# Patient Record
Sex: Female | Born: 1950
Health system: Southern US, Community
[De-identification: ages and names within clinical notes are randomized; demographics above are authoritative.]

## PROBLEM LIST (undated history)

## (undated) DIAGNOSIS — F419 Anxiety disorder, unspecified: Secondary | ICD-10-CM

## (undated) DIAGNOSIS — I10 Essential (primary) hypertension: Secondary | ICD-10-CM

## (undated) DIAGNOSIS — J449 Chronic obstructive pulmonary disease, unspecified: Secondary | ICD-10-CM

## (undated) DIAGNOSIS — R059 Cough, unspecified: Secondary | ICD-10-CM

## (undated) DIAGNOSIS — R011 Cardiac murmur, unspecified: Secondary | ICD-10-CM

## (undated) DIAGNOSIS — I70223 Atherosclerosis of native arteries of extremities with rest pain, bilateral legs: Secondary | ICD-10-CM

## (undated) DIAGNOSIS — E785 Hyperlipidemia, unspecified: Secondary | ICD-10-CM

## (undated) DIAGNOSIS — Z87891 Personal history of nicotine dependence: Secondary | ICD-10-CM

## (undated) DIAGNOSIS — R42 Dizziness and giddiness: Secondary | ICD-10-CM

## (undated) DIAGNOSIS — Z9889 Other specified postprocedural states: Secondary | ICD-10-CM

## (undated) DIAGNOSIS — I4891 Unspecified atrial fibrillation: Secondary | ICD-10-CM

## (undated) DIAGNOSIS — K219 Gastro-esophageal reflux disease without esophagitis: Secondary | ICD-10-CM

## (undated) DIAGNOSIS — I712 Thoracic aortic aneurysm, without rupture, unspecified: Secondary | ICD-10-CM

## (undated) DIAGNOSIS — I441 Atrioventricular block, second degree: Secondary | ICD-10-CM

## (undated) DIAGNOSIS — F32A Depression, unspecified: Secondary | ICD-10-CM

## (undated) DIAGNOSIS — I350 Nonrheumatic aortic (valve) stenosis: Secondary | ICD-10-CM

## (undated) DIAGNOSIS — I6523 Occlusion and stenosis of bilateral carotid arteries: Secondary | ICD-10-CM

## (undated) DIAGNOSIS — I5032 Chronic diastolic (congestive) heart failure: Secondary | ICD-10-CM

## (undated) DIAGNOSIS — I714 Abdominal aortic aneurysm, without rupture, unspecified: Secondary | ICD-10-CM

## (undated) DIAGNOSIS — I739 Peripheral vascular disease, unspecified: Secondary | ICD-10-CM

## (undated) DIAGNOSIS — I471 Supraventricular tachycardia, unspecified: Secondary | ICD-10-CM

## (undated) DIAGNOSIS — R0600 Dyspnea, unspecified: Secondary | ICD-10-CM

## (undated) DIAGNOSIS — I2510 Atherosclerotic heart disease of native coronary artery without angina pectoris: Secondary | ICD-10-CM

## (undated) DIAGNOSIS — R05 Cough: Secondary | ICD-10-CM

## (undated) DIAGNOSIS — Z8679 Personal history of other diseases of the circulatory system: Secondary | ICD-10-CM

## (undated) DIAGNOSIS — K8020 Calculus of gallbladder without cholecystitis without obstruction: Secondary | ICD-10-CM

## (undated) DIAGNOSIS — I4819 Other persistent atrial fibrillation: Secondary | ICD-10-CM

## (undated) DIAGNOSIS — F329 Major depressive disorder, single episode, unspecified: Secondary | ICD-10-CM

## (undated) HISTORY — DX: Hyperlipidemia, unspecified: E78.5

## (undated) HISTORY — DX: Essential (primary) hypertension: I10

## (undated) HISTORY — PX: TUBAL LIGATION: SHX77

## (undated) HISTORY — DX: Personal history of nicotine dependence: Z87.891

## (undated) HISTORY — PX: DILATION AND CURETTAGE OF UTERUS: SHX78

---

## 1968-05-02 HISTORY — PX: BREAST BIOPSY: SHX20

## 1978-05-02 HISTORY — PX: ABDOMINAL HYSTERECTOMY: SHX81

## 2003-03-15 LAB — HM COLONOSCOPY: HM COLON: NORMAL

## 2014-03-14 ENCOUNTER — Ambulatory Visit (INDEPENDENT_AMBULATORY_CARE_PROVIDER_SITE_OTHER): Payer: Medicare Other | Admitting: Internal Medicine

## 2014-03-14 ENCOUNTER — Encounter (INDEPENDENT_AMBULATORY_CARE_PROVIDER_SITE_OTHER): Payer: Self-pay

## 2014-03-14 ENCOUNTER — Encounter: Payer: Self-pay | Admitting: *Deleted

## 2014-03-14 ENCOUNTER — Encounter: Payer: Self-pay | Admitting: Internal Medicine

## 2014-03-14 VITALS — BP 150/90 | HR 66 | Temp 98.3°F | Ht 66.0 in | Wt 148.0 lb

## 2014-03-14 DIAGNOSIS — R1314 Dysphagia, pharyngoesophageal phase: Secondary | ICD-10-CM | POA: Diagnosis not present

## 2014-03-14 DIAGNOSIS — R011 Cardiac murmur, unspecified: Secondary | ICD-10-CM

## 2014-03-14 DIAGNOSIS — R142 Eructation: Secondary | ICD-10-CM | POA: Diagnosis not present

## 2014-03-14 DIAGNOSIS — I10 Essential (primary) hypertension: Secondary | ICD-10-CM | POA: Diagnosis not present

## 2014-03-14 DIAGNOSIS — R109 Unspecified abdominal pain: Secondary | ICD-10-CM

## 2014-03-14 DIAGNOSIS — R030 Elevated blood-pressure reading, without diagnosis of hypertension: Secondary | ICD-10-CM

## 2014-03-14 DIAGNOSIS — R42 Dizziness and giddiness: Secondary | ICD-10-CM

## 2014-03-14 DIAGNOSIS — Z1211 Encounter for screening for malignant neoplasm of colon: Secondary | ICD-10-CM

## 2014-03-14 DIAGNOSIS — IMO0001 Reserved for inherently not codable concepts without codable children: Secondary | ICD-10-CM | POA: Insufficient documentation

## 2014-03-14 DIAGNOSIS — R1310 Dysphagia, unspecified: Secondary | ICD-10-CM | POA: Insufficient documentation

## 2014-03-14 DIAGNOSIS — F17200 Nicotine dependence, unspecified, uncomplicated: Secondary | ICD-10-CM | POA: Diagnosis not present

## 2014-03-14 LAB — POCT URINALYSIS DIPSTICK
Bilirubin, UA: NEGATIVE
Blood, UA: NEGATIVE
Glucose, UA: NEGATIVE
Ketones, UA: NEGATIVE
Leukocytes, UA: NEGATIVE
Nitrite, UA: NEGATIVE
PH UA: 7
PROTEIN UA: NEGATIVE
SPEC GRAV UA: 1.015
UROBILINOGEN UA: 1

## 2014-03-14 LAB — CBC WITH DIFFERENTIAL/PLATELET
BASOS ABS: 0.1 10*3/uL (ref 0.0–0.1)
BASOS PCT: 0.6 % (ref 0.0–3.0)
EOS ABS: 0.2 10*3/uL (ref 0.0–0.7)
Eosinophils Relative: 1.7 % (ref 0.0–5.0)
HCT: 47.2 % — ABNORMAL HIGH (ref 36.0–46.0)
Hemoglobin: 15.6 g/dL — ABNORMAL HIGH (ref 12.0–15.0)
Lymphocytes Relative: 32.8 % (ref 12.0–46.0)
Lymphs Abs: 3.3 10*3/uL (ref 0.7–4.0)
MCHC: 33.1 g/dL (ref 30.0–36.0)
MCV: 91.7 fl (ref 78.0–100.0)
Monocytes Absolute: 0.8 10*3/uL (ref 0.1–1.0)
Monocytes Relative: 7.9 % (ref 3.0–12.0)
NEUTROS PCT: 57 % (ref 43.0–77.0)
Neutro Abs: 5.7 10*3/uL (ref 1.4–7.7)
Platelets: 225 10*3/uL (ref 150.0–400.0)
RBC: 5.15 Mil/uL — ABNORMAL HIGH (ref 3.87–5.11)
RDW: 13.8 % (ref 11.5–15.5)
WBC: 10 10*3/uL (ref 4.0–10.5)

## 2014-03-14 LAB — LIPID PANEL
CHOL/HDL RATIO: 5
Cholesterol: 193 mg/dL (ref 0–200)
HDL: 37.5 mg/dL — AB (ref >39.00)
LDL CALC: 142 mg/dL — AB (ref 0–99)
NonHDL: 155.5
Triglycerides: 67 mg/dL (ref 0.0–149.0)
VLDL: 13.4 mg/dL (ref 0.0–40.0)

## 2014-03-14 LAB — COMPREHENSIVE METABOLIC PANEL
ALBUMIN: 3.4 g/dL — AB (ref 3.5–5.2)
ALK PHOS: 66 U/L (ref 39–117)
ALT: 17 U/L (ref 0–35)
AST: 18 U/L (ref 0–37)
BUN: 10 mg/dL (ref 6–23)
CO2: 28 mEq/L (ref 19–32)
Calcium: 9.1 mg/dL (ref 8.4–10.5)
Chloride: 105 mEq/L (ref 96–112)
Creatinine, Ser: 0.8 mg/dL (ref 0.4–1.2)
GFR: 79.76 mL/min (ref >60.00)
Glucose, Bld: 88 mg/dL (ref 70–99)
POTASSIUM: 4.8 meq/L (ref 3.5–5.1)
Sodium: 140 mEq/L (ref 135–145)
Total Bilirubin: 0.6 mg/dL (ref 0.2–1.2)
Total Protein: 6.4 g/dL (ref 6.0–8.3)

## 2014-03-14 LAB — VITAMIN D 25 HYDROXY (VIT D DEFICIENCY, FRACTURES): VITD: 19.92 ng/mL — ABNORMAL LOW (ref 30.00–100.00)

## 2014-03-14 LAB — TSH: TSH: 3.2 u[IU]/mL (ref 0.35–4.50)

## 2014-03-14 LAB — MICROALBUMIN / CREATININE URINE RATIO
Creatinine,U: 65.6 mg/dL
MICROALB UR: 0.1 mg/dL (ref 0.0–1.9)
Microalb Creat Ratio: 0.2 mg/g (ref 0.0–30.0)

## 2014-03-14 LAB — HEMOGLOBIN A1C: Hgb A1c MFr Bld: 5.7 % (ref 4.6–6.5)

## 2014-03-14 LAB — T4, FREE: Free T4: 0.79 ng/dL (ref 0.60–1.60)

## 2014-03-14 LAB — HM MAMMOGRAPHY

## 2014-03-14 MED ORDER — CENTRUM ADULTS PO TABS: 1.0000 | ORAL_TABLET | Freq: Every day | ORAL | Status: AC

## 2014-03-14 MED ORDER — ASPIRIN EC 81 MG PO TBEC: 81.0000 mg | DELAYED_RELEASE_TABLET | Freq: Every day | ORAL | Status: DC

## 2014-03-14 NOTE — Progress Notes (Signed)
Subjective:    Patient ID: Tracy Bell, female    DOB: December 17, 1950, 63 y.o.   MRN: [id number]  HPI 63YO female presents to establish care.  Concerned about spells. Feels like throat is closing in on her. Feels nauseous and lightheaded. Has belching during these episodes and passes gas. Feels better with belching. These have been ongoing for years. Was evaluated in the past by cardiology, but did not have stress test. Evaluation was normal. No heart racing or chest pain. These events occur sometimes every day, but sometimes less frequently.  Also having some pain in left flank since October. Intense at times. Comes and goes. Sometimes wakes from sleep. No urinary symptoms. No GI symptoms, except for occasional trouble swallowing.  Notes some stress within her marriage.    Review of Systems  Constitutional: Negative for fever, chills, appetite change, fatigue and unexpected weight change.  HENT: Positive for trouble swallowing. Negative for congestion, postnasal drip, sore throat and voice change.   Eyes: Negative for visual disturbance.  Respiratory: Negative for shortness of breath.   Cardiovascular: Negative for chest pain and leg swelling.  Gastrointestinal: Positive for nausea. Negative for vomiting, abdominal pain, diarrhea, constipation, abdominal distention and anal bleeding.  Genitourinary: Positive for flank pain (left). Negative for dysuria, urgency, frequency, hematuria, difficulty urinating and pelvic pain.  Musculoskeletal: Negative for arthralgias.  Skin: Negative for color change and rash.  Neurological: Positive for light-headedness. Negative for tremors, seizures, syncope, weakness and numbness.  Hematological: Negative for adenopathy. Does not bruise/bleed easily.  Psychiatric/Behavioral: Negative for sleep disturbance and dysphoric mood. The patient is nervous/anxious.        Objective:    BP 150/90 mmHg  Pulse 66  Temp(Src) 98.3 F (36.8 C) (Oral)  Ht 5'  6" (1.676 m)  Wt 148 lb (67.132 kg)  BMI 23.90 kg/m2  SpO2 98% Physical Exam  Constitutional: She is oriented to person, place, and time. She appears well-developed and well-nourished. No distress.  HENT:  Head: Normocephalic and atraumatic.  Right Ear: External ear normal.  Left Ear: External ear normal.  Nose: Nose normal.  Mouth/Throat: Oropharynx is clear and moist. No oropharyngeal exudate.  Eyes: Conjunctivae and EOM are normal. Pupils are equal, round, and reactive to light. Right eye exhibits no discharge.  Neck: Normal range of motion. Neck supple. No thyromegaly present.  Cardiovascular: Normal rate, regular rhythm and intact distal pulses.  Exam reveals no gallop and no friction rub.   Murmur (right chest wall) heard.  Systolic murmur is present  Pulmonary/Chest: Effort normal. No respiratory distress. She has no wheezes. She has no rales.  Abdominal: Soft. Bowel sounds are normal. She exhibits no distension and no mass. There is no tenderness. There is no rebound and no guarding.  Musculoskeletal: Normal range of motion. She exhibits no edema or tenderness.  Lymphadenopathy:    She has no cervical adenopathy.  Neurological: She is alert and oriented to person, place, and time. No cranial nerve deficit. Coordination normal.  Skin: Skin is warm and dry. No rash noted. She is not diaphoretic. No erythema. No pallor.  Psychiatric: She has a normal mood and affect. Her behavior is normal. Judgment and thought content normal.          Assessment & Plan:   Problem List Items Addressed This Visit      Unprioritized   Belching    Recurrent episodes of belching. Will set up GI evaluation for upper endoscopy.    Relevant Orders  Ambulatory referral to Gastroenterology   Dysphagia, pharyngoesophageal phase    Noted by pt with both liquids and solids. Will set up GI evaluation for possible endoscopy.    Elevated BP    Check renal function with labs today. Repeat BP  check in 4 weeks.    Heart murmur    Right sided systolic murmur concerning for aortic stenosis. Will set up ECHO.     Relevant Orders      2D Echocardiogram without contrast   Left flank pain    Intermittent left flank pain. Will set up CT abdomen without contrast to look for stone. UA today.    Relevant Orders      POCT Urinalysis Dipstick (Completed)      CT Abdomen Pelvis Wo Contrast   Lightheadedness - Primary    Recent episodes of lightheadness, belching. EKG today. Will check CBC, CMP, TSH with labs. Will set up GI evaluation for possible upper endoscopy given persistent belching and some occasional dysphagia. Follow up in 4 weeks.    Relevant Orders      T4, free      TSH      CBC with Differential      Comprehensive metabolic panel      Lipid panel      Hemoglobin A1c      Microalbumin / creatinine urine ratio      Vit D  25 hydroxy (rtn osteoporosis monitoring)      EKG 12-Lead (Completed)   Special screening for malignant neoplasms, colon   Relevant Orders      Ambulatory referral to Gastroenterology   Tobacco use disorder    Encouraged smoking cessation. Pt not interested in quitting. Will set up screening chest CT to evaluate for lung cancer.    Relevant Orders      CT CHEST LOW DOSE SCREENING W/O CM       Return in about 4 weeks (around 04/11/2014).

## 2014-03-14 NOTE — Assessment & Plan Note (Signed)
Recent episodes of lightheadness, belching. EKG today. Will check CBC, CMP, TSH with labs. Will set up GI evaluation for possible upper endoscopy given persistent belching and some occasional dysphagia. Follow up in 4 weeks.

## 2014-03-14 NOTE — Assessment & Plan Note (Signed)
Intermittent left flank pain. Will set up CT abdomen without contrast to look for stone. UA today.

## 2014-03-14 NOTE — Progress Notes (Signed)
Pre visit review using our clinic review tool, if applicable. No additional management support is needed unless otherwise documented below in the visit note. 

## 2014-03-14 NOTE — Assessment & Plan Note (Signed)
Right sided systolic murmur concerning for aortic stenosis. Will set up ECHO.

## 2014-03-14 NOTE — Assessment & Plan Note (Signed)
Check renal function with labs today. Repeat BP check in 4 weeks.

## 2014-03-14 NOTE — Patient Instructions (Addendum)
Labs today.  We will schedule a CT chest and CT abdomen.  Follow up in 4 weeks.

## 2014-03-14 NOTE — Assessment & Plan Note (Signed)
Noted by pt with both liquids and solids. Will set up GI evaluation for possible endoscopy.

## 2014-03-14 NOTE — Assessment & Plan Note (Signed)
Encouraged smoking cessation. Pt not interested in quitting. Will set up screening chest CT to evaluate for lung cancer.

## 2014-03-14 NOTE — Assessment & Plan Note (Signed)
Recurrent episodes of belching. Will set up GI evaluation for upper endoscopy.

## 2014-03-17 ENCOUNTER — Telehealth: Payer: Self-pay | Admitting: Internal Medicine

## 2014-03-17 NOTE — Telephone Encounter (Signed)
[facility] [place] CT scan called to state that pt called and was unsure about instructions for CT Scan. Colletta Maryland stated that CT chest low dose screening is done only on Wednesday 2p-3p. Colletta Maryland stated that the pt will call the office when she can/msn

## 2014-03-17 NOTE — Telephone Encounter (Signed)
Bonnita Nasuti,   I believe this is a message for you.

## 2014-03-21 ENCOUNTER — Ambulatory Visit: Payer: Self-pay | Admitting: Internal Medicine

## 2014-03-21 ENCOUNTER — Telehealth: Payer: Self-pay | Admitting: Internal Medicine

## 2014-03-21 DIAGNOSIS — N200 Calculus of kidney: Secondary | ICD-10-CM

## 2014-03-21 DIAGNOSIS — R109 Unspecified abdominal pain: Secondary | ICD-10-CM | POA: Diagnosis not present

## 2014-03-21 NOTE — Telephone Encounter (Signed)
CT abdomen showed small bilateral kidney stones that were not obstructing. I would recommend follow up with urology. Does pt have follow up with urology?

## 2014-03-24 NOTE — Telephone Encounter (Signed)
Pt does not have a urologist, Pt would like for [place] to set an apt with a urologist in [place].

## 2014-04-02 ENCOUNTER — Ambulatory Visit: Payer: Self-pay | Admitting: Hospice and Palliative Medicine

## 2014-04-02 DIAGNOSIS — Z122 Encounter for screening for malignant neoplasm of respiratory organs: Secondary | ICD-10-CM | POA: Diagnosis not present

## 2014-04-02 DIAGNOSIS — F17210 Nicotine dependence, cigarettes, uncomplicated: Secondary | ICD-10-CM | POA: Diagnosis not present

## 2014-04-02 DIAGNOSIS — I2510 Atherosclerotic heart disease of native coronary artery without angina pectoris: Secondary | ICD-10-CM | POA: Diagnosis not present

## 2014-04-02 DIAGNOSIS — R911 Solitary pulmonary nodule: Secondary | ICD-10-CM | POA: Diagnosis not present

## 2014-04-02 DIAGNOSIS — I7090 Unspecified atherosclerosis: Secondary | ICD-10-CM | POA: Diagnosis not present

## 2014-04-03 ENCOUNTER — Other Ambulatory Visit (INDEPENDENT_AMBULATORY_CARE_PROVIDER_SITE_OTHER): Payer: Medicare Other

## 2014-04-03 DIAGNOSIS — R011 Cardiac murmur, unspecified: Secondary | ICD-10-CM | POA: Diagnosis not present

## 2014-04-03 DIAGNOSIS — Z720 Tobacco use: Secondary | ICD-10-CM | POA: Diagnosis not present

## 2014-04-03 DIAGNOSIS — N8110 Cystocele, unspecified: Secondary | ICD-10-CM | POA: Diagnosis not present

## 2014-04-03 DIAGNOSIS — N200 Calculus of kidney: Secondary | ICD-10-CM | POA: Diagnosis not present

## 2014-04-03 DIAGNOSIS — I10 Essential (primary) hypertension: Secondary | ICD-10-CM | POA: Diagnosis not present

## 2014-04-03 DIAGNOSIS — N393 Stress incontinence (female) (male): Secondary | ICD-10-CM | POA: Diagnosis not present

## 2014-04-08 ENCOUNTER — Encounter: Payer: Self-pay | Admitting: Internal Medicine

## 2014-04-16 ENCOUNTER — Ambulatory Visit: Payer: Self-pay | Admitting: Internal Medicine

## 2014-04-17 ENCOUNTER — Encounter: Payer: Self-pay | Admitting: Internal Medicine

## 2014-04-17 ENCOUNTER — Ambulatory Visit (INDEPENDENT_AMBULATORY_CARE_PROVIDER_SITE_OTHER): Payer: Medicare Other | Admitting: Internal Medicine

## 2014-04-17 VITALS — BP 135/82 | HR 64 | Temp 97.8°F | Ht 66.0 in | Wt 145.8 lb

## 2014-04-17 DIAGNOSIS — R109 Unspecified abdominal pain: Secondary | ICD-10-CM | POA: Diagnosis not present

## 2014-04-17 DIAGNOSIS — I350 Nonrheumatic aortic (valve) stenosis: Secondary | ICD-10-CM

## 2014-04-17 DIAGNOSIS — I700 Atherosclerosis of aorta: Secondary | ICD-10-CM | POA: Diagnosis not present

## 2014-04-17 DIAGNOSIS — R42 Dizziness and giddiness: Secondary | ICD-10-CM | POA: Diagnosis not present

## 2014-04-17 DIAGNOSIS — R142 Eructation: Secondary | ICD-10-CM | POA: Diagnosis not present

## 2014-04-17 NOTE — Progress Notes (Signed)
Pre visit review using our clinic review tool, if applicable. No additional management support is needed unless otherwise documented below in the visit note. 

## 2014-04-17 NOTE — Assessment & Plan Note (Signed)
Noted on CT abdomen. Encouraged smoking cessation. Continue aspirin. Will consider adding statin at next visit.

## 2014-04-17 NOTE — Progress Notes (Addendum)
Subjective:    Patient ID: Tracy Bell, female    DOB: 15-May-1950, 63 y.o.   MRN: [id number]  HPI 64YO female presents for followup.  Left flank pain - only one episode of pain since last visit. Mild in nature. CT abdomen showed two 42mm kidney stone. Seen by urology. No interventions planned.CT also showed 2cm gallstone. She denies right sided abdominal pain, nausea.   Continues to have episodes of lightheadedness and belching.Notes "tightening in her neck" during these episodes. Sometimes lies down on the floor because of lightheadedness. No chest pain, dyspnea noted. ECHO showed mild aortic stenosis.  Has scheduled evaluation for colonoscopy next month.    Past medical, surgical, family and social history per today's encounter.  Review of Systems  Constitutional: Positive for fatigue. Negative for fever, chills, appetite change and unexpected weight change.  Eyes: Negative for visual disturbance.  Respiratory: Negative for shortness of breath.   Cardiovascular: Negative for chest pain and leg swelling.  Gastrointestinal: Positive for abdominal distention (with belching). Negative for nausea, vomiting, abdominal pain, diarrhea and constipation.  Skin: Negative for color change and rash.  Neurological: Positive for light-headedness.  Hematological: Negative for adenopathy. Does not bruise/bleed easily.  Psychiatric/Behavioral: Negative for dysphoric mood. The patient is not nervous/anxious.        Objective:    BP 135/82 mmHg  Pulse 64  Temp(Src) 97.8 F (36.6 C) (Oral)  Ht 5\' 6"  (1.676 m)  Wt 145 lb 12 oz (66.112 kg)  BMI 23.54 kg/m2  SpO2 95% Physical Exam  Constitutional: She is oriented to person, place, and time. She appears well-developed and well-nourished. No distress.  HENT:  Head: Normocephalic and atraumatic.  Right Ear: External ear normal.  Left Ear: External ear normal.  Nose: Nose normal.  Mouth/Throat: Oropharynx is clear and moist. No  oropharyngeal exudate.  Eyes: Conjunctivae are normal. Pupils are equal, round, and reactive to light. Right eye exhibits no discharge. Left eye exhibits no discharge. No scleral icterus.  Neck: Normal range of motion. Neck supple. No tracheal deviation present. No thyromegaly present.  Cardiovascular: Normal rate, regular rhythm and intact distal pulses.  Exam reveals no gallop and no friction rub.   Murmur heard.  Systolic murmur is present  Pulmonary/Chest: Effort normal and breath sounds normal. No accessory muscle usage. No tachypnea. No respiratory distress. She has no decreased breath sounds. She has no wheezes. She has no rhonchi. She has no rales. She exhibits no tenderness.  Abdominal: Soft. Bowel sounds are normal. She exhibits no distension and no mass. There is no tenderness. There is no rebound and no guarding.  Musculoskeletal: Normal range of motion. She exhibits no edema or tenderness.  Lymphadenopathy:    She has no cervical adenopathy.  Neurological: She is alert and oriented to person, place, and time. No cranial nerve deficit. She exhibits normal muscle tone. Coordination normal.  Skin: Skin is warm and dry. No rash noted. She is not diaphoretic. No erythema. No pallor.  Psychiatric: She has a normal mood and affect. Her behavior is normal. Judgment and thought content normal.          Assessment & Plan:  Over 7min of which >50% spent in face-to-face contact with patient discussing plan of care  Problem List Items Addressed This Visit      Unprioritized   Aortic stenosis, mild    Mild aortic stenosis noted on recent ECHO. Will monitor with repeat ECHO in 3-5 years. Monitor BP. Continue aspirin. Consider adding statin  medication next visit, as calculated 10 year risk of CAD based on current guidelines and recent cholesterol values 10.6%.    Atherosclerosis of aorta    Noted on CT abdomen. Encouraged smoking cessation. Continue aspirin. Will consider adding statin at  next visit.    Belching    Recent increased belching and abdominal distension. CT abdomen remarkable only for 2cm gallstone, but no inflammation seen. GI evaluation pending. Will check H. Pylori breath test with labs today.    Relevant Orders      H. pylori breath test   Left flank pain    Previous left flank pain. 76mm bilateral renal stones seen on CT. Not likely cause of pain. Pain has improved somewhat, so suspect musculoskeletal cause. Will continue to monitor.    Lightheadedness - Primary    Intermittent lightheadedness. Labs including CBC, CMP, thyroid function were normal. ECHO showed mild aortic stenosis. Will set up carotid dopplers for further evaluation.    Relevant Orders      US Carotid Duplex Bilateral       Return in about 4 weeks (around 05/15/2014) for Recheck.

## 2014-04-17 NOTE — Assessment & Plan Note (Signed)
Intermittent lightheadedness. Labs including CBC, CMP, thyroid function were normal. ECHO showed mild aortic stenosis. Will set up carotid dopplers for further evaluation.

## 2014-04-17 NOTE — Assessment & Plan Note (Signed)
Previous left flank pain. 20mm bilateral renal stones seen on CT. Not likely cause of pain. Pain has improved somewhat, so suspect musculoskeletal cause. Will continue to monitor.

## 2014-04-17 NOTE — Patient Instructions (Addendum)
We will set up evaluation with ultrasound of your carotid arteries.  Follow up in 4 weeks or sooner as needed.

## 2014-04-17 NOTE — Assessment & Plan Note (Addendum)
Mild aortic stenosis noted on recent ECHO. Will monitor with repeat ECHO in 3-5 years. Monitor BP. Continue aspirin. Consider adding statin medication next visit, as calculated 10 year risk of CAD based on current guidelines and recent cholesterol values 10.6%.

## 2014-04-17 NOTE — Assessment & Plan Note (Signed)
Recent increased belching and abdominal distension. CT abdomen remarkable only for 2cm gallstone, but no inflammation seen. GI evaluation pending. Will check H. Pylori breath test with labs today.

## 2014-04-18 ENCOUNTER — Telehealth: Payer: Self-pay | Admitting: Internal Medicine

## 2014-04-18 ENCOUNTER — Other Ambulatory Visit: Payer: Self-pay | Admitting: *Deleted

## 2014-04-18 LAB — H. PYLORI BREATH TEST: H. pylori Breath Test: DETECTED — AB

## 2014-04-18 MED ORDER — AMOXICILL-CLARITHRO-LANSOPRAZ PO MISC: Freq: Two times a day (BID) | ORAL | Status: DC

## 2014-04-18 NOTE — Telephone Encounter (Signed)
emmi mailed  °

## 2014-05-13 DIAGNOSIS — R1314 Dysphagia, pharyngoesophageal phase: Secondary | ICD-10-CM | POA: Diagnosis not present

## 2014-05-13 DIAGNOSIS — R0789 Other chest pain: Secondary | ICD-10-CM | POA: Diagnosis not present

## 2014-05-22 ENCOUNTER — Ambulatory Visit (INDEPENDENT_AMBULATORY_CARE_PROVIDER_SITE_OTHER): Payer: Medicare Other | Admitting: Internal Medicine

## 2014-05-22 ENCOUNTER — Encounter: Payer: Self-pay | Admitting: Internal Medicine

## 2014-05-22 VITALS — BP 149/89 | HR 60 | Temp 98.2°F | Ht 66.0 in | Wt 146.5 lb

## 2014-05-22 DIAGNOSIS — B9681 Helicobacter pylori [H. pylori] as the cause of diseases classified elsewhere: Secondary | ICD-10-CM | POA: Diagnosis not present

## 2014-05-22 DIAGNOSIS — I10 Essential (primary) hypertension: Secondary | ICD-10-CM | POA: Diagnosis not present

## 2014-05-22 DIAGNOSIS — A048 Other specified bacterial intestinal infections: Secondary | ICD-10-CM

## 2014-05-22 DIAGNOSIS — R1314 Dysphagia, pharyngoesophageal phase: Secondary | ICD-10-CM

## 2014-05-22 DIAGNOSIS — R42 Dizziness and giddiness: Secondary | ICD-10-CM | POA: Diagnosis not present

## 2014-05-22 NOTE — Progress Notes (Signed)
Subjective:    Patient ID: Tracy Bell, female    DOB: [birth year], 64 y.o.   MRN: [id number]  HPI 64YO female presents for follow up.  Last seen 12/17 with lightheadedness, belching. Referred to GI. Recommended EGD, she declined.  Continues to have "spells" of lightheadedness. Occur at random times throughout day. Brief, fleeting. Notes increased anxiety with her marriage stressors. Prefers not to take medication for this. Has talked to counselors in the past with no improvement.  Completed Prevpac for H Pylori. No current symptoms.  BP Readings from Last 3 Encounters:  05/22/14 149/89  04/17/14 135/82  03/14/14 150/90     Past medical, surgical, family and social history per today's encounter.  Review of Systems  Constitutional: Positive for fatigue. Negative for fever, chills, appetite change and unexpected weight change.  HENT: Negative for trouble swallowing.   Eyes: Negative for visual disturbance.  Respiratory: Negative for shortness of breath.   Cardiovascular: Negative for chest pain and leg swelling.  Gastrointestinal: Negative for nausea, vomiting, abdominal pain, diarrhea and constipation.  Skin: Negative for color change and rash.  Neurological: Positive for light-headedness.  Hematological: Negative for adenopathy. Does not bruise/bleed easily.  Psychiatric/Behavioral: Positive for dysphoric mood. Negative for sleep disturbance. The patient is nervous/anxious.        Objective:    BP 149/89 mmHg  Pulse 60  Temp(Src) 98.2 F (36.8 C) (Oral)  Ht 5\' 6"  (1.676 m)  Wt 146 lb 8 oz (66.452 kg)  BMI 23.66 kg/m2  SpO2 97% Physical Exam  Constitutional: She is oriented to person, place, and time. She appears well-developed and well-nourished. No distress.  HENT:  Head: Normocephalic and atraumatic.  Right Ear: External ear normal.  Left Ear: External ear normal.  Nose: Nose normal.  Mouth/Throat: Oropharynx is clear and moist. No oropharyngeal exudate.    Eyes: Conjunctivae are normal. Pupils are equal, round, and reactive to light. Right eye exhibits no discharge. Left eye exhibits no discharge. No scleral icterus.  Neck: Normal range of motion. Neck supple. No tracheal deviation present. No thyromegaly present.  Cardiovascular: Normal rate, regular rhythm, normal heart sounds and intact distal pulses.  Exam reveals no gallop and no friction rub.   No murmur heard. Pulmonary/Chest: Effort normal and breath sounds normal. No accessory muscle usage. No tachypnea. No respiratory distress. She has no decreased breath sounds. She has no wheezes. She has no rhonchi. She has no rales. She exhibits no tenderness.  Musculoskeletal: Normal range of motion. She exhibits no edema or tenderness.  Lymphadenopathy:    She has no cervical adenopathy.  Neurological: She is alert and oriented to person, place, and time. No cranial nerve deficit. She exhibits normal muscle tone. Coordination normal.  Skin: Skin is warm and dry. No rash noted. She is not diaphoretic. No erythema. No pallor.  Psychiatric: Her behavior is normal. Judgment and thought content normal. Her mood appears anxious.          Assessment & Plan:   Problem List Items Addressed This Visit      Unprioritized   Dysphagia, pharyngoesophageal phase - Primary    Reviewed notes from GI physician who recommended EGD. Pt declined. Currently no symptoms of dysphagia.      Essential hypertension    BP Readings from Last 3 Encounters:  05/22/14 149/89  04/17/14 135/82  03/14/14 150/90   BP slightly elevated today and has been on previous occasions. Pt reluctant to start new medications. Will continue to monitor for  now.      Lightheadedness    Intermittent lightheadedness. Workup including labs and cardiac ECHO normal. Carotid doppler ordered and pending. Suspect anxiety playing a role. Recommended treatment for anxiety including SSRI and possible short acting benzodiazepine for acute  episodes. Pt declines.      Positive H. pylori test    Recommended follow up breath test for H. Pylori. Pt declines for now. Will come in later this month for this test.      Relevant Orders   H. pylori breath test       Return in about 3 months (around 08/21/2014) for Recheck.

## 2014-05-22 NOTE — Assessment & Plan Note (Signed)
Intermittent lightheadedness. Workup including labs and cardiac ECHO normal. Carotid doppler ordered and pending. Suspect anxiety playing a role. Recommended treatment for anxiety including SSRI and possible short acting benzodiazepine for acute episodes. Pt declines.

## 2014-05-22 NOTE — Assessment & Plan Note (Signed)
Reviewed notes from GI physician who recommended EGD. Pt declined. Currently no symptoms of dysphagia.

## 2014-05-22 NOTE — Progress Notes (Signed)
Pre visit review using our clinic review tool, if applicable. No additional management support is needed unless otherwise documented below in the visit note. 

## 2014-05-22 NOTE — Patient Instructions (Signed)
Please return for a breath test for H. Pylori in the next few weeks.  Follow up in 3 months.

## 2014-05-22 NOTE — Assessment & Plan Note (Signed)
Recommended follow up breath test for H. Pylori. Pt declines for now. Will come in later this month for this test.

## 2014-05-22 NOTE — Assessment & Plan Note (Signed)
BP Readings from Last 3 Encounters:  05/22/14 149/89  04/17/14 135/82  03/14/14 150/90   BP slightly elevated today and has been on previous occasions. Pt reluctant to start new medications. Will continue to monitor for now.

## 2014-09-25 ENCOUNTER — Ambulatory Visit: Payer: Self-pay | Admitting: Internal Medicine

## 2014-09-30 ENCOUNTER — Ambulatory Visit (INDEPENDENT_AMBULATORY_CARE_PROVIDER_SITE_OTHER): Payer: Medicare Other | Admitting: Internal Medicine

## 2014-09-30 ENCOUNTER — Encounter: Payer: Self-pay | Admitting: Internal Medicine

## 2014-09-30 VITALS — BP 134/90 | HR 65 | Temp 98.5°F | Ht 66.0 in | Wt 138.0 lb

## 2014-09-30 DIAGNOSIS — F4323 Adjustment disorder with mixed anxiety and depressed mood: Secondary | ICD-10-CM | POA: Diagnosis not present

## 2014-09-30 DIAGNOSIS — R42 Dizziness and giddiness: Secondary | ICD-10-CM | POA: Diagnosis not present

## 2014-09-30 DIAGNOSIS — B9681 Helicobacter pylori [H. pylori] as the cause of diseases classified elsewhere: Secondary | ICD-10-CM

## 2014-09-30 DIAGNOSIS — R1314 Dysphagia, pharyngoesophageal phase: Secondary | ICD-10-CM

## 2014-09-30 DIAGNOSIS — A048 Other specified bacterial intestinal infections: Secondary | ICD-10-CM

## 2014-09-30 DIAGNOSIS — F17200 Nicotine dependence, unspecified, uncomplicated: Secondary | ICD-10-CM

## 2014-09-30 DIAGNOSIS — F17210 Nicotine dependence, cigarettes, uncomplicated: Secondary | ICD-10-CM

## 2014-09-30 DIAGNOSIS — I10 Essential (primary) hypertension: Secondary | ICD-10-CM | POA: Diagnosis not present

## 2014-09-30 NOTE — Patient Instructions (Addendum)
Try cutting back to one pack of cigarettes or less per day.  We will set up CT chest for evaluation, screening for lung cancer.  We will also set up carotid dopplers.

## 2014-09-30 NOTE — Progress Notes (Signed)
Subjective:    Patient ID: Tracy Bell, female    DOB: 1950-10-22, 64 y.o.   MRN: [id number]  HPI  64YO female presents for follow up.  Continues to have stressful situation at home caring for husband. However, sleeping well. Not interested in counseling or medication for this.  Continues to have lightheadedness on occasion. Improved with lying down. This has been ongoing for a few years without change. No chest pain, dyspnea. ECHO showed normal LV function. She had declined carotid dopplers in the past.  Sister was just diagnosed with lung cancer. She has tried to quit in the past. Currently smoking about 1-2 packs per day. Not interested in quitting.  She continues to have dysphagia with both solids and liquids. She has refused endoscopy. No NV.   Past medical, surgical, family and social history per today's encounter.  Review of Systems  Constitutional: Negative for fever, chills, appetite change, fatigue and unexpected weight change.  HENT: Positive for trouble swallowing.   Eyes: Negative for visual disturbance.  Respiratory: Negative for cough, chest tightness and shortness of breath.   Cardiovascular: Negative for chest pain and leg swelling.  Gastrointestinal: Negative for nausea, vomiting, abdominal pain, diarrhea and constipation.  Skin: Negative for color change and rash.  Neurological: Positive for dizziness and light-headedness. Negative for tremors, seizures, syncope, facial asymmetry, speech difficulty, weakness and headaches.  Hematological: Negative for adenopathy. Does not bruise/bleed easily.  Psychiatric/Behavioral: Negative for dysphoric mood. The patient is not nervous/anxious.        Objective:    BP 134/90 mmHg  Pulse 65  Temp(Src) 98.5 F (36.9 C) (Oral)  Ht 5\' 6"  (1.676 m)  Wt 138 lb (62.596 kg)  BMI 22.28 kg/m2  SpO2 96% Physical Exam  Constitutional: She is oriented to person, place, and time. She appears well-developed and  well-nourished. No distress.  HENT:  Head: Normocephalic and atraumatic.  Right Ear: External ear normal.  Left Ear: External ear normal.  Nose: Nose normal.  Mouth/Throat: Oropharynx is clear and moist. No oropharyngeal exudate.  Eyes: Conjunctivae are normal. Pupils are equal, round, and reactive to light. Right eye exhibits no discharge. Left eye exhibits no discharge. No scleral icterus.  Neck: Trachea normal and normal range of motion. Neck supple. Carotid bruit is not present. No tracheal deviation present. No thyromegaly present.  Cardiovascular: Normal rate, regular rhythm, normal heart sounds and intact distal pulses.  Exam reveals no gallop and no friction rub.   No murmur heard. Pulmonary/Chest: Effort normal and breath sounds normal. No respiratory distress. She has no wheezes. She has no rales. She exhibits no tenderness.  Musculoskeletal: Normal range of motion. She exhibits no edema or tenderness.  Lymphadenopathy:    She has no cervical adenopathy.  Neurological: She is alert and oriented to person, place, and time. No cranial nerve deficit. She exhibits normal muscle tone. Coordination normal.  Skin: Skin is warm and dry. No rash noted. She is not diaphoretic. No erythema. No pallor.  Psychiatric: She has a normal mood and affect. Her behavior is normal. Judgment and thought content normal.          Assessment & Plan:   Problem List Items Addressed This Visit      Unprioritized   Adjustment disorder with mixed anxiety and depressed mood - Primary    Encouraged her to consider counseling for herself regarding ongoing issues with her husband. She declines for now.      Dysphagia, pharyngoesophageal phase  Again, encouraged her to follow up with Dr. Rayann Heman for possible EGD. She will call to schedule.      Essential hypertension    BP Readings from Last 3 Encounters:  09/30/14 134/90  05/22/14 149/89  04/17/14 135/82   BP continues to be slightly elevated. She  has declined anti-hypertensive medication. Will continue to monitor.      Lightheadedness    Intermittent lightheadedness. Workup including labs and ECHO were normal. She had declined carotid doppler in the past, but is willing to schedule today. Will order.       Positive H. pylori test    Repeat testing for H. Pylori pending.      Tobacco use disorder    Strongly encouraged smoking cessation. She is not interested in quitting. Made plan to limit cigarettes to 1 PPD or less. Approximately 5 min spent encouraging smoking cessation and discussing plan.          Return in about 3 months (around 12/31/2014) for Recheck.

## 2014-09-30 NOTE — Progress Notes (Signed)
Pre visit review using our clinic review tool, if applicable. No additional management support is needed unless otherwise documented below in the visit note. 

## 2014-09-30 NOTE — Assessment & Plan Note (Addendum)
Strongly encouraged smoking cessation. She is not interested in quitting. Made plan to limit cigarettes to 1 PPD or less. Approximately 5 min spent encouraging smoking cessation and discussing plan.

## 2014-09-30 NOTE — Assessment & Plan Note (Signed)
Encouraged her to consider counseling for herself regarding ongoing issues with her husband. She declines for now.

## 2014-09-30 NOTE — Assessment & Plan Note (Signed)
Intermittent lightheadedness. Workup including labs and ECHO were normal. She had declined carotid doppler in the past, but is willing to schedule today. Will order.

## 2014-09-30 NOTE — Assessment & Plan Note (Signed)
BP Readings from Last 3 Encounters:  09/30/14 134/90  05/22/14 149/89  04/17/14 135/82   BP continues to be slightly elevated. She has declined anti-hypertensive medication. Will continue to monitor.

## 2014-09-30 NOTE — Assessment & Plan Note (Signed)
Again, encouraged her to follow up with Dr. Rayann Heman for possible EGD. She will call to schedule.

## 2014-09-30 NOTE — Assessment & Plan Note (Signed)
Repeat testing for H. Pylori pending.

## 2014-10-01 ENCOUNTER — Other Ambulatory Visit: Payer: Self-pay | Admitting: *Deleted

## 2014-10-01 LAB — H. PYLORI BREATH TEST: H. pylori Breath Test: DETECTED — AB

## 2014-10-01 MED ORDER — LEVOFLOXACIN 250 MG PO TABS: 250.0000 mg | ORAL_TABLET | Freq: Two times a day (BID) | ORAL | Status: DC

## 2014-10-01 MED ORDER — PANTOPRAZOLE SODIUM 40 MG PO TBEC: 40.0000 mg | DELAYED_RELEASE_TABLET | Freq: Two times a day (BID) | ORAL | Status: DC

## 2014-10-01 MED ORDER — AMOXICILLIN 500 MG PO TABS: 1000.0000 mg | ORAL_TABLET | Freq: Two times a day (BID) | ORAL | Status: DC

## 2014-11-24 ENCOUNTER — Telehealth: Payer: Self-pay | Admitting: Internal Medicine

## 2014-11-24 ENCOUNTER — Emergency Department
Admission: EM | Admit: 2014-11-24 | Discharge: 2014-11-24 | Disposition: A | Discharge status: Home / Self Care | Payer: Medicare Other | Attending: Emergency Medicine | Admitting: Emergency Medicine

## 2014-11-24 ENCOUNTER — Encounter: Payer: Self-pay | Admitting: Emergency Medicine

## 2014-11-24 DIAGNOSIS — T24132A Burn of first degree of left lower leg, initial encounter: Secondary | ICD-10-CM | POA: Diagnosis not present

## 2014-11-24 DIAGNOSIS — T23101A Burn of first degree of right hand, unspecified site, initial encounter: Secondary | ICD-10-CM | POA: Diagnosis not present

## 2014-11-24 DIAGNOSIS — Z720 Tobacco use: Secondary | ICD-10-CM | POA: Insufficient documentation

## 2014-11-24 DIAGNOSIS — T24101A Burn of first degree of unspecified site of right lower limb, except ankle and foot, initial encounter: Secondary | ICD-10-CM | POA: Diagnosis not present

## 2014-11-24 DIAGNOSIS — T23161A Burn of first degree of back of right hand, initial encounter: Secondary | ICD-10-CM | POA: Insufficient documentation

## 2014-11-24 DIAGNOSIS — T24102A Burn of first degree of unspecified site of left lower limb, except ankle and foot, initial encounter: Secondary | ICD-10-CM | POA: Diagnosis not present

## 2014-11-24 DIAGNOSIS — Y998 Other external cause status: Secondary | ICD-10-CM | POA: Diagnosis not present

## 2014-11-24 DIAGNOSIS — Z7982 Long term (current) use of aspirin: Secondary | ICD-10-CM | POA: Insufficient documentation

## 2014-11-24 DIAGNOSIS — Y9289 Other specified places as the place of occurrence of the external cause: Secondary | ICD-10-CM | POA: Diagnosis not present

## 2014-11-24 DIAGNOSIS — Y9389 Activity, other specified: Secondary | ICD-10-CM | POA: Diagnosis not present

## 2014-11-24 DIAGNOSIS — I10 Essential (primary) hypertension: Secondary | ICD-10-CM | POA: Diagnosis not present

## 2014-11-24 DIAGNOSIS — T2010XA Burn of first degree of head, face, and neck, unspecified site, initial encounter: Secondary | ICD-10-CM | POA: Diagnosis not present

## 2014-11-24 DIAGNOSIS — X04XXXA Exposure to ignition of highly flammable material, initial encounter: Secondary | ICD-10-CM | POA: Insufficient documentation

## 2014-11-24 DIAGNOSIS — T2000XA Burn of unspecified degree of head, face, and neck, unspecified site, initial encounter: Secondary | ICD-10-CM | POA: Diagnosis present

## 2014-11-24 DIAGNOSIS — T24131A Burn of first degree of right lower leg, initial encounter: Secondary | ICD-10-CM | POA: Diagnosis not present

## 2014-11-24 DIAGNOSIS — T23162A Burn of first degree of back of left hand, initial encounter: Secondary | ICD-10-CM | POA: Diagnosis not present

## 2014-11-24 DIAGNOSIS — Z79899 Other long term (current) drug therapy: Secondary | ICD-10-CM | POA: Insufficient documentation

## 2014-11-24 DIAGNOSIS — T300 Burn of unspecified body region, unspecified degree: Secondary | ICD-10-CM

## 2014-11-24 DIAGNOSIS — Z792 Long term (current) use of antibiotics: Secondary | ICD-10-CM | POA: Diagnosis not present

## 2014-11-24 DIAGNOSIS — T23102A Burn of first degree of left hand, unspecified site, initial encounter: Secondary | ICD-10-CM | POA: Diagnosis not present

## 2014-11-24 MED ORDER — TRAMADOL HCL 50 MG PO TABS: 50.0000 mg | ORAL_TABLET | Freq: Four times a day (QID) | ORAL | Status: DC | PRN

## 2014-11-24 MED ORDER — SULFAMETHOXAZOLE-TRIMETHOPRIM 800-160 MG PO TABS: 1.0000 | ORAL_TABLET | Freq: Two times a day (BID) | ORAL | Status: DC

## 2014-11-24 MED ORDER — SILVER SULFADIAZINE 1 % EX CREA
TOPICAL_CREAM | Freq: Once | CUTANEOUS | Status: AC
  Administered 2014-11-24: 14:00:00 via TOPICAL
  Filled 2014-11-24: qty 85

## 2014-11-24 MED ORDER — TRAMADOL HCL 50 MG PO TABS
50.0000 mg | ORAL_TABLET | Freq: Once | ORAL | Status: DC
  Filled 2014-11-24: qty 1

## 2014-11-24 MED ORDER — BACITRACIN-NEOMYCIN-POLYMYXIN 400-5-5000 EX OINT
TOPICAL_OINTMENT | Freq: Once | CUTANEOUS | Status: DC
  Filled 2014-11-24: qty 1

## 2014-11-24 MED ORDER — SILVER SULFADIAZINE 1 % EX CREA: TOPICAL_CREAM | CUTANEOUS | Status: DC
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
## 2014-11-24 NOTE — Discharge Instructions (Signed)
Burn Care °Burns hurt your skin. When your skin is hurt, it is easier to get an infection. Follow your doctor's directions to help prevent an infection. °HOME CARE °· Wash your hands well before you change your bandage. °· Change your bandage as often as told by your doctor. °¨ Remove the old bandage. If the bandage sticks, soak it off with cool, clean water. °¨ Gently clean the burn with mild soap and water. °¨ Pat the burn dry with a clean, dry cloth. °¨ Put a thin layer of medicated cream on the burn. °¨ Put a clean bandage on as told by your doctor. °¨ Keep the bandage clean and dry. °· Raise (elevate) the burn for the first 24 hours. After that, follow your doctor's directions. °· Only take medicine as told by your doctor. °GET HELP RIGHT AWAY IF:  °· You have too much pain. °· The skin near the burn is red, tender, puffy (swollen), or has red streaks. °· The burn area has yellowish white fluid (pus) or a bad smell coming from it. °· You have a fever. °MAKE SURE YOU:  °· Understand these instructions. °· Will watch your condition. °· Will get help right away if you are not doing well or get worse. °Document Released: 01/26/2008 Document Revised: 07/11/2011 Document Reviewed: 09/08/2010 °ExitCare® Patient Information ©2015 [employer]. This information is not intended to replace advice given to you by your health care provider. Make sure you discuss any questions you have with your health care provider. ° °

## 2014-11-24 NOTE — Telephone Encounter (Signed)
Advised pt, Pt states that she does not know where [facility] and does not want to go there.  Advised pt to go to [facility]

## 2014-11-24 NOTE — ED Notes (Signed)
Right arm cleansed with saline  ssd applied with non stick dressing
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
## 2014-11-24 NOTE — Telephone Encounter (Signed)
Please see below note
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
## 2014-12-02 DIAGNOSIS — Z23 Encounter for immunization: Secondary | ICD-10-CM | POA: Diagnosis not present

## 2014-12-02 DIAGNOSIS — T2220XA Burn of second degree of shoulder and upper limb, except wrist and hand, unspecified site, initial encounter: Secondary | ICD-10-CM | POA: Diagnosis not present

## 2014-12-02 DIAGNOSIS — T24102A Burn of first degree of unspecified site of left lower limb, except ankle and foot, initial encounter: Secondary | ICD-10-CM | POA: Diagnosis not present

## 2014-12-02 DIAGNOSIS — T2210XA Burn of first degree of shoulder and upper limb, except wrist and hand, unspecified site, initial encounter: Secondary | ICD-10-CM | POA: Diagnosis not present

## 2014-12-02 DIAGNOSIS — T24101A Burn of first degree of unspecified site of right lower limb, except ankle and foot, initial encounter: Secondary | ICD-10-CM | POA: Diagnosis not present

## 2014-12-26 DIAGNOSIS — Z1211 Encounter for screening for malignant neoplasm of colon: Secondary | ICD-10-CM | POA: Diagnosis not present

## 2014-12-26 DIAGNOSIS — R197 Diarrhea, unspecified: Secondary | ICD-10-CM | POA: Diagnosis not present

## 2014-12-26 DIAGNOSIS — K219 Gastro-esophageal reflux disease without esophagitis: Secondary | ICD-10-CM | POA: Diagnosis not present

## 2014-12-29 DIAGNOSIS — R197 Diarrhea, unspecified: Secondary | ICD-10-CM | POA: Diagnosis not present

## 2014-12-30 ENCOUNTER — Telehealth: Payer: Self-pay | Admitting: *Deleted

## 2014-12-30 ENCOUNTER — Ambulatory Visit (INDEPENDENT_AMBULATORY_CARE_PROVIDER_SITE_OTHER): Payer: Medicare Other | Admitting: Internal Medicine

## 2014-12-30 ENCOUNTER — Ambulatory Visit
Admission: RE | Admit: 2014-12-30 | Discharge: 2014-12-30 | Disposition: A | Discharge status: Home / Self Care | Payer: Medicare Other | Source: Ambulatory Visit | Attending: Internal Medicine | Admitting: Internal Medicine

## 2014-12-30 ENCOUNTER — Encounter: Payer: Self-pay | Admitting: Internal Medicine

## 2014-12-30 VITALS — BP 150/89 | HR 63 | Temp 98.5°F | Ht 66.0 in | Wt 137.1 lb

## 2014-12-30 DIAGNOSIS — I10 Essential (primary) hypertension: Secondary | ICD-10-CM | POA: Diagnosis not present

## 2014-12-30 DIAGNOSIS — K760 Fatty (change of) liver, not elsewhere classified: Secondary | ICD-10-CM | POA: Diagnosis not present

## 2014-12-30 DIAGNOSIS — R1011 Right upper quadrant pain: Secondary | ICD-10-CM | POA: Insufficient documentation

## 2014-12-30 DIAGNOSIS — Z720 Tobacco use: Secondary | ICD-10-CM | POA: Diagnosis not present

## 2014-12-30 DIAGNOSIS — R1031 Right lower quadrant pain: Secondary | ICD-10-CM

## 2014-12-30 DIAGNOSIS — Z139 Encounter for screening, unspecified: Secondary | ICD-10-CM

## 2014-12-30 DIAGNOSIS — K8020 Calculus of gallbladder without cholecystitis without obstruction: Secondary | ICD-10-CM | POA: Insufficient documentation

## 2014-12-30 DIAGNOSIS — F17200 Nicotine dependence, unspecified, uncomplicated: Secondary | ICD-10-CM

## 2014-12-30 LAB — POCT URINALYSIS DIPSTICK
BILIRUBIN UA: NEGATIVE
Blood, UA: NEGATIVE
Glucose, UA: NEGATIVE
KETONES UA: NEGATIVE
LEUKOCYTES UA: NEGATIVE
NITRITE UA: NEGATIVE
PH UA: 7
Protein, UA: NEGATIVE
Spec Grav, UA: 1.02
Urobilinogen, UA: 0.2

## 2014-12-30 MED ORDER — IOHEXOL 240 MG/ML SOLN: 50.0000 mL | Freq: Once | INTRAMUSCULAR | Status: DC | PRN

## 2014-12-30 MED ORDER — IOHEXOL 300 MG/ML  SOLN
100.0000 mL | Freq: Once | INTRAMUSCULAR | Status: AC | PRN
  Administered 2014-12-30: 100 mL via INTRAVENOUS

## 2014-12-30 NOTE — Assessment & Plan Note (Addendum)
Right lower abdominal pain. Tenderness on exam at McBurney's point. Will get STAT CT abdomen to evaluate for appendicitis. UA normal today. Follow up after CT complete. Note that renal function completed at [facility] 8/26 showed normal WBC and normal Cr of 0.8.

## 2014-12-30 NOTE — Progress Notes (Signed)
Pre visit review using our clinic review tool, if applicable. No additional management support is needed unless otherwise documented below in the visit note. 

## 2014-12-30 NOTE — Telephone Encounter (Signed)
Contacted patient to review results of CT screening scan done 04/02/14. Reviewed scan and answered questions to the satisfaction of patient. Also informed patient that I will be contacting her closer to December 2016 to schedule annual lung cancer screening scan. Patient verbalized understanding and thanked me for calling.

## 2014-12-30 NOTE — Progress Notes (Addendum)
Subjective:    Patient ID: Tracy Bell, female    DOB: March 28, 1951, 64 y.o.   MRN: [id number]  HPI  64YO female presents for acute visit.  Abdominal pain -  Right lower abdominal pain started last Friday. Constant, dull pain. Abdomen is tender to touch.  Also some lower back aching pain. Change in BMs with looser stools and narrower caliber stool. Few episodes of mucous diarrhea last week. No blood in stool or black stool. Mild nausea, no vomiting. Not taking anything for this.   No past medical history on file. Family History  Problem Relation Age of Onset  . Cancer Mother     Pancreatic Cancer   . Cancer Father     Stomach Cancer   . Cancer Sister   . Cancer Sister     ovarian   Past Surgical History  Procedure Laterality Date  . Abdominal hysterectomy  1980  . Breast biopsy      normal   Social History   Social History  . Marital Status: Married    Spouse Name: N/A  . Number of Children: N/A  . Years of Education: N/A   Social History Main Topics  . Smoking status: Current Some Day Smoker  . Smokeless tobacco: None  . Alcohol Use: No  . Drug Use: No  . Sexual Activity: Not Asked   Other Topics Concern  . None   Social History Narrative   Lives with husband. Has 11 pets in home. Has one son, lives locally.   Was born and raised in [place].   Husband is older than her and she provides care for him.      Work - retired. Invented water bottle for pets    Review of Systems  Constitutional: Negative for fever, chills, appetite change, fatigue and unexpected weight change.  Eyes: Negative for visual disturbance.  Respiratory: Negative for shortness of breath.   Cardiovascular: Negative for chest pain and leg swelling.  Gastrointestinal: Positive for nausea and diarrhea. Negative for vomiting, abdominal pain, constipation, blood in stool, abdominal distention, anal bleeding and rectal pain.  Genitourinary: Negative for dysuria, urgency, hematuria and  flank pain.  Musculoskeletal: Positive for myalgias, back pain and arthralgias.  Skin: Negative for color change and rash.  Hematological: Negative for adenopathy. Does not bruise/bleed easily.  Psychiatric/Behavioral: Negative for dysphoric mood. The patient is not nervous/anxious.        Objective:    BP 150/89 mmHg  Pulse 63  Temp(Src) 98.5 F (36.9 C) (Oral)  Ht 5\' 6"  (1.676 m)  Wt 137 lb 2 oz (62.199 kg)  BMI 22.14 kg/m2  SpO2 97% Physical Exam  Constitutional: She is oriented to person, place, and time. She appears well-developed and well-nourished. No distress.  HENT:  Head: Normocephalic and atraumatic.  Right Ear: External ear normal.  Left Ear: External ear normal.  Nose: Nose normal.  Mouth/Throat: Oropharynx is clear and moist.  Eyes: Conjunctivae and EOM are normal. Pupils are equal, round, and reactive to light. Right eye exhibits no discharge.  Neck: Normal range of motion. Neck supple. No thyromegaly present.  Cardiovascular: Normal rate, regular rhythm, normal heart sounds and intact distal pulses.  Exam reveals no gallop and no friction rub.   No murmur heard. Pulmonary/Chest: Effort normal. No respiratory distress. She has no wheezes. She has no rales.  Abdominal: Soft. Bowel sounds are normal. She exhibits no distension and no mass. There is tenderness in the right lower quadrant. There is tenderness  at McBurney's point. There is no rebound and no guarding.  Musculoskeletal: Normal range of motion. She exhibits no edema or tenderness.  Lymphadenopathy:    She has no cervical adenopathy.  Neurological: She is alert and oriented to person, place, and time. No cranial nerve deficit. Coordination normal.  Skin: Skin is warm and dry. No rash noted. She is not diaphoretic. No erythema. No pallor.  Psychiatric: She has a normal mood and affect. Her behavior is normal. Judgment and thought content normal.          Assessment & Plan:   Problem List Items  Addressed This Visit      Unprioritized   Right lower quadrant abdominal pain - Primary    Right lower abdominal pain. Tenderness on exam at McBurney's point. Will get STAT CT abdomen to evaluate for appendicitis. UA normal today. Follow up after CT complete. Note that renal function completed at [facility] 8/26 showed normal WBC and normal Cr of 0.8.      Relevant Orders   CT Abdomen Pelvis W Contrast   CBC w/Diff   Comprehensive metabolic panel   Tobacco use disorder    Screening CT chest has not been scheduled. Email sent to schedule exam.       Other Visit Diagnoses    Screening        Relevant Orders    POCT urinalysis dipstick (Completed)        Return in about 1 week (around 01/06/2015) for Recheck.

## 2014-12-30 NOTE — Assessment & Plan Note (Signed)
Screening CT chest has not been scheduled. Email sent to schedule exam.

## 2014-12-30 NOTE — Addendum Note (Signed)
Addended by: Ronette Deter A on: 12/30/2014 09:28 AM   Modules accepted: Orders

## 2014-12-30 NOTE — Patient Instructions (Signed)
CT abdomen and pelvis today to evaluate abdominal pain.

## 2014-12-31 DIAGNOSIS — K648 Other hemorrhoids: Secondary | ICD-10-CM | POA: Diagnosis not present

## 2014-12-31 DIAGNOSIS — R12 Heartburn: Secondary | ICD-10-CM | POA: Diagnosis not present

## 2014-12-31 DIAGNOSIS — K620 Anal polyp: Secondary | ICD-10-CM | POA: Diagnosis not present

## 2014-12-31 DIAGNOSIS — Z1211 Encounter for screening for malignant neoplasm of colon: Secondary | ICD-10-CM | POA: Diagnosis not present

## 2014-12-31 DIAGNOSIS — K640 First degree hemorrhoids: Secondary | ICD-10-CM | POA: Diagnosis not present

## 2014-12-31 DIAGNOSIS — K219 Gastro-esophageal reflux disease without esophagitis: Secondary | ICD-10-CM | POA: Diagnosis not present

## 2014-12-31 DIAGNOSIS — K621 Rectal polyp: Secondary | ICD-10-CM | POA: Diagnosis not present

## 2014-12-31 DIAGNOSIS — K210 Gastro-esophageal reflux disease with esophagitis: Secondary | ICD-10-CM | POA: Diagnosis not present

## 2014-12-31 LAB — HM COLONOSCOPY

## 2015-01-07 ENCOUNTER — Ambulatory Visit (INDEPENDENT_AMBULATORY_CARE_PROVIDER_SITE_OTHER): Payer: Medicare Other | Admitting: Internal Medicine

## 2015-01-07 ENCOUNTER — Encounter: Payer: Self-pay | Admitting: Internal Medicine

## 2015-01-07 VITALS — BP 118/78 | HR 71 | Temp 98.4°F | Resp 18 | Ht 66.0 in | Wt 137.1 lb

## 2015-01-07 DIAGNOSIS — R1011 Right upper quadrant pain: Secondary | ICD-10-CM | POA: Diagnosis not present

## 2015-01-07 NOTE — Progress Notes (Signed)
Pre visit review using our clinic review tool, if applicable. No additional management support is needed unless otherwise documented below in the visit note. 

## 2015-01-07 NOTE — Assessment & Plan Note (Signed)
Right upper abdominal pain. CT abdomen showed no acute process, however there is a persistent gallstone. Question if this may be causing pain. Will set up HIDA scan. Will also request report on recent EGD. Follow up 4 weeks and prn.

## 2015-01-07 NOTE — Progress Notes (Signed)
Subjective:    Patient ID: Tracy Bell, female    DOB: [birth year], 64 y.o.   MRN: [id number]  HPI  64YO female presents for follow up after recent visit for abdominal pain. CT abdomen showed no acute process.  Continues to have some abdominal pain, rated 1/2 out of 10. However, this weekend, pain was severe and "unbearable." Located more RUQ. No NVD. Having normal BM. Had upper endoscopy last week which was reportedly normal. No clear triggers for pain symptoms, however did have fried food this weekend.     No past medical history on file. Family History  Problem Relation Age of Onset  . Cancer Mother     Pancreatic Cancer   . Cancer Father     Stomach Cancer   . Cancer Sister   . Cancer Sister     ovarian   Past Surgical History  Procedure Laterality Date  . Abdominal hysterectomy  1980  . Breast biopsy      normal   Social History   Social History  . Marital Status: Married    Spouse Name: N/A  . Number of Children: N/A  . Years of Education: N/A   Social History Main Topics  . Smoking status: Current Some Day Smoker  . Smokeless tobacco: Not on file  . Alcohol Use: No  . Drug Use: No  . Sexual Activity: Not on file   Other Topics Concern  . Not on file   Social History Narrative   Lives with husband. Has 11 pets in home. Has one son, lives locally.   Was born and raised in [place].   Husband is older than her and she provides care for him.      Work - retired. Invented water bottle for pets    Review of Systems  Constitutional: Negative for fever, chills, appetite change, fatigue and unexpected weight change.  Eyes: Negative for visual disturbance.  Respiratory: Negative for shortness of breath.   Cardiovascular: Negative for chest pain and leg swelling.  Gastrointestinal: Positive for abdominal pain. Negative for nausea, vomiting, diarrhea, constipation, blood in stool and abdominal distention.  Skin: Negative for color change and rash.    Hematological: Negative for adenopathy. Does not bruise/bleed easily.  Psychiatric/Behavioral: Negative for dysphoric mood. The patient is not nervous/anxious.        Objective:    BP 118/78 mmHg  Pulse 71  Temp(Src) 98.4 F (36.9 C) (Oral)  Resp 18  Ht 5\' 6"  (1.676 m)  Wt 137 lb 2 oz (62.199 kg)  BMI 22.14 kg/m2  SpO2 95% Physical Exam  Constitutional: She is oriented to person, place, and time. She appears well-developed and well-nourished. No distress.  HENT:  Head: Normocephalic and atraumatic.  Right Ear: External ear normal.  Left Ear: External ear normal.  Nose: Nose normal.  Mouth/Throat: Oropharynx is clear and moist. No oropharyngeal exudate.  Eyes: Conjunctivae and EOM are normal. Pupils are equal, round, and reactive to light. Right eye exhibits no discharge.  Neck: Normal range of motion. Neck supple. No thyromegaly present.  Cardiovascular: Normal rate, regular rhythm, normal heart sounds and intact distal pulses.  Exam reveals no gallop and no friction rub.   No murmur heard. Pulmonary/Chest: Effort normal. No respiratory distress. She has no wheezes. She has no rales.  Abdominal: Soft. Bowel sounds are normal. She exhibits no distension and no mass. There is tenderness (right upper quadrant). There is no rebound and no guarding.  Musculoskeletal: Normal range of motion.  She exhibits no edema or tenderness.  Lymphadenopathy:    She has no cervical adenopathy.  Neurological: She is alert and oriented to person, place, and time. No cranial nerve deficit. Coordination normal.  Skin: Skin is warm and dry. No rash noted. She is not diaphoretic. No erythema. No pallor.  Psychiatric: She has a normal mood and affect. Her behavior is normal. Judgment and thought content normal.          Assessment & Plan:   Problem List Items Addressed This Visit      Unprioritized   Abdominal pain, right upper quadrant - Primary    Right upper abdominal pain. CT abdomen  showed no acute process, however there is a persistent gallstone. Question if this may be causing pain. Will set up HIDA scan. Will also request report on recent EGD. Follow up 4 weeks and prn.          Return in about 4 weeks (around 02/04/2015) for Recheck.

## 2015-01-07 NOTE — Patient Instructions (Signed)
Please keep a log of your symptoms over the next few weeks.  We will schedule a HIDA scan to look at gallbladder function.  Follow up in 4 weeks or sooner as needed.

## 2015-01-14 ENCOUNTER — Encounter
Admission: RE | Admit: 2015-01-14 | Discharge: 2015-01-14 | Disposition: A | Discharge status: Home / Self Care | Payer: Medicare Other | Source: Ambulatory Visit | Attending: Internal Medicine | Admitting: Internal Medicine

## 2015-01-14 DIAGNOSIS — R1011 Right upper quadrant pain: Secondary | ICD-10-CM | POA: Insufficient documentation

## 2015-01-14 MED ORDER — SINCALIDE 5 MCG IJ SOLR
0.0200 ug/kg | Freq: Once | INTRAMUSCULAR | Status: AC
  Administered 2015-01-14: 1.2 ug via INTRAVENOUS

## 2015-01-14 MED ORDER — TECHNETIUM TC 99M MEBROFENIN IV KIT
5.0000 | PACK | Freq: Once | INTRAVENOUS | Status: DC | PRN
  Administered 2015-01-14: 5.76 via INTRAVENOUS
  Filled 2015-01-14: qty 6

## 2015-01-20 DIAGNOSIS — L720 Epidermal cyst: Secondary | ICD-10-CM | POA: Diagnosis not present

## 2015-01-20 DIAGNOSIS — L821 Other seborrheic keratosis: Secondary | ICD-10-CM | POA: Diagnosis not present

## 2015-01-20 DIAGNOSIS — L814 Other melanin hyperpigmentation: Secondary | ICD-10-CM | POA: Diagnosis not present

## 2015-01-20 DIAGNOSIS — C4441 Basal cell carcinoma of skin of scalp and neck: Secondary | ICD-10-CM | POA: Diagnosis not present

## 2015-01-20 DIAGNOSIS — D1800 Hemangioma unspecified site: Secondary | ICD-10-CM | POA: Diagnosis not present

## 2015-01-20 DIAGNOSIS — D485 Neoplasm of uncertain behavior of skin: Secondary | ICD-10-CM | POA: Diagnosis not present

## 2015-01-28 ENCOUNTER — Ambulatory Visit: Payer: Self-pay | Admitting: Internal Medicine

## 2015-02-09 ENCOUNTER — Encounter: Payer: Self-pay | Admitting: Internal Medicine

## 2015-02-13 ENCOUNTER — Encounter: Payer: Self-pay | Admitting: Internal Medicine

## 2015-02-17 DIAGNOSIS — M65312 Trigger thumb, left thumb: Secondary | ICD-10-CM | POA: Diagnosis not present

## 2015-02-19 DIAGNOSIS — L814 Other melanin hyperpigmentation: Secondary | ICD-10-CM | POA: Diagnosis not present

## 2015-02-19 DIAGNOSIS — C4441 Basal cell carcinoma of skin of scalp and neck: Secondary | ICD-10-CM | POA: Diagnosis not present

## 2015-02-19 DIAGNOSIS — L578 Other skin changes due to chronic exposure to nonionizing radiation: Secondary | ICD-10-CM | POA: Diagnosis not present

## 2015-04-07 ENCOUNTER — Ambulatory Visit: Payer: Self-pay | Admitting: Urology

## 2015-08-24 ENCOUNTER — Telehealth: Payer: Self-pay | Admitting: *Deleted

## 2015-08-24 NOTE — Telephone Encounter (Signed)
Notified patient that her 1 year follow up lung cancer screening low dose CT scan is due. Confirmed that patient is within age range of 55-77, asymptomatic of lung cancer, and no other serious disease processes that would make treatment of lung cancer not possible. The patient is a  current smoker  with a 14 pack/ year history. The shared decision making visit was completed 04/02/14. The patient is agreeable for CT scan to be scheduled. She states she has new insurance ; [employer] # [id number], Plan N. To verify coverage and benefits call [phone]. Patient prefers to have scan on Tues or Thurs mornings.

## 2015-08-28 ENCOUNTER — Other Ambulatory Visit: Payer: Self-pay | Admitting: Family Medicine

## 2015-08-28 ENCOUNTER — Encounter: Payer: Self-pay | Admitting: Family Medicine

## 2015-08-28 DIAGNOSIS — Z87891 Personal history of nicotine dependence: Secondary | ICD-10-CM

## 2015-08-28 HISTORY — DX: Personal history of nicotine dependence: Z87.891

## 2015-09-07 ENCOUNTER — Ambulatory Visit
Admission: RE | Admit: 2015-09-07 | Discharge: 2015-09-07 | Disposition: A | Discharge status: Home / Self Care | Payer: Medicare Other | Source: Ambulatory Visit | Attending: Family Medicine | Admitting: Family Medicine

## 2015-09-09 ENCOUNTER — Telehealth: Payer: Self-pay | Admitting: Internal Medicine

## 2015-09-09 NOTE — Telephone Encounter (Signed)
Left msg to call office to schedule AWV with Denisa/msn °

## 2015-09-11 ENCOUNTER — Ambulatory Visit
Admission: RE | Admit: 2015-09-11 | Discharge: 2015-09-11 | Disposition: A | Discharge status: Home / Self Care | Payer: Medicare Other | Source: Ambulatory Visit | Attending: Family Medicine | Admitting: Family Medicine

## 2015-09-11 DIAGNOSIS — I700 Atherosclerosis of aorta: Secondary | ICD-10-CM | POA: Insufficient documentation

## 2015-09-11 DIAGNOSIS — Z87891 Personal history of nicotine dependence: Secondary | ICD-10-CM | POA: Insufficient documentation

## 2015-09-11 DIAGNOSIS — I2510 Atherosclerotic heart disease of native coronary artery without angina pectoris: Secondary | ICD-10-CM | POA: Insufficient documentation

## 2015-11-05 ENCOUNTER — Ambulatory Visit (INDEPENDENT_AMBULATORY_CARE_PROVIDER_SITE_OTHER): Payer: Medicare Other | Admitting: Internal Medicine

## 2015-11-05 ENCOUNTER — Encounter: Payer: Self-pay | Admitting: Internal Medicine

## 2015-11-05 VITALS — BP 186/112 | HR 85 | Ht 66.0 in | Wt 143.6 lb

## 2015-11-05 DIAGNOSIS — I350 Nonrheumatic aortic (valve) stenosis: Secondary | ICD-10-CM | POA: Diagnosis not present

## 2015-11-05 DIAGNOSIS — I2510 Atherosclerotic heart disease of native coronary artery without angina pectoris: Secondary | ICD-10-CM | POA: Insufficient documentation

## 2015-11-05 DIAGNOSIS — I10 Essential (primary) hypertension: Secondary | ICD-10-CM

## 2015-11-05 LAB — LIPID PANEL
Cholesterol: 197 mg/dL (ref 0–200)
HDL: 54.5 mg/dL (ref >39.00)
LDL CALC: 127 mg/dL — AB (ref 0–99)
NonHDL: 142.86
Total CHOL/HDL Ratio: 4
Triglycerides: 81 mg/dL (ref 0.0–149.0)
VLDL: 16.2 mg/dL (ref 0.0–40.0)

## 2015-11-05 LAB — COMPREHENSIVE METABOLIC PANEL
ALT: 18 U/L (ref 0–35)
AST: 19 U/L (ref 0–37)
Albumin: 4.4 g/dL (ref 3.5–5.2)
Alkaline Phosphatase: 72 U/L (ref 39–117)
BUN: 15 mg/dL (ref 6–23)
CHLORIDE: 106 meq/L (ref 96–112)
CO2: 31 mEq/L (ref 19–32)
Calcium: 9.4 mg/dL (ref 8.4–10.5)
Creatinine, Ser: 0.81 mg/dL (ref 0.40–1.20)
GFR: 74.86 mL/min (ref >60.00)
Glucose, Bld: 78 mg/dL (ref 70–99)
POTASSIUM: 4.3 meq/L (ref 3.5–5.1)
Sodium: 142 mEq/L (ref 135–145)
Total Bilirubin: 1 mg/dL (ref 0.2–1.2)
Total Protein: 7 g/dL (ref 6.0–8.3)

## 2015-11-05 LAB — MICROALBUMIN / CREATININE URINE RATIO
Creatinine,U: 103.3 mg/dL
MICROALB UR: 0.7 mg/dL (ref 0.0–1.9)
Microalb Creat Ratio: 0.7 mg/g (ref 0.0–30.0)

## 2015-11-05 LAB — TSH: TSH: 3.83 u[IU]/mL (ref 0.35–4.50)

## 2015-11-05 MED ORDER — AMLODIPINE BESYLATE 2.5 MG PO TABS: 2.5000 mg | ORAL_TABLET | Freq: Every day | ORAL | Status: DC

## 2015-11-05 NOTE — Progress Notes (Signed)
Pre visit review using our clinic review tool, if applicable. No additional management support is needed unless otherwise documented below in the visit note. 

## 2015-11-05 NOTE — Assessment & Plan Note (Addendum)
BP Readings from Last 3 Encounters:  11/05/15 186/112  01/07/15 118/78  12/30/14 150/89   BP extremely high. She has declined BP medications in the past, but willing today to at least temporarily start Amlodipine 2.5mg  daily. Recheck BP in 1 week. Renal function with labs. EKG today shows non-specific t-wave changes. Will also set up evaluation with cardiology.

## 2015-11-05 NOTE — Patient Instructions (Signed)
Labs today.  Start Amlodipine 2.5mg  daily to help control blood pressure.  Follow up in 1 week.

## 2015-11-05 NOTE — Assessment & Plan Note (Signed)
Reviewed results from CT chest which showed calcifications in the coronary arteries. She declines statin medications. Will continue Aspirin. Discussed importance of BP control, smoking cessation.

## 2015-11-05 NOTE — Assessment & Plan Note (Signed)
Reviewed last ECHO from 04/2015 which showed mild aortic stenosis. Symptomatically stable. Discussed importance of BP control.

## 2015-11-05 NOTE — Progress Notes (Signed)
Subjective:    Patient ID: Tracy Bell, female    DOB: 29-Jan-1951, 65 y.o.   MRN: [id number]  HPI  65YO female presents for follow up.  Recently underwent CT chest to screen for lung cancer. Lung findings were stable, however CT showed calcification of coronary arteries.  HTN - Does not check BP. No CP. Occasional mild headache, resolves with Advil.  Reports increased stress recently, caring for husband and animals.   Wt Readings from Last 3 Encounters:  11/05/15 143 lb 9.6 oz (65.137 kg)  09/11/15 138 lb (62.596 kg)  01/07/15 137 lb 2 oz (62.199 kg)   BP Readings from Last 3 Encounters:  11/05/15 186/112  01/07/15 118/78  12/30/14 150/89    Past Medical History  Diagnosis Date  . Personal history of tobacco use, presenting hazards to health 08/28/2015   Family History  Problem Relation Age of Onset  . Cancer Mother     Pancreatic Cancer   . Cancer Father     Stomach Cancer   . Cancer Sister   . Cancer Sister     ovarian   Past Surgical History  Procedure Laterality Date  . Abdominal hysterectomy  1980  . Breast biopsy      normal   Social History   Social History  . Marital Status: Married    Spouse Name: N/A  . Number of Children: N/A  . Years of Education: N/A   Social History Main Topics  . Smoking status: Current Some Day Smoker  . Smokeless tobacco: None  . Alcohol Use: No  . Drug Use: No  . Sexual Activity: Not Asked   Other Topics Concern  . None   Social History Narrative   Lives with husband. Has 11 pets in home. Has one son, lives locally.   Was born and raised in [place].   Husband is older than her and she provides care for him.      Work - retired. Invented water bottle for pets    Review of Systems  Constitutional: Negative for fever, chills, appetite change, fatigue and unexpected weight change.  Eyes: Negative for visual disturbance.  Respiratory: Negative for shortness of breath.   Cardiovascular: Negative for  chest pain, palpitations and leg swelling.  Gastrointestinal: Negative for abdominal pain, diarrhea and constipation.  Skin: Negative for color change and rash.  Neurological: Positive for headaches.  Hematological: Negative for adenopathy. Does not bruise/bleed easily.  Psychiatric/Behavioral: Negative for dysphoric mood. The patient is not nervous/anxious.        Objective:    BP 186/112 mmHg  Pulse 85  Ht 5\' 6"  (1.676 m)  Wt 143 lb 9.6 oz (65.137 kg)  BMI 23.19 kg/m2  SpO2 95% Physical Exam  Constitutional: She is oriented to person, place, and time. She appears well-developed and well-nourished. No distress.  HENT:  Head: Normocephalic and atraumatic.  Right Ear: External ear normal.  Left Ear: External ear normal.  Nose: Nose normal.  Mouth/Throat: Oropharynx is clear and moist. No oropharyngeal exudate.  Eyes: Conjunctivae are normal. Pupils are equal, round, and reactive to light. Right eye exhibits no discharge. Left eye exhibits no discharge. No scleral icterus.  Neck: Normal range of motion. Neck supple. No tracheal deviation present. No thyromegaly present.  Cardiovascular: Normal rate, regular rhythm and intact distal pulses.  Exam reveals no gallop and no friction rub.   Murmur (right upper chest, systolic) heard. Pulmonary/Chest: Effort normal and breath sounds normal. No respiratory distress. She has  no wheezes. She has no rales. She exhibits no tenderness.  Musculoskeletal: Normal range of motion. She exhibits no edema or tenderness.  Lymphadenopathy:    She has no cervical adenopathy.  Neurological: She is alert and oriented to person, place, and time. No cranial nerve deficit. She exhibits normal muscle tone. Coordination normal.  Skin: Skin is warm and dry. No rash noted. She is not diaphoretic. No erythema. No pallor.  Psychiatric: She has a normal mood and affect. Her behavior is normal. Judgment and thought content normal.          Assessment & Plan:    Problem List Items Addressed This Visit      Unprioritized   Aortic stenosis, mild    Reviewed last ECHO from 04/2015 which showed mild aortic stenosis. Symptomatically stable. Discussed importance of BP control.      Relevant Medications   amLODipine (NORVASC) 2.5 MG tablet   Coronary atherosclerosis    Reviewed results from CT chest which showed calcifications in the coronary arteries. She declines statin medications. Will continue Aspirin. Discussed importance of BP control, smoking cessation.      Relevant Medications   amLODipine (NORVASC) 2.5 MG tablet   Other Relevant Orders   Ambulatory referral to Cardiology   Essential hypertension - Primary    BP Readings from Last 3 Encounters:  11/05/15 186/112  01/07/15 118/78  12/30/14 150/89   BP extremely high. She has declined BP medications in the past, but willing today to at least temporarily start Amlodipine 2.5mg  daily. Recheck BP in 1 week. Renal function with labs. EKG today shows non-specific t-wave changes.       Relevant Medications   amLODipine (NORVASC) 2.5 MG tablet   Other Relevant Orders   TSH   Comprehensive metabolic panel   Lipid panel   Microalbumin / creatinine urine ratio   EKG 12-Lead (Completed)   Ambulatory referral to Cardiology       Return in about 1 week (around 11/12/2015) for Recheck of Blood Pressure.  Ronette Deter, MD Internal Medicine [facility]

## 2015-11-17 ENCOUNTER — Encounter: Payer: Self-pay | Admitting: Family

## 2015-11-17 ENCOUNTER — Ambulatory Visit (INDEPENDENT_AMBULATORY_CARE_PROVIDER_SITE_OTHER): Payer: Medicare Other | Admitting: Family

## 2015-11-17 VITALS — BP 158/90 | HR 64 | Wt 141.8 lb

## 2015-11-17 DIAGNOSIS — R911 Solitary pulmonary nodule: Secondary | ICD-10-CM | POA: Diagnosis not present

## 2015-11-17 DIAGNOSIS — F17200 Nicotine dependence, unspecified, uncomplicated: Secondary | ICD-10-CM | POA: Diagnosis not present

## 2015-11-17 DIAGNOSIS — I700 Atherosclerosis of aorta: Secondary | ICD-10-CM

## 2015-11-17 DIAGNOSIS — I2510 Atherosclerotic heart disease of native coronary artery without angina pectoris: Secondary | ICD-10-CM | POA: Diagnosis not present

## 2015-11-17 DIAGNOSIS — E7800 Pure hypercholesterolemia, unspecified: Secondary | ICD-10-CM | POA: Insufficient documentation

## 2015-11-17 DIAGNOSIS — I10 Essential (primary) hypertension: Secondary | ICD-10-CM | POA: Diagnosis not present

## 2015-11-17 DIAGNOSIS — I350 Nonrheumatic aortic (valve) stenosis: Secondary | ICD-10-CM

## 2015-11-17 MED ORDER — ATORVASTATIN CALCIUM 10 MG PO TABS: 10.0000 mg | ORAL_TABLET | Freq: Every day | ORAL | Status: DC

## 2015-11-17 MED ORDER — AMLODIPINE BESYLATE 5 MG PO TABS: 5.0000 mg | ORAL_TABLET | Freq: Every day | ORAL | Status: DC

## 2015-11-17 NOTE — Assessment & Plan Note (Signed)
New left lung nodule. Likely benign. Recommended 6 month screening which I ordered today.

## 2015-11-17 NOTE — Assessment & Plan Note (Addendum)
LDL 126. Framingham risk 12%. LDL Goal < 55.

## 2015-11-17 NOTE — Assessment & Plan Note (Signed)
Uncontrolled. Increased amlodipine. If not controlled at follow up, will add ACE-I.

## 2015-11-17 NOTE — Progress Notes (Addendum)
Subjective:    Patient ID: Tracy Bell, female    DOB: 16-Jan-1951, 65 y.o.   MRN: [id number]   Tracy Bell is a 65 y.o. female who presents today for an acute visit.    HPI Comments: Patient here for follow-up blood pressure after starting amlodipine 2.5 mg couple weeks ago. Tolerating medication well. She is also here for follow-up after findings of CT chest.    Per chart review, CT chest showed calcification of aorta, few small solid and part solid nodules are Identified, and new solid subpleural nodule identified within the posterior left lower lobe. At her last visit, per chart review, patient politely declined starting statin therapy.  Past Medical History  Diagnosis Date  . Personal history of tobacco use, presenting hazards to health 08/28/2015   Allergies: Review of patient's allergies indicates no known allergies. Current Outpatient Prescriptions on File Prior to Visit  Medication Sig Dispense Refill  . aspirin EC 81 MG tablet Take 1 tablet (81 mg total) by mouth daily. 30 tablet 6  . Multiple Vitamins-Minerals (CENTRUM ADULTS) TABS Take 1 tablet by mouth daily. 30 tablet 6   No current facility-administered medications on file prior to visit.    Social History  Substance Use Topics  . Smoking status: Current Some Day Smoker  . Smokeless tobacco: None  . Alcohol Use: No    Review of Systems  Constitutional: Negative for fever and chills.  Respiratory: Negative for cough and shortness of breath.   Cardiovascular: Negative for chest pain and palpitations.  Gastrointestinal: Negative for nausea and vomiting.  Neurological: Negative for headaches.      Objective:    BP 158/90 mmHg  Pulse 64  Wt 141 lb 12.8 oz (64.32 kg)  SpO2 100%   Physical Exam  Constitutional: She appears well-developed and well-nourished.  Eyes: Conjunctivae are normal.  Cardiovascular: Normal rate, regular rhythm, normal heart sounds and normal pulses.   Pulmonary/Chest:  Effort normal and breath sounds normal. She has no wheezes. She has no rhonchi. She has no rales.  Neurological: She is alert.  Skin: Skin is warm and dry.  Psychiatric: She has a normal mood and affect. Her speech is normal and behavior is normal. Thought content normal.  Vitals reviewed.      Assessment & Plan:   Problem List Items Addressed This Visit      Cardiovascular and Mediastinum   Atherosclerosis of aorta (HCC)    Added statin today. Advised smoking cessation.       Relevant Medications   amLODipine (NORVASC) 5 MG tablet   atorvastatin (LIPITOR) 10 MG tablet   Aortic stenosis, mild    Symptomatically stable. On ASA.       Relevant Medications   amLODipine (NORVASC) 5 MG tablet   atorvastatin (LIPITOR) 10 MG tablet   Essential hypertension - Primary    Uncontrolled. Increased amlodipine. If not controlled at follow up, will add ACE-I.       Relevant Medications   amLODipine (NORVASC) 5 MG tablet   atorvastatin (LIPITOR) 10 MG tablet   Coronary atherosclerosis    Started statin today.      Relevant Medications   amLODipine (NORVASC) 5 MG tablet   atorvastatin (LIPITOR) 10 MG tablet     Other   Tobacco use disorder    New left lung nodule. Likely benign. Recommended 6 month screening which I ordered today.       Relevant Orders   CT CHEST LUNG CANCER SCREENING LOW  DOSE WO CONTRAST   Nodule of left lung   Hypercholesteremia    LDL 126. Framingham risk 12%. LDL Goal < 55.       Relevant Medications   amLODipine (NORVASC) 5 MG tablet   atorvastatin (LIPITOR) 10 MG tablet        I have changed Tracy Bell amLODipine. I am also having her start on atorvastatin. Additionally, I am having her maintain her aspirin EC and CENTRUM ADULTS.   Meds ordered this encounter  Medications  . amLODipine (NORVASC) 5 MG tablet    Sig: Take 1 tablet (5 mg total) by mouth daily.    Dispense:  90 tablet    Refill:  3    Order Specific Question:  Supervising  Provider    Answer:  Cassandria Anger [[id number]]  . atorvastatin (LIPITOR) 10 MG tablet    Sig: Take 1 tablet (10 mg total) by mouth daily.    Dispense:  90 tablet    Refill:  3    Order Specific Question:  Supervising Provider    Answer:  Cassandria Anger [[id number]]    Return precautions given.   Risks, benefits, and alternatives of the medications and treatment plan prescribed today were discussed, and patient expressed understanding.   Education regarding symptom management and diagnosis given to patient on AVS.  Continue to follow with [name], FNP for routine health maintenance.   Tracy Bell and I agreed with plan.   [name], FNP

## 2015-11-17 NOTE — Assessment & Plan Note (Signed)
Added statin today. Advised smoking cessation.

## 2015-11-17 NOTE — Assessment & Plan Note (Signed)
Started statin today.

## 2015-11-17 NOTE — Assessment & Plan Note (Signed)
Symptomatically stable. On ASA.

## 2015-11-17 NOTE — Patient Instructions (Addendum)
If there is no improvement in your symptoms, or if there is any worsening of symptoms, or if you have any additional concerns, please return for re-evaluation; or, if we are closed, consider going to the Emergency Room for evaluation if symptoms urgent.  High Cholesterol High cholesterol refers to having a high level of cholesterol in your blood. Cholesterol is a white, waxy, fat-like protein that your body needs in small amounts. Your liver makes all the cholesterol you need. Excess cholesterol comes from the food you eat. Cholesterol travels in your bloodstream through your blood vessels. If you have high cholesterol, deposits (plaque) may build up on the walls of your blood vessels. This makes the arteries narrower and stiffer. Plaque increases your risk of heart attack and stroke. Work with your health care provider to keep your cholesterol levels in a healthy range. RISK FACTORS Several things can make you more likely to have high cholesterol. These include:   Eating foods high in animal fat (saturated fat) or cholesterol.  Being overweight.  Not getting enough exercise.  Having a family history of high cholesterol. SIGNS AND SYMPTOMS High cholesterol does not cause symptoms. DIAGNOSIS  Your health care provider can do a blood test to check whether you have high cholesterol. If you are older than 20, your health care provider may check your cholesterol every 4-6 years. You may be checked more often if you already have high cholesterol or other risk factors for heart disease. The blood test for cholesterol measures the following:  Bad cholesterol (LDL cholesterol). This is the type of cholesterol that causes heart disease. This number should be less than 100.  Good cholesterol (HDL cholesterol). This type helps protect against heart disease. A healthy level of HDL cholesterol is 60 or higher.  Total cholesterol. This is the combined number of LDL cholesterol and HDL cholesterol. A healthy  number is less than 200. TREATMENT  High cholesterol can be treated with diet changes, lifestyle changes, and medicine.   Diet changes may include eating more whole grains, fruits, vegetables, nuts, and fish. You may also have to cut back on red meat and foods with a lot of added sugar.  Lifestyle changes may include getting at least 40 minutes of aerobic exercise three times a week. Aerobic exercises include walking, biking, and swimming. Aerobic exercise along with a healthy diet can help you maintain a healthy weight. Lifestyle changes may also include quitting smoking.  If diet and lifestyle changes are not enough to lower your cholesterol, your health care provider may prescribe a statin medicine. This medicine has been shown to lower cholesterol and also lower the risk of heart disease. HOME CARE INSTRUCTIONS  Only take over-the-counter or prescription medicines as directed by your health care provider.   Follow a healthy diet as directed by your health care provider. For instance:   Eat chicken (without skin), fish, veal, shellfish, ground [place] breast, and round or loin cuts of red meat.  Do not eat fried foods and fatty meats, such as hot dogs and salami.   Eat plenty of fruits, such as apples.   Eat plenty of vegetables, such as broccoli, potatoes, and carrots.   Eat beans, peas, and lentils.   Eat grains, such as barley, rice, couscous, and bulgur wheat.   Eat pasta without cream sauces.   Use skim or nonfat milk and low-fat or nonfat yogurt and cheeses. Do not eat or drink whole milk, cream, ice cream, egg yolks, and hard cheeses.  Do not eat stick margarine or tub margarines that contain trans fats (also called partially hydrogenated oils).   Do not eat cakes, cookies, crackers, or other baked goods that contain trans fats.   Do not eat saturated tropical oils, such as coconut and palm oil.   Exercise as directed by your health care provider. Increase  your activity level with activities such as gardening or walking.   Keep all follow-up appointments.  SEEK MEDICAL CARE IF:  You are struggling to maintain a healthy diet or weight.  You need help starting an exercise program.  You need help to stop smoking. SEEK IMMEDIATE MEDICAL CARE IF:  You have chest pain.  You have trouble breathing.   This information is not intended to replace advice given to you by your health care provider. Make sure you discuss any questions you have with your health care provider.   Document Released: 04/18/2005 Document Revised: 05/09/2014 Document Reviewed: 02/08/2013 Elsevier Interactive Patient Education 2016 [employer].  Hypertension Hypertension, commonly called high blood pressure, is when the force of blood pumping through your arteries is too strong. Your arteries are the blood vessels that carry blood from your heart throughout your body. A blood pressure reading consists of a higher number over a lower number, such as 110/72. The higher number (systolic) is the pressure inside your arteries when your heart pumps. The lower number (diastolic) is the pressure inside your arteries when your heart relaxes. Ideally you want your blood pressure below 120/80. Hypertension forces your heart to work harder to pump blood. Your arteries may become narrow or stiff. Having untreated or uncontrolled hypertension can cause heart attack, stroke, kidney disease, and other problems. RISK FACTORS Some risk factors for high blood pressure are controllable. Others are not.  Risk factors you cannot control include:   Race. You may be at higher risk if you are African American.  Age. Risk increases with age.  Gender. Men are at higher risk than women before age 40 years. After age 26, women are at higher risk than men. Risk factors you can control include:  Not getting enough exercise or physical activity.  Being overweight.  Getting too much fat, sugar,  calories, or salt in your diet.  Drinking too much alcohol. SIGNS AND SYMPTOMS Hypertension does not usually cause signs or symptoms. Extremely high blood pressure (hypertensive crisis) may cause headache, anxiety, shortness of breath, and nosebleed. DIAGNOSIS To check if you have hypertension, your health care provider will measure your blood pressure while you are seated, with your arm held at the level of your heart. It should be measured at least twice using the same arm. Certain conditions can cause a difference in blood pressure between your right and left arms. A blood pressure reading that is higher than normal on one occasion does not mean that you need treatment. If it is not clear whether you have high blood pressure, you may be asked to return on a different day to have your blood pressure checked again. Or, you may be asked to monitor your blood pressure at home for 1 or more weeks. TREATMENT Treating high blood pressure includes making lifestyle changes and possibly taking medicine. Living a healthy lifestyle can help lower high blood pressure. You may need to change some of your habits. Lifestyle changes may include:  Following the DASH diet. This diet is high in fruits, vegetables, and whole grains. It is low in salt, red meat, and added sugars.  Keep your sodium  intake below 2,300 mg per day.  Getting at least 30-45 minutes of aerobic exercise at least 4 times per week.  Losing weight if necessary.  Not smoking.  Limiting alcoholic beverages.  Learning ways to reduce stress. Your health care provider may prescribe medicine if lifestyle changes are not enough to get your blood pressure under control, and if one of the following is true:  You are 49-74 years of age and your systolic blood pressure is above 140.  You are 33 years of age or older, and your systolic blood pressure is above 150.  Your diastolic blood pressure is above 90.  You have diabetes, and your  systolic blood pressure is over [id number] or your diastolic blood pressure is over 90.  You have kidney disease and your blood pressure is above 140/90.  You have heart disease and your blood pressure is above 140/90. Your personal target blood pressure may vary depending on your medical conditions, your age, and other factors. HOME CARE INSTRUCTIONS  Have your blood pressure rechecked as directed by your health care provider.   Take medicines only as directed by your health care provider. Follow the directions carefully. Blood pressure medicines must be taken as prescribed. The medicine does not work as well when you skip doses. Skipping doses also puts you at risk for problems.  Do not smoke.   Monitor your blood pressure at home as directed by your health care provider. SEEK MEDICAL CARE IF:   You think you are having a reaction to medicines taken.  You have recurrent headaches or feel dizzy.  You have swelling in your ankles.  You have trouble with your vision. SEEK IMMEDIATE MEDICAL CARE IF:  You develop a severe headache or confusion.  You have unusual weakness, numbness, or feel faint.  You have severe chest or abdominal pain.  You vomit repeatedly.  You have trouble breathing. MAKE SURE YOU:   Understand these instructions.  Will watch your condition.  Will get help right away if you are not doing well or get worse.   This information is not intended to replace advice given to you by your health care provider. Make sure you discuss any questions you have with your health care provider.   Document Released: 04/18/2005 Document Revised: 09/02/2014 Document Reviewed: 02/08/2013 Elsevier Interactive Patient Education [employer].

## 2015-11-19 DIAGNOSIS — R0602 Shortness of breath: Secondary | ICD-10-CM | POA: Diagnosis not present

## 2015-11-19 DIAGNOSIS — I10 Essential (primary) hypertension: Secondary | ICD-10-CM | POA: Diagnosis not present

## 2015-11-19 DIAGNOSIS — I2510 Atherosclerotic heart disease of native coronary artery without angina pectoris: Secondary | ICD-10-CM | POA: Diagnosis not present

## 2015-12-08 DIAGNOSIS — I2510 Atherosclerotic heart disease of native coronary artery without angina pectoris: Secondary | ICD-10-CM | POA: Diagnosis not present

## 2015-12-08 DIAGNOSIS — R0602 Shortness of breath: Secondary | ICD-10-CM | POA: Diagnosis not present

## 2015-12-15 DIAGNOSIS — J410 Simple chronic bronchitis: Secondary | ICD-10-CM | POA: Diagnosis not present

## 2015-12-15 DIAGNOSIS — I2510 Atherosclerotic heart disease of native coronary artery without angina pectoris: Secondary | ICD-10-CM | POA: Diagnosis not present

## 2015-12-15 DIAGNOSIS — J449 Chronic obstructive pulmonary disease, unspecified: Secondary | ICD-10-CM | POA: Insufficient documentation

## 2015-12-15 DIAGNOSIS — R079 Chest pain, unspecified: Secondary | ICD-10-CM | POA: Insufficient documentation

## 2015-12-15 DIAGNOSIS — E7800 Pure hypercholesterolemia, unspecified: Secondary | ICD-10-CM | POA: Diagnosis not present

## 2015-12-15 DIAGNOSIS — I10 Essential (primary) hypertension: Secondary | ICD-10-CM | POA: Diagnosis not present

## 2015-12-22 ENCOUNTER — Ambulatory Visit (INDEPENDENT_AMBULATORY_CARE_PROVIDER_SITE_OTHER): Payer: Medicare Other | Admitting: Family

## 2015-12-22 ENCOUNTER — Encounter: Payer: Self-pay | Admitting: Family

## 2015-12-22 DIAGNOSIS — I10 Essential (primary) hypertension: Secondary | ICD-10-CM | POA: Diagnosis not present

## 2015-12-22 DIAGNOSIS — I2510 Atherosclerotic heart disease of native coronary artery without angina pectoris: Secondary | ICD-10-CM | POA: Diagnosis not present

## 2015-12-22 DIAGNOSIS — F17200 Nicotine dependence, unspecified, uncomplicated: Secondary | ICD-10-CM | POA: Diagnosis not present

## 2015-12-22 NOTE — Assessment & Plan Note (Signed)
I offered to write a prescription for Chantix today however patient politely declined. She stated she would let me know when she was ready and would give me a call.

## 2015-12-22 NOTE — Assessment & Plan Note (Signed)
Pressure at goal. Continue current regimen.

## 2015-12-22 NOTE — Progress Notes (Signed)
Subjective:    Patient ID: Tracy Bell, female    DOB: 30-Mar-1951, 65 y.o.   MRN: [id number]  CC: Tracy Bell is a 65 y.o. female who presents today for follow up.   HPI: Patient here for follow-up on blood pressure. At last visit, we added amlodipine. Denies exertional chest pain or pressure, numbness or tingling radiating to left arm or jaw, palpitations, dizziness, frequent headaches, changes in vision, or shortness of breath.     Has heard to great things about Chantix and is considering stopping smoking.   Follows with cardiologist and was recently told she had COPD.        HISTORY:  Past Medical History:  Diagnosis Date  . Personal history of tobacco use, presenting hazards to health 08/28/2015   Past Surgical History:  Procedure Laterality Date  . ABDOMINAL HYSTERECTOMY  1980  . BREAST BIOPSY     normal   Family History  Problem Relation Age of Onset  . Cancer Mother     Pancreatic Cancer   . Cancer Father     Stomach Cancer   . Cancer Sister   . Cancer Sister     ovarian    Allergies: Review of patient's allergies indicates no known allergies. Current Outpatient Prescriptions on File Prior to Visit  Medication Sig Dispense Refill  . amLODipine (NORVASC) 5 MG tablet Take 1 tablet (5 mg total) by mouth daily. 90 tablet 3  . aspirin EC 81 MG tablet Take 1 tablet (81 mg total) by mouth daily. 30 tablet 6  . atorvastatin (LIPITOR) 10 MG tablet Take 1 tablet (10 mg total) by mouth daily. 90 tablet 3  . Multiple Vitamins-Minerals (CENTRUM ADULTS) TABS Take 1 tablet by mouth daily. 30 tablet 6   No current facility-administered medications on file prior to visit.     Social History  Substance Use Topics  . Smoking status: Current Some Day Smoker  . Smokeless tobacco: Never Used  . Alcohol use No    Review of Systems  Constitutional: Negative for chills and fever.  Respiratory: Negative for cough.   Cardiovascular: Negative for chest pain and  palpitations.  Gastrointestinal: Negative for nausea and vomiting.      Objective:    BP 122/82 (BP Location: Right Arm, Patient Position: Sitting, Cuff Size: Large)   Pulse 94   Temp 98.2 F (36.8 C) (Oral)   Resp 16   Ht 5\' 6"  (1.676 m)   Wt 143 lb (64.9 kg)   SpO2 95%   BMI 23.08 kg/m  BP Readings from Last 3 Encounters:  12/22/15 122/82  11/17/15 (!) 158/90  11/05/15 (!) 186/112   Wt Readings from Last 3 Encounters:  12/22/15 143 lb (64.9 kg)  11/17/15 141 lb 12.8 oz (64.3 kg)  11/05/15 143 lb 9.6 oz (65.1 kg)    Physical Exam  Constitutional: She appears well-developed and well-nourished.  Eyes: Conjunctivae are normal.  Cardiovascular: Normal rate, regular rhythm, normal heart sounds and normal pulses.   Pulmonary/Chest: Effort normal and breath sounds normal. She has no wheezes. She has no rhonchi. She has no rales.  Neurological: She is alert.  Skin: Skin is warm and dry.  Psychiatric: She has a normal mood and affect. Her speech is normal and behavior is normal. Thought content normal.  Vitals reviewed.      Assessment & Plan:   Problem List Items Addressed This Visit      Cardiovascular and Mediastinum   Essential hypertension  Pressure at goal. Continue current regimen.        Other   Tobacco use disorder    I offered to write a prescription for Chantix today however patient politely declined. She stated she would let me know when she was ready and would give me a call.       Other Visit Diagnoses   None.      I am having Ms. Canto maintain her aspirin EC, CENTRUM ADULTS, amLODipine, and atorvastatin.   No orders of the defined types were placed in this encounter.   Return precautions given.   Risks, benefits, and alternatives of the medications and treatment plan prescribed today were discussed, and patient expressed understanding.   Education regarding symptom management and diagnosis given to patient on AVS.  Continue to  follow with [name], FNP for routine health maintenance.   Takeia M Patry and I agreed with plan.   [name], FNP

## 2015-12-22 NOTE — Patient Instructions (Signed)
So happy your blood pressure looks so great.  Let me know anytime about Chantix.

## 2016-06-29 ENCOUNTER — Telehealth: Payer: Self-pay | Admitting: Family

## 2016-06-29 NOTE — Telephone Encounter (Signed)
I called and left a vm to call the office to sch AWV. Thank you!

## 2016-08-03 DIAGNOSIS — M65332 Trigger finger, left middle finger: Secondary | ICD-10-CM | POA: Diagnosis not present

## 2016-09-06 ENCOUNTER — Telehealth: Payer: Self-pay | Admitting: Family

## 2016-09-06 NOTE — Telephone Encounter (Signed)
Pt declined to schedule AWV °

## 2016-10-03 ENCOUNTER — Ambulatory Visit: Payer: Self-pay | Admitting: Family

## 2017-03-16 ENCOUNTER — Telehealth: Payer: Self-pay | Admitting: *Deleted

## 2017-03-16 DIAGNOSIS — Z87891 Personal history of nicotine dependence: Secondary | ICD-10-CM

## 2017-03-16 DIAGNOSIS — Z122 Encounter for screening for malignant neoplasm of respiratory organs: Secondary | ICD-10-CM

## 2017-03-16 NOTE — Telephone Encounter (Signed)
Notified patient that annual lung cancer screening low dose CT scan is due currently or will be in near future. Confirmed that patient is within the age range of 55-77, and asymptomatic, (no signs or symptoms of lung cancer). Patient denies illness that would prevent curative treatment for lung cancer if found. Verified smoking history, (current 73 pack year). The shared decision making visit was done 04/02/14. Patient is agreeable for CT scan being scheduled.

## 2017-03-21 ENCOUNTER — Ambulatory Visit
Admission: RE | Admit: 2017-03-21 | Discharge: 2017-03-21 | Disposition: A | Discharge status: Home / Self Care | Payer: Medicare Other | Source: Ambulatory Visit | Attending: Nurse Practitioner | Admitting: Nurse Practitioner

## 2017-03-21 DIAGNOSIS — I700 Atherosclerosis of aorta: Secondary | ICD-10-CM | POA: Diagnosis not present

## 2017-03-21 DIAGNOSIS — Z122 Encounter for screening for malignant neoplasm of respiratory organs: Secondary | ICD-10-CM | POA: Diagnosis not present

## 2017-03-21 DIAGNOSIS — Z87891 Personal history of nicotine dependence: Secondary | ICD-10-CM | POA: Diagnosis not present

## 2017-03-21 DIAGNOSIS — J439 Emphysema, unspecified: Secondary | ICD-10-CM | POA: Diagnosis not present

## 2017-03-21 DIAGNOSIS — F17210 Nicotine dependence, cigarettes, uncomplicated: Secondary | ICD-10-CM | POA: Diagnosis not present

## 2017-03-28 ENCOUNTER — Encounter: Payer: Self-pay | Admitting: *Deleted

## 2017-06-15 ENCOUNTER — Telehealth: Payer: Self-pay | Admitting: Family

## 2017-06-15 NOTE — Telephone Encounter (Signed)
Please mail letter to patient:   Ms Tracy, Bell you are doing well.   I received results from your annual CT lung scan from [facility].  It was noted on the exam was that you have coronary artery atherosclerosis, often referred to as hardening of the arteries.   This can put you at risk for stroke, heart attack.   Please make a follow up appointment with me so we can discuss lifestyle changes and ensure no further testing or medication management ( such as statin therapy) is needed.   I would also like to ensure you have had a recent lipid panel ( fasting blood work) to ensure your cholesterol is at goal.   Look forward to seeing you!   Best,   [name], NP

## 2017-06-16 NOTE — Telephone Encounter (Signed)
Letter printed and mailed.  

## 2017-07-04 ENCOUNTER — Telehealth: Payer: Self-pay | Admitting: Family

## 2017-07-05 NOTE — Telephone Encounter (Signed)
close

## 2018-02-07 DIAGNOSIS — M67411 Ganglion, right shoulder: Secondary | ICD-10-CM | POA: Diagnosis not present

## 2018-02-07 DIAGNOSIS — M7541 Impingement syndrome of right shoulder: Secondary | ICD-10-CM | POA: Diagnosis not present

## 2018-03-10 ENCOUNTER — Telehealth: Payer: Self-pay

## 2018-03-10 NOTE — Telephone Encounter (Signed)
Call pt regarding lung screening . PT is current smoker. Pt smokes about 2 packs per day. She would like a morning appt around 10. Pt want to quite smoking but is afraid that she may fail at it!!

## 2018-03-13 ENCOUNTER — Telehealth: Payer: Self-pay | Admitting: *Deleted

## 2018-03-13 DIAGNOSIS — Z122 Encounter for screening for malignant neoplasm of respiratory organs: Secondary | ICD-10-CM

## 2018-03-13 DIAGNOSIS — Z87891 Personal history of nicotine dependence: Secondary | ICD-10-CM

## 2018-03-13 NOTE — Telephone Encounter (Signed)
Patient has been notified that annual lung cancer screening low dose CT scan is due currently or will be in near future. Confirmed that patient is within the age range of 55-77, and asymptomatic, (no signs or symptoms of lung cancer). Patient denies illness that would prevent curative treatment for lung cancer if found. Verified smoking history, (current, 55 pack year). The shared decision making visit was done 04/02/14. Patient is agreeable for CT scan being scheduled.

## 2018-03-20 ENCOUNTER — Ambulatory Visit: Payer: Self-pay

## 2018-03-21 ENCOUNTER — Ambulatory Visit
Admission: RE | Admit: 2018-03-21 | Discharge: 2018-03-21 | Disposition: A | Discharge status: Home / Self Care | Payer: Medicare Other | Source: Ambulatory Visit | Attending: Oncology | Admitting: Oncology

## 2018-03-21 DIAGNOSIS — J432 Centrilobular emphysema: Secondary | ICD-10-CM | POA: Diagnosis not present

## 2018-03-21 DIAGNOSIS — I700 Atherosclerosis of aorta: Secondary | ICD-10-CM | POA: Insufficient documentation

## 2018-03-21 DIAGNOSIS — Z87891 Personal history of nicotine dependence: Secondary | ICD-10-CM | POA: Insufficient documentation

## 2018-03-21 DIAGNOSIS — Z122 Encounter for screening for malignant neoplasm of respiratory organs: Secondary | ICD-10-CM | POA: Diagnosis not present

## 2018-03-21 DIAGNOSIS — F17210 Nicotine dependence, cigarettes, uncomplicated: Secondary | ICD-10-CM | POA: Diagnosis not present

## 2018-03-21 DIAGNOSIS — I2510 Atherosclerotic heart disease of native coronary artery without angina pectoris: Secondary | ICD-10-CM | POA: Insufficient documentation

## 2018-03-21 DIAGNOSIS — I77810 Thoracic aortic ectasia: Secondary | ICD-10-CM | POA: Diagnosis not present

## 2018-03-21 DIAGNOSIS — Z136 Encounter for screening for cardiovascular disorders: Secondary | ICD-10-CM | POA: Diagnosis not present

## 2018-03-23 ENCOUNTER — Ambulatory Visit (INDEPENDENT_AMBULATORY_CARE_PROVIDER_SITE_OTHER): Payer: Medicare Other | Admitting: Family

## 2018-03-23 ENCOUNTER — Telehealth: Payer: Self-pay | Admitting: Family

## 2018-03-23 ENCOUNTER — Encounter: Payer: Self-pay | Admitting: Family

## 2018-03-23 VITALS — BP 160/100 | HR 66 | Temp 98.5°F | Resp 15 | Ht 66.0 in | Wt 148.5 lb

## 2018-03-23 DIAGNOSIS — Z1239 Encounter for other screening for malignant neoplasm of breast: Secondary | ICD-10-CM

## 2018-03-23 DIAGNOSIS — I714 Abdominal aortic aneurysm, without rupture, unspecified: Secondary | ICD-10-CM

## 2018-03-23 DIAGNOSIS — I2510 Atherosclerotic heart disease of native coronary artery without angina pectoris: Secondary | ICD-10-CM

## 2018-03-23 DIAGNOSIS — F17200 Nicotine dependence, unspecified, uncomplicated: Secondary | ICD-10-CM | POA: Diagnosis not present

## 2018-03-23 DIAGNOSIS — I700 Atherosclerosis of aorta: Secondary | ICD-10-CM

## 2018-03-23 DIAGNOSIS — F339 Major depressive disorder, recurrent, unspecified: Secondary | ICD-10-CM

## 2018-03-23 DIAGNOSIS — R42 Dizziness and giddiness: Secondary | ICD-10-CM | POA: Diagnosis not present

## 2018-03-23 DIAGNOSIS — Z13820 Encounter for screening for osteoporosis: Secondary | ICD-10-CM

## 2018-03-23 DIAGNOSIS — I10 Essential (primary) hypertension: Secondary | ICD-10-CM | POA: Diagnosis not present

## 2018-03-23 LAB — CBC WITH DIFFERENTIAL/PLATELET
BASOS PCT: 0.8 % (ref 0.0–3.0)
Basophils Absolute: 0.1 10*3/uL (ref 0.0–0.1)
Eosinophils Absolute: 0.2 10*3/uL (ref 0.0–0.7)
Eosinophils Relative: 2.2 % (ref 0.0–5.0)
HEMATOCRIT: 48.4 % — AB (ref 36.0–46.0)
HEMOGLOBIN: 16.4 g/dL — AB (ref 12.0–15.0)
LYMPHS PCT: 33.5 % (ref 12.0–46.0)
Lymphs Abs: 2.7 10*3/uL (ref 0.7–4.0)
MCHC: 33.9 g/dL (ref 30.0–36.0)
MCV: 90.7 fl (ref 78.0–100.0)
MONOS PCT: 7.5 % (ref 3.0–12.0)
Monocytes Absolute: 0.6 10*3/uL (ref 0.1–1.0)
Neutro Abs: 4.6 10*3/uL (ref 1.4–7.7)
Neutrophils Relative %: 56 % (ref 43.0–77.0)
Platelets: 225 10*3/uL (ref 150.0–400.0)
RBC: 5.33 Mil/uL — AB (ref 3.87–5.11)
RDW: 14 % (ref 11.5–15.5)
WBC: 8.2 10*3/uL (ref 4.0–10.5)

## 2018-03-23 LAB — VITAMIN D 25 HYDROXY (VIT D DEFICIENCY, FRACTURES): VITD: 16.32 ng/mL — AB (ref 30.00–100.00)

## 2018-03-23 LAB — LIPID PANEL
Cholesterol: 200 mg/dL (ref 0–200)
HDL: 56.6 mg/dL (ref >39.00)
LDL Cholesterol: 127 mg/dL — ABNORMAL HIGH (ref 0–99)
NONHDL: 143.55
Total CHOL/HDL Ratio: 4
Triglycerides: 84 mg/dL (ref 0.0–149.0)
VLDL: 16.8 mg/dL (ref 0.0–40.0)

## 2018-03-23 LAB — COMPREHENSIVE METABOLIC PANEL
ALBUMIN: 4.4 g/dL (ref 3.5–5.2)
ALK PHOS: 69 U/L (ref 39–117)
ALT: 16 U/L (ref 0–35)
AST: 18 U/L (ref 0–37)
BUN: 17 mg/dL (ref 6–23)
CO2: 31 mEq/L (ref 19–32)
CREATININE: 0.91 mg/dL (ref 0.40–1.20)
Calcium: 9.7 mg/dL (ref 8.4–10.5)
Chloride: 104 mEq/L (ref 96–112)
GFR: 64.99 mL/min (ref >60.00)
Glucose, Bld: 91 mg/dL (ref 70–99)
Potassium: 5.3 mEq/L — ABNORMAL HIGH (ref 3.5–5.1)
Sodium: 140 mEq/L (ref 135–145)
TOTAL PROTEIN: 7.1 g/dL (ref 6.0–8.3)
Total Bilirubin: 0.5 mg/dL (ref 0.2–1.2)

## 2018-03-23 LAB — HEMOGLOBIN A1C: HEMOGLOBIN A1C: 5.8 % (ref 4.6–6.5)

## 2018-03-23 LAB — TSH: TSH: 3.05 u[IU]/mL (ref 0.35–4.50)

## 2018-03-23 MED ORDER — ATORVASTATIN CALCIUM 10 MG PO TABS: 10.0000 mg | ORAL_TABLET | Freq: Every day | ORAL | 3 refills | Status: DC

## 2018-03-23 MED ORDER — AMLODIPINE BESYLATE 5 MG PO TABS: 5.0000 mg | ORAL_TABLET | Freq: Every day | ORAL | 3 refills | Status: DC

## 2018-03-23 MED ORDER — BUPROPION HCL ER (SR) 150 MG PO TB12: ORAL_TABLET | ORAL | 2 refills | Status: DC

## 2018-03-23 NOTE — Patient Instructions (Addendum)
Please let me know about ANY episodes of dizziness. I am concerned.   Start your amlodipine again today. Monitor blood pressure,  Goal is less than 120/80, based on newest guidelines; if persistently higher, please make sooner follow up appointment so we can recheck you blood pressure and manage medications  Start lipitor.   Please return for complete physical exam in one month.   We placed a referral for mammogram this year. I asked that you call one the below locations and schedule this when it is convenient for you.   As discussed, I would like you to ask for 3D mammogram over the traditional 2D mammogram as new evidence suggest 3D is superior.   Please note that NOT all insurance companies cover 3D and you may have to pay a higher copay. You may call your insurance company to further clarify your benefits.   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  Please call the office in the next week if you have not heard from me regarding his response.  I want to ensure that we manage this appropriately.  Today we discussed referrals, orders. Cardiology- [facility]   I have placed these orders in the system for you.  Please be sure to give [place] a call if you have not heard from our office regarding this. We should hear from [place] within ONE week with information regarding your appointment. If not, please let me know immediately.    The below is how you take Bupropion hydrochloride, brand name Zyban ( same medication as Wellbutrin)  for smoking cessation:    First 3 days, take 150mg  tablet once  per day ( always take prior to 6pm, usually recommend just taking in the morning). After that, take one tablet twice per day. Goal is stop smoking 5-7 days after starting treatment so ensure you have picked a quit date. I recommend treating for at least 12 weeks.  You may stay on the 150 mg/day (rather than 300 mg/day) if you do not tolerate the full dose due to side effects. Also note that longer-duration therapy may prevent relapse in successful quitters so let me know if you would like longer therapy.    Most of all, GOOD LUCK! Dizziness Dizziness is a common problem. It is a feeling of unsteadiness or light-headedness. You may feel like you are about to faint. Dizziness can lead to injury if you stumble or fall. Anyone can become dizzy, but dizziness is more common in older adults. This condition can be caused by a number of things, including medicines, dehydration, or illness. Follow these instructions at home: Eating and drinking  Drink enough fluid to keep your urine clear or pale yellow. This helps to keep you from becoming dehydrated. Try to drink more clear fluids, such as water.  Do not drink alcohol.  Limit your caffeine intake if told to do so by your health care provider. Check ingredients and nutrition facts to see if a food or beverage contains caffeine.  Limit your salt (sodium) intake if told to do so by your  health care provider. Check ingredients and nutrition facts to see if a food or beverage contains sodium. Activity  Avoid making quick movements. ? Rise slowly from chairs and steady yourself until you feel okay. ? In the morning, first sit up on the side of the bed. When you feel okay, stand slowly while you hold onto something until you know that your balance is fine.  If you need to stand in one place for a long time, move your legs often. Tighten and relax the muscles in your legs while you are standing.  Do not drive or use heavy machinery if you feel  dizzy.  Avoid bending down if you feel dizzy. Place items in your home so that they are easy for you to reach without leaning over. Lifestyle  Do not use any products that contain nicotine or tobacco, such as cigarettes and e-cigarettes. If you need help quitting, ask your health care provider.  Try to reduce your stress level by using methods such as yoga or meditation. Talk with your health care provider if you need help to manage your stress. General instructions  Watch your dizziness for any changes.  Take over-the-counter and prescription medicines only as told by your health care provider. Talk with your health care provider if you think that your dizziness is caused by a medicine that you are taking.  Tell a friend or a family member that you are feeling dizzy. If he or she notices any changes in your behavior, have this person call your health care provider.  Keep all follow-up visits as told by your health care provider. This is important. Contact a health care provider if:  Your dizziness does not go away.  Your dizziness or light-headedness gets worse.  You feel nauseous.  You have reduced hearing.  You have new symptoms.  You are unsteady on your feet or you feel like the room is spinning. Get help right away if:  You vomit or have diarrhea and are unable to eat or drink anything.  You have problems talking, walking, swallowing, or using your arms, hands, or legs.  You feel generally weak.  You are not thinking clearly or you have trouble forming sentences. It may take a friend or family member to notice this.  You have chest pain, abdominal pain, shortness of breath, or sweating.  Your vision changes.  You have any bleeding.  You have a severe headache.  You have neck pain or a stiff neck.  You have a fever. These symptoms may represent a serious problem that is an emergency. Do not wait to see if the symptoms will go away. Get medical help right away.  Call your local emergency services (911 in the [place] Do not drive yourself to the hospital. Summary  Dizziness is a feeling of unsteadiness or light-headedness. This condition can be caused by a number of things, including medicines, dehydration, or illness.  Anyone can become dizzy, but dizziness is more common in older adults.  Drink enough fluid to keep your urine clear or pale yellow. Do not drink alcohol.  Avoid making quick movements if you feel dizzy. Monitor your dizziness for any changes. This information is not intended to replace advice given to you by your health care provider. Make sure you discuss any questions you have with your health care provider. Document Released: 10/12/2000 Document Revised: 05/21/2016 Document Reviewed: 05/21/2016 Elsevier Interactive Patient Education  [employer].

## 2018-03-23 NOTE — Assessment & Plan Note (Signed)
Elevated.  No evidence of hypertensive urgency or emergency at this time.  Reassuring neurologic exam.  Refilled amlodipine.  Patient will monitor blood pressure at home.

## 2018-03-23 NOTE — Assessment & Plan Note (Signed)
Discussed atherosclerosis seen on CT chest.  reviewed the importance of her staying on the Lipitor.  Pending lipid panel.

## 2018-03-23 NOTE — Telephone Encounter (Signed)
FYI-Pt will call and make appt due to not having her calendar.

## 2018-03-23 NOTE — Telephone Encounter (Signed)
noted 

## 2018-03-23 NOTE — Assessment & Plan Note (Signed)
Messaged with Dr Lucky Cowboy. Pending vascular consult for surveillance. Will follow

## 2018-03-23 NOTE — Telephone Encounter (Signed)
Call pt  I heard from vascular, Dr dew, regarding aneursym seen on CT chest.   Here is what he said:  We are happy to see her. That is a small aneurysm at this point and annual CT follow up would be planned. BP control and tobacco cessation would be her best bet to limit growth.    I have placed referral. Advise her to call us if she hasnt heard with an appt in a week

## 2018-03-23 NOTE — Assessment & Plan Note (Addendum)
Reassured by normal neurologic exam.  Frequency of the episodes has improved with deep breathing.  EKG today shows no acute ischemia.  Does show mild sinus bradycardia.  No acute changes from prior EKG done 2017.  Patient does not appear to be orthostatic.  Etiology of dizziness is nonspecific at this time however I am considering structural heart disease, COPD.   pending echocardiogram.  Patient has not seen cardiologist in 2 years, we made appointment for her to be seen December 18 with Dr. Saralyn Pilar. One month f/u. Will consider pulmonology consult at follow up after ECHO, and seeing [facility].

## 2018-03-23 NOTE — Assessment & Plan Note (Signed)
Trial of wellbutrin. She will let me know how she is doing

## 2018-03-23 NOTE — Telephone Encounter (Signed)
Patient advised of below and verbalized understanding. She is aware that she will be contacted in regards to appointment with vascular . If she doesn't hear back in regards to appointment she will call our office back.

## 2018-03-23 NOTE — Progress Notes (Signed)
Subjective:    Patient ID: Tracy Bell, female    DOB: 12/21/1950, 67 y.o.   MRN: [id number]  CC: Tracy Bell is a 67 y.o. female who presents today for follow up.   HPI: Hasnt focused on her health for the past couple of years. 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  Tearful. Lives alone. Has kittens, dogs which she enjoys.  Feeling overwhelmed, and once house is more organized, feels like she will feel better. Sleeping well.  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 No si/hi. No h/o suicide attempts.   Complains of SOB, dizziness episodes for past several months. Occur every couple of months, not increasing. Episodes had been more frequent in the past however 'now that im concentrating on my breathing, it has improved. ' No wheezing. Has dry cough in the morning. Describes episodes where standing such as preparing a meal and feels lightheaded. Not lightheaded upon standing. Endorses palpitations.  Will lay down and take deep breaths, and symptoms resolve. No syncope.   Denies exertional chest pain or pressure, numbness or tingling radiating to left arm or jaw, frequent headaches, changes in vision, vertigo, hearing changes, tinnitus.    HTN- not taking amlodipine. Not checking blood pressure.  No cp, HA, vision changes.   HLD- not taking lipitor.   Would like to quit smoking . Willing to try a medication. Rare alcohol use.   Mammogram overdue; cannot open mammogram from 2015. Patent reports done in [place].   Declines flu and pneumonia vaccines.  CT chest- heart size normal. Aortic atherosclerosis. Small pulmonary nodules. Diffuse bronchial thickening, emphysema.Ectasia of ascending thoracic aorta.  LLUNG RADS 2S.   Last saw [place] 12/2015; to follow-up in 3 months  HISTORY:  Past Medical History:  Diagnosis Date  . Personal history of tobacco use, presenting hazards to health 08/28/2015   Past Surgical History:  Procedure Laterality Date    . ABDOMINAL HYSTERECTOMY  1980  . BREAST BIOPSY     normal   Family History  Problem Relation Age of Onset  . Cancer Mother        Pancreatic Cancer   . Cancer Father        Stomach Cancer   . Cancer Sister   . Cancer Sister        ovarian    Allergies: Other Current Outpatient Medications on File Prior to Visit  Medication Sig Dispense Refill  . aspirin EC 81 MG tablet Take 1 tablet (81 mg total) by mouth daily. 30 tablet 6  . Multiple Vitamins-Minerals (CENTRUM ADULTS) TABS Take 1 tablet by mouth daily. 30 tablet 6   No current facility-administered medications on file prior to visit.     Social History   Tobacco Use  . Smoking status: Current Some Day Smoker  . Smokeless tobacco: Never Used  Substance Use Topics  . Alcohol use: No    Alcohol/week: 0.0 standard drinks  . Drug use: No    Review of Systems  Constitutional: Negative for chills and fever.  HENT: Negative for congestion.   Respiratory: Positive for cough and shortness of breath. Negative for wheezing.   Cardiovascular: Positive for leg swelling. Negative for chest pain and palpitations.  Gastrointestinal: Negative for nausea and vomiting.  Neurological: Positive for dizziness. Negative for seizures, syncope and headaches.      Objective:    BP (!) 160/100 (BP Location: Left Arm, Cuff Size: Normal)   Pulse 66  Temp 98.5 F (36.9 C) (Oral)   Resp 15   Ht 5\' 6"  (1.676 m)   Wt 148 lb 8 oz (67.4 kg)   SpO2 94%   BMI 23.97 kg/m  BP Readings from Last 3 Encounters:  03/23/18 (!) 160/100  12/22/15 122/82  11/17/15 (!) 158/90   Wt Readings from Last 3 Encounters:  03/23/18 148 lb 8 oz (67.4 kg)  03/21/18 140 lb (63.5 kg)  03/21/17 145 lb (65.8 kg)    Physical Exam  Constitutional: She appears well-developed and well-nourished.  HENT:  Mouth/Throat: Uvula is midline, oropharynx is clear and moist and mucous membranes are normal.  Eyes: Pupils are equal, round, and reactive to light.  Conjunctivae and EOM are normal.  Fundus normal bilaterally.   Cardiovascular: Normal rate, regular rhythm, normal heart sounds and normal pulses.  Pulmonary/Chest: Effort normal and breath sounds normal. She has no wheezes. She has no rhonchi. She has no rales.  Neurological: She is alert. She has normal strength. No cranial nerve deficit or sensory deficit. She displays a negative Romberg sign.  Reflex Scores:      Bicep reflexes are 2+ on the right side and 2+ on the left side.      Patellar reflexes are 2+ on the right side and 2+ on the left side. Grip equal and strong bilateral upper extremities. Gait strong and steady. Able to perform rapid alternating movement without difficulty.   Skin: Skin is warm and dry.  Psychiatric: She has a normal mood and affect. Her speech is normal and behavior is normal. Thought content normal.  Vitals reviewed.      Assessment & Plan:   Problem List Items Addressed This Visit      Cardiovascular and Mediastinum   Atherosclerosis of aorta (HCC)   Relevant Medications   amLODipine (NORVASC) 5 MG tablet   atorvastatin (LIPITOR) 10 MG tablet   Essential hypertension - Primary    Elevated.  No evidence of hypertensive urgency or emergency at this time.  Reassuring neurologic exam.  Refilled amlodipine.  Patient will monitor blood pressure at home.      Relevant Medications   amLODipine (NORVASC) 5 MG tablet   atorvastatin (LIPITOR) 10 MG tablet   Other Relevant Orders   Ambulatory referral to Cardiology   EKG 12-Lead (Completed)   ECHOCARDIOGRAM COMPLETE   TSH   CBC with Differential/Platelet   Comprehensive metabolic panel   Hemoglobin A1c   Lipid panel   VITAMIN D 25 Hydroxy (Vit-D Deficiency, Fractures)   Coronary atherosclerosis    Discussed atherosclerosis seen on CT chest.  reviewed the importance of her staying on the Lipitor.  Pending lipid panel.      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 Pending vascular consult for surveillance. Will follow      Relevant Medications   amLODipine (NORVASC) 5 MG tablet   atorvastatin (LIPITOR) 10 MG tablet     Other   Tobacco use disorder    Trial of wellbutrin. She will let me know how she is doing      Relevant Medications   buPROPion (WELLBUTRIN SR) 150 MG 12 hr tablet   Depression, recurrent (HCC)    Suspect Grief reaction.  Declines counseling, medication today. Close follow up.       Relevant Medications   buPROPion (WELLBUTRIN SR) 150 MG 12 hr tablet  Dizziness    Reassured by normal neurologic exam.  Frequency of the episodes has improved with deep breathing.  EKG today shows no acute ischemia.  Does show mild sinus bradycardia.  No acute changes from prior EKG done 2017.  Patient does not appear to be orthostatic.  Etiology of dizziness is nonspecific at this time however I am considering structural heart disease, COPD.   pending echocardiogram.  Patient has not seen cardiologist in 2 years, we made appointment for her to be seen December 18 with Dr. Saralyn Pilar. One month f/u. Will consider pulmonology consult at follow up after ECHO, and seeing [facility].        Other Visit Diagnoses    Screening for breast cancer       Relevant Orders   MM 3D SCREEN BREAST BILATERAL   Screening for osteoporosis       Relevant Orders   DG Bone Density       I am having Milta M. Brotherton start on buPROPion. I am also having her maintain her aspirin EC, CENTRUM ADULTS, amLODipine, and atorvastatin.   Meds ordered this encounter  Medications  . amLODipine (NORVASC) 5 MG tablet    Sig: Take 1 tablet (5 mg total) by mouth daily.    Dispense:  90 tablet    Refill:  3    Order Specific Question:   Supervising Provider    Answer:   Deborra Medina L [[id number]]  . atorvastatin (LIPITOR) 10 MG tablet    Sig: Take 1 tablet (10 mg total) by mouth daily.    Dispense:   90 tablet    Refill:  3    Order Specific Question:   Supervising Provider    Answer:   Deborra Medina L [[id number]]  . buPROPion (WELLBUTRIN SR) 150 MG 12 hr tablet    Sig: Take one tablet by mouth daily for 3 days, and then increase to one tablet by mouth twice daily.    Dispense:  60 tablet    Refill:  2    Order Specific Question:   Supervising Provider    Answer:   Crecencio Mc [[id number]]    Return precautions given.   Risks, benefits, and alternatives of the medications and treatment plan prescribed today were discussed, and patient expressed understanding.   Education regarding symptom management and diagnosis given to patient on AVS.  Continue to follow with Burnard Hawthorne, FNP for routine health maintenance.   Leliana M Barsch and I agreed with plan.   [name], FNP

## 2018-03-23 NOTE — Assessment & Plan Note (Addendum)
Suspect Grief reaction.  Declines counseling, medication today. Close follow up.

## 2018-03-27 ENCOUNTER — Encounter: Payer: Self-pay | Admitting: *Deleted

## 2018-03-27 NOTE — Progress Notes (Signed)
Spoke with patient, she states she hasn't experienced any dizzy episodes, since office visit.   She is going to call today to set up mammogram.

## 2018-03-28 ENCOUNTER — Other Ambulatory Visit: Payer: Self-pay | Admitting: Family

## 2018-03-28 ENCOUNTER — Encounter: Payer: Self-pay | Admitting: Family

## 2018-03-28 DIAGNOSIS — E875 Hyperkalemia: Secondary | ICD-10-CM

## 2018-04-03 ENCOUNTER — Other Ambulatory Visit: Payer: Self-pay | Admitting: Family

## 2018-04-03 ENCOUNTER — Other Ambulatory Visit (INDEPENDENT_AMBULATORY_CARE_PROVIDER_SITE_OTHER): Payer: Medicare Other

## 2018-04-03 DIAGNOSIS — E875 Hyperkalemia: Secondary | ICD-10-CM | POA: Diagnosis not present

## 2018-04-03 DIAGNOSIS — R718 Other abnormality of red blood cells: Secondary | ICD-10-CM

## 2018-04-03 LAB — BASIC METABOLIC PANEL
BUN: 14 mg/dL (ref 6–23)
CHLORIDE: 102 meq/L (ref 96–112)
CO2: 30 meq/L (ref 19–32)
Calcium: 9.5 mg/dL (ref 8.4–10.5)
Creatinine, Ser: 0.87 mg/dL (ref 0.40–1.20)
GFR: 68.45 mL/min (ref >60.00)
GLUCOSE: 93 mg/dL (ref 70–99)
POTASSIUM: 4.8 meq/L (ref 3.5–5.1)
Sodium: 138 mEq/L (ref 135–145)

## 2018-04-04 ENCOUNTER — Telehealth: Payer: Self-pay | Admitting: Family

## 2018-04-04 NOTE — Telephone Encounter (Signed)
Copied from [place] [phone]. Topic: Quick Communication - See Telephone Encounter >> Apr 04, 2018 11:06 AM Rutherford Nail, NT wrote: CRM for notification. See Telephone encounter for: 04/04/18. Patient calling and states that she was speaking with Lydia Guiles about an echocardiogram and had to check on some things and was going to give her a call back. Patient would like a call back from [place]. Please advise.  CB#: [phone]

## 2018-04-04 NOTE — Telephone Encounter (Signed)
Thank you! Spoke with pt.  °

## 2018-04-05 DIAGNOSIS — R42 Dizziness and giddiness: Secondary | ICD-10-CM | POA: Diagnosis not present

## 2018-04-05 DIAGNOSIS — I350 Nonrheumatic aortic (valve) stenosis: Secondary | ICD-10-CM | POA: Diagnosis not present

## 2018-04-05 DIAGNOSIS — R0602 Shortness of breath: Secondary | ICD-10-CM | POA: Insufficient documentation

## 2018-04-05 DIAGNOSIS — E7800 Pure hypercholesterolemia, unspecified: Secondary | ICD-10-CM | POA: Diagnosis not present

## 2018-04-05 DIAGNOSIS — R002 Palpitations: Secondary | ICD-10-CM | POA: Diagnosis not present

## 2018-04-05 DIAGNOSIS — I10 Essential (primary) hypertension: Secondary | ICD-10-CM | POA: Diagnosis not present

## 2018-04-05 DIAGNOSIS — J410 Simple chronic bronchitis: Secondary | ICD-10-CM | POA: Diagnosis not present

## 2018-04-05 DIAGNOSIS — I2510 Atherosclerotic heart disease of native coronary artery without angina pectoris: Secondary | ICD-10-CM | POA: Diagnosis not present

## 2018-04-05 DIAGNOSIS — R079 Chest pain, unspecified: Secondary | ICD-10-CM | POA: Diagnosis not present

## 2018-04-10 ENCOUNTER — Other Ambulatory Visit: Payer: Self-pay

## 2018-04-10 ENCOUNTER — Ambulatory Visit (INDEPENDENT_AMBULATORY_CARE_PROVIDER_SITE_OTHER): Payer: Medicare Other | Admitting: Vascular Surgery

## 2018-04-10 ENCOUNTER — Encounter (INDEPENDENT_AMBULATORY_CARE_PROVIDER_SITE_OTHER): Payer: Self-pay | Admitting: Vascular Surgery

## 2018-04-10 VITALS — BP 122/85 | HR 85 | Resp 16 | Ht 66.0 in | Wt 147.2 lb

## 2018-04-10 DIAGNOSIS — F17200 Nicotine dependence, unspecified, uncomplicated: Secondary | ICD-10-CM

## 2018-04-10 DIAGNOSIS — F17210 Nicotine dependence, cigarettes, uncomplicated: Secondary | ICD-10-CM | POA: Diagnosis not present

## 2018-04-10 DIAGNOSIS — R0989 Other specified symptoms and signs involving the circulatory and respiratory systems: Secondary | ICD-10-CM | POA: Diagnosis not present

## 2018-04-10 DIAGNOSIS — E7800 Pure hypercholesterolemia, unspecified: Secondary | ICD-10-CM

## 2018-04-10 DIAGNOSIS — I10 Essential (primary) hypertension: Secondary | ICD-10-CM | POA: Diagnosis not present

## 2018-04-10 DIAGNOSIS — I712 Thoracic aortic aneurysm, without rupture, unspecified: Secondary | ICD-10-CM | POA: Insufficient documentation

## 2018-04-10 DIAGNOSIS — I2510 Atherosclerotic heart disease of native coronary artery without angina pectoris: Secondary | ICD-10-CM

## 2018-04-10 NOTE — Progress Notes (Signed)
Patient ID: Tracy Bell, female   DOB: 04-Mar-1951, 67 y.o.   MRN: [id number]  Chief Complaint  Patient presents with  . New Patient (Initial Visit)    Abdominal Aneursym    HPI Tracy Bell is a 67 y.o. female.  I am asked to see the patient by Dr. Vidal Schwalbe for evaluation of thoracic aortic aneurysms seen on a recent CT of the chest.  The patient reports that she has been having CTs basically every year.  She has a long history of tobacco use and continues to smoke.  She had no previous knowledge or history of aneurysm.  I have independently reviewed her CT scan and she does have an approximately 4.3 cm ascending thoracic aortic aneurysm.  The descending thoracic aorta and the peri-visceral aorta appear to be somewhat ectatic although not frankly aneurysmal.  This is the lowest the scan goes so further down in the abdominal aorta is not well seen. The patient is also concerned by a palpable area in her left neck which has a pulse.  Her husband who has now passed away had carotid disease so she is concerned about this.  No focal neurologic symptoms. Specifically, the patient denies amaurosis fugax, speech or swallowing difficulties, or arm or leg weakness or numbness    Past Medical History:  Diagnosis Date  . Personal history of tobacco use, presenting hazards to health 08/28/2015    Past Surgical History:  Procedure Laterality Date  . ABDOMINAL HYSTERECTOMY  1980  . BREAST BIOPSY     normal    Family History  Problem Relation Age of Onset  . Cancer Mother        Pancreatic Cancer   . Cancer Father        Stomach Cancer   . Cancer Sister   . Cancer Sister        ovarian    Social History Social History   Tobacco Use  . Smoking status: Current Some Day Smoker  . Smokeless tobacco: Never Used  Substance Use Topics  . Alcohol use: No    Alcohol/week: 0.0 standard drinks  . Drug use: No     No Known Allergies  Current Outpatient Medications  Medication Sig  Dispense Refill  . amLODipine (NORVASC) 5 MG tablet Take 1 tablet (5 mg total) by mouth daily. 90 tablet 3  . aspirin EC 81 MG tablet Take 1 tablet (81 mg total) by mouth daily. 30 tablet 6  . atorvastatin (LIPITOR) 10 MG tablet Take 1 tablet (10 mg total) by mouth daily. 90 tablet 3  . buPROPion (WELLBUTRIN SR) 150 MG 12 hr tablet Take one tablet by mouth daily for 3 days, and then increase to one tablet by mouth twice daily. 60 tablet 2  . Multiple Vitamins-Minerals (CENTRUM ADULTS) TABS Take 1 tablet by mouth daily. 30 tablet 6   No current facility-administered medications for this visit.       REVIEW OF SYSTEMS (Negative unless checked)  Constitutional: [] Weight loss  [] Fever  [] Chills Cardiac: [] Chest pain   [] Chest pressure   [] Palpitations   [] Shortness of breath when laying flat   [] Shortness of breath at rest   [x] Shortness of breath with exertion. Vascular:  [] Pain in legs with walking   [] Pain in legs at rest   [] Pain in legs when laying flat   [] Claudication   [] Pain in feet when walking  [] Pain in feet at rest  [] Pain in feet when laying flat   []   History of DVT   [] Phlebitis   [] Swelling in legs   [] Varicose veins   [] Non-healing ulcers Pulmonary:   [] Uses home oxygen   [] Productive cough   [] Hemoptysis   [] Wheeze  [] COPD   [] Asthma Neurologic:  [] Dizziness  [] Blackouts   [] Seizures   [] History of stroke   [] History of TIA  [] Aphasia   [] Temporary blindness   [] Dysphagia   [] Weakness or numbness in arms   [] Weakness or numbness in legs Musculoskeletal:  [x] Arthritis   [] Joint swelling   [] Joint pain   [] Low back pain Hematologic:  [] Easy bruising  [] Easy bleeding   [] Hypercoagulable state   [] Anemic  [] Hepatitis Gastrointestinal:  [] Blood in stool   [] Vomiting blood  [] Gastroesophageal reflux/heartburn   [] Abdominal pain Genitourinary:  [] Chronic kidney disease   [] Difficult urination  [] Frequent urination  [] Burning with urination   [] Hematuria Skin:  [] Rashes   [] Ulcers    [] Wounds Psychological:  [] History of anxiety   []  History of major depression.    Physical Exam BP 122/85 (BP Location: Right Arm, Patient Position: Sitting)   Pulse 85   Resp 16   Ht 5\' 6"  (1.676 m)   Wt 147 lb 3.2 oz (66.8 kg)   BMI 23.76 kg/m  Gen:  WD/WN, NAD Head: [place]/AT, No temporalis wasting. Ear/Nose/Throat: Hearing grossly intact, nares w/o erythema or drainage, oropharynx w/o Erythema/Exudate Eyes: Conjunctiva clear, sclera non-icteric  Neck: trachea midline.  Left carotid bruit is present Pulmonary:  Good air movement, clear to auscultation bilaterally.  Cardiac: RRR, normal S1, S2, no Murmurs, rubs or gallops. Vascular:  Vessel Right Left  Radial Palpable Palpable                                   Gastrointestinal: soft, non-tender/non-distended. No increased aortic impulse Musculoskeletal: M/S 5/5 throughout.  Extremities without ischemic changes.  No deformity or atrophy. No edema. Neurologic: Sensation grossly intact in extremities.  Symmetrical.  Speech is fluent. Motor exam as listed above. Psychiatric: Judgment intact, Mood & affect appropriate for pt's clinical situation. Dermatologic: No rashes or ulcers noted.  No cellulitis or open wounds.   Data Reviewed CT of chest as above  Radiology Ct Chest Lung Cancer Screening Low Dose Wo Contrast  Result Date: 03/21/2018 CLINICAL DATA:  67 year old female current smoker with 55 pack-year history of smoking. Lung cancer screening examination. EXAM: CT CHEST WITHOUT CONTRAST LOW-DOSE FOR LUNG CANCER SCREENING TECHNIQUE: Multidetector CT imaging of the chest was performed following the standard protocol without IV contrast. COMPARISON:  Low-dose lung cancer screening chest CT 03/21/2018. FINDINGS: Cardiovascular: Heart size is normal. There is no significant pericardial fluid, thickening or pericardial calcification. There is aortic atherosclerosis, as well as atherosclerosis of the great vessels of the  mediastinum and the coronary arteries, including calcified atherosclerotic plaque in the left main, left anterior descending, left circumflex and right coronary arteries. Calcifications of the aortic valve and mitral annulus. Ectasia of ascending thoracic aorta (4.3 cm in diameter). Mediastinum/Nodes: No pathologically enlarged mediastinal or hilar lymph nodes. Please note that accurate exclusion of hilar adenopathy is limited on noncontrast CT scans. Esophagus is unremarkable in appearance. No axillary lymphadenopathy. Lungs/Pleura: A small pulmonary nodules again noted, largest of which is a mixed solid and sub solid lesion in the anterior aspect of the left upper lobe with a volume derived mean diameter of 8.2 and 3.4 mm for the sub solid and solid components respectively. No  larger more suspicious appearing pulmonary nodules or masses are noted. No acute consolidative airspace disease. No pleural effusions. Diffuse bronchial wall thickening with mild centrilobular and paraseptal emphysema. Upper Abdomen: Aortic atherosclerosis. Musculoskeletal: There are no aggressive appearing lytic or blastic lesions noted in the visualized portions of the skeleton. IMPRESSION: 1. Lung-RADS 2S, benign appearance or behavior. Continue annual screening with low-dose chest CT without contrast in 12 months. 2. The "S" modifier above refers to potentially clinically significant non lung cancer related findings. Specifically, there is aortic atherosclerosis, in addition to left main and 3 vessel coronary artery disease. Please note that although the presence of coronary artery calcium documents the presence of coronary artery disease, the severity of this disease and any potential stenosis cannot be assessed on this non-gated CT examination. Assessment for potential risk factor modification, dietary therapy or pharmacologic therapy may be warranted, if clinically indicated. 3. Mild diffuse bronchial wall thickening with mild  centrilobular and paraseptal emphysema; imaging findings suggestive of underlying COPD. 4. There are calcifications of the aortic valve and mitral annulus. Echocardiographic correlation for evaluation of potential valvular dysfunction may be warranted if clinically indicated. 5. Ectasia of ascending thoracic aorta (4.3 cm in diameter). Attention at time of repeat annual lung cancer screening examination is recommended to ensure continued stability of this finding. Aortic Atherosclerosis (ICD10-I70.0) and Emphysema (ICD10-J43.9). Electronically Signed   By: Vinnie Langton M.D.   On: 03/21/2018 15:16    Labs Recent Results (from the past 2160 hour(s))  TSH     Status: None   Collection Time: 03/23/18 10:10 AM  Result Value Ref Range   TSH 3.05 0.35 - 4.50 uIU/mL  CBC with Differential/Platelet     Status: Abnormal   Collection Time: 03/23/18 10:10 AM  Result Value Ref Range   WBC 8.2 4.0 - 10.5 K/uL   RBC 5.33 (H) 3.87 - 5.11 Mil/uL   Hemoglobin 16.4 (H) 12.0 - 15.0 g/dL   HCT 48.4 (H) 36.0 - 46.0 %   MCV 90.7 78.0 - 100.0 fl   MCHC 33.9 30.0 - 36.0 g/dL   RDW 14.0 11.5 - 15.5 %   Platelets 225.0 150.0 - 400.0 K/uL   Neutrophils Relative % 56.0 43.0 - 77.0 %   Lymphocytes Relative 33.5 12.0 - 46.0 %   Monocytes Relative 7.5 3.0 - 12.0 %   Eosinophils Relative 2.2 0.0 - 5.0 %   Basophils Relative 0.8 0.0 - 3.0 %   Neutro Abs 4.6 1.4 - 7.7 K/uL   Lymphs Abs 2.7 0.7 - 4.0 K/uL   Monocytes Absolute 0.6 0.1 - 1.0 K/uL   Eosinophils Absolute 0.2 0.0 - 0.7 K/uL   Basophils Absolute 0.1 0.0 - 0.1 K/uL  Comprehensive metabolic panel     Status: Abnormal   Collection Time: 03/23/18 10:10 AM  Result Value Ref Range   Sodium 140 135 - 145 mEq/L   Potassium 5.3 (H) 3.5 - 5.1 mEq/L   Chloride 104 96 - 112 mEq/L   CO2 31 19 - 32 mEq/L   Glucose, Bld 91 70 - 99 mg/dL   BUN 17 6 - 23 mg/dL   Creatinine, Ser 0.91 0.40 - 1.20 mg/dL   Total Bilirubin 0.5 0.2 - 1.2 mg/dL   Alkaline Phosphatase 69  39 - 117 U/L   AST 18 0 - 37 U/L   ALT 16 0 - 35 U/L   Total Protein 7.1 6.0 - 8.3 g/dL   Albumin 4.4 3.5 - 5.2 g/dL   Calcium  9.7 8.4 - 10.5 mg/dL   GFR 64.99 >60.00 mL/min  Hemoglobin A1c     Status: None   Collection Time: 03/23/18 10:10 AM  Result Value Ref Range   Hgb A1c MFr Bld 5.8 4.6 - 6.5 %    Comment: Glycemic Control Guidelines for People with Diabetes:Non Diabetic:  <6%Goal of Therapy: <7%Additional Action Suggested:  >8%   Lipid panel     Status: Abnormal   Collection Time: 03/23/18 10:10 AM  Result Value Ref Range   Cholesterol 200 0 - 200 mg/dL    Comment: ATP III Classification       Desirable:  < 200 mg/dL               Borderline High:  200 - 239 mg/dL          High:  > = 240 mg/dL   Triglycerides 84.0 0.0 - 149.0 mg/dL    Comment: Normal:  <150 mg/dLBorderline High:  150 - 199 mg/dL   HDL 56.60 >39.00 mg/dL   VLDL 16.8 0.0 - 40.0 mg/dL   LDL Cholesterol 127 (H) 0 - 99 mg/dL   Total CHOL/HDL Ratio 4     Comment:                Men          Women1/2 Average Risk     3.4          3.3Average Risk          5.0          4.42X Average Risk          9.6          7.13X Average Risk          15.0          11.0                       NonHDL 143.55     Comment: NOTE:  Non-HDL goal should be 30 mg/dL higher than patient's LDL goal (i.e. LDL goal of < 70 mg/dL, would have non-HDL goal of < 100 mg/dL)  VITAMIN D 25 Hydroxy (Vit-D Deficiency, Fractures)     Status: Abnormal   Collection Time: 03/23/18 10:10 AM  Result Value Ref Range   VITD 16.32 (L) 30.00 - 100.00 ng/mL  Basic metabolic panel     Status: None   Collection Time: 04/03/18 10:38 AM  Result Value Ref Range   Sodium 138 135 - 145 mEq/L   Potassium 4.8 3.5 - 5.1 mEq/L   Chloride 102 96 - 112 mEq/L   CO2 30 19 - 32 mEq/L   Glucose, Bld 93 70 - 99 mg/dL   BUN 14 6 - 23 mg/dL   Creatinine, Ser 0.87 0.40 - 1.20 mg/dL   Calcium 9.5 8.4 - 10.5 mg/dL   GFR 68.45 >60.00 mL/min    Assessment/Plan:  Essential  hypertension blood pressure control important in reducing the progression of atherosclerotic disease. On appropriate oral medications.   Tobacco use disorder We had a discussion for approximately 3 minutes regarding the absolute need for smoking cessation due to the deleterious nature of tobacco on the vascular system. We discussed the tobacco use would diminish patency of any intervention, and likely significantly worsen progressio of disease. We discussed multiple agents for quitting including replacement therapy or medications to reduce cravings such as Chantix. The patient voices their understanding of the importance of smoking cessation.  Hypercholesteremia lipid control important in reducing the progression of atherosclerotic disease. Continue statin therapy   Left carotid bruit In a patient with multiple atherosclerotic risk factors and continued tobacco use with a carotid bruit, I think carotid duplex would be prudent for further evaluation.  This can be done at her convenience.  Thoracic aortic aneurysm without rupture ([place]) I have independently reviewed her CT scan and she does have an approximately 4.3 cm ascending thoracic aortic aneurysm.  The descending thoracic aorta and the peri-visceral aorta appear to be somewhat ectatic although not frankly aneurysmal.  This is the lowest the scan goes so further down in the abdominal aorta is not well seen. This is a low risk situation and the aneurysm poses her no immediate risk.  This should be checked in 1 year with a CT scan.  We will add an abdomen and pelvis to the CT scan as there was some ectasia of the aorta more distally in the abdominal aorta and iliac arteries are not evaluated on this study.  We discussed smoking cessation is of paramount importance to avoid growth as his blood pressure control.      Leotis Pain 04/10/2018, 12:34 PM   This note was created with Dragon medical transcription system.  Any errors from dictation  are unintentional.

## 2018-04-10 NOTE — Assessment & Plan Note (Signed)
lipid control important in reducing the progression of atherosclerotic disease. Continue statin therapy  

## 2018-04-10 NOTE — Assessment & Plan Note (Signed)
I have independently reviewed her CT scan and she does have an approximately 4.3 cm ascending thoracic aortic aneurysm.  The descending thoracic aorta and the peri-visceral aorta appear to be somewhat ectatic although not frankly aneurysmal.  This is the lowest the scan goes so further down in the abdominal aorta is not well seen. This is a low risk situation and the aneurysm poses her no immediate risk.  This should be checked in 1 year with a CT scan.  We will add an abdomen and pelvis to the CT scan as there was some ectasia of the aorta more distally in the abdominal aorta and iliac arteries are not evaluated on this study.  We discussed smoking cessation is of paramount importance to avoid growth as his blood pressure control.

## 2018-04-10 NOTE — Assessment & Plan Note (Signed)
In a patient with multiple atherosclerotic risk factors and continued tobacco use with a carotid bruit, I think carotid duplex would be prudent for further evaluation.  This can be done at her convenience.

## 2018-04-10 NOTE — Assessment & Plan Note (Signed)
blood pressure control important in reducing the progression of atherosclerotic disease. On appropriate oral medications.  

## 2018-04-10 NOTE — Patient Instructions (Signed)
Thoracic Aortic Aneurysm An aneurysm is a bulge in an artery. It happens when blood pushes up against a weakened or damaged artery wall. A thoracic aortic aneurysm is an aneurysm that occurs in the first part of the aorta, between the heart and the diaphragm. The aorta is the main artery of the body. It supplies blood from the heart to the rest of the body. Some aneurysms may not cause symptoms or problems. However, the major concern with a thoracic aortic aneurysm is that it can enlarge and burst (rupture), or blood can flow between the layers of the wall of the aorta through a tear (aorticdissection). Both of these conditions can cause bleeding inside the body and can be life-threatening if they are not diagnosed and treated right away. What are the causes? The exact cause of this condition is not known. What increases the risk? The following factors may make you more likely to develop this condition:  Being age 65 or older.  Having a hardening of the arteries caused by the buildup of fat and other substances in the lining of a blood vessel (arteriosclerosis).  Having inflammation of the walls of an artery (arteritis).  Having a genetic disease that weakens the body's connective tissue, such as Marfan syndrome.  Having an injury or trauma to the aorta.  Having an infection that is caused by bacteria, such as syphilis or staphylococcus, in the wall of the aorta (infectious aortitis).  Having high blood pressure (hypertension).  Being female.  Being white (Caucasian).  Having high cholesterol.  Having a family history of aneurysms.  Using tobacco.  Having chronic obstructive pulmonary disease (COPD).  What are the signs or symptoms? Symptoms of this condition vary depending on the size and rate of growth of the aneurysm. Most grow slowly and do not cause any symptoms. When symptoms do occur, they may include:  Pain in the chest, back, sides, or abdomen. The pain may vary in  intensity. A sudden onset of severe pain may indicate that the aneurysm has ruptured.  Hoarseness.  Cough.  Shortness of breath.  Swallowing problems.  Swelling in the face, arms, or legs.  Fever.  Unexplained weight loss.  How is this diagnosed? This condition may be diagnosed with:  An ultrasound.  X-rays.  A CT scan.  An MRI.  Tests to check the arteries for damage or blockages (angiogram).  Most unruptured thoracic aortic aneurysms cause no symptoms, so they are often found during exams for other conditions. How is this treated? Treatment for this condition depends on:  The size of the aneurysm.  How fast the aneurysm is growing.  Your age.  Risk factors for rupture.  Aneurysms that are smaller than 2.2 inches (5.5 cm) may be managed by using medicines to control blood pressure, manage pain, or fight infection. You may need regular monitoring to see if the aneurysm is getting bigger. Your health care provider may recommend that you have an ultrasound every year or every 6 months. How often you need to have an ultrasound depends on the size of the aneurysm, how fast it is growing, and whether you have a family history of aneurysms. Surgical repair may be needed if your aneurysm is larger than 2.2 inches or if it is growing quickly. Follow these instructions at home: Eating and drinking  Eat a healthy diet. Your health care provider may recommend that you: ? Lower your salt (sodium) intake. In some people, too much salt can raise blood pressure and increase   the risk of thoracic aortic aneurysm. ? Avoid foods that are high in saturated fat and cholesterol, such as red meat and dairy. ? Eat a diet that is low in sugar. ? Increase your fiber intake by including whole grains, vegetables, and fruits in your diet. Eating these foods may help to lower blood pressure.  Limit or avoid alcohol as recommended by your health care provider. Lifestyle  Follow instructions  from your health care provider about healthy lifestyle habits. Your health care provider may recommend that you: ? Do not use any products that contain nicotine or tobacco, such as cigarettes and e-cigarettes. If you need help quitting, ask your health care provider. ? Keep your blood pressure within normal limits. The target limit for most people is below 120/80. Check your blood pressure regularly. If it is high, ask your health care provider about ways that you can control it. ? Keep your blood sugar (glucose) level and cholesterol levels within normal limits. Target limits for most people are:  Blood glucose level: Less than 100 mg/dL.  Total cholesterol level: Less than 200 mg/dL. ? Maintain a healthy weight. Activity  Stay physically active and exercise regularly. Talk with your health care provider about how often you should exercise and ask which types of exercise are safe for you.  Avoid heavy lifting and activities that take a lot of effort (are strenuous). Ask your health care provider what activities are safe for you. General instructions  Keep all follow-up visits as told by your health care provider. This is important. ? Talk with your health care provider about regular screenings to see if the aneurysm is getting bigger.  Take over-the-counter and prescription medicines only as told by your health care provider. Contact a health care provider if:  You have discomfort in your upper back, neck, or abdomen.  You have trouble swallowing.  You have a cough or hoarseness.  You have a family history of aneurysms.  You have unexplained weight loss. Get help right away if:  You have sudden, severe pain in your upper back and abdomen. This pain may move into your chest and arms.  You have shortness of breath.  You have a fever. This information is not intended to replace advice given to you by your health care provider. Make sure you discuss any questions you have with your  health care provider. Document Released: 04/18/2005 Document Revised: 01/29/2016 Document Reviewed: 01/29/2016 Elsevier Interactive Patient Education  2018 [employer]  

## 2018-04-10 NOTE — Assessment & Plan Note (Signed)

## 2018-04-13 ENCOUNTER — Other Ambulatory Visit: Payer: Self-pay

## 2018-04-13 ENCOUNTER — Encounter: Payer: Self-pay | Admitting: Oncology

## 2018-04-13 ENCOUNTER — Inpatient Hospital Stay: Payer: Medicare Other

## 2018-04-13 ENCOUNTER — Inpatient Hospital Stay: Payer: Medicare Other | Attending: Oncology | Admitting: Oncology

## 2018-04-13 VITALS — BP 155/91 | HR 72 | Temp 96.9°F | Resp 18 | Ht 66.0 in | Wt 150.0 lb

## 2018-04-13 DIAGNOSIS — D751 Secondary polycythemia: Secondary | ICD-10-CM | POA: Diagnosis not present

## 2018-04-13 DIAGNOSIS — F17210 Nicotine dependence, cigarettes, uncomplicated: Secondary | ICD-10-CM | POA: Insufficient documentation

## 2018-04-13 DIAGNOSIS — R011 Cardiac murmur, unspecified: Secondary | ICD-10-CM | POA: Diagnosis not present

## 2018-04-13 DIAGNOSIS — Z87891 Personal history of nicotine dependence: Secondary | ICD-10-CM

## 2018-04-13 LAB — CBC WITH DIFFERENTIAL/PLATELET
HCT: 49.8 % — ABNORMAL HIGH (ref 36.0–46.0)
Hemoglobin: 16.3 g/dL — ABNORMAL HIGH (ref 12.0–15.0)
MCH: 29.5 pg (ref 26.0–34.0)
MCHC: 32.7 g/dL (ref 30.0–36.0)
MCV: 90.1 fL (ref 80.0–100.0)
Platelets: 217 10*3/uL (ref 150–400)
RBC: 5.53 MIL/uL — ABNORMAL HIGH (ref 3.87–5.11)
RDW: 13.4 % (ref 11.5–15.5)
WBC: 8.8 10*3/uL (ref 4.0–10.5)
nRBC: 0 % (ref 0.0–0.2)

## 2018-04-13 LAB — DIFFERENTIAL
Abs Immature Granulocytes: 0.02 10*3/uL (ref 0.00–0.07)
Basophils Absolute: 0.1 10*3/uL (ref 0.0–0.1)
Basophils Relative: 1 %
Eosinophils Absolute: 0.2 10*3/uL (ref 0.0–0.5)
Eosinophils Relative: 3 %
Immature Granulocytes: 0 %
Lymphocytes Relative: 26 %
Lymphs Abs: 2.3 10*3/uL (ref 0.7–4.0)
MONOS PCT: 7 %
Monocytes Absolute: 0.6 10*3/uL (ref 0.1–1.0)
Neutro Abs: 5.6 10*3/uL (ref 1.7–7.7)
Neutrophils Relative %: 63 %

## 2018-04-13 LAB — TECHNOLOGIST SMEAR REVIEW: Plt Morphology: NORMAL

## 2018-04-13 NOTE — Progress Notes (Signed)
Patient here for initial visit. States she smokes 1-1.5 packs a day and has been smoking since she was 8. Mom, dad and 3 sisters and had cancer and are deceased.

## 2018-04-13 NOTE — Progress Notes (Addendum)
Hematology/Oncology Consult note [facility] Telephone:([phone] [phone]   Patient Care Team: Burnard Hawthorne, FNP as PCP - General (Family Medicine)  REFERRING PROVIDER: Burnard Hawthorne, FNP  CHIEF COMPLAINTS/REASON FOR VISIT:  Evaluation of polycytosis  HISTORY OF PRESENTING ILLNESS:  Tracy Bell is a  67 y.o.  female with PMH listed below who was referred to me for evaluation of polycytosis/erythrocytosis Reviewed patient's recent lab work which was obtain by PCP.  Labs 03/23/2018 showed elevated hemoglobin at 16.4,  total white count 8.2, platelet counts 225.  Normal differential. Chronic Onset, duration since at least 2015.  In 2015, her hemoglobin was 15.6. No aggravating or alleviating factors.   Associated signs or symptoms: Denies weight loss, fever, chills, fatigue, night sweats.   Context:  Smoking history: Current some day smoker, history of 90-pack-year history.  History of blood clots: Denies Daytime somnolence: Denies Family history of polycythemia: Denies Patient tells me that her husband passed away 4 months ago.  She lives by herself.   Review of Systems  Constitutional: Negative for appetite change, chills, fatigue and fever.  HENT:   Negative for hearing loss and voice change.   Eyes: Negative for eye problems.  Respiratory: Negative for chest tightness and cough.   Cardiovascular: Negative for chest pain.  Gastrointestinal: Negative for abdominal distention, abdominal pain and blood in stool.  Endocrine: Negative for hot flashes.  Genitourinary: Negative for difficulty urinating and frequency.   Musculoskeletal: Negative for arthralgias.  Skin: Negative for itching and rash.  Neurological: Negative for extremity weakness.  Hematological: Negative for adenopathy.  Psychiatric/Behavioral: Negative for confusion.    MEDICAL HISTORY:  Past Medical History:  Diagnosis Date  . Personal history of tobacco  use, presenting hazards to health 08/28/2015    SURGICAL HISTORY: Past Surgical History:  Procedure Laterality Date  . ABDOMINAL HYSTERECTOMY  1980  . BREAST BIOPSY     normal    SOCIAL HISTORY: Social History   Socioeconomic History  . Marital status: Widowed    Spouse name: Not on file  . Number of children: Not on file  . Years of education: Not on file  . Highest education level: Not on file  Occupational History  . Not on file  Social Needs  . Financial resource strain: Not on file  . Food insecurity:    Worry: Not on file    Inability: Not on file  . Transportation needs:    Medical: Not on file    Non-medical: Not on file  Tobacco Use  . Smoking status: Current Some Day Smoker  . Smokeless tobacco: Never Used  Substance and Sexual Activity  . Alcohol use: No    Alcohol/week: 0.0 standard drinks  . Drug use: No  . Sexual activity: Not on file  Lifestyle  . Physical activity:    Days per week: Not on file    Minutes per session: Not on file  . Stress: Not on file  Relationships  . Social connections:    Talks on phone: Not on file    Gets together: Not on file    Attends religious service: Not on file    Active member of club or organization: Not on file    Attends meetings of clubs or organizations: Not on file    Relationship status: Not on file  . Intimate partner violence:    Fear of current or ex partner: Not on file    Emotionally abused: Not on file  Physically abused: Not on file    Forced sexual activity: Not on file  Other Topics Concern  . Not on file  Social History Narrative   Lives with husband. Has 11 pets in home. Has one son, lives locally.   Was born and raised in [place].   Husband is older than her and she provides care for him.      Work - retired. Invented water bottle for pets    FAMILY HISTORY: Family History  Problem Relation Age of Onset  . Cancer Mother        Pancreatic Cancer   . Cancer Father        Stomach  Cancer   . Cancer Sister   . Cancer Sister        ovarian    ALLERGIES:  has No Known Allergies.  MEDICATIONS:  Current Outpatient Medications  Medication Sig Dispense Refill  . amLODipine (NORVASC) 5 MG tablet Take 1 tablet (5 mg total) by mouth daily. 90 tablet 3  . aspirin EC 81 MG tablet Take 1 tablet (81 mg total) by mouth daily. 30 tablet 6  . atorvastatin (LIPITOR) 10 MG tablet Take 1 tablet (10 mg total) by mouth daily. 90 tablet 3  . buPROPion (WELLBUTRIN SR) 150 MG 12 hr tablet Take one tablet by mouth daily for 3 days, and then increase to one tablet by mouth twice daily. 60 tablet 2  . Multiple Vitamins-Minerals (CENTRUM ADULTS) TABS Take 1 tablet by mouth daily. 30 tablet 6   No current facility-administered medications for this visit.      PHYSICAL EXAMINATION: ECOG PERFORMANCE STATUS: 0 - Asymptomatic Vitals:   04/13/18 0947  BP: (!) 155/91  Pulse: 72  Resp: 18  Temp: (!) 96.9 F (36.1 C)   Filed Weights   04/13/18 0947  Weight: 150 lb (68 kg)    Physical Exam Constitutional:      General: She is not in acute distress. HENT:     Head: Normocephalic and atraumatic.  Eyes:     General: No scleral icterus.    Pupils: Pupils are equal, round, and reactive to light.  Neck:     Musculoskeletal: Normal range of motion and neck supple.  Cardiovascular:     Rate and Rhythm: Normal rate and regular rhythm.     Heart sounds: Murmur present.  Pulmonary:     Effort: Pulmonary effort is normal. No respiratory distress.     Breath sounds: No wheezing.  Abdominal:     General: Bowel sounds are normal. There is no distension.     Palpations: Abdomen is soft. There is no mass.     Tenderness: There is no abdominal tenderness.  Musculoskeletal: Normal range of motion.        General: No deformity.  Skin:    General: Skin is warm and dry.     Findings: No erythema or rash.  Neurological:     Mental Status: She is alert and oriented to person, place, and time.       Cranial Nerves: No cranial nerve deficit.     Coordination: Coordination normal.  Psychiatric:        Behavior: Behavior normal.        Thought Content: Thought content normal.      LABORATORY DATA:  I have reviewed the data as listed Lab Results  Component Value Date   WBC 8.2 03/23/2018   HGB 16.4 (H) 03/23/2018   HCT 48.4 (H) 03/23/2018   MCV  90.7 03/23/2018   PLT 225.0 03/23/2018   Recent Labs    03/23/18 1010 04/03/18 1038  NA 140 138  K 5.3* 4.8  CL 104 102  CO2 31 30  GLUCOSE 91 93  BUN 17 14  CREATININE 0.91 0.87  CALCIUM 9.7 9.5  PROT 7.1  --   ALBUMIN 4.4  --   AST 18  --   ALT 16  --   ALKPHOS 69  --   BILITOT 0.5  --    Iron/TIBC/Ferritin/ %Sat No results found for: IRON, TIBC, FERRITIN, IRONPCTSAT      ASSESSMENT & PLAN:  1. Erythrocytosis   2. Personal history of tobacco use, presenting hazards to health   3. Murmur     Labs are reviewed and discussed with patient. Polycythemia (erythrocytosis) is an abnormal elevation of hemoglobin (Hgb) and/or hematocrit (Hct) in peripheral blood, and this can be caused by primary etiology, ie bone marrow mutation, or secondary etiology, ie hypoxia, smoking, androgen supplements, etc.  I will obtain erythropoietin, carbo monoxide level, rule out primary etiology, JAK2 with reflex to other mutations   # Smoking cessation was discussed with patient and cessation program information was given to patient.  # Heart murmur, advise patient to discuss with pcp for further evaluation.   Orders Placed This Encounter  Procedures  . CBC with Differential/Platelet    Standing Status:   Future    Number of Occurrences:   1    Standing Expiration Date:   04/14/2019  . Technologist smear review    Standing Status:   Future    Number of Occurrences:   1    Standing Expiration Date:   04/14/2019  . Erythropoietin    Standing Status:   Future    Number of Occurrences:   1    Standing Expiration Date:   04/14/2019   . Carbon monoxide, blood (performed at ref lab)    Standing Status:   Future    Number of Occurrences:   1    Standing Expiration Date:   04/13/2019  . JAK2 V617F, w Reflex to CALR/E12/MPL    Standing Status:   Future    Number of Occurrences:   1    Standing Expiration Date:   04/14/2019    We spent sufficient time to discuss many aspect of care, questions were answered to patient's satisfaction. Offered patient to come back in 2-3 weeks to discuss about lab results. Patient prefers results to be called to her and has no additional follow up. She is overwhelmed with her husband's medical bills already.   Return of visit: no follow up will be scheduled per patient's request.  Thank you for this kind referral and the opportunity to participate in the care of this patient. A copy of today's note is routed to referring provider  Total face to face encounter time for this patient visit was 45 min. >50% of the time was  spent in counseling and coordination of care.   Cc PCP  Earlie Server, MD, PhD Hematology Oncology [facility] Pager- [phone] 04/13/2018

## 2018-04-14 DIAGNOSIS — D751 Secondary polycythemia: Secondary | ICD-10-CM | POA: Insufficient documentation

## 2018-04-14 LAB — ERYTHROPOIETIN: ERYTHROPOIETIN: 11.4 m[IU]/mL (ref 2.6–18.5)

## 2018-04-16 LAB — CARBON MONOXIDE, BLOOD (PERFORMED AT REF LAB): Carbon Monoxide, Blood: 12.4 % — ABNORMAL HIGH (ref 0.0–3.6)

## 2018-04-18 ENCOUNTER — Other Ambulatory Visit: Payer: Self-pay | Admitting: Family

## 2018-04-18 DIAGNOSIS — F17200 Nicotine dependence, unspecified, uncomplicated: Secondary | ICD-10-CM

## 2018-04-19 DIAGNOSIS — I2510 Atherosclerotic heart disease of native coronary artery without angina pectoris: Secondary | ICD-10-CM | POA: Diagnosis not present

## 2018-04-19 DIAGNOSIS — R0602 Shortness of breath: Secondary | ICD-10-CM | POA: Diagnosis not present

## 2018-04-19 DIAGNOSIS — R079 Chest pain, unspecified: Secondary | ICD-10-CM | POA: Diagnosis not present

## 2018-04-20 LAB — CALR + JAK2 E12-15 + MPL (REFLEXED)

## 2018-04-20 LAB — JAK2 V617F, W REFLEX TO CALR/E12/MPL

## 2018-04-27 ENCOUNTER — Ambulatory Visit (INDEPENDENT_AMBULATORY_CARE_PROVIDER_SITE_OTHER): Payer: Medicare Other | Admitting: Family

## 2018-04-27 ENCOUNTER — Encounter: Payer: Self-pay | Admitting: Family

## 2018-04-27 VITALS — BP 142/98 | HR 69 | Temp 98.0°F | Ht 66.0 in | Wt 149.8 lb

## 2018-04-27 DIAGNOSIS — I2510 Atherosclerotic heart disease of native coronary artery without angina pectoris: Secondary | ICD-10-CM

## 2018-04-27 DIAGNOSIS — D751 Secondary polycythemia: Secondary | ICD-10-CM

## 2018-04-27 DIAGNOSIS — F17200 Nicotine dependence, unspecified, uncomplicated: Secondary | ICD-10-CM | POA: Diagnosis not present

## 2018-04-27 DIAGNOSIS — I10 Essential (primary) hypertension: Secondary | ICD-10-CM

## 2018-04-27 DIAGNOSIS — I350 Nonrheumatic aortic (valve) stenosis: Secondary | ICD-10-CM

## 2018-04-27 MED ORDER — LISINOPRIL 5 MG PO TABS: 5.0000 mg | ORAL_TABLET | Freq: Every day | ORAL | 3 refills | Status: DC

## 2018-04-27 NOTE — Patient Instructions (Addendum)
Follow up in 3 months to follow hemoglobin. Please call Dr Bunnie Domino office in regards to CT abdomen and pelvis. His office will need to schedule this.   I would advise you take wellbutrin 150mg  twice per day and continue to smoke less.   Start lisinopril in addition to amlodipine. Monitor blood pressure,  Goal is less than 120/80, based on newest guidelines; if persistently higher, please make sooner follow up appointment so we can recheck you blood pressure and manage medications  Please make non fasting lab appt for one week.

## 2018-04-27 NOTE — Assessment & Plan Note (Addendum)
Patient is evaluated by oncology.  Appears benign erythrocytosis.  JAK appears negative.  Messaged Dr. Tasia Catchings, will await her final review, interpretation.  Discussed with patient that we need to monitor hemoglobin every 3 months.  Advised to continue goal towards smoking cessation.

## 2018-04-27 NOTE — Assessment & Plan Note (Signed)
Audible murmur.  Recent echo.  She is currently following with Dr. Saralyn Pilar and has follow-up scheduled with him next week.  Will follow

## 2018-04-27 NOTE — Progress Notes (Signed)
Subjective:    Patient ID: Tracy Bell, female    DOB: [birth year], 67 y.o.   MRN: [id number]  CC: Tracy Bell is a 67 y.o. female who presents today for follow up.   HPI: Feels well today. Disappointed that she started smoking again   Started wellbutrin 150mg  QD  for smoking cessation one month ago. Felt less desire to smoke and stopped for a few days. Not taking the second dose of wellbutrin. Sleeping well. No depression.  No si/hi.   SOB is the same, unchanged. No CP. 'I have never had chest pain'   Mammogram, carotid US is scheduled.   Erythrocytosis-  continues to smoke.   HTN- compliant with medication. Doesn't check blood pressure at home. No cp.   No depression.    Seen Dr. Saralyn Pilar December 5.CAD, SOB,HTN   Is to return to clinic after Holter monitor, 2D echocardiogram. Echo EF > 55%. Mild AS, AR  Has seen Dr. dew, vascular in regards to thoracic aortic aneurysms.  Discussed carotid ultrasound.  Advised CT abdomen pelvis in 1 year to follow aneurysm. Has seen oncology, Dr. Tasia Catchings.  Negative for Jak 2 mutation.  suspects smoking has caused elevated hemoglobin.   HISTORY:  Past Medical History:  Diagnosis Date  . Hyperlipidemia   . Hypertension   . Personal history of tobacco use, presenting hazards to health 08/28/2015   Past Surgical History:  Procedure Laterality Date  . ABDOMINAL HYSTERECTOMY  1980   complete  . BREAST BIOPSY  1970   normal   Family History  Problem Relation Age of Onset  . Cancer Mother        Pancreatic Cancer   . Cancer Father        Stomach Cancer   . Lung cancer Sister   . Cancer Sister        ovarian  . Lung cancer Sister     Allergies: Patient has no known allergies. Current Outpatient Medications on File Prior to Visit  Medication Sig Dispense Refill  . amLODipine (NORVASC) 5 MG tablet Take 1 tablet (5 mg total) by mouth daily. 90 tablet 3  . aspirin EC 81 MG tablet Take 1 tablet (81 mg total) by mouth daily. 30  tablet 6  . atorvastatin (LIPITOR) 10 MG tablet Take 1 tablet (10 mg total) by mouth daily. 90 tablet 3  . buPROPion (WELLBUTRIN SR) 150 MG 12 hr tablet TAKE ONE TABLET BY MOUTH DAILY FOR 3 DAYS, AND THEN INCREASE TO ONE TABLET BY MOUTH TWICE DAILY. 180 tablet 1  . cholecalciferol (VITAMIN D3) 25 MCG (1000 UT) tablet Take 1,000 Units by mouth daily.    . Multiple Vitamins-Minerals (CENTRUM ADULTS) TABS Take 1 tablet by mouth daily. 30 tablet 6   No current facility-administered medications on file prior to visit.     Social History   Tobacco Use  . Smoking status: Current Some Day Smoker    Packs/day: 1.50    Years: 60.00    Pack years: 90.00  . Smokeless tobacco: Never Used  Substance Use Topics  . Alcohol use: Yes    Alcohol/week: 0.0 standard drinks    Comment: occasionally  . Drug use: No    Review of Systems  Constitutional: Negative for chills and fever.  Respiratory: Negative for cough.   Cardiovascular: Negative for chest pain and palpitations.  Gastrointestinal: Negative for nausea and vomiting.      Objective:    BP (!) 142/98   Pulse 69  Temp 98 F (36.7 C) (Oral)   Ht 5\' 6"  (1.676 m)   Wt 149 lb 12.8 oz (67.9 kg)   SpO2 93%   BMI 24.18 kg/m  BP Readings from Last 3 Encounters:  04/27/18 (!) 142/98  04/13/18 (!) 155/91  04/10/18 122/85   Wt Readings from Last 3 Encounters:  04/27/18 149 lb 12.8 oz (67.9 kg)  04/13/18 150 lb (68 kg)  04/10/18 147 lb 3.2 oz (66.8 kg)    Physical Exam Vitals signs reviewed.  Constitutional:      Appearance: She is well-developed.  Eyes:     Conjunctiva/sclera: Conjunctivae normal.  Cardiovascular:     Rate and Rhythm: Normal rate and regular rhythm.     Pulses: Normal pulses.     Heart sounds: Murmur present. Systolic murmur present with a grade of 2/6.     Comments: SEM II/VI, Loudest LSB, non radiating, no thrill  Pulmonary:     Effort: Pulmonary effort is normal.     Breath sounds: Normal breath sounds.  No wheezing, rhonchi or rales.  Skin:    General: Skin is warm and dry.  Neurological:     Mental Status: She is alert.  Psychiatric:        Speech: Speech normal.        Behavior: Behavior normal.        Thought Content: Thought content normal.        Assessment & Plan:   Problem List Items Addressed This Visit      Cardiovascular and Mediastinum   Aortic stenosis, mild    Audible murmur.  Recent echo.  She is currently following with Dr. Saralyn Pilar and has follow-up scheduled with him next week.  Will follow      Relevant Medications   lisinopril (PRINIVIL,ZESTRIL) 5 MG tablet   Essential hypertension - Primary    Elevated.  Added lisinopril to her regimen.  Repeat BMP in 1 week.  Close follow-up      Relevant Medications   lisinopril (PRINIVIL,ZESTRIL) 5 MG tablet   Other Relevant Orders   Basic metabolic panel     Other   Tobacco use disorder    Some reduction in smoking. Advised to continue to take wellbutrin BID. Close follow up.       Erythrocytosis    Patient is evaluated by oncology.  Appears benign erythrocytosis.  JAK appears negative.  Messaged Dr. Tasia Catchings, will await her final review, interpretation.  Discussed with patient that we need to monitor hemoglobin every 3 months.  Advised to continue goal towards smoking cessation.          I am having Tracy Bell start on lisinopril. I am also having her maintain her aspirin EC, CENTRUM ADULTS, amLODipine, atorvastatin, cholecalciferol, and buPROPion.   Meds ordered this encounter  Medications  . lisinopril (PRINIVIL,ZESTRIL) 5 MG tablet    Sig: Take 1 tablet (5 mg total) by mouth daily.    Dispense:  90 tablet    Refill:  3    Order Specific Question:   Supervising Provider    Answer:   Crecencio Mc [[id number]]    Return precautions given.   Risks, benefits, and alternatives of the medications and treatment plan prescribed today were discussed, and patient expressed understanding.   Education  regarding symptom management and diagnosis given to patient on AVS.  Continue to follow with Burnard Hawthorne, FNP for routine health maintenance.   Jakyla M Eveleth and I agreed with plan.  [name], FNP

## 2018-04-27 NOTE — Assessment & Plan Note (Signed)
Some reduction in smoking. Advised to continue to take wellbutrin BID. Close follow up.

## 2018-04-27 NOTE — Assessment & Plan Note (Addendum)
Elevated.  Added lisinopril to her regimen.  Repeat BMP in 1 week.  Close follow-up

## 2018-05-01 DIAGNOSIS — J410 Simple chronic bronchitis: Secondary | ICD-10-CM | POA: Diagnosis not present

## 2018-05-01 DIAGNOSIS — R002 Palpitations: Secondary | ICD-10-CM | POA: Diagnosis not present

## 2018-05-01 DIAGNOSIS — F17200 Nicotine dependence, unspecified, uncomplicated: Secondary | ICD-10-CM | POA: Diagnosis not present

## 2018-05-01 DIAGNOSIS — R079 Chest pain, unspecified: Secondary | ICD-10-CM | POA: Diagnosis not present

## 2018-05-01 DIAGNOSIS — I2510 Atherosclerotic heart disease of native coronary artery without angina pectoris: Secondary | ICD-10-CM | POA: Diagnosis not present

## 2018-05-01 DIAGNOSIS — I712 Thoracic aortic aneurysm, without rupture: Secondary | ICD-10-CM | POA: Diagnosis not present

## 2018-05-01 DIAGNOSIS — R0602 Shortness of breath: Secondary | ICD-10-CM | POA: Diagnosis not present

## 2018-05-01 DIAGNOSIS — I491 Atrial premature depolarization: Secondary | ICD-10-CM | POA: Diagnosis not present

## 2018-05-01 DIAGNOSIS — I10 Essential (primary) hypertension: Secondary | ICD-10-CM | POA: Diagnosis not present

## 2018-05-01 DIAGNOSIS — E7800 Pure hypercholesterolemia, unspecified: Secondary | ICD-10-CM | POA: Diagnosis not present

## 2018-05-01 DIAGNOSIS — I350 Nonrheumatic aortic (valve) stenosis: Secondary | ICD-10-CM | POA: Diagnosis not present

## 2018-05-04 ENCOUNTER — Other Ambulatory Visit (INDEPENDENT_AMBULATORY_CARE_PROVIDER_SITE_OTHER): Payer: Medicare Other

## 2018-05-04 DIAGNOSIS — I10 Essential (primary) hypertension: Secondary | ICD-10-CM

## 2018-05-04 LAB — BASIC METABOLIC PANEL
BUN: 20 mg/dL (ref 6–23)
CALCIUM: 9.2 mg/dL (ref 8.4–10.5)
CO2: 29 mEq/L (ref 19–32)
Chloride: 102 mEq/L (ref 96–112)
Creatinine, Ser: 0.87 mg/dL (ref 0.40–1.20)
GFR: 68.43 mL/min (ref >60.00)
Glucose, Bld: 84 mg/dL (ref 70–99)
Potassium: 4.3 mEq/L (ref 3.5–5.1)
Sodium: 138 mEq/L (ref 135–145)

## 2018-05-07 ENCOUNTER — Encounter: Payer: Self-pay | Admitting: Family

## 2018-05-07 ENCOUNTER — Ambulatory Visit
Admission: RE | Admit: 2018-05-07 | Discharge: 2018-05-07 | Disposition: A | Discharge status: Home / Self Care | Payer: Medicare Other | Source: Ambulatory Visit | Attending: Family | Admitting: Family

## 2018-05-07 DIAGNOSIS — Z1231 Encounter for screening mammogram for malignant neoplasm of breast: Secondary | ICD-10-CM | POA: Diagnosis not present

## 2018-05-07 DIAGNOSIS — Z1239 Encounter for other screening for malignant neoplasm of breast: Secondary | ICD-10-CM

## 2018-05-10 ENCOUNTER — Ambulatory Visit (INDEPENDENT_AMBULATORY_CARE_PROVIDER_SITE_OTHER): Payer: Medicare Other

## 2018-05-10 ENCOUNTER — Ambulatory Visit (INDEPENDENT_AMBULATORY_CARE_PROVIDER_SITE_OTHER): Payer: Medicare Other | Admitting: Nurse Practitioner

## 2018-05-10 ENCOUNTER — Telehealth (INDEPENDENT_AMBULATORY_CARE_PROVIDER_SITE_OTHER): Payer: Self-pay | Admitting: Nurse Practitioner

## 2018-05-10 ENCOUNTER — Encounter (INDEPENDENT_AMBULATORY_CARE_PROVIDER_SITE_OTHER): Payer: Self-pay | Admitting: Nurse Practitioner

## 2018-05-10 VITALS — BP 122/85 | HR 65 | Resp 12 | Ht 66.0 in | Wt 147.4 lb

## 2018-05-10 DIAGNOSIS — E7800 Pure hypercholesterolemia, unspecified: Secondary | ICD-10-CM

## 2018-05-10 DIAGNOSIS — F17210 Nicotine dependence, cigarettes, uncomplicated: Secondary | ICD-10-CM | POA: Diagnosis not present

## 2018-05-10 DIAGNOSIS — R0989 Other specified symptoms and signs involving the circulatory and respiratory systems: Secondary | ICD-10-CM | POA: Diagnosis not present

## 2018-05-10 DIAGNOSIS — I10 Essential (primary) hypertension: Secondary | ICD-10-CM

## 2018-05-10 DIAGNOSIS — I6523 Occlusion and stenosis of bilateral carotid arteries: Secondary | ICD-10-CM | POA: Insufficient documentation

## 2018-05-10 NOTE — Progress Notes (Signed)
Subjective:    Patient ID: Tracy Bell, female    DOB: 1951/02/24, 68 y.o.   MRN: [id number] Chief Complaint  Patient presents with  . Follow-up    HPI  Tracy Bell is a 68 y.o. female that is following up today with concerns for a pulsating area that she notices whenever she happens to get "spells".  She states that she has a hard time describing what exactly happens but she states that sometimes she gets nauseated, sometimes she feels faint/dizzy, and she feels her pulse get very rapid.  She states that this happens when she begins to get stressed and she is able to feel when these episodes come on and usually trying to relax and deep breathing will help them.  She denies any fever, chills, nausea, vomiting or diarrhea.  She denies any chest pain or shortness of breath.  She denies any TIA-like symptoms or amaurosis fugax.  She denies any actual syncopal episodes.  The patient underwent a bilateral carotid ultrasound today which revealed on her right carotid with 80 to 99% stenosis in the proximal internal carotid artery.  The left carotid has a 40 to 59% stenosis in the proximal internal carotid artery.  Past Medical History:  Diagnosis Date  . Hyperlipidemia   . Hypertension   . Personal history of tobacco use, presenting hazards to health 08/28/2015    Past Surgical History:  Procedure Laterality Date  . ABDOMINAL HYSTERECTOMY  1980   complete  . BREAST BIOPSY  1970   normal    Social History   Socioeconomic History  . Marital status: Widowed    Spouse name: Not on file  . Number of children: Not on file  . Years of education: Not on file  . Highest education level: Not on file  Occupational History  . Occupation: retired  Scientific laboratory technician  . Financial resource strain: Not on file  . Food insecurity:    Worry: Not on file    Inability: Not on file  . Transportation needs:    Medical: Not on file    Non-medical: Not on file  Tobacco Use  . Smoking status:  Current Some Day Smoker    Packs/day: 1.50    Years: 60.00    Pack years: 90.00  . Smokeless tobacco: Never Used  Substance and Sexual Activity  . Alcohol use: Yes    Alcohol/week: 0.0 standard drinks    Comment: occasionally  . Drug use: No  . Sexual activity: Not on file  Lifestyle  . Physical activity:    Days per week: Not on file    Minutes per session: Not on file  . Stress: Not on file  Relationships  . Social connections:    Talks on phone: Not on file    Gets together: Not on file    Attends religious service: Not on file    Active member of club or organization: Not on file    Attends meetings of clubs or organizations: Not on file    Relationship status: Not on file  . Intimate partner violence:    Fear of current or ex partner: Not on file    Emotionally abused: Not on file    Physically abused: Not on file    Forced sexual activity: Not on file  Other Topics Concern  . Not on file  Social History Narrative   Lives with husband. Has 11 pets in home. Has one son, lives locally.   Was born  and raised in [place].   Husband is older than her and she provides care for him.      Work - retired. Invented water bottle for pets    Family History  Problem Relation Age of Onset  . Cancer Mother        Pancreatic Cancer   . Cancer Father        Stomach Cancer   . Lung cancer Sister   . Cancer Sister        ovarian  . Lung cancer Sister     No Known Allergies   Review of Systems   Review of Systems: Negative Unless Checked Constitutional: [] Weight loss  [] Fever  [] Chills Cardiac: [] Chest pain   []  Atrial Fibrillation  [] Palpitations   [] Shortness of breath when laying flat   [] Shortness of breath with exertion. [] Shortness of breath at rest Vascular:  [] Pain in legs with walking   [] Pain in legs with standing [] Pain in legs when laying flat   [] Claudication    [] Pain in feet when laying flat    [] History of DVT   [] Phlebitis   [] Swelling in legs    [] Varicose veins   [] Non-healing ulcers Pulmonary:   [] Uses home oxygen   [] Productive cough   [] Hemoptysis   [] Wheeze  [x] COPD   [] Asthma Neurologic:  [] Dizziness   [] Seizures  [] Blackouts [] History of stroke   [] History of TIA  [] Aphasia   [] Temporary Blindness   [] Weakness or numbness in arm   [] Weakness or numbness in leg Musculoskeletal:   [] Joint swelling   [] Joint pain   [] Low back pain  []  History of Knee Replacement [] Arthritis [] back Surgeries  []  Spinal Stenosis    Hematologic:  [] Easy bruising  [] Easy bleeding   [] Hypercoagulable state   [] Anemic Gastrointestinal:  [] Diarrhea   [] Vomiting  [] Gastroesophageal reflux/heartburn   [] Difficulty swallowing. [] Abdominal pain Genitourinary:  [] Chronic kidney disease   [] Difficult urination  [] Anuric   [] Blood in urine [] Frequent urination  [] Burning with urination   [] Hematuria Skin:  [] Rashes   [] Ulcers [] Wounds Psychological:  [x] History of anxiety   [x]  History of major depression  []  Memory Difficulties     Objective:   Physical Exam  BP 122/85 (BP Location: Right Arm, Patient Position: Sitting)   Pulse 65   Resp 12   Ht 5\' 6"  (1.676 m)   Wt 147 lb 6.4 oz (66.9 kg)   BMI 23.79 kg/m   Gen: WD/WN, NAD Head: [place]/AT, No temporalis wasting.  Ear/Nose/Throat: Hearing grossly intact, nares w/o erythema or drainage Eyes: PER, EOMI, sclera nonicteric.  Neck: Supple, no masses.  No JVD.  Pulmonary:  Good air movement, no use of accessory muscles.  Cardiac: RRR Vascular: left carotid bruit Vessel Right Left  Radial Palpable Palpable   Gastrointestinal: soft, non-distended. No guarding/no peritoneal signs.  Musculoskeletal: M/S 5/5 throughout.  No deformity or atrophy.  Neurologic: Pain and light touch intact in extremities.  Symmetrical.  Speech is fluent. Motor exam as listed above. Psychiatric: Judgment intact, Mood & affect appropriate for pt's clinical situation. Dermatologic: No Venous rashes. No Ulcers Noted.  No changes  consistent with cellulitis. Lymph : No Cervical lymphadenopathy, no lichenification or skin changes of chronic lymphedema.      Assessment & Plan:   1. Carotid stenosis, bilateral The patient underwent a bilateral carotid ultrasound today which revealed on her right carotid with 80 to 99% stenosis in the proximal internal carotid artery.  The left carotid has a 40 to 59% stenosis  in the proximal internal carotid artery.  The patient remains asymptomatic with respect to the carotid stenosis.  However, the patient has now progressed and has a lesion the is >70%.  Patient should undergo CT angiography of the carotid arteries to define the degree of stenosis of the internal carotid arteries bilaterally and the anatomic suitability for surgery vs. intervention.  If the patient does indeed need surgery cardiac clearance will be required, once cleared the patient will be scheduled for surgery.  The risks, benefits and alternative therapies were reviewed in detail with the patient.  All questions were answered.  The patient agrees to proceed with imaging.  Continue antiplatelet therapy as prescribed. Continue management of CAD, HTN and Hyperlipidemia. Healthy heart diet, encouraged exercise at least 4 times per week.   - CT ANGIO NECK W OR WO CONTRAST; Future  2. Essential hypertension Continue antihypertensive medications as already ordered, these medications have been reviewed and there are no changes at this time.   3. Hypercholesteremia Continue statin as ordered and reviewed, no changes at this time    Current Outpatient Medications on File Prior to Visit  Medication Sig Dispense Refill  . amLODipine (NORVASC) 5 MG tablet Take 1 tablet (5 mg total) by mouth daily. 90 tablet 3  . aspirin EC 81 MG tablet Take 1 tablet (81 mg total) by mouth daily. 30 tablet 6  . atorvastatin (LIPITOR) 10 MG tablet Take 1 tablet (10 mg total) by mouth daily. 90 tablet 3  . buPROPion (WELLBUTRIN SR)  150 MG 12 hr tablet TAKE ONE TABLET BY MOUTH DAILY FOR 3 DAYS, AND THEN INCREASE TO ONE TABLET BY MOUTH TWICE DAILY. 180 tablet 1  . cholecalciferol (VITAMIN D3) 25 MCG (1000 UT) tablet Take 1,000 Units by mouth daily.    Marland Kitchen lisinopril (PRINIVIL,ZESTRIL) 5 MG tablet Take 1 tablet (5 mg total) by mouth daily. 90 tablet 3  . Multiple Vitamins-Minerals (CENTRUM ADULTS) TABS Take 1 tablet by mouth daily. 30 tablet 6   No current facility-administered medications on file prior to visit.     There are no Patient Instructions on file for this visit. No follow-ups on file.   Kris Hartmann, NP  This note was completed with Sales executive.  Any errors are purely unintentional.

## 2018-05-10 NOTE — Telephone Encounter (Signed)
Patient stated that she was called earlier today and has her appointment made for 05/17/2018.

## 2018-05-17 ENCOUNTER — Ambulatory Visit
Admission: RE | Admit: 2018-05-17 | Discharge: 2018-05-17 | Disposition: A | Discharge status: Home / Self Care | Payer: Medicare Other | Source: Ambulatory Visit | Attending: Nurse Practitioner | Admitting: Nurse Practitioner

## 2018-05-17 DIAGNOSIS — I6523 Occlusion and stenosis of bilateral carotid arteries: Secondary | ICD-10-CM | POA: Diagnosis not present

## 2018-05-17 MED ORDER — IOPAMIDOL (ISOVUE-370) INJECTION 76%
75.0000 mL | Freq: Once | INTRAVENOUS | Status: AC | PRN
  Administered 2018-05-17: 75 mL via INTRAVENOUS

## 2018-05-17 NOTE — Telephone Encounter (Signed)
LEFT A MESSAGE FOR PATIENT TO CALL TO SCHEDULE A APPOINTMENT

## 2018-06-12 ENCOUNTER — Encounter (INDEPENDENT_AMBULATORY_CARE_PROVIDER_SITE_OTHER): Payer: Self-pay | Admitting: Vascular Surgery

## 2018-06-12 ENCOUNTER — Ambulatory Visit (INDEPENDENT_AMBULATORY_CARE_PROVIDER_SITE_OTHER): Payer: Medicare Other | Admitting: Vascular Surgery

## 2018-06-12 VITALS — BP 112/76 | HR 69 | Resp 14 | Ht 66.0 in | Wt 149.0 lb

## 2018-06-12 DIAGNOSIS — F17210 Nicotine dependence, cigarettes, uncomplicated: Secondary | ICD-10-CM

## 2018-06-12 DIAGNOSIS — I712 Thoracic aortic aneurysm, without rupture, unspecified: Secondary | ICD-10-CM

## 2018-06-12 DIAGNOSIS — I6523 Occlusion and stenosis of bilateral carotid arteries: Secondary | ICD-10-CM | POA: Diagnosis not present

## 2018-06-12 DIAGNOSIS — E7800 Pure hypercholesterolemia, unspecified: Secondary | ICD-10-CM

## 2018-06-12 DIAGNOSIS — F17200 Nicotine dependence, unspecified, uncomplicated: Secondary | ICD-10-CM | POA: Diagnosis not present

## 2018-06-12 DIAGNOSIS — I10 Essential (primary) hypertension: Secondary | ICD-10-CM | POA: Diagnosis not present

## 2018-06-12 DIAGNOSIS — Z87891 Personal history of nicotine dependence: Secondary | ICD-10-CM | POA: Diagnosis not present

## 2018-06-12 NOTE — Assessment & Plan Note (Signed)
blood pressure control important in reducing the progression of atherosclerotic disease. On appropriate oral medications.  

## 2018-06-12 NOTE — Assessment & Plan Note (Signed)
4.3 cm.  Following annually.

## 2018-06-12 NOTE — Assessment & Plan Note (Signed)
We had a discussion for approximately 3 minutes regarding the absolute need for smoking cessation due to the deleterious nature of tobacco on the vascular system. We discussed the tobacco use would diminish patency of any intervention, and likely significantly worsen progressio of disease. We discussed multiple agents for quitting including replacement therapy or medications to reduce cravings such as Chantix. The patient voices their understanding of the importance of smoking cessation.  We discussed specifically in the perioperative period the increased risk of pneumonias and respiratory complications.  We also discussed the risk of recurrence being higher with tobacco use.

## 2018-06-12 NOTE — Assessment & Plan Note (Signed)
lipid control important in reducing the progression of atherosclerotic disease. Continue statin therapy  

## 2018-06-12 NOTE — Progress Notes (Signed)
MRN : [id number]  Tracy Bell is a 68 y.o. (1951-04-02) female who presents with chief complaint of  Chief Complaint  Patient presents with  . Follow-up  .  History of Present Illness: Patient returns today in follow up of her carotid disease.  She has undergone a CT angiogram which I have independently reviewed.  The official report is of a 75% right ICA stenosis and a 50% left ICA stenosis.  As is usual, that is a gross underestimate on the right which is clearly greater than 75%.  The left side appears to be 50% or less to me.  She is doing well today without any focal neurologic symptoms.  She has had several presyncopal episodes.  She does continue to smoke and asks what risk this poses her going forward.  Current Outpatient Medications  Medication Sig Dispense Refill  . amLODipine (NORVASC) 5 MG tablet Take 1 tablet (5 mg total) by mouth daily. 90 tablet 3  . aspirin EC 81 MG tablet Take 1 tablet (81 mg total) by mouth daily. 30 tablet 6  . atorvastatin (LIPITOR) 10 MG tablet Take 1 tablet (10 mg total) by mouth daily. 90 tablet 3  . cholecalciferol (VITAMIN D3) 25 MCG (1000 UT) tablet Take 1,000 Units by mouth daily.    Marland Kitchen lisinopril (PRINIVIL,ZESTRIL) 5 MG tablet Take 1 tablet (5 mg total) by mouth daily. 90 tablet 3  . Multiple Vitamins-Minerals (CENTRUM ADULTS) TABS Take 1 tablet by mouth daily. 30 tablet 6   No current facility-administered medications for this visit.     Past Medical History:  Diagnosis Date  . Hyperlipidemia   . Hypertension   . Personal history of tobacco use, presenting hazards to health 08/28/2015    Past Surgical History:  Procedure Laterality Date  . ABDOMINAL HYSTERECTOMY  1980   complete  . BREAST BIOPSY  1970   normal    Social History Social History   Tobacco Use  . Smoking status: Current Some Day Smoker    Packs/day: 1.50    Years: 60.00    Pack years: 90.00  . Smokeless tobacco: Never Used  Substance Use Topics  . Alcohol  use: Yes    Alcohol/week: 0.0 standard drinks    Comment: occasionally  . Drug use: No     Family History Family History  Problem Relation Age of Onset  . Cancer Mother        Pancreatic Cancer   . Cancer Father        Stomach Cancer   . Lung cancer Sister   . Cancer Sister        ovarian  . Lung cancer Sister      No Known Allergies  REVIEW OF SYSTEMS (Negative unless checked)  Constitutional: [] ?Weight loss  [] ?Fever  [] ?Chills Cardiac: [] ?Chest pain   [] ?Chest pressure   [] ?Palpitations   [] ?Shortness of breath when laying flat   [] ?Shortness of breath at rest   [x] ?Shortness of breath with exertion. Vascular:  [] ?Pain in legs with walking   [] ?Pain in legs at rest   [] ?Pain in legs when laying flat   [] ?Claudication   [] ?Pain in feet when walking  [] ?Pain in feet at rest  [] ?Pain in feet when laying flat   [] ?History of DVT   [] ?Phlebitis   [] ?Swelling in legs   [] ?Varicose veins   [] ?Non-healing ulcers Pulmonary:   [] ?Uses home oxygen   [] ?Productive cough   [] ?Hemoptysis   [] ?Wheeze  [] ?COPD   [] ?  Asthma Neurologic:  [] ?Dizziness  [] ?Blackouts   [] ?Seizures   [] ?History of stroke   [] ?History of TIA  [] ?Aphasia   [] ?Temporary blindness   [] ?Dysphagia   [] ?Weakness or numbness in arms   [] ?Weakness or numbness in legs Musculoskeletal:  [x] ?Arthritis   [] ?Joint swelling   [] ?Joint pain   [] ?Low back pain Hematologic:  [] ?Easy bruising  [] ?Easy bleeding   [] ?Hypercoagulable state   [] ?Anemic  [] ?Hepatitis Gastrointestinal:  [] ?Blood in stool   [] ?Vomiting blood  [] ?Gastroesophageal reflux/heartburn   [] ?Abdominal pain Genitourinary:  [] ?Chronic kidney disease   [] ?Difficult urination  [] ?Frequent urination  [] ?Burning with urination   [] ?Hematuria Skin:  [] ?Rashes   [] ?Ulcers   [] ?Wounds Psychological:  [] ?History of anxiety   [] ? History of major depression.   Physical Examination  BP 112/76 (BP Location: Right Arm, Patient Position: Sitting)   Pulse 69   Resp 14   Ht  5\' 6"  (1.676 m)   Wt 149 lb (67.6 kg)   BMI 24.05 kg/m  Gen:  WD/WN, NAD Head: [place]/AT, No temporalis wasting. Ear/Nose/Throat: Hearing grossly intact, nares w/o erythema or drainage Eyes: Conjunctiva clear. Sclera non-icteric Neck: Supple.  Trachea midline.  Right carotid bruit Pulmonary:  Good air movement, no use of accessory muscles.  Cardiac: RRR, no JVD Vascular:  Vessel Right Left  Radial Palpable Palpable                          PT Palpable Palpable  DP Palpable Palpable   Gastrointestinal: soft, non-tender/non-distended.  Musculoskeletal: M/S 5/5 throughout.  No deformity or atrophy. No edema. Neurologic: Sensation grossly intact in extremities.  Symmetrical.  Speech is fluent.  Psychiatric: Judgment intact, Mood & affect appropriate for pt's clinical situation. Dermatologic: No rashes or ulcers noted.  No cellulitis or open wounds.       Labs Recent Results (from the past 2160 hour(s))  TSH     Status: None   Collection Time: 03/23/18 10:10 AM  Result Value Ref Range   TSH 3.05 0.35 - 4.50 uIU/mL  CBC with Differential/Platelet     Status: Abnormal   Collection Time: 03/23/18 10:10 AM  Result Value Ref Range   WBC 8.2 4.0 - 10.5 K/uL   RBC 5.33 (H) 3.87 - 5.11 Mil/uL   Hemoglobin 16.4 (H) 12.0 - 15.0 g/dL   HCT 48.4 (H) 36.0 - 46.0 %   MCV 90.7 78.0 - 100.0 fl   MCHC 33.9 30.0 - 36.0 g/dL   RDW 14.0 11.5 - 15.5 %   Platelets 225.0 150.0 - 400.0 K/uL   Neutrophils Relative % 56.0 43.0 - 77.0 %   Lymphocytes Relative 33.5 12.0 - 46.0 %   Monocytes Relative 7.5 3.0 - 12.0 %   Eosinophils Relative 2.2 0.0 - 5.0 %   Basophils Relative 0.8 0.0 - 3.0 %   Neutro Abs 4.6 1.4 - 7.7 K/uL   Lymphs Abs 2.7 0.7 - 4.0 K/uL   Monocytes Absolute 0.6 0.1 - 1.0 K/uL   Eosinophils Absolute 0.2 0.0 - 0.7 K/uL   Basophils Absolute 0.1 0.0 - 0.1 K/uL  Comprehensive metabolic panel     Status: Abnormal   Collection Time: 03/23/18 10:10 AM  Result Value Ref Range    Sodium 140 135 - 145 mEq/L   Potassium 5.3 (H) 3.5 - 5.1 mEq/L   Chloride 104 96 - 112 mEq/L   CO2 31 19 - 32 mEq/L   Glucose, Bld 91 70 - 99  mg/dL   BUN 17 6 - 23 mg/dL   Creatinine, Ser 0.91 0.40 - 1.20 mg/dL   Total Bilirubin 0.5 0.2 - 1.2 mg/dL   Alkaline Phosphatase 69 39 - 117 U/L   AST 18 0 - 37 U/L   ALT 16 0 - 35 U/L   Total Protein 7.1 6.0 - 8.3 g/dL   Albumin 4.4 3.5 - 5.2 g/dL   Calcium 9.7 8.4 - 10.5 mg/dL   GFR 64.99 >60.00 mL/min  Hemoglobin A1c     Status: None   Collection Time: 03/23/18 10:10 AM  Result Value Ref Range   Hgb A1c MFr Bld 5.8 4.6 - 6.5 %    Comment: Glycemic Control Guidelines for People with Diabetes:Non Diabetic:  <6%Goal of Therapy: <7%Additional Action Suggested:  >8%   Lipid panel     Status: Abnormal   Collection Time: 03/23/18 10:10 AM  Result Value Ref Range   Cholesterol 200 0 - 200 mg/dL    Comment: ATP III Classification       Desirable:  < 200 mg/dL               Borderline High:  200 - 239 mg/dL          High:  > = 240 mg/dL   Triglycerides 84.0 0.0 - 149.0 mg/dL    Comment: Normal:  <150 mg/dLBorderline High:  150 - 199 mg/dL   HDL 56.60 >39.00 mg/dL   VLDL 16.8 0.0 - 40.0 mg/dL   LDL Cholesterol 127 (H) 0 - 99 mg/dL   Total CHOL/HDL Ratio 4     Comment:                Men          Women1/2 Average Risk     3.4          3.3Average Risk          5.0          4.42X Average Risk          9.6          7.13X Average Risk          15.0          11.0                       NonHDL 143.55     Comment: NOTE:  Non-HDL goal should be 30 mg/dL higher than patient's LDL goal (i.e. LDL goal of < 70 mg/dL, would have non-HDL goal of < 100 mg/dL)  VITAMIN D 25 Hydroxy (Vit-D Deficiency, Fractures)     Status: Abnormal   Collection Time: 03/23/18 10:10 AM  Result Value Ref Range   VITD 16.32 (L) 30.00 - 100.00 ng/mL  Basic metabolic panel     Status: None   Collection Time: 04/03/18 10:38 AM  Result Value Ref Range   Sodium 138 135 - 145 mEq/L    Potassium 4.8 3.5 - 5.1 mEq/L   Chloride 102 96 - 112 mEq/L   CO2 30 19 - 32 mEq/L   Glucose, Bld 93 70 - 99 mg/dL   BUN 14 6 - 23 mg/dL   Creatinine, Ser 0.87 0.40 - 1.20 mg/dL   Calcium 9.5 8.4 - 10.5 mg/dL   GFR 68.45 >60.00 mL/min  CBC with Differential/Platelet     Status: Abnormal   Collection Time: 04/13/18 10:17 AM  Result Value Ref Range   WBC 8.8 4.0 -  10.5 K/uL   RBC 5.53 (H) 3.87 - 5.11 MIL/uL   Hemoglobin 16.3 (H) 12.0 - 15.0 g/dL   HCT 49.8 (H) 36.0 - 46.0 %   MCV 90.1 80.0 - 100.0 fL   MCH 29.5 26.0 - 34.0 pg   MCHC 32.7 30.0 - 36.0 g/dL   RDW 13.4 11.5 - 15.5 %   Platelets 217 150 - 400 K/uL   nRBC 0.0 0.0 - 0.2 %    Comment: Performed at [facility], [place], [place] [address]   Neutrophils Relative % ORDER MODIFIED OR RESCHEDULED %   Neutro Abs ORDER MODIFIED OR RESCHEDULED 1.7 - 7.7 K/uL   Band Neutrophils ORDER MODIFIED OR RESCHEDULED %   Lymphocytes Relative ORDER MODIFIED OR RESCHEDULED %   Lymphs Abs ORDER MODIFIED OR RESCHEDULED 0.7 - 4.0 K/uL   Monocytes Relative ORDER MODIFIED OR RESCHEDULED %   Monocytes Absolute ORDER MODIFIED OR RESCHEDULED 0.1 - 1.0 K/uL   Eosinophils Relative ORDER MODIFIED OR RESCHEDULED %   Eosinophils Absolute ORDER MODIFIED OR RESCHEDULED 0.0 - 0.5 K/uL   Basophils Relative ORDER MODIFIED OR RESCHEDULED %   Basophils Absolute ORDER MODIFIED OR RESCHEDULED 0.0 - 0.1 K/uL   WBC Morphology ORDER MODIFIED OR RESCHEDULED    RBC Morphology ORDER MODIFIED OR RESCHEDULED    Smear Review ORDER MODIFIED OR RESCHEDULED    Other ORDER MODIFIED OR RESCHEDULED %   nRBC ORDER MODIFIED OR RESCHEDULED 0 /100 WBC   Metamyelocytes Relative ORDER MODIFIED OR RESCHEDULED %   Myelocytes ORDER MODIFIED OR RESCHEDULED %   Promyelocytes Relative ORDER MODIFIED OR RESCHEDULED %   Blasts ORDER MODIFIED OR RESCHEDULED %  Technologist smear review     Status: None   Collection Time: 04/13/18 10:17 AM  Result Value Ref Range    Tech Review Normal platelet morphology     Comment: PLATELETS APPEAR ADEQUATE RBC MORPHOLOGY NORMAL WBC MORPHOLOGY UNREMARKABLE MORPHOLOGY UNREMARKABLE Performed at [facility], [address] [place], [place] [address]   Erythropoietin     Status: None   Collection Time: 04/13/18 10:17 AM  Result Value Ref Range   Erythropoietin 11.4 2.6 - 18.5 mIU/mL    Comment: (NOTE) Beckman Coulter UniCel DxI 800 Immunoassay System Values obtained with different assay methods or kits cannot be used interchangeably. Results cannot be interpreted as absolute evidence of the presence or absence of malignant disease. Performed At: [facility] [place], [place] [id number] Rush Farmer MD [id number]   Carbon monoxide, blood (performed at ref lab)     Status: Abnormal   Collection Time: 04/13/18 10:17 AM  Result Value Ref Range   Carbon Monoxide, Blood 12.4 (H) 0.0 - 3.6 %    Comment: (NOTE)                            Environmental Exposure:                             Nonsmokers           <3.7                             Smokers              <9.9  Occupational Exposure:                             BEI                   3.5                                Detection Limit =  0.2 **Verified by repeat analysis** Performed At: [facility] [place], [place] [id number] Rush Farmer MD [id number]   JAK2 V617F, w Reflex to CALR/E12/MPL     Status: None   Collection Time: 04/13/18 10:23 AM  Result Value Ref Range   JAK2 GenotypR Comment     Comment: (NOTE) Result: NEGATIVE for the JAK2 V617F mutation. Interpretation:  The G to T nucleotide change encoding the V617F mutation was not detected.  This result does not rule out the presence of the JAK2 mutation at a level below the sensitivity of detection of this assay, or the presence of other mutations within JAK2 not detected by this assay.  This result does not  rule out a diagnosis of polycythemia vera, essential thrombocythemia or idiopathic myelofibrosis as the V617F mutation is not detected in all patients with these disorders.    BACKGROUND: Comment     Comment: (NOTE) JAK2 is a cytoplasmic tyrosine kinase with a key role in signal transduction from multiple hematopoietic growth factor receptors. A point mutation within exon 14 of the JAK2 gene ([id number]) encoding a valine to phenylalanine substitution at position 617 of the JAK2 protein (V617F) has been identified in most patients with polycythemia vera, and in about half of those with either essential thrombocythemia or idiopathic myelofibrosis. The V617F has also been detected, although infrequently, in other myeloid disorders such as chronic myelomonocytic leukemia and chronic neutrophilic luekemia. V617F is an acquired mutation that alters a highly conserved valine present in the negative regulatory JH2 domain of the JAK2 protein and is predicted to dysregulate kinase activity. Methodology: Total genomic DNA was extracted and subjected to TaqMan real-time PCR amplification/detection. Two amplification products per sample were monitored by real-time PCR using primers/probes s pecific to JAK2 wild type (WT) and JAK2 mutant V617F. The ABI7900 Absolute Quantitation software will compare the patient specimen valuse to the standard curves and generate percent values for wild type and mutant type. In vitro studies have indicated that this assay has an analytical sensitivity of 1%. References: Baxter EJ, Scott Phineas Real, et al. Acquired mutation of the tyrosine kinase JAK2 in human myeloproliferative disorders. Lancet. 2005 Mar 19-25; 365(9464):1054-1061. Alfonso Ramus Couedic JP. A unique clonal JAK2 mutation leading to constitutive signaling causes polycythaemia vera. Nature. 2005 Apr 28; 434(7037):1144-1148. Kralovics R, Passamonti F, Buser AS, et al. A  gain-of-function mutation of JAK2 in myeloproliferative disorders. N Engl J Med. 2005 Apr 28; 352(17):1779-1790.    Director Review, JAK2 Comment     Comment: (NOTE) Constance Goltz, PhD, [facility]               Director, [place] for [place], [place]  [phone] This test was developed and its performance characteristics determined by [facility]. It has not been cleared or approved by the Food and Drug Administration.    REFLEX: Comment     Comment: (NOTE) Reflex to CALR Mutation Analysis, JAK2 Exon 12-15 Mutation Analysis, and MPL Mutation Analysis is indicated.    Extraction Completed     Comment: (NOTE) Performed At: [facility] [address] [place], [place] [id number] Nechama Guard MD [id number]:[id number] Performed At: [facility] [address] [place], [place] [id number] Nechama Guard MD [id number]   CALR + JAK2 E12-15 + MPL (reflexed)     Status: None   Collection Time: 04/13/18 10:23 AM  Result Value Ref Range   CALR Mutation Detection Result Comment     Comment: (NOTE) NEGATIVE No insertions or deletions were detected within the analyzed region of the calreticulin (CALR) gene. A negative result does not entirely exclude the possibility of a clonal population carrying CALR gene mutations that are not covered by this assay. Results should be interpreted in conjunction with clinical and laboratory findings for the most accurate interpretation.    Background: Comment     Comment: (NOTE) The calcium-binding endoplasmic reticulin chaperone protein, calreticulin (CALR), is somatically mutated in approximately 70% of patients with JAK2-negative essential thrombocythemia (ET) and 60- 88% of patients with JAK2-negative primary myelofibrosis(PMF). Only a minority of patients (approximately 8%) with myelodysplasia have mutations in  CALR  gene. CALR mutations are rarely detected in patients with de novo acute myeloid leukemia, chronic myelogenous leukemia, lymphoid leukemia, or solid tumors. CALR mutations are not detected in polycythemia and generally appear to be mutually exclusive with JAK2 mutations and MPL mutations. The majority of mutational changes involve a variety of insertion or deletion mutations in exon 9 of the calreticulin gene: approximately 53% of all CALR mutations are a 52 bp deletion (type-1) while the second most prevalent mutation (approximately 32%) contains a 5 bp insertion (type-2). Other mutations (non-type 1 or type 2) are seen  in a small minority of cases. CALR mutations in PMF tend to be associated with a favorable prognosis compared to JAK2 V617F mutations, whereas primary myelofibrosis negative for CALR, JAK2 V617F and MPL mutations (so-called triple negative) is associated with a poor prognosis and shorter survival. The detection of a CALR gene mutation aids in the specific diagnosis of a myeloproliferative neoplasm, and help distinguish this clonal disease from a benign reactive process.    Methodology: Comment     Comment: (NOTE) Genomic DNA was isolated from the provided specimen. Polymerase chain reaction (PCR) of exon 9 of the CALR gene was performed with specific fluorescent-labeled primers, and the PCR product was analyzed by capillary gel electrophoresis to determine the size of the PCR products. This PCR assay is capable of detecting a mutant cell population with a sensitivity of 5 mutant cells per 100 normal cells. A negative result does not exclude the presence of a myeloproliferative disorder or other neoplastic process. This test was developed and its performance characteristics determined by [facility]. It has not been cleared or approved by the Food and Drug Administration. The FDA has determined that such clearance or approval is not necessary.    References: Comment      Comment: (NOTE) 1. Klampfel, T. et al. (2013) Somatic mutations of calreticulin in   myeloproliferative neoplasms. New Engl. J. Med. 229:7989-2119. 2. Haynes Kerns et al. (2013) Somatic CALR mutations in   myeloproliferative neoplasms with nonmutated JAK2. New Engl. J.  Med. [phone].    Director Review Comment     Comment: (NOTE) Katina Degree, MD, PhD Director, [place] for [place], [place] [address] [phone]    JAK2 Exons 12-15 Mut Det PCR: Comment     Comment: (NOTE) NEGATIVE JAK2 mutations were not detected in exons 12, 13, 14 and 15. This result does not rule out the presence of JAK2 mutation at a level below the detection sensitivity of this assay, the presence of other mutations outside the analyzed region of the JAK2 gene, or the presence of a myeloproliferative or other neoplasm. Result must be correlated with other clinical data for the most accurate diagnosis.    BACKGROUND: Comment     Comment: (NOTE) JAK2 V617F mutation is detected in patients with polycythemia vera (95%), essential thrombocythemia (50%) and primary myelofibrosis (50%). A small percentage of JAK2 mutation positive patients (3.3%) contain other non-V617F mutations within exons 12 to 15. The detection of a JAK2 gene mutation aids in the specific diagnosis of a myeloproliferative neoplasm, and help distinguish this clonal disease from a benign reactive process.    Method Comment     Comment: (NOTE) Total RNA was purified from the provided specimen. The JAK2 gene region covering exons 12 to 15 was subjected to reverse- transcription coupled PCR amplification, and bi-directional sequencing to identify sequence variations. This assay has a sensitivity to detect approximately 15% population of cells containing the JAK2 mutations in a background of non-mutant cells. This test was developed and its performance  characteristics determined by [facility]. It has not been cleared or approved by the Food and Drug Administration.    References Comment     Comment: (NOTE) Algasham, N. et al. Detection of mutations in JAK2 exons 12-15 by Sanger sequencing. Int J Lab Hemato. 2015, 38:34-41. Joelene Millin al. Mutation profile of JAK2 transcripts in patients with chronic myeloproliferative neoplasias. J Mol Diagn. 2009, 11:49-53.    DIRECTOR REVIEW: Comment     Comment: (NOTE) Loni Muse, PhD, [facility]    Director, [place] for [place], [place] [address]    [phone]    MPL MUTATION ANALYSIS RESULT: Comment     Comment: (NOTE) No MPL mutation was identified in the provided specimen of this individual. Results should be interpreted in conjunction with clinical and other laboratory findings for the most accurate interpretation.    BACKGROUND: Comment     Comment: (NOTE) MPL (myeloproliferative leukemia virus oncogene homology) belongs to the hematopoietin superfamily and enables its ligand thrombopoietin to facilitate both global hematopoiesis and megakaryocyte growth and differentiation. MPL W515 mutations are present in patients with primary myelofibrosis (PMF) and essential thrombocythemia (ET) at a frequency of approximately 5% and 1% respectively. The S505 mutation is detected in patients with hereditary thrombocythemia.    METHODOLOGY: Comment     Comment: (NOTE) Genomic DNA was purified from the provided specimen. MPL gene region covering the S505N and W515L/K mutations were subjected to PCR amplification and bi-directional sequencing in duplicate to identify sequence variations. This assay has a sensitivity to detect approximately 20-25% population of cells containing the MPL mutations in a background of non-mutant cells. This assay will not detect the mutation below the sensitivity of this assay. Molecular- based testing  is highly accurate, but as in any laboratory test, rare diagnostic errors may occur.    REFERENCES: Comment     Comment: (NOTE) 1. Pardanani  AD, et al. (2006). MPL515 mutations in   myeloproliferative and other myeloid disorders: a study   of 1182 patients. Blood [id number]. 2. Andre Lefort and Levine RL. (2008). JAK2 and MPL   mutations in myeloproliferative neoplasms: discovery and   science. Leukemia 22:1813-1817. 3. Juline Patch, et al. (2009). Evidence for a founder effect   of the MPL-S505N mutation in eight [place] pedigrees with   hereditary thrombocythemia. Haematologica 94(10):1368-   3267.    DIRECTOR REVIEW: Comment     Comment: (NOTE) Loni Muse, PhD, [facility]    Director, [place] for [place], [place] [address]    [phone] This test was developed and its performance characteristics determined by [facility]. It has not been cleared or approved by the Food and Drug Administration.    Extraction Comment     Comment: (NOTE) This sample has been received and DNA extraction has been performed. Performed At: [facility] [address] [place], [place] [id number] Nechama Guard MD [id number] Performed At: [place], [place] [id number] Nechama Guard MD [id number]   Differential     Status: None   Collection Time: 04/13/18 10:40 AM  Result Value Ref Range   Neutrophils Relative % 63 %   Neutro Abs 5.6 1.7 - 7.7 K/uL   Lymphocytes Relative 26 %   Lymphs Abs 2.3 0.7 - 4.0 K/uL   Monocytes Relative 7 %   Monocytes Absolute 0.6 0.1 - 1.0 K/uL   Eosinophils Relative 3 %   Eosinophils Absolute 0.2 0.0 - 0.5 K/uL   Basophils Relative 1 %   Basophils Absolute 0.1 0.0 - 0.1 K/uL   Immature Granulocytes 0 %   Abs Immature Granulocytes 0.02 0.00 - 0.07 K/uL    Comment: Performed at [facility], [address] [place], [place] [address]   Basic metabolic panel     Status: None   Collection Time: 05/04/18  9:53 AM  Result Value Ref Range   Sodium 138 135 - 145 mEq/L   Potassium 4.3 3.5 - 5.1 mEq/L   Chloride 102 96 - 112 mEq/L   CO2 29 19 - 32 mEq/L   Glucose, Bld 84 70 - 99 mg/dL   BUN 20 6 - 23 mg/dL   Creatinine, Ser 0.87 0.40 - 1.20 mg/dL   Calcium 9.2 8.4 - 10.5 mg/dL   GFR 68.43 >60.00 mL/min    Radiology Ct Angio Neck W Or Wo Contrast  Result Date: 05/17/2018 CLINICAL DATA:  Right greater than left carotid artery stenosis on ultrasound. Intermittent near syncopal episodes. Smoker. EXAM: CT ANGIOGRAPHY NECK TECHNIQUE: Multidetector CT imaging of the neck was performed using the standard protocol during bolus administration of intravenous contrast. Multiplanar CT image reconstructions and MIPs were obtained to evaluate the vascular anatomy. Carotid stenosis measurements (when applicable) are obtained utilizing NASCET criteria, using the distal internal carotid diameter as the denominator. CONTRAST:  11mL ISOVUE-370 IOPAMIDOL (ISOVUE-370) INJECTION 76% COMPARISON:  Chest CT 03/21/2018 FINDINGS: Aortic arch: Standard 3 vessel aortic arch with moderate atherosclerotic plaque. 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 Nonstenotic plaque in the brachiocephalic and subclavian arteries. Right carotid system: Patent with extensive, predominantly calcified plaque at the carotid bifurcation resulting in 75% proximal ICA stenosis. Left carotid system: Patent with less and 50% stenosis of the common carotid artery origin. Moderate  volume, predominantly calcified plaque in the carotid bulb. A thin web like filling defect in the ICA 1.5 cm distal to its origin results in 50% stenosis. Vertebral arteries: Patent with the left being mildly dominant. Mild left V1 segment atherosclerosis without stenosis. Skeleton: No acute osseous abnormality or suspicious osseous  lesion. Other neck: No evidence of acute abnormality or mass. Upper chest: Mild centrilobular emphysema. IMPRESSION: 1. Extensive proximal right ICA atherosclerotic plaque resulting in 75% stenosis. 2. 50% proximal left ICA stenosis due to a small web. 3. Widely patent vertebral arteries. 4. Previously reported 4.3 cm ascending aortic aneurysm. Aortic Atherosclerosis (ICD10-I70.0). Emphysema (ICD10-J43.9). Aortic aneurysm NOS (ICD10-I71.9). Electronically Signed   By: Logan Bores M.D.   On: 05/17/2018 11:03    Assessment/Plan  Essential hypertension blood pressure control important in reducing the progression of atherosclerotic disease. On appropriate oral medications.   Thoracic aortic aneurysm without rupture (HCC) 4.3 cm.  Following annually.  Hypercholesteremia lipid control important in reducing the progression of atherosclerotic disease. Continue statin therapy   Tobacco use disorder We had a discussion for approximately 3 minutes regarding the absolute need for smoking cessation due to the deleterious nature of tobacco on the vascular system. We discussed the tobacco use would diminish patency of any intervention, and likely significantly worsen progressio of disease. We discussed multiple agents for quitting including replacement therapy or medications to reduce cravings such as Chantix. The patient voices their understanding of the importance of smoking cessation.  We discussed specifically in the perioperative period the increased risk of pneumonias and respiratory complications.  We also discussed the risk of recurrence being higher with tobacco use.   Carotid stenosis, bilateral She has undergone a CT angiogram which I have independently reviewed.  The official report is of a 75% right ICA stenosis and a 50% left ICA stenosis.  As is usual, that is a gross underestimate on the right which is clearly greater than 75%.  The left side appears to be 50% or less to  me.  Recommend:  The patient remains asymptomatic with respect to the carotid stenosis.  However, the patient has now progressed and has a lesion the is >75%.  Patient's CT angiography of the carotid arteries confirms >75% right ICA stenosis.  The anatomical considerations support surgery over stenting.  This was discussed in detail with the patient.  The patient does indeed need surgery, therefore, cardiac clearance will be arranged. Once cleared the patient will be scheduled for surgery.  The risks, benefits and alternative therapies were reviewed in detail with the patient.  All questions were answered.  The patient agrees to proceed with surgery of the right carotid artery.  Continue antiplatelet therapy as prescribed. Continue management of CAD, HTN and Hyperlipidemia. Healthy heart diet, encouraged exercise at least 4 times per week.      Leotis Pain, MD  06/12/2018 10:17 AM    This note was created with Dragon medical transcription system.  Any errors from dictation are purely unintentional

## 2018-06-12 NOTE — Assessment & Plan Note (Signed)
She has undergone a CT angiogram which I have independently reviewed.  The official report is of a 75% right ICA stenosis and a 50% left ICA stenosis.  As is usual, that is a gross underestimate on the right which is clearly greater than 75%.  The left side appears to be 50% or less to me.  Recommend:  The patient remains asymptomatic with respect to the carotid stenosis.  However, the patient has now progressed and has a lesion the is >75%.  Patient's CT angiography of the carotid arteries confirms >75% right ICA stenosis.  The anatomical considerations support surgery over stenting.  This was discussed in detail with the patient.  The patient does indeed need surgery, therefore, cardiac clearance will be arranged. Once cleared the patient will be scheduled for surgery.  The risks, benefits and alternative therapies were reviewed in detail with the patient.  All questions were answered.  The patient agrees to proceed with surgery of the right carotid artery.  Continue antiplatelet therapy as prescribed. Continue management of CAD, HTN and Hyperlipidemia. Healthy heart diet, encouraged exercise at least 4 times per week.

## 2018-06-12 NOTE — Patient Instructions (Signed)
Carotid Endarterectomy  A carotid endarterectomy is a surgery to remove a blockage in the carotid arteries. The carotid arteries are the large blood vessels on both sides of the neck that supply blood to the brain. Carotid artery disease, also called carotid artery stenosis, is the narrowing or blockage of one or both carotid arteries. Carotid artery disease is usually caused by atherosclerosis, which is a buildup of fat and plaques in the arteries. Some buildup of plaques normally occurs with aging. The plaques may partially or totally block blood flow or cause a clot to form in the carotid arteries. This may cause a stroke. Tell a health care provider about:  Any allergies you have.  All medicines you are taking, including vitamins, herbs, eye drops, creams, and over-the-counter medicines.  Any problems you or family members have had with anesthetic medicines.  Any blood disorders you have.  Any surgeries you have had.  Any medical conditions you have, including diabetes, kidney problems, and infections.  Whether you are pregnant or may be pregnant. What are the risks? Generally, this is a safe procedure. However, problems may occur, including:  Infection.  Bleeding.  Blood clots.  Allergic reactions to medicines.  Damage to nerves near the carotid arteries. This can cause a hoarse voice or weakness of muscles your face.  Stroke.  Seizures.  Heart attack (myocardial infarction).  Narrowing of the opened blood vessel (re-stenosis) . This may require another surgery. What happens before the procedure? Medicines  Ask your health care provider about: ? Changing or stopping your regular medicines. This is especially important if you are taking diabetes medicines or blood thinners. ? Taking medicines such as aspirin and ibuprofen. These medicines can thin your blood. Do not take these medicines before your procedure if your health care provider instructs you not to.  You may  be given antibiotic medicine to help prevent infection. Staying hydrated Follow instructions from your health care provider about hydration, which may include:  Up to 2 hours before the procedure - you may continue to drink clear liquids, such as water, clear fruit juice, black coffee, and plain tea. Eating and drinking restrictions Follow instructions from your health care provider about eating and drinking, which may include:  8 hours before the procedure - stop eating heavy meals or foods such as meat, fried foods, or fatty foods.  6 hours before the procedure - stop eating light meals or foods, such as toast or cereal.  6 hours before the procedure - stop drinking milk or drinks that contain milk.  2 hours before the procedure - stop drinking clear liquids. General instructions  Do not use any products that contain nicotine or tobacco, such as cigarettes and e-cigarettes. These can delay healing after surgery. If you need help quitting, ask your health care provider.  You may need to have blood tests, a test to check heart rhythm (electrocardiogram), or a test to check blood flow (angiogram).  Plan to have someone take you home from the hospital or clinic.  Ask your health care provider how your surgical site will be marked or identified.  You may be asked to shower with a germ-killing soap. What happens during the procedure?  To lower your risk of infection: ? Your health care team will wash or sanitize their hands. ? Your skin will be washed with soap. ? Hair may be removed from the surgical area.  An IV tube will be inserted into one of your veins.  You will be   given one or more of the following: ? A medicine to help you relax (sedative). ? A medicine to make you fall asleep (general anesthetic).  The surgeon will make a small incision in your neck to expose the carotid artery.  A tube may be inserted into the carotid artery above and below the blockage. This tube will  temporarily allow blood to flow around the blockage during the surgery.  An incision will be made in the carotid artery at the location of the blockage, then the blockage will be removed. In some cases, a section of the carotid artery may be removed and a graft patch may be used to repair the artery.  The carotid artery will be closed with stitches (sutures).  If a tube was inserted into the artery to allow blood flow around the blockage during surgery, the tube will be removed. With the tube removed, blood flow to the brain will be restored through the carotid artery.  The incision in the neck will be closed with sutures.  A bandage (dressing) will be placed over your incision. The procedure may vary among health care providers and hospitals. What happens after the procedure?  Your blood pressure, heart rate, breathing rate, and blood oxygen level will be monitored until the medicines you were given have worn off.  You may have some pain or an ache in your neck for a couple weeks. This is normal.  Do not drive for 24 hours if you were given a sedative. Summary  A carotid endarterectomy is a surgery to remove a blockage in the carotid arteries.  The carotid arteries are the large blood vessels on both sides of the neck that supply blood to the brain.  Before the procedure, ask your health care provider about changing or stopping your regular medicines.  Follow instructions from your health care provider about eating and drinking before the procedure.  After the procedure, do not drive for 24 hours if you were given a sedative. This information is not intended to replace advice given to you by your health care provider. Make sure you discuss any questions you have with your health care provider. Document Released: 12/19/2012 Document Revised: 03/07/2016 Document Reviewed: 03/07/2016 Elsevier Interactive Patient Education  2019 [employer]  

## 2018-06-20 ENCOUNTER — Telehealth (INDEPENDENT_AMBULATORY_CARE_PROVIDER_SITE_OTHER): Payer: Self-pay | Admitting: Vascular Surgery

## 2018-06-27 ENCOUNTER — Encounter (INDEPENDENT_AMBULATORY_CARE_PROVIDER_SITE_OTHER): Payer: Self-pay

## 2018-06-27 ENCOUNTER — Telehealth (INDEPENDENT_AMBULATORY_CARE_PROVIDER_SITE_OTHER): Payer: Self-pay

## 2018-06-27 ENCOUNTER — Other Ambulatory Visit (INDEPENDENT_AMBULATORY_CARE_PROVIDER_SITE_OTHER): Payer: Self-pay | Admitting: Nurse Practitioner

## 2018-06-27 NOTE — Telephone Encounter (Signed)
Spoke with the patient and gave her instructions regarding her surgery with Dr. Lucky Cowboy on 07/12/2018. Patient's pre-op is scheduled for 07/03/2018 with an arrival time of 9:45 am. This information will be mailed out to the patient.

## 2018-07-03 ENCOUNTER — Other Ambulatory Visit: Payer: Self-pay

## 2018-07-03 ENCOUNTER — Ambulatory Visit (INDEPENDENT_AMBULATORY_CARE_PROVIDER_SITE_OTHER): Payer: Medicare Other | Admitting: Vascular Surgery

## 2018-07-03 ENCOUNTER — Encounter
Admission: RE | Admit: 2018-07-03 | Discharge: 2018-07-03 | Disposition: A | Discharge status: Home / Self Care | Payer: Medicare Other | Source: Ambulatory Visit | Attending: Vascular Surgery | Admitting: Vascular Surgery

## 2018-07-03 ENCOUNTER — Encounter (INDEPENDENT_AMBULATORY_CARE_PROVIDER_SITE_OTHER): Payer: Self-pay | Admitting: Vascular Surgery

## 2018-07-03 VITALS — BP 139/89 | HR 72 | Resp 16 | Wt 149.0 lb

## 2018-07-03 DIAGNOSIS — I712 Thoracic aortic aneurysm, without rupture, unspecified: Secondary | ICD-10-CM

## 2018-07-03 DIAGNOSIS — I6523 Occlusion and stenosis of bilateral carotid arteries: Secondary | ICD-10-CM | POA: Diagnosis not present

## 2018-07-03 DIAGNOSIS — E7800 Pure hypercholesterolemia, unspecified: Secondary | ICD-10-CM

## 2018-07-03 DIAGNOSIS — I6529 Occlusion and stenosis of unspecified carotid artery: Secondary | ICD-10-CM | POA: Insufficient documentation

## 2018-07-03 DIAGNOSIS — Z7902 Long term (current) use of antithrombotics/antiplatelets: Secondary | ICD-10-CM

## 2018-07-03 DIAGNOSIS — Z79899 Other long term (current) drug therapy: Secondary | ICD-10-CM | POA: Diagnosis not present

## 2018-07-03 DIAGNOSIS — F17210 Nicotine dependence, cigarettes, uncomplicated: Secondary | ICD-10-CM

## 2018-07-03 DIAGNOSIS — I10 Essential (primary) hypertension: Secondary | ICD-10-CM | POA: Diagnosis not present

## 2018-07-03 DIAGNOSIS — Z01818 Encounter for other preprocedural examination: Secondary | ICD-10-CM | POA: Insufficient documentation

## 2018-07-03 HISTORY — DX: Chronic obstructive pulmonary disease, unspecified: J44.9

## 2018-07-03 HISTORY — DX: Dizziness and giddiness: R42

## 2018-07-03 HISTORY — DX: Depression, unspecified: F32.A

## 2018-07-03 HISTORY — DX: Dyspnea, unspecified: R06.00

## 2018-07-03 HISTORY — DX: Cough, unspecified: R05.9

## 2018-07-03 HISTORY — DX: Cough: R05

## 2018-07-03 HISTORY — DX: Anxiety disorder, unspecified: F41.9

## 2018-07-03 HISTORY — DX: Cardiac murmur, unspecified: R01.1

## 2018-07-03 HISTORY — DX: Major depressive disorder, single episode, unspecified: F32.9

## 2018-07-03 LAB — CBC WITH DIFFERENTIAL/PLATELET
Abs Immature Granulocytes: 0.02 10*3/uL (ref 0.00–0.07)
Basophils Absolute: 0.1 10*3/uL (ref 0.0–0.1)
Basophils Relative: 1 %
Eosinophils Absolute: 0.2 10*3/uL (ref 0.0–0.5)
Eosinophils Relative: 2 %
HCT: 47.1 % — ABNORMAL HIGH (ref 36.0–46.0)
HEMOGLOBIN: 15.6 g/dL — AB (ref 12.0–15.0)
Immature Granulocytes: 0 %
LYMPHS PCT: 31 %
Lymphs Abs: 2.8 10*3/uL (ref 0.7–4.0)
MCH: 30.2 pg (ref 26.0–34.0)
MCHC: 33.1 g/dL (ref 30.0–36.0)
MCV: 91.3 fL (ref 80.0–100.0)
Monocytes Absolute: 0.6 10*3/uL (ref 0.1–1.0)
Monocytes Relative: 7 %
Neutro Abs: 5.2 10*3/uL (ref 1.7–7.7)
Neutrophils Relative %: 59 %
Platelets: 222 10*3/uL (ref 150–400)
RBC: 5.16 MIL/uL — AB (ref 3.87–5.11)
RDW: 13.2 % (ref 11.5–15.5)
WBC: 8.8 10*3/uL (ref 4.0–10.5)
nRBC: 0 % (ref 0.0–0.2)

## 2018-07-03 LAB — BASIC METABOLIC PANEL
Anion gap: 6 (ref 5–15)
BUN: 18 mg/dL (ref 8–23)
CHLORIDE: 105 mmol/L (ref 98–111)
CO2: 28 mmol/L (ref 22–32)
Calcium: 9 mg/dL (ref 8.9–10.3)
Creatinine, Ser: 0.76 mg/dL (ref 0.44–1.00)
GFR calc Af Amer: >60 mL/min (ref >60)
GFR calc non Af Amer: >60 mL/min (ref >60)
Glucose, Bld: 98 mg/dL (ref 70–99)
Potassium: 4.4 mmol/L (ref 3.5–5.1)
Sodium: 139 mmol/L (ref 135–145)

## 2018-07-03 LAB — TYPE AND SCREEN
ABO/RH(D): A POS
Antibody Screen: NEGATIVE

## 2018-07-03 LAB — PROTIME-INR
INR: 1 (ref 0.8–1.2)
PROTHROMBIN TIME: 13.2 s (ref 11.4–15.2)

## 2018-07-03 LAB — APTT: aPTT: 31 seconds (ref 24–36)

## 2018-07-03 NOTE — Patient Instructions (Signed)
Your procedure is scheduled on: 07/12/2018 Thurs Report to Same Day Surgery 2nd floor medical mall [facility] Entrance-take elevator on left to 2nd floor.  Check in with surgery information desk.) To find out your arrival time please call [phone] between 1PM - 3PM on 07/11/2018 Wed  Remember: Instructions that are not followed completely may result in serious medical risk, up to and including death, or upon the discretion of your surgeon and anesthesiologist your surgery may need to be rescheduled.    _x___ 1. Do not eat food after midnight the night before your procedure. You may drink clear liquids up to 2 hours before you are scheduled to arrive at the hospital for your procedure.  Do not drink clear liquids within 2 hours of your scheduled arrival to the hospital.  Clear liquids include  --Water or Apple juice without pulp  --Clear carbohydrate beverage such as ClearFast or Gatorade  --Black Coffee or Clear Tea (No milk, no creamers, do not add anything to                  the coffee or Tea Type 1 and type 2 diabetics should only drink water.   ____Ensure clear carbohydrate drink on the way to the hospital for bariatric patients  ____Ensure clear carbohydrate drink 3 hours before surgery for Dr Dwyane Luo patients if physician instructed.   No gum chewing or hard candies.     __x__ 2. No Alcohol for 24 hours before or after surgery.   __x__3. No Smoking or e-cigarettes for 24 prior to surgery.  Do not use any chewable tobacco products for at least 6 hour prior to surgery   ____  4. Bring all medications with you on the day of surgery if instructed.    __x__ 5. Notify your doctor if there is any change in your medical condition     (cold, fever, infections).    x___6. On the morning of surgery brush your teeth with toothpaste and water.  You may rinse your mouth with mouth wash if you wish.  Do not swallow any toothpaste or mouthwash.   Do not wear jewelry, make-up,  hairpins, clips or nail polish.  Do not wear lotions, powders, or perfumes. You may wear deodorant.  Do not shave 48 hours prior to surgery. Men may shave face and neck.  Do not bring valuables to the hospital.    [facility] is not responsible for any belongings or valuables.               Contacts, dentures or bridgework may not be worn into surgery.  Leave your suitcase in the car. After surgery it may be brought to your room.  For patients admitted to the hospital, discharge time is determined by your                       treatment team.  _  Patients discharged the day of surgery will not be allowed to drive home.  You will need someone to drive you home and stay with you the night of your procedure.    Please read over the following fact sheets that you were given:   [facility] Preparing for Surgery and or MRSA Information   _x___ Take anti-hypertensive listed below, cardiac, seizure, asthma,     anti-reflux and psychiatric medicines. These include:  1. amLODipine (NORVASC) 5 MG tablet  2.atorvastatin (LIPITOR) 10 MG tablet  3.  4.  5.  6.  ____Fleets enema or Magnesium Citrate as directed.   _x___ Use CHG Soap or sage wipes as directed on instruction sheet   ____ Use inhalers on the day of surgery and bring to hospital day of surgery  ____ Stop Metformin and Janumet 2 days prior to surgery.    ____ Take 1/2 of usual insulin dose the night before surgery and none on the morning     surgery.   _x___ Follow recommendations from Cardiologist, Pulmonologist or PCP regarding          stopping Aspirin, Coumadin, Plavix ,Eliquis, Effient, or Pradaxa, and Pletal.  X____Stop Anti-inflammatories such as Advil, Aleve, Ibuprofen, Motrin, Naproxen, Naprosyn, Goodies powders or aspirin products. OK to take Tylenol and                          Celebrex.   _x___ Stop supplements until after surgery.  But may continue Vitamin D, Vitamin B,       and multivitamin.   ____ Bring C-Pap to  the hospital.

## 2018-07-03 NOTE — Progress Notes (Signed)
MRN : [id number]  Tracy Bell is a 68 y.o. (1951-03-16) female who presents with chief complaint of  Chief Complaint  Patient presents with  . Follow-up    discuss surgery  .  History of Present Illness: Patient returns today in follow up to discuss her upcoming CEA.  She is very anxious and apprehensive about the surgery.  She has a significant list of questions that we went over today.  She has had no new focal neurologic symptoms.  She has high-grade right carotid artery stenosis and mild left carotid artery stenosis by her CT scan about a month ago.  She is on the schedule for right carotid endarterectomy next week.  She is trying to quit smoking but has yet been unable to.  Current Outpatient Medications  Medication Sig Dispense Refill  . amLODipine (NORVASC) 5 MG tablet Take 1 tablet (5 mg total) by mouth daily. 90 tablet 3  . aspirin EC 81 MG tablet Take 1 tablet (81 mg total) by mouth daily. 30 tablet 6  . atorvastatin (LIPITOR) 10 MG tablet Take 1 tablet (10 mg total) by mouth daily. 90 tablet 3  . cholecalciferol (VITAMIN D3) 25 MCG (1000 UT) tablet Take 1,000 Units by mouth daily.    Marland Kitchen lisinopril (PRINIVIL,ZESTRIL) 5 MG tablet Take 1 tablet (5 mg total) by mouth daily. 90 tablet 3  . Multiple Vitamins-Minerals (CENTRUM ADULTS) TABS Take 1 tablet by mouth daily. 30 tablet 6   No current facility-administered medications for this visit.         Past Medical History:  Diagnosis Date  . Hyperlipidemia   . Hypertension   . Personal history of tobacco use, presenting hazards to health 08/28/2015         Past Surgical History:  Procedure Laterality Date  . ABDOMINAL HYSTERECTOMY  1980   complete  . BREAST BIOPSY  1970   normal    Social History Social History        Tobacco Use  . Smoking status: Current Some Day Smoker    Packs/day: 1.50    Years: 60.00    Pack years: 90.00  . Smokeless tobacco: Never Used  Substance Use Topics  .  Alcohol use: Yes    Alcohol/week: 0.0 standard drinks    Comment: occasionally  . Drug use: No     Family History      Family History  Problem Relation Age of Onset  . Cancer Mother        Pancreatic Cancer   . Cancer Father        Stomach Cancer   . Lung cancer Sister   . Cancer Sister        ovarian  . Lung cancer Sister      No Known Allergies  REVIEW OF SYSTEMS(Negative unless checked)  Constitutional: [] ??Weight loss[] ??Fever[] ??Chills Cardiac:[] ??Chest pain[] ??Chest pressure[] ??Palpitations [] ??Shortness of breath when laying flat [] ??Shortness of breath at rest [x] ??Shortness of breath with exertion. Vascular: [] ??Pain in legs with walking[] ??Pain in legsat rest[] ??Pain in legs when laying flat [] ??Claudication [] ??Pain in feet when walking [] ??Pain in feet at rest [] ??Pain in feet when laying flat [] ??History of DVT [] ??Phlebitis [] ??Swelling in legs [] ??Varicose veins [] ??Non-healing ulcers Pulmonary: [] ??Uses home oxygen [] ??Productive cough[] ??Hemoptysis [] ??Wheeze [] ??COPD [] ??Asthma Neurologic: [] ??Dizziness [] ??Blackouts [] ??Seizures [] ??History of stroke [] ??History of TIA[] ??Aphasia [] ??Temporary blindness[] ??Dysphagia [] ??Weaknessor numbness in arms [] ??Weakness or numbnessin legs Musculoskeletal: [x] ??Arthritis [] ??Joint swelling [] ??Joint pain [] ??Low back pain Hematologic:[] ??Easy bruising[] ??Easy bleeding [] ??Hypercoagulable state [] ??Anemic [] ??Hepatitis Gastrointestinal:[] ??Blood in stool[] ??Vomiting blood[] ??Gastroesophageal reflux/heartburn[] ??Abdominal  pain Genitourinary: [] ??Chronic kidney disease [] ??Difficulturination [] ??Frequenturination [] ??Burning with urination[] ??Hematuria Skin: [] ??Rashes [] ??Ulcers [] ??Wounds Psychological: [x] ??History of anxiety[] ??History of major depression.    Physical  Examination  BP 139/89 (BP Location: Right Arm)   Pulse 72   Resp 16   Wt 149 lb (67.6 kg)   BMI 24.05 kg/m  Gen:  WD/WN, NAD Head: [place]/AT, No temporalis wasting. Ear/Nose/Throat: Hearing grossly intact, nares w/o erythema or drainage Eyes: Conjunctiva clear. Sclera non-icteric Neck: Supple.  Trachea midline Pulmonary:  Good air movement, no use of accessory muscles.  Cardiac: RRR, no JVD Vascular:  Vessel Right Left  Radial Palpable Palpable                           Musculoskeletal: M/S 5/5 throughout.  No deformity or atrophy.  No edema. Neurologic: Sensation grossly intact in extremities.  Symmetrical.  Speech is fluent.  Psychiatric: Judgment intact, a little anxious today Dermatologic: No rashes or ulcers noted.  No cellulitis or open wounds.       Labs Recent Results (from the past 2160 hour(s))  CBC with Differential/Platelet     Status: Abnormal   Collection Time: 04/13/18 10:17 AM  Result Value Ref Range   WBC 8.8 4.0 - 10.5 K/uL   RBC 5.53 (H) 3.87 - 5.11 MIL/uL   Hemoglobin 16.3 (H) 12.0 - 15.0 g/dL   HCT 49.8 (H) 36.0 - 46.0 %   MCV 90.1 80.0 - 100.0 fL   MCH 29.5 26.0 - 34.0 pg   MCHC 32.7 30.0 - 36.0 g/dL   RDW 13.4 11.5 - 15.5 %   Platelets 217 150 - 400 K/uL   nRBC 0.0 0.0 - 0.2 %    Comment: Performed at [facility], [place], [place] [address]   Neutrophils Relative % ORDER MODIFIED OR RESCHEDULED %   Neutro Abs ORDER MODIFIED OR RESCHEDULED 1.7 - 7.7 K/uL   Band Neutrophils ORDER MODIFIED OR RESCHEDULED %   Lymphocytes Relative ORDER MODIFIED OR RESCHEDULED %   Lymphs Abs ORDER MODIFIED OR RESCHEDULED 0.7 - 4.0 K/uL   Monocytes Relative ORDER MODIFIED OR RESCHEDULED %   Monocytes Absolute ORDER MODIFIED OR RESCHEDULED 0.1 - 1.0 K/uL   Eosinophils Relative ORDER MODIFIED OR RESCHEDULED %   Eosinophils Absolute ORDER MODIFIED OR RESCHEDULED 0.0 - 0.5 K/uL   Basophils Relative ORDER MODIFIED OR RESCHEDULED %    Basophils Absolute ORDER MODIFIED OR RESCHEDULED 0.0 - 0.1 K/uL   WBC Morphology ORDER MODIFIED OR RESCHEDULED    RBC Morphology ORDER MODIFIED OR RESCHEDULED    Smear Review ORDER MODIFIED OR RESCHEDULED    Other ORDER MODIFIED OR RESCHEDULED %   nRBC ORDER MODIFIED OR RESCHEDULED 0 /100 WBC   Metamyelocytes Relative ORDER MODIFIED OR RESCHEDULED %   Myelocytes ORDER MODIFIED OR RESCHEDULED %   Promyelocytes Relative ORDER MODIFIED OR RESCHEDULED %   Blasts ORDER MODIFIED OR RESCHEDULED %  Technologist smear review     Status: None   Collection Time: 04/13/18 10:17 AM  Result Value Ref Range   Tech Review Normal platelet morphology     Comment: PLATELETS APPEAR ADEQUATE RBC MORPHOLOGY NORMAL WBC MORPHOLOGY UNREMARKABLE MORPHOLOGY UNREMARKABLE Performed at [facility], [address] [place], [place] [address]   Erythropoietin     Status: None   Collection Time: 04/13/18 10:17 AM  Result Value Ref Range   Erythropoietin 11.4 2.6 - 18.5 mIU/mL    Comment: (NOTE) Beckman Coulter  UniCel DxI 800 Immunoassay System Values obtained with different assay methods or kits cannot be used interchangeably. Results cannot be interpreted as absolute evidence of the presence or absence of malignant disease. Performed At: [facility] [address] [place], [place] [id number] Rush Farmer MD [id number]   Carbon monoxide, blood (performed at ref lab)     Status: Abnormal   Collection Time: 04/13/18 10:17 AM  Result Value Ref Range   Carbon Monoxide, Blood 12.4 (H) 0.0 - 3.6 %    Comment: (NOTE)                            Environmental Exposure:                             Nonsmokers           <3.7                             Smokers              <9.9                            Occupational Exposure:                             BEI                   3.5                                Detection Limit =  0.2 **Verified by repeat analysis** Performed At: [facility] [place], [place] [id number] Rush Farmer MD [id number]   JAK2 V617F, w Reflex to CALR/E12/MPL     Status: None   Collection Time: 04/13/18 10:23 AM  Result Value Ref Range   JAK2 GenotypR Comment     Comment: (NOTE) Result: NEGATIVE for the JAK2 V617F mutation. Interpretation:  The G to T nucleotide change encoding the V617F mutation was not detected.  This result does not rule out the presence of the JAK2 mutation at a level below the sensitivity of detection of this assay, or the presence of other mutations within JAK2 not detected by this assay.  This result does not rule out a diagnosis of polycythemia vera, essential thrombocythemia or idiopathic myelofibrosis as the V617F mutation is not detected in all patients with these disorders.    BACKGROUND: Comment     Comment: (NOTE) JAK2 is a cytoplasmic tyrosine kinase with a key role in signal transduction from multiple hematopoietic growth factor receptors. A point mutation within exon 14 of the JAK2 gene ([id number]) encoding a valine to phenylalanine substitution at position 617 of the JAK2 protein (V617F) has been identified in most patients with polycythemia vera, and in about half of those with either essential thrombocythemia or idiopathic myelofibrosis. The V617F has also been detected, although infrequently, in other myeloid disorders such as chronic myelomonocytic leukemia and chronic neutrophilic luekemia. V617F is an acquired mutation that alters a highly conserved valine present in the negative regulatory JH2 domain of the JAK2 protein and is predicted to dysregulate kinase activity. Methodology: Total genomic DNA was extracted and subjected to TaqMan real-time PCR  amplification/detection. Two amplification products per sample were monitored by real-time PCR using primers/probes s pecific to JAK2 wild type (WT) and JAK2 mutant V617F. The ABI7900 Absolute Quantitation software will compare  the patient specimen valuse to the standard curves and generate percent values for wild type and mutant type. In vitro studies have indicated that this assay has an analytical sensitivity of 1%. References: Baxter EJ, Scott Phineas Real, et al. Acquired mutation of the tyrosine kinase JAK2 in human myeloproliferative disorders. Lancet. 2005 Mar 19-25; 365(9464):1054-1061. Alfonso Ramus Couedic JP. A unique clonal JAK2 mutation leading to constitutive signaling causes polycythaemia vera. Nature. 2005 Apr 28; 434(7037):1144-1148. Kralovics R, Passamonti F, Buser AS, et al. A gain-of-function mutation of JAK2 in myeloproliferative disorders. N Engl J Med. 2005 Apr 28; 352(17):1779-1790.    Director Review, JAK2 Comment     Comment: (NOTE) Constance Goltz, PhD, [facility]               Director, [place] for [place] and [place], [place]               [phone] This test was developed and its performance characteristics determined by [facility]. It has not been cleared or approved by the Food and Drug Administration.    REFLEX: Comment     Comment: (NOTE) Reflex to CALR Mutation Analysis, JAK2 Exon 12-15 Mutation Analysis, and MPL Mutation Analysis is indicated.    Extraction Completed     Comment: (NOTE) Performed At: [facility] [address] [place], [place] [id number] Nechama Guard MD [id number] Performed At: [facility] [address] [place], [place] [id number] Nechama Guard MD [id number]   CALR + JAK2 E12-15 + MPL (reflexed)     Status: None   Collection Time: 04/13/18 10:23 AM  Result Value Ref Range   CALR Mutation Detection Result Comment     Comment: (NOTE) NEGATIVE No insertions or deletions were detected within the analyzed region of the calreticulin (CALR) gene. A negative result does not entirely exclude the possibility of a clonal  population carrying CALR gene mutations that are not covered by this assay. Results should be interpreted in conjunction with clinical and laboratory findings for the most accurate interpretation.    Background: Comment     Comment: (NOTE) The calcium-binding endoplasmic reticulin chaperone protein, calreticulin (CALR), is somatically mutated in approximately 70% of patients with JAK2-negative essential thrombocythemia (ET) and 60- 88% of patients with JAK2-negative primary myelofibrosis(PMF). Only a minority of patients (approximately 8%) with myelodysplasia have mutations in  CALR gene. CALR mutations are rarely detected in patients with de novo acute myeloid leukemia, chronic myelogenous leukemia, lymphoid leukemia, or solid tumors. CALR mutations are not detected in polycythemia and generally appear to be mutually exclusive with JAK2 mutations and MPL mutations. The majority of mutational changes involve a variety of insertion or deletion mutations in exon 9 of the calreticulin gene: approximately 53% of all CALR mutations are a 52 bp deletion (type-1) while the second most prevalent mutation (approximately 32%) contains a 5 bp insertion (type-2). Other mutations (non-type 1 or type 2) are seen  in a small minority of cases. CALR mutations in PMF tend to be associated with  a favorable prognosis compared to JAK2 V617F mutations, whereas primary myelofibrosis negative for CALR, JAK2 V617F and MPL mutations (so-called triple negative) is associated with a poor prognosis and shorter survival. The detection of a CALR gene mutation aids in the specific diagnosis of a myeloproliferative neoplasm, and help distinguish this clonal disease from a benign reactive process.    Methodology: Comment     Comment: (NOTE) Genomic DNA was isolated from the provided specimen. Polymerase chain reaction (PCR) of exon 9 of the CALR gene was performed with specific fluorescent-labeled primers, and  the PCR product was analyzed by capillary gel electrophoresis to determine the size of the PCR products. This PCR assay is capable of detecting a mutant cell population with a sensitivity of 5 mutant cells per 100 normal cells. A negative result does not exclude the presence of a myeloproliferative disorder or other neoplastic process. This test was developed and its performance characteristics determined by [facility]. It has not been cleared or approved by the Food and Drug Administration. The FDA has determined that such clearance or approval is not necessary.    References: Comment     Comment: (NOTE) 1. Klampfel, T. et al. (2013) Somatic mutations of calreticulin in   myeloproliferative neoplasms. New Engl. J. Med. 161:0960-4540. 2. Haynes Kerns et al. (2013) Somatic CALR mutations in   myeloproliferative neoplasms with nonmutated JAK2. New Engl. J.   Med. [phone].    Director Review Comment     Comment: (NOTE) Katina Degree, MD, PhD Director, [place] for [place], [place] [address] [phone]    JAK2 Exons 12-15 Mut Det PCR: Comment     Comment: (NOTE) NEGATIVE JAK2 mutations were not detected in exons 12, 13, 14 and 15. This result does not rule out the presence of JAK2 mutation at a level below the detection sensitivity of this assay, the presence of other mutations outside the analyzed region of the JAK2 gene, or the presence of a myeloproliferative or other neoplasm. Result must be correlated with other clinical data for the most accurate diagnosis.    BACKGROUND: Comment     Comment: (NOTE) JAK2 V617F mutation is detected in patients with polycythemia vera (95%), essential thrombocythemia (50%) and primary myelofibrosis (50%). A small percentage of JAK2 mutation positive patients (3.3%) contain other non-V617F mutations within exons 12 to 15. The detection of a JAK2 gene mutation aids in the  specific diagnosis of a myeloproliferative neoplasm, and help distinguish this clonal disease from a benign reactive process.    Method Comment     Comment: (NOTE) Total RNA was purified from the provided specimen. The JAK2 gene region covering exons 12 to 15 was subjected to reverse- transcription coupled PCR amplification, and bi-directional sequencing to identify sequence variations. This assay has a sensitivity to detect approximately 15% population of cells containing the JAK2 mutations in a background of non-mutant cells. This test was developed and its performance characteristics determined by [facility]. It has not been cleared or approved by the Food and Drug Administration.    References Comment     Comment: (NOTE) Algasham, N. et al. Detection of mutations in JAK2 exons 12-15 by Sanger sequencing. Int J Lab Hemato. 2015, 38:34-41. Joelene Millin al. Mutation profile of JAK2 transcripts in patients with chronic myeloproliferative neoplasias. J Mol Diagn. 2009, 11:49-53.    DIRECTOR REVIEW: Comment     Comment: (NOTE) Loni Muse, PhD, [facility]    Director, Molecular Oncology    [facility]  [facility] for [place], [place] [address]    [phone]    MPL MUTATION ANALYSIS RESULT: Comment     Comment: (NOTE) No MPL mutation was identified in the provided specimen of this individual. Results should be interpreted in conjunction with clinical and other laboratory findings for the most accurate interpretation.    BACKGROUND: Comment     Comment: (NOTE) MPL (myeloproliferative leukemia virus oncogene homology) belongs to the hematopoietin superfamily and enables its ligand thrombopoietin to facilitate both global hematopoiesis and megakaryocyte growth and differentiation. MPL W515 mutations are present in patients with primary myelofibrosis (PMF) and essential thrombocythemia (ET) at a frequency of approximately 5% and 1% respectively. The  S505 mutation is detected in patients with hereditary thrombocythemia.    METHODOLOGY: Comment     Comment: (NOTE) Genomic DNA was purified from the provided specimen. MPL gene region covering the S505N and W515L/K mutations were subjected to PCR amplification and bi-directional sequencing in duplicate to identify sequence variations. This assay has a sensitivity to detect approximately 20-25% population of cells containing the MPL mutations in a background of non-mutant cells. This assay will not detect the mutation below the sensitivity of this assay. Molecular- based testing is highly accurate, but as in any laboratory test, rare diagnostic errors may occur.    REFERENCES: Comment     Comment: (NOTE) 1. Pardanani AD, et al. (2006). MPL515 mutations in   myeloproliferative and other myeloid disorders: a study   of 1182 patients. Blood 950:9326-7124. 2. Andre Lefort and Levine RL. (2008). JAK2 and MPL   mutations in myeloproliferative neoplasms: discovery and   science. Leukemia 22:1813-1817. 3. Juline Patch, et al. (2009). Evidence for a founder effect   of the MPL-S505N mutation in eight [place] pedigrees with   hereditary thrombocythemia. Haematologica 94(10):1368-   5809.    DIRECTOR REVIEW: Comment     Comment: (NOTE) Loni Muse, PhD, [facility]    Director, [place] for [place] and [place], [place] [address]    [phone] This test was developed and its performance characteristics determined by [facility]. It has not been cleared or approved by the Food and Drug Administration.    Extraction Comment     Comment: (NOTE) This sample has been received and DNA extraction has been performed. Performed At: [facility] [address] [place], [place] [id number] Nechama Guard MD [id number] Performed At: [place], [place] [id number] Nechama Guard MD [id number]    Differential     Status: None   Collection Time: 04/13/18 10:40 AM  Result Value Ref Range   Neutrophils Relative % 63 %   Neutro Abs 5.6 1.7 - 7.7 K/uL   Lymphocytes Relative 26 %   Lymphs Abs 2.3 0.7 - 4.0 K/uL   Monocytes Relative 7 %   Monocytes Absolute 0.6 0.1 - 1.0 K/uL   Eosinophils Relative 3 %   Eosinophils Absolute 0.2 0.0 - 0.5 K/uL   Basophils Relative 1 %   Basophils Absolute 0.1 0.0 - 0.1 K/uL   Immature Granulocytes 0 %   Abs Immature Granulocytes 0.02 0.00 - 0.07 K/uL    Comment: Performed at [facility], [address], [place],  [address]  Basic metabolic panel     Status: None   Collection Time: 05/04/18  9:53 AM  Result Value Ref Range   Sodium 138 135 -  145 mEq/L   Potassium 4.3 3.5 - 5.1 mEq/L   Chloride 102 96 - 112 mEq/L   CO2 29 19 - 32 mEq/L   Glucose, Bld 84 70 - 99 mg/dL   BUN 20 6 - 23 mg/dL   Creatinine, Ser 0.87 0.40 - 1.20 mg/dL   Calcium 9.2 8.4 - 10.5 mg/dL   GFR 68.43 >60.00 mL/min    Radiology No results found.  Assessment/Plan Essential hypertension blood pressure control important in reducing the progression of atherosclerotic disease. On appropriate oral medications.   Thoracic aortic aneurysm without rupture (HCC) 4.3 cm.  Following annually.  Hypercholesteremia lipid control important in reducing the progression of atherosclerotic disease. Continue statin therapy  Carotid stenosis, bilateral She has undergone a CT angiogram which I have independently reviewed.  The official report is of a 75% right ICA stenosis and a 50% left ICA stenosis.  As is usual, that is a gross underestimate on the right which is clearly greater than 75%.  The left side appears to be 50% or less to me.  Recommend:  The patient remains asymptomatic with respect to the carotid stenosis.  However, the patient has now progressed and has a lesion the is >75%.  Patient's CT angiography of the carotid arteries confirms >75% right ICA  stenosis.  The anatomical considerations support surgery over stenting.  This was discussed in detail with the patient.  The patient does indeed need surgery, therefore, cardiac clearance will be arranged. Once cleared the patient will be scheduled for surgery.  The risks, benefits and alternative therapies were reviewed in detail with the patient.  All questions were answered.  The patient agrees to proceed with surgery of the right carotid artery.  We took a significant amount of time today making sure we answered all of her questions and she seemed pleased with our visit today.  Continue antiplatelet therapy as prescribed. Continue management of CAD, HTN and Hyperlipidemia. Healthy heart diet, encouraged exercise at least 4 times per week.    Leotis Pain, MD  07/03/2018 9:13 AM    This note was created with Dragon medical transcription system.  Any errors from dictation are purely unintentional

## 2018-07-11 MED ORDER — CEFAZOLIN SODIUM-DEXTROSE 2-4 GM/100ML-% IV SOLN: 2.0000 g | INTRAVENOUS | Status: DC

## 2018-07-12 ENCOUNTER — Inpatient Hospital Stay: Payer: Medicare Other | Admitting: Anesthesiology

## 2018-07-12 ENCOUNTER — Other Ambulatory Visit: Payer: Self-pay

## 2018-07-12 ENCOUNTER — Encounter
Admission: RE | Disposition: A | Discharge status: Home / Self Care | Payer: Self-pay | Source: Home / Self Care | Attending: Vascular Surgery

## 2018-07-12 ENCOUNTER — Inpatient Hospital Stay
Admission: RE | Admit: 2018-07-12 | Discharge: 2018-07-13 | DRG: 027 | Disposition: A | Discharge status: Home / Self Care | Payer: Medicare Other | Attending: Vascular Surgery | Admitting: Vascular Surgery

## 2018-07-12 DIAGNOSIS — I2510 Atherosclerotic heart disease of native coronary artery without angina pectoris: Secondary | ICD-10-CM | POA: Diagnosis present

## 2018-07-12 DIAGNOSIS — Z79899 Other long term (current) drug therapy: Secondary | ICD-10-CM

## 2018-07-12 DIAGNOSIS — I712 Thoracic aortic aneurysm, without rupture: Secondary | ICD-10-CM | POA: Diagnosis present

## 2018-07-12 DIAGNOSIS — I6523 Occlusion and stenosis of bilateral carotid arteries: Principal | ICD-10-CM | POA: Diagnosis present

## 2018-07-12 DIAGNOSIS — F17210 Nicotine dependence, cigarettes, uncomplicated: Secondary | ICD-10-CM | POA: Diagnosis present

## 2018-07-12 DIAGNOSIS — E7800 Pure hypercholesterolemia, unspecified: Secondary | ICD-10-CM | POA: Diagnosis present

## 2018-07-12 DIAGNOSIS — Z23 Encounter for immunization: Secondary | ICD-10-CM | POA: Diagnosis not present

## 2018-07-12 DIAGNOSIS — J449 Chronic obstructive pulmonary disease, unspecified: Secondary | ICD-10-CM | POA: Diagnosis present

## 2018-07-12 DIAGNOSIS — E785 Hyperlipidemia, unspecified: Secondary | ICD-10-CM | POA: Diagnosis present

## 2018-07-12 DIAGNOSIS — I6521 Occlusion and stenosis of right carotid artery: Secondary | ICD-10-CM | POA: Diagnosis not present

## 2018-07-12 DIAGNOSIS — Z7982 Long term (current) use of aspirin: Secondary | ICD-10-CM

## 2018-07-12 DIAGNOSIS — I10 Essential (primary) hypertension: Secondary | ICD-10-CM | POA: Diagnosis present

## 2018-07-12 DIAGNOSIS — I739 Peripheral vascular disease, unspecified: Secondary | ICD-10-CM | POA: Diagnosis not present

## 2018-07-12 HISTORY — PX: ENDARTERECTOMY: SHX5162

## 2018-07-12 LAB — MRSA PCR SCREENING: MRSA by PCR: NEGATIVE

## 2018-07-12 LAB — ABO/RH: ABO/RH(D): A POS

## 2018-07-12 LAB — GLUCOSE, CAPILLARY: Glucose-Capillary: 99 mg/dL (ref 70–99)

## 2018-07-12 SURGERY — ENDARTERECTOMY, CAROTID
Anesthesia: General | Laterality: Right

## 2018-07-12 MED ORDER — DOCUSATE SODIUM 100 MG PO CAPS
100.0000 mg | ORAL_CAPSULE | Freq: Every day | ORAL | Status: DC
  Administered 2018-07-13: 100 mg via ORAL
  Filled 2018-07-12: qty 1

## 2018-07-12 MED ORDER — ASPIRIN EC 81 MG PO TBEC
81.0000 mg | DELAYED_RELEASE_TABLET | Freq: Every day | ORAL | Status: DC
  Administered 2018-07-13: 81 mg via ORAL
  Filled 2018-07-12: qty 1

## 2018-07-12 MED ORDER — OXYCODONE HCL 5 MG PO TABS: 5.0000 mg | ORAL_TABLET | Freq: Once | ORAL | Status: DC | PRN

## 2018-07-12 MED ORDER — OXYCODONE-ACETAMINOPHEN 5-325 MG PO TABS: 1.0000 | ORAL_TABLET | ORAL | Status: DC | PRN

## 2018-07-12 MED ORDER — FAMOTIDINE 20 MG PO TABS: 20.0000 mg | ORAL_TABLET | Freq: Once | ORAL | Status: DC

## 2018-07-12 MED ORDER — FAMOTIDINE 20 MG PO TABS
ORAL_TABLET | ORAL | Status: AC
  Filled 2018-07-12: qty 1

## 2018-07-12 MED ORDER — LIDOCAINE HCL 1 % IJ SOLN
INTRAMUSCULAR | Status: DC | PRN
  Administered 2018-07-12: 10 mL

## 2018-07-12 MED ORDER — SODIUM CHLORIDE 0.9 % IV SOLN
INTRAVENOUS | Status: DC
  Administered 2018-07-12: 13:00:00 via INTRAVENOUS

## 2018-07-12 MED ORDER — ACETAMINOPHEN 325 MG RE SUPP
325.0000 mg | RECTAL | Status: DC | PRN
  Filled 2018-07-12: qty 2

## 2018-07-12 MED ORDER — POTASSIUM CHLORIDE CRYS ER 20 MEQ PO TBCR: 20.0000 meq | EXTENDED_RELEASE_TABLET | Freq: Every day | ORAL | Status: DC | PRN

## 2018-07-12 MED ORDER — SODIUM CHLORIDE 0.9 % IV SOLN: 500.0000 mL | Freq: Once | INTRAVENOUS | Status: DC | PRN

## 2018-07-12 MED ORDER — MIDAZOLAM HCL 2 MG/2ML IJ SOLN
INTRAMUSCULAR | Status: DC | PRN
  Administered 2018-07-12: 1 mg via INTRAVENOUS

## 2018-07-12 MED ORDER — LISINOPRIL 5 MG PO TABS: 5.0000 mg | ORAL_TABLET | Freq: Every day | ORAL | Status: DC

## 2018-07-12 MED ORDER — GUAIFENESIN-DM 100-10 MG/5ML PO SYRP: 15.0000 mL | ORAL_SOLUTION | ORAL | Status: DC | PRN

## 2018-07-12 MED ORDER — HEPARIN SODIUM (PORCINE) 1000 UNIT/ML IJ SOLN
INTRAMUSCULAR | Status: DC | PRN
  Administered 2018-07-12: 500 [IU] via INTRAVENOUS

## 2018-07-12 MED ORDER — GLYCOPYRROLATE 0.2 MG/ML IJ SOLN
INTRAMUSCULAR | Status: DC | PRN
  Administered 2018-07-12 (×2): 0.2 mg via INTRAVENOUS

## 2018-07-12 MED ORDER — ROCURONIUM BROMIDE 100 MG/10ML IV SOLN
INTRAVENOUS | Status: DC | PRN
  Administered 2018-07-12: 50 mg via INTRAVENOUS

## 2018-07-12 MED ORDER — PROPOFOL 10 MG/ML IV BOLUS
INTRAVENOUS | Status: DC | PRN
  Administered 2018-07-12: 120 mg via INTRAVENOUS

## 2018-07-12 MED ORDER — LIDOCAINE HCL (PF) 1 % IJ SOLN
INTRAMUSCULAR | Status: AC
  Filled 2018-07-12: qty 30

## 2018-07-12 MED ORDER — CEFAZOLIN SODIUM-DEXTROSE 2-4 GM/100ML-% IV SOLN
INTRAVENOUS | Status: AC
  Filled 2018-07-12: qty 100

## 2018-07-12 MED ORDER — SODIUM CHLORIDE 0.9 % IV SOLN
INTRAVENOUS | Status: DC | PRN
  Administered 2018-07-12: 25 ug/min via INTRAVENOUS

## 2018-07-12 MED ORDER — FENTANYL CITRATE (PF) 100 MCG/2ML IJ SOLN
INTRAMUSCULAR | Status: DC | PRN
  Administered 2018-07-12 (×2): 50 ug via INTRAVENOUS

## 2018-07-12 MED ORDER — PHENYLEPHRINE HCL 10 MG/ML IJ SOLN
INTRAMUSCULAR | Status: DC | PRN
  Administered 2018-07-12: 50 ug via INTRAVENOUS
  Administered 2018-07-12 (×3): 100 ug via INTRAVENOUS
  Administered 2018-07-12: 50 ug via INTRAVENOUS
  Administered 2018-07-12: 100 ug via INTRAVENOUS

## 2018-07-12 MED ORDER — ONDANSETRON HCL 4 MG/2ML IJ SOLN
INTRAMUSCULAR | Status: AC
  Filled 2018-07-12: qty 2

## 2018-07-12 MED ORDER — ESMOLOL HCL-SODIUM CHLORIDE 2000 MG/100ML IV SOLN
INTRAVENOUS | Status: AC
  Filled 2018-07-12: qty 100

## 2018-07-12 MED ORDER — NITROGLYCERIN IN D5W 200-5 MCG/ML-% IV SOLN
INTRAVENOUS | Status: AC
  Filled 2018-07-12: qty 250

## 2018-07-12 MED ORDER — LACTATED RINGERS IV SOLN: INTRAVENOUS | Status: DC

## 2018-07-12 MED ORDER — PROMETHAZINE HCL 25 MG/ML IJ SOLN: 6.2500 mg | INTRAMUSCULAR | Status: DC | PRN

## 2018-07-12 MED ORDER — HEPARIN SODIUM (PORCINE) 10000 UNIT/ML IJ SOLN
INTRAMUSCULAR | Status: AC
  Filled 2018-07-12: qty 1

## 2018-07-12 MED ORDER — FENTANYL CITRATE (PF) 100 MCG/2ML IJ SOLN
INTRAMUSCULAR | Status: AC
  Filled 2018-07-12: qty 2

## 2018-07-12 MED ORDER — PHENOL 1.4 % MT LIQD
1.0000 | OROMUCOSAL | Status: DC | PRN
  Filled 2018-07-12: qty 177

## 2018-07-12 MED ORDER — NITROGLYCERIN 0.2 MG/ML ON CALL CATH LAB
INTRAVENOUS | Status: DC | PRN
  Administered 2018-07-12: 40 ug via INTRAVENOUS

## 2018-07-12 MED ORDER — METOPROLOL TARTRATE 5 MG/5ML IV SOLN: 2.0000 mg | INTRAVENOUS | Status: DC | PRN

## 2018-07-12 MED ORDER — EVICEL 2 ML EX KIT
PACK | CUTANEOUS | Status: DC | PRN
  Administered 2018-07-12: 2 mL

## 2018-07-12 MED ORDER — SUGAMMADEX SODIUM 200 MG/2ML IV SOLN
INTRAVENOUS | Status: DC | PRN
  Administered 2018-07-12: 200 mg via INTRAVENOUS

## 2018-07-12 MED ORDER — DEXAMETHASONE SODIUM PHOSPHATE 10 MG/ML IJ SOLN
INTRAMUSCULAR | Status: DC | PRN
  Administered 2018-07-12: 10 mg via INTRAVENOUS

## 2018-07-12 MED ORDER — VITAMIN D 25 MCG (1000 UNIT) PO TABS
1000.0000 [IU] | ORAL_TABLET | Freq: Every day | ORAL | Status: DC
  Administered 2018-07-13: 1000 [IU] via ORAL
  Filled 2018-07-12: qty 1

## 2018-07-12 MED ORDER — CEFAZOLIN SODIUM-DEXTROSE 2-4 GM/100ML-% IV SOLN
2.0000 g | Freq: Three times a day (TID) | INTRAVENOUS | Status: AC
  Administered 2018-07-12 (×2): 2 g via INTRAVENOUS
  Filled 2018-07-12 (×2): qty 100

## 2018-07-12 MED ORDER — ADULT MULTIVITAMIN W/MINERALS CH
1.0000 | ORAL_TABLET | Freq: Every day | ORAL | Status: DC
  Administered 2018-07-13: 1 via ORAL
  Filled 2018-07-12: qty 1

## 2018-07-12 MED ORDER — LABETALOL HCL 5 MG/ML IV SOLN: 10.0000 mg | INTRAVENOUS | Status: DC | PRN

## 2018-07-12 MED ORDER — FAMOTIDINE IN NACL 20-0.9 MG/50ML-% IV SOLN
20.0000 mg | Freq: Two times a day (BID) | INTRAVENOUS | Status: DC
  Administered 2018-07-12 (×2): 20 mg via INTRAVENOUS
  Filled 2018-07-12 (×2): qty 50

## 2018-07-12 MED ORDER — OXYCODONE HCL 5 MG/5ML PO SOLN: 5.0000 mg | Freq: Once | ORAL | Status: DC | PRN

## 2018-07-12 MED ORDER — ACETAMINOPHEN 325 MG PO TABS: 325.0000 mg | ORAL_TABLET | ORAL | Status: DC | PRN

## 2018-07-12 MED ORDER — EVICEL 2 ML EX KIT
PACK | CUTANEOUS | Status: AC
  Filled 2018-07-12: qty 1

## 2018-07-12 MED ORDER — LIDOCAINE HCL (CARDIAC) PF 100 MG/5ML IV SOSY
PREFILLED_SYRINGE | INTRAVENOUS | Status: DC | PRN
  Administered 2018-07-12: 100 mg via INTRAVENOUS

## 2018-07-12 MED ORDER — ONDANSETRON HCL 4 MG/2ML IJ SOLN
INTRAMUSCULAR | Status: DC | PRN
  Administered 2018-07-12: 4 mg via INTRAVENOUS

## 2018-07-12 MED ORDER — MEPERIDINE HCL 50 MG/ML IJ SOLN: 6.2500 mg | INTRAMUSCULAR | Status: DC | PRN

## 2018-07-12 MED ORDER — PROPOFOL 10 MG/ML IV BOLUS
INTRAVENOUS | Status: AC
  Filled 2018-07-12: qty 20

## 2018-07-12 MED ORDER — MORPHINE SULFATE (PF) 4 MG/ML IV SOLN: 2.0000 mg | INTRAVENOUS | Status: DC | PRN

## 2018-07-12 MED ORDER — ALUM & MAG HYDROXIDE-SIMETH 200-200-20 MG/5ML PO SUSP: 15.0000 mL | ORAL | Status: DC | PRN

## 2018-07-12 MED ORDER — NITROGLYCERIN IN D5W 200-5 MCG/ML-% IV SOLN: 5.0000 ug/min | INTRAVENOUS | Status: DC

## 2018-07-12 MED ORDER — LACTATED RINGERS IV SOLN
INTRAVENOUS | Status: DC | PRN
  Administered 2018-07-12: 10:00:00 via INTRAVENOUS

## 2018-07-12 MED ORDER — CLOPIDOGREL BISULFATE 75 MG PO TABS
75.0000 mg | ORAL_TABLET | Freq: Every day | ORAL | Status: DC
  Administered 2018-07-13: 75 mg via ORAL
  Filled 2018-07-12: qty 1

## 2018-07-12 MED ORDER — ESMOLOL HCL-SODIUM CHLORIDE 2000 MG/100ML IV SOLN
25.0000 ug/kg/min | INTRAVENOUS | Status: DC
  Administered 2018-07-12: 25 ug/kg/min via INTRAVENOUS
  Filled 2018-07-12: qty 100

## 2018-07-12 MED ORDER — ONDANSETRON HCL 4 MG/2ML IJ SOLN: 4.0000 mg | Freq: Four times a day (QID) | INTRAMUSCULAR | Status: DC | PRN

## 2018-07-12 MED ORDER — ATORVASTATIN CALCIUM 10 MG PO TABS
10.0000 mg | ORAL_TABLET | Freq: Every day | ORAL | Status: DC
  Administered 2018-07-13: 10 mg via ORAL
  Filled 2018-07-12: qty 1

## 2018-07-12 MED ORDER — MIDAZOLAM HCL 2 MG/2ML IJ SOLN
INTRAMUSCULAR | Status: AC
  Filled 2018-07-12: qty 2

## 2018-07-12 MED ORDER — MAGNESIUM SULFATE 2 GM/50ML IV SOLN: 2.0000 g | Freq: Every day | INTRAVENOUS | Status: DC | PRN

## 2018-07-12 MED ORDER — PNEUMOCOCCAL VAC POLYVALENT 25 MCG/0.5ML IJ INJ
0.5000 mL | INJECTION | INTRAMUSCULAR | Status: AC
  Administered 2018-07-13: 0.5 mL via INTRAMUSCULAR
  Filled 2018-07-12: qty 0.5

## 2018-07-12 MED ORDER — HYDRALAZINE HCL 20 MG/ML IJ SOLN: 5.0000 mg | INTRAMUSCULAR | Status: DC | PRN

## 2018-07-12 MED ORDER — AMLODIPINE BESYLATE 5 MG PO TABS: 5.0000 mg | ORAL_TABLET | Freq: Every day | ORAL | Status: DC

## 2018-07-12 MED ORDER — FENTANYL CITRATE (PF) 100 MCG/2ML IJ SOLN: 25.0000 ug | INTRAMUSCULAR | Status: DC | PRN

## 2018-07-12 SURGICAL SUPPLY — 62 items
BAG DECANTER FOR FLEXI CONT (MISCELLANEOUS) ×3 IMPLANT
BLADE SURG 15 STRL LF DISP TIS (BLADE) ×1 IMPLANT
BLADE SURG 15 STRL SS (BLADE) ×2
BLADE SURG SZ11 CARB STEEL (BLADE) ×3 IMPLANT
BOOT SUTURE AID YELLOW STND (SUTURE) ×3 IMPLANT
BRUSH SCRUB EZ  4% CHG (MISCELLANEOUS) ×2
BRUSH SCRUB EZ 4% CHG (MISCELLANEOUS) ×1 IMPLANT
CANISTER SUCT 1200ML W/VALVE (MISCELLANEOUS) ×3 IMPLANT
COVER WAND RF STERILE (DRAPES) ×3 IMPLANT
DERMABOND ADVANCED (GAUZE/BANDAGES/DRESSINGS) ×2
DERMABOND ADVANCED .7 DNX12 (GAUZE/BANDAGES/DRESSINGS) ×1 IMPLANT
DRAPE INCISE IOBAN 66X45 STRL (DRAPES) ×3 IMPLANT
DRAPE LAPAROTOMY 77X122 PED (DRAPES) ×3 IMPLANT
DRAPE SHEET LG 3/4 BI-LAMINATE (DRAPES) ×3 IMPLANT
DRSG TEGADERM 4X4.75 (GAUZE/BANDAGES/DRESSINGS) IMPLANT
DRSG TELFA 3X8 NADH (GAUZE/BANDAGES/DRESSINGS) IMPLANT
DURAPREP 26ML APPLICATOR (WOUND CARE) ×3 IMPLANT
ELECT CAUTERY BLADE 6.4 (BLADE) ×3 IMPLANT
ELECT REM PT RETURN 9FT ADLT (ELECTROSURGICAL) ×3
ELECTRODE REM PT RTRN 9FT ADLT (ELECTROSURGICAL) ×1 IMPLANT
GLOVE BIO SURGEON STRL SZ7 (GLOVE) ×9 IMPLANT
GLOVE INDICATOR 7.5 STRL GRN (GLOVE) ×3 IMPLANT
GOWN STRL REUS W/ TWL LRG LVL3 (GOWN DISPOSABLE) ×2 IMPLANT
GOWN STRL REUS W/ TWL XL LVL3 (GOWN DISPOSABLE) ×2 IMPLANT
GOWN STRL REUS W/TWL LRG LVL3 (GOWN DISPOSABLE) ×4
GOWN STRL REUS W/TWL XL LVL3 (GOWN DISPOSABLE) ×4
HEMOSTAT SURGICEL 2X3 (HEMOSTASIS) ×3 IMPLANT
IV NS 250ML (IV SOLUTION) ×2
IV NS 250ML BAXH (IV SOLUTION) ×1 IMPLANT
KIT TURNOVER KIT A (KITS) ×3 IMPLANT
LABEL OR SOLS (LABEL) ×3 IMPLANT
LOOP RED MAXI  1X406MM (MISCELLANEOUS) ×4
LOOP VESSEL MAXI 1X406 RED (MISCELLANEOUS) ×2 IMPLANT
LOOP VESSEL MINI 0.8X406 BLUE (MISCELLANEOUS) ×1 IMPLANT
LOOPS BLUE MINI 0.8X406MM (MISCELLANEOUS) ×2
NEEDLE FILTER BLUNT 18X 1/2SAF (NEEDLE) ×2
NEEDLE FILTER BLUNT 18X1 1/2 (NEEDLE) ×1 IMPLANT
NEEDLE HYPO 25X1 1.5 SAFETY (NEEDLE) ×3 IMPLANT
NS IRRIG 1000ML POUR BTL (IV SOLUTION) ×3 IMPLANT
PACK BASIN MAJOR ARMC (MISCELLANEOUS) ×3 IMPLANT
PATCH CAROTID ECM VASC 1X10 (Prosthesis & Implant Heart) ×3 IMPLANT
PENCIL ELECTRO HAND CTR (MISCELLANEOUS) IMPLANT
SHUNT W TPORT 9FR PRUITT F3 (SHUNT) ×3 IMPLANT
SUT MNCRL 4-0 (SUTURE) ×2
SUT MNCRL 4-0 27XMFL (SUTURE) ×1
SUT PROLENE 6 0 BV (SUTURE) ×12 IMPLANT
SUT PROLENE 7 0 BV 1 (SUTURE) ×6 IMPLANT
SUT SILK 2 0 (SUTURE) ×2
SUT SILK 2-0 18XBRD TIE 12 (SUTURE) ×1 IMPLANT
SUT SILK 3 0 (SUTURE) ×2
SUT SILK 3-0 18XBRD TIE 12 (SUTURE) ×1 IMPLANT
SUT SILK 4 0 (SUTURE) ×2
SUT SILK 4-0 18XBRD TIE 12 (SUTURE) ×1 IMPLANT
SUT VIC AB 3-0 SH 27 (SUTURE) ×4
SUT VIC AB 3-0 SH 27X BRD (SUTURE) ×2 IMPLANT
SUTURE MNCRL 4-0 27XMF (SUTURE) ×1 IMPLANT
SYR 10ML LL (SYRINGE) ×6 IMPLANT
SYR 20CC LL (SYRINGE) ×3 IMPLANT
TOWEL OR 17X26 4PK STRL BLUE (TOWEL DISPOSABLE) ×3 IMPLANT
TRAY FOLEY MTR SLVR 16FR STAT (SET/KITS/TRAYS/PACK) ×3 IMPLANT
TUBING CONNECTING 10 (TUBING) IMPLANT
TUBING CONNECTING 10' (TUBING)

## 2018-07-12 NOTE — Op Note (Signed)
[facility]   OPERATIVE NOTE  PROCEDURE:   1.  Right carotid endarterectomy with CorMatrix arterial patch reconstruction  PRE-OPERATIVE DIAGNOSIS: 1.  >75% right carotid stenosis   POST-OPERATIVE DIAGNOSIS: same as above   SURGEON: Leotis Pain, MD  ASSISTANT(S): Hezzie Bump, PA-C  ANESTHESIA: general  ESTIMATED BLOOD LOSS: 25 cc  FINDING(S): 1.  Right carotid plaque.  SPECIMEN(S):  Carotid plaque (sent to Pathology)  INDICATIONS:   Tracy Bell is a 68 y.o. female who presents with right carotid stenosis of >75%.  I discussed with the patient the risks, benefits, and alternatives to carotid endarterectomy.  I discussed the differences between carotid stenting and carotid endarterectomy. I discussed the procedural details of carotid endarterectomy with the patient.  The patient is aware that the risks of carotid endarterectomy include but are not limited to: bleeding, infection, stroke, myocardial infarction, death, cranial nerve injuries both temporary and permanent, neck hematoma, possible airway compromise, labile blood pressure post-operatively, cerebral hyperperfusion syndrome, and possible need for additional interventions in the future. The patient is aware of the risks and agrees to proceed forward with the procedure. An assistant was present during the procedure to help facilitate the exposure and expedite the procedure.  DESCRIPTION: After full informed written consent was obtained from the patient, the patient was brought back to the operating room and placed supine upon the operating table.  Prior to induction, the patient received IV antibiotics.  After obtaining adequate anesthesia, the patient was placed into a modified beach chair position with a shoulder roll in place and the patient's neck slightly hyperextended and rotated away from the surgical site.  The patient was prepped in the standard fashion for a carotid endarterectomy. The assistant  provided retraction and mobilization to help facilitate exposure and expedite the procedure throughout the entire procedure.  This included following suture, using retractors, and optimizing lighting.  I made an incision anterior to the sternocleidomastoid muscle and dissected down through the subcutaneous tissue.  The platysmas was opened with electrocautery.  Then I dissected down to the internal jugular vein.  There was not a dominant facial vein, but rather several smaller veins crossing over the carotid from the jugular vein.  These were sequentially ligated and divided between silk ties.  This was dissected posteriorly until I obtained visualization of the common carotid artery.  This was dissected out and then a vessel loop was placed around the common carotid artery.  I then dissected in a periadventitial fashion along the common carotid artery up to the bifurcation.  I then identified the external carotid artery and the superior thyroid artery.  I placed a vessel loop around the superior thyroid artery, and I also dissected out the external carotid artery and placed a vessel loop around it. In the process of this dissection, the hypoglossal nerve was identified and protected from harm.  I then dissected out the internal carotid artery until I identified an area in the internal carotid artery clearly above the stenosis.  I dissected slightly distal to this area, and placed a vessel loop around the artery.  At this point, we gave the patient 5000 units of intravenous heparin.  After this was allowed to circulate for several minutes, I pulled up control on the vessel loops to clamp the internal carotid artery, external carotid artery, superior thyroid artery, and then the common carotid artery.  I then made an arteriotomy in the common carotid artery with a 11 blade, and extended the arteriotomy  with a Potts scissor down into the common carotid artery, then I carried the arteriotomy through the bifurcation  into the internal carotid artery until I reached an area that was not diseased.  At this point, I took the Pruitt-[place] shunt that previously been prepared and I inserted it into the internal carotid artery first, and then into the common carotid artery taking care to flush and de-air prior to release of control. At this point, I started the endarterectomy in the common carotid artery with a Penfield elevator and carried this dissection down into the common carotid artery circumferentially.  Then I transected the plaque at a segment where it was adherent and transected the plaque with Potts scissors.  I then carried this dissection up into the external carotid artery.  The plaque was extracted by unclamping the external carotid artery and performing an eversion endarterectomy.  The dissection was then carried into the internal carotid artery where a nice feathered end point was created with gentle traction.  I passed the plaque off the field as a specimen. At this point I removed all loose flecks and remaining disease possible.  At this point, I was satisfied that the minimal remaining disease was densely adherent to the wall and wall integrity was intact. The distal endpoint was tacked down with two 7-0 Prolene sutures.  I then fashioned a CorMatrix arterial patch for the artery and sewed it in place with two running stitch of 6-0 Prolene.  I started at the distal endpoint and ran one half the length of the arteriotomy.  I then cut and beveled the patch to an appropriate length to match the arteriotomy.  I started the second 6-0 Prolene at the proximal end point.  The medial suture line was completed and the lateral suture line was run approximately one quarter the length of the arteriotomy.  Prior to completing this patch angioplasty, I removed the shunt first from the internal carotid artery, from which there was excellent backbleeding, and clamped it.  Then I removed the shunt from the common carotid artery,  from which there was excellent antegrade bleeding, and then clamped it.  At this point, I allowed the external carotid artery to backbleed, which was excellent.  Then I instilled heparinized saline in this patched artery and then completed the patch angioplasty in the usual fashion.  First, I released the clamp on the external carotid artery, then I released it on the common carotid artery.  After waiting a few seconds, I then released it on the internal carotid artery. Several minutes of pressure were held and 6-0 Prolene patch sutures were used as need for hemostasis.  At this point, I placed Surgicel and Evicel topical hemostatic agents.  There was no more active bleeding in the surgical site.  The sternocleidomastoid space was closed with three interrupted 3-0 Vicryl sutures. I then reapproximated the platysma muscle with a running stitch of 3-0 Vicryl.  The skin was then closed with a running subcuticular 4-0 Monocryl.  The skin was then cleaned, dried and Dermabond was used to reinforce the skin closure.  The patient awakened and was taken to the recovery room in stable condition, following commands and moving all four extremities without any apparent deficits.    COMPLICATIONS: none  CONDITION: stable  Leotis Pain  07/12/2018, 11:37 AM    This note was created with Dragon Medical transcription system. Any errors in dictation are purely unintentional.

## 2018-07-12 NOTE — H&P (Signed)
[facility] History & Physical Update  The patient was interviewed and re-examined.  The patient's previous History and Physical has been reviewed and is unchanged.  There is no change in the plan of care. We plan to proceed with the scheduled procedure.  Leotis Pain, MD  07/12/2018, 11:43 AM

## 2018-07-12 NOTE — Anesthesia Post-op Follow-up Note (Signed)
Anesthesia QCDR form completed.        

## 2018-07-12 NOTE — Anesthesia Procedure Notes (Signed)
Arterial Line Insertion Start/End3/04/2019 9:56 AM, 07/12/2018 10:01 AM Performed by: Emmie Niemann, MD, Justus Memory, CRNA, CRNA  Patient location: OR. Preanesthetic checklist: patient identified, IV checked, site marked, risks and benefits discussed, surgical consent, monitors and equipment checked, pre-op evaluation, timeout performed and anesthesia consent Lidocaine 1% used for infiltration Left, radial was placed Catheter size: 20 Fr Hand hygiene performed , maximum sterile barriers used  and Seldinger technique used Allen's test indicative of satisfactory collateral circulation Attempts: 1 Procedure performed without using ultrasound guided technique. Following insertion, dressing applied. Post procedure assessment: normal and unchanged  Patient tolerated the procedure well with no immediate complications.

## 2018-07-12 NOTE — Anesthesia Procedure Notes (Signed)
Procedure Name: Intubation Date/Time: 07/12/2018 9:59 AM Performed by: Emmie Niemann, MD Pre-anesthesia Checklist: Patient identified, Patient being monitored, Timeout performed, Emergency Drugs available and Suction available Patient Re-evaluated:Patient Re-evaluated prior to induction Oxygen Delivery Method: Circle system utilized Preoxygenation: Pre-oxygenation with 100% oxygen Induction Type: IV induction Ventilation: Mask ventilation without difficulty Laryngoscope Size: Mac and 3 Grade View: Grade II Tube type: Oral Tube size: 7.0 mm Number of attempts: 1 Airway Equipment and Method: Stylet Placement Confirmation: ETT inserted through vocal cords under direct vision,  positive ETCO2 and breath sounds checked- equal and bilateral Secured at: 23 cm Tube secured with: Tape Dental Injury: Teeth and Oropharynx as per pre-operative assessment

## 2018-07-12 NOTE — Anesthesia Postprocedure Evaluation (Signed)
Anesthesia Post Note  Patient: Tracy Bell  Procedure(s) Performed: ENDARTERECTOMY CAROTID (Right )  Patient location during evaluation: PACU Anesthesia Type: General Level of consciousness: awake and alert and oriented Pain management: pain level controlled Vital Signs Assessment: post-procedure vital signs reviewed and stable Respiratory status: spontaneous breathing, nonlabored ventilation and respiratory function stable Cardiovascular status: blood pressure returned to baseline and stable Postop Assessment: no signs of nausea or vomiting Anesthetic complications: no     Last Vitals:  Vitals:   07/12/18 1332 07/12/18 1345  BP:  114/72  Pulse: (!) 58 61  Resp: 12 16  Temp: 36.6 C   SpO2: 94% 95%    Last Pain:  Vitals:   07/12/18 1332  TempSrc: Oral                 Cassi Jenne

## 2018-07-12 NOTE — Anesthesia Preprocedure Evaluation (Signed)
Anesthesia Evaluation  Patient identified by MRN, date of birth, ID band Patient awake    Reviewed: Allergy & Precautions, NPO status , Patient's Chart, lab work & pertinent test results  History of Anesthesia Complications Negative for: history of anesthetic complications  Airway Mallampati: II  TM Distance: >3 FB Neck ROM: Full    Dental no notable dental hx.    Pulmonary neg sleep apnea, COPD, Current Smoker,    breath sounds clear to auscultation- rhonchi (-) wheezing      Cardiovascular hypertension, Pt. on medications + Peripheral Vascular Disease  (-) CAD, (-) Past MI, (-) Cardiac Stents and (-) CABG  Rhythm:Regular Rate:Normal - Systolic murmurs and - Diastolic murmurs Echo 16/10/96: NORMAL LEFT VENTRICULAR SYSTOLIC FUNCTION  WITH MODERATE LVH NORMAL RIGHT VENTRICULAR SYSTOLIC FUNCTION MILD VALVULAR REGURGITATION  MILD VALVULAR STENOSIS  AVA(VTI)=1.16cm^2 MILD AR, MR, TR, PR MILD AS EF >55%   Neuro/Psych neg Seizures PSYCHIATRIC DISORDERS Anxiety Depression negative neurological ROS     GI/Hepatic negative GI ROS, Neg liver ROS,   Endo/Other  negative endocrine ROSneg diabetes  Renal/GU negative Renal ROS     Musculoskeletal negative musculoskeletal ROS (+)   Abdominal (+) - obese,   Peds  Hematology negative hematology ROS (+)   Anesthesia Other Findings Past Medical History: No date: Anxiety No date: COPD (chronic obstructive pulmonary disease) (HCC) No date: Cough No date: Depression No date: Dizziness No date: Dyspnea No date: Heart murmur No date: Hyperlipidemia No date: Hypertension No date: Lightheadedness 08/28/2015: Personal history of tobacco use, presenting hazards to  health   Reproductive/Obstetrics                             Anesthesia Physical Anesthesia Plan  ASA: III  Anesthesia Plan: General   Post-op Pain Management:    Induction:  Intravenous  PONV Risk Score and Plan: 1 and Ondansetron and Midazolam  Airway Management Planned: Oral ETT  Additional Equipment: Arterial line  Intra-op Plan:   Post-operative Plan: Extubation in OR  Informed Consent: I have reviewed the patients History and Physical, chart, labs and discussed the procedure including the risks, benefits and alternatives for the proposed anesthesia with the patient or authorized representative who has indicated his/her understanding and acceptance.     Dental advisory given  Plan Discussed with: CRNA and Anesthesiologist  Anesthesia Plan Comments:         Anesthesia Quick Evaluation

## 2018-07-12 NOTE — Transfer of Care (Signed)
Immediate Anesthesia Transfer of Care Note  Patient: Tracy Bell  Procedure(s) Performed: ENDARTERECTOMY CAROTID (Right )  Patient Location: PACU  Anesthesia Type:General  Level of Consciousness: sedated  Airway & Oxygen Therapy: Patient Spontanous Breathing and Patient connected to face mask oxygen  Post-op Assessment: Report given to RN and Post -op Vital signs reviewed and stable  Post vital signs: Reviewed and stable  Last Vitals:  Vitals Value Taken Time  BP    Temp    Pulse 86 07/12/2018 12:03 PM  Resp 19 07/12/2018 12:03 PM  SpO2 100 % 07/12/2018 12:03 PM  Vitals shown include unvalidated device data.  Last Pain: There were no vitals filed for this visit.       Complications: none

## 2018-07-12 NOTE — Plan of Care (Signed)

## 2018-07-13 DIAGNOSIS — I6521 Occlusion and stenosis of right carotid artery: Secondary | ICD-10-CM

## 2018-07-13 DIAGNOSIS — Z9889 Other specified postprocedural states: Secondary | ICD-10-CM

## 2018-07-13 LAB — BASIC METABOLIC PANEL
Anion gap: 8 (ref 5–15)
BUN: 12 mg/dL (ref 8–23)
CHLORIDE: 106 mmol/L (ref 98–111)
CO2: 24 mmol/L (ref 22–32)
CREATININE: 0.68 mg/dL (ref 0.44–1.00)
Calcium: 8.5 mg/dL — ABNORMAL LOW (ref 8.9–10.3)
GFR calc Af Amer: >60 mL/min (ref >60)
GFR calc non Af Amer: >60 mL/min (ref >60)
Glucose, Bld: 123 mg/dL — ABNORMAL HIGH (ref 70–99)
Potassium: 4.6 mmol/L (ref 3.5–5.1)
SODIUM: 138 mmol/L (ref 135–145)

## 2018-07-13 LAB — CBC
HCT: 40.4 % (ref 36.0–46.0)
Hemoglobin: 13.2 g/dL (ref 12.0–15.0)
MCH: 30.7 pg (ref 26.0–34.0)
MCHC: 32.7 g/dL (ref 30.0–36.0)
MCV: 94 fL (ref 80.0–100.0)
Platelets: 181 10*3/uL (ref 150–400)
RBC: 4.3 MIL/uL (ref 3.87–5.11)
RDW: 13.2 % (ref 11.5–15.5)
WBC: 14.3 10*3/uL — ABNORMAL HIGH (ref 4.0–10.5)
nRBC: 0 % (ref 0.0–0.2)

## 2018-07-13 LAB — SURGICAL PATHOLOGY

## 2018-07-13 MED ORDER — CLOPIDOGREL BISULFATE 75 MG PO TABS: 75.0000 mg | ORAL_TABLET | Freq: Every day | ORAL | 3 refills | Status: DC

## 2018-07-13 MED ORDER — FAMOTIDINE 20 MG PO TABS
20.0000 mg | ORAL_TABLET | Freq: Two times a day (BID) | ORAL | Status: DC
  Administered 2018-07-13: 20 mg via ORAL
  Filled 2018-07-13: qty 1

## 2018-07-13 NOTE — Discharge Summary (Signed)
[facility]    Discharge Summary  Patient ID:  Tracy Bell MRN: [id number] DOB/AGE: 70-25-1952 70 y.o.  Admit date: 07/12/2018 Discharge date: 07/13/2018 Date of Surgery: 07/12/2018 Surgeon: Surgeon(s): Lucky Cowboy Erskine Squibb, MD  Admission Diagnosis: CAROTID ARTERY STENOSIS  Discharge Diagnoses:  CAROTID ARTERY STENOSIS  Secondary Diagnoses: Past Medical History:  Diagnosis Date  . Anxiety   . COPD (chronic obstructive pulmonary disease) ([place])   . Cough   . Depression   . Dizziness   . Dyspnea   . Heart murmur   . Hyperlipidemia   . Hypertension   . Lightheadedness   . Personal history of tobacco use, presenting hazards to health 08/28/2015   Procedure(s): ENDARTERECTOMY CAROTID  Discharged Condition: good  HPI:  Tracy Bell is a 68 y.o. female who presents with right carotid stenosis of >75%. On July 12, 2018, the patient underwent: 1.  Right carotid endarterectomy with CorMatrix arterial patch reconstruction The patient tolerated the procedure well and was transferred to the recovery room then the ICU for observation overnight without issue.  The patient's night of surgery was unremarkable.  On postop day #1/day of discharge, the patient was tolerating regular diet, her pain was controlled with the use of p.o. pain medication, she was urinating independently and ambulating without issue.  The patient's physical exam was unremarkable.  Her vital signs were stable.  Hospital Course:  Tracy Bell is a 68 y.o. female is S/P Right  Procedure(s): ENDARTERECTOMY CAROTID  Extubated: POD # 0  Physical exam:  Alert and oriented x3, no acute distress Face: Symmetrical, tongue midline Neck: Right carotid endarterectomy incision clean dry and intact.  No swelling.  Minimal ecchymosis noted.  Trachea midline. CV: Regular rate rhythm Pulmonary: Good auscultation bilaterally Abdomen: Soft, nontender, distended, positive bowel  sounds Extremities: Distally to the toes, nontender, no edema Neuro: Upper/lower extremities 5 out of 5, motor/sensory intact, no deficits noted  Post-op wounds clean, dry, intact or healing well  Pt. Ambulating, voiding and taking PO diet without difficulty.  Pt pain controlled with PO pain meds.  Labs as below  Complications: None  Consults: None  Significant Diagnostic Studies: CBC Lab Results  Component Value Date   WBC 14.3 (H) 07/13/2018   HGB 13.2 07/13/2018   HCT 40.4 07/13/2018   MCV 94.0 07/13/2018   PLT 181 07/13/2018   BMET    Component Value Date/Time   NA 138 07/13/2018 0425   K 4.6 07/13/2018 0425   CL 106 07/13/2018 0425   CO2 24 07/13/2018 0425   GLUCOSE 123 (H) 07/13/2018 0425   BUN 12 07/13/2018 0425   CREATININE 0.68 07/13/2018 0425   CALCIUM 8.5 (L) 07/13/2018 0425   GFRNONAA >60 07/13/2018 0425   GFRAA >60 07/13/2018 0425   COAG Lab Results  Component Value Date   INR 1.0 07/03/2018   Disposition:  Discharge to :Home  Allergies as of 07/13/2018   No Known Allergies     Medication List    TAKE these medications   amLODipine 5 MG tablet Commonly known as:  NORVASC Take 1 tablet (5 mg total) by mouth daily.   aspirin EC 81 MG tablet Take 1 tablet (81 mg total) by mouth daily.   atorvastatin 10 MG tablet Commonly known as:  LIPITOR Take 1 tablet (10 mg total) by mouth daily.   Centrum Adults Tabs Take 1 tablet by mouth daily.   cholecalciferol 25 MCG (1000 UT) tablet Commonly known as:  VITAMIN D3 Take 1,000 Units by mouth daily.   clopidogrel 75 MG tablet Commonly known as:  PLAVIX Take 1 tablet (75 mg total) by mouth daily with breakfast. Start taking on:  July 14, 2018   lisinopril 5 MG tablet Commonly known as:  PRINIVIL,ZESTRIL Take 1 tablet (5 mg total) by mouth daily.      Verbal and written Discharge instructions given to the patient. Wound care per Discharge AVS Follow-up Information    Dew, Erskine Squibb, MD  Follow up in 1 week(s).   Specialties:  Vascular Surgery, Radiology, Interventional Cardiology Why:  Can see midlevel. First post-op incision check. No studies.  Contact information: [place] [address] [phone]          Signed: Sela Hua, PA-C  07/13/2018, 12:43 PM

## 2018-07-13 NOTE — Progress Notes (Signed)
PHARMACIST - PHYSICIAN COMMUNICATION  CONCERNING: IV to Oral Route Change Policy  RECOMMENDATION: This patient is receiving famotidine by the intravenous route.  Based on criteria approved by the Pharmacy and Therapeutics Committee, the intravenous medication(s) is/are being converted to the equivalent oral dose form(s).   DESCRIPTION: These criteria include:  The patient is eating (either orally or via tube) and/or has been taking other orally administered medications for a least 24 hours  The patient has no evidence of active gastrointestinal bleeding or impaired GI absorption (gastrectomy, short bowel, patient on TNA or NPO).  If you have questions about this conversion, please contact the Pharmacy Department  []   [phone] )  [facility] [x]   [phone] )  [facility] []   [phone] )  [facility] []   [phone] )  [facility] []   [phone] )  [place], [facility] 07/13/2018 9:46 AM

## 2018-07-13 NOTE — Plan of Care (Signed)

## 2018-07-13 NOTE — Progress Notes (Signed)
Patient is AA+Ox4. Saline locked and tolerating PO intake well. Foley and Art line removed at 0430 this AM. Waiting for Surgery team to round. Potential discharge to home today. No longer requires ICU level-of-care.

## 2018-07-13 NOTE — Progress Notes (Signed)
Discharge teaching completed with the patient and her son. Patient is given all of the D/C paperwork, including her new prescription. RN reviews the entire D/C summary with the patient and all questions are answered. The patient leaves the unit in good general physical condition with a patient transporter rolling her out in a wheelchair.

## 2018-07-13 NOTE — Discharge Instructions (Signed)
You may shower as of Saturday. Keep incision clean and dry.  No driving until follow up appointment.

## 2018-07-16 ENCOUNTER — Telehealth (INDEPENDENT_AMBULATORY_CARE_PROVIDER_SITE_OTHER): Payer: Self-pay

## 2018-07-16 NOTE — Telephone Encounter (Signed)
Patient had right CEA on the 07/12/18 and called asking how much advil she can take for pain.I spoke with Fallon(N.P) and she advise for the patient to take 400mg  advil and alternate with Tylenol 1000 but the patient stated she did not have any Tylenol so Arna Medici advise for the patient to take 400mg  of Advil every 6hrs. I informed the patient to call the office if advil does not help with the pain.

## 2018-07-24 ENCOUNTER — Ambulatory Visit (INDEPENDENT_AMBULATORY_CARE_PROVIDER_SITE_OTHER): Payer: Medicare Other | Admitting: Vascular Surgery

## 2018-07-24 ENCOUNTER — Telehealth (INDEPENDENT_AMBULATORY_CARE_PROVIDER_SITE_OTHER): Payer: Self-pay | Admitting: Vascular Surgery

## 2018-07-24 DIAGNOSIS — I6523 Occlusion and stenosis of bilateral carotid arteries: Secondary | ICD-10-CM

## 2018-07-24 NOTE — Telephone Encounter (Signed)
Patient having some palpitations intermittently.  Otherwise having typical numbness in the neck after surgery and doing well.  She is going to call her cardiologist office to discuss this and is scheduled to see her PCP later this week.  She has no restrictions from our standpoint at this point.  RTC 2-3 months with duplex.

## 2018-07-27 ENCOUNTER — Other Ambulatory Visit: Payer: Self-pay

## 2018-07-27 ENCOUNTER — Ambulatory Visit (INDEPENDENT_AMBULATORY_CARE_PROVIDER_SITE_OTHER): Payer: Medicare Other | Admitting: Family

## 2018-07-27 ENCOUNTER — Encounter: Payer: Self-pay | Admitting: Family

## 2018-07-27 VITALS — BP 122/80 | HR 83 | Temp 98.4°F | Wt 148.6 lb

## 2018-07-27 DIAGNOSIS — R011 Cardiac murmur, unspecified: Secondary | ICD-10-CM | POA: Diagnosis not present

## 2018-07-27 DIAGNOSIS — D751 Secondary polycythemia: Secondary | ICD-10-CM | POA: Diagnosis not present

## 2018-07-27 DIAGNOSIS — R002 Palpitations: Secondary | ICD-10-CM | POA: Diagnosis not present

## 2018-07-27 DIAGNOSIS — I6523 Occlusion and stenosis of bilateral carotid arteries: Secondary | ICD-10-CM | POA: Diagnosis not present

## 2018-07-27 DIAGNOSIS — F17200 Nicotine dependence, unspecified, uncomplicated: Secondary | ICD-10-CM | POA: Diagnosis not present

## 2018-07-27 DIAGNOSIS — I10 Essential (primary) hypertension: Secondary | ICD-10-CM

## 2018-07-27 LAB — CBC WITH DIFFERENTIAL/PLATELET
Basophils Absolute: 0.1 10*3/uL (ref 0.0–0.1)
Basophils Relative: 0.7 % (ref 0.0–3.0)
Eosinophils Absolute: 0.3 10*3/uL (ref 0.0–0.7)
Eosinophils Relative: 2.3 % (ref 0.0–5.0)
HCT: 44.6 % (ref 36.0–46.0)
Hemoglobin: 15.2 g/dL — ABNORMAL HIGH (ref 12.0–15.0)
Lymphocytes Relative: 25.2 % (ref 12.0–46.0)
Lymphs Abs: 2.9 10*3/uL (ref 0.7–4.0)
MCHC: 34.1 g/dL (ref 30.0–36.0)
MCV: 92.1 fl (ref 78.0–100.0)
MONO ABS: 0.9 10*3/uL (ref 0.1–1.0)
Monocytes Relative: 7.8 % (ref 3.0–12.0)
Neutro Abs: 7.3 10*3/uL (ref 1.4–7.7)
Neutrophils Relative %: 64 % (ref 43.0–77.0)
Platelets: 279 10*3/uL (ref 150.0–400.0)
RBC: 4.84 Mil/uL (ref 3.87–5.11)
RDW: 13.3 % (ref 11.5–15.5)
WBC: 11.3 10*3/uL — ABNORMAL HIGH (ref 4.0–10.5)

## 2018-07-27 NOTE — Assessment & Plan Note (Signed)
Advised again that she discusses murmur, surveillance thereof she sees cardiologist at follow-up.  She was understanding of this.

## 2018-07-27 NOTE — Assessment & Plan Note (Signed)
Last hemoglobin 13. Pending repeat CBC today due to WBCs.

## 2018-07-27 NOTE — Assessment & Plan Note (Signed)
Discussed chantix v wellbutrin. Patient is undecided and will let me know what she decides to pursue. Information given for both. Will follow

## 2018-07-27 NOTE — Progress Notes (Signed)
Subjective:    Tracy Bell ID: Tracy Bell, female    DOB: May 31, 1950, 68 y.o.   MRN: [id number]  CC: Tracy Bell is a 68 y.o. female who presents today for follow up.   HPI: Smoking - interested in chantix. Has wellbutrin at home and may try that again.  Smokes pack and half cigarettes per day. First cigarette is 15 minutes after getting up in the morning. No h/o SI.   HTN- on lisinopril. Doing well on regimen.  No CP, sob, dizziness, syncope.   Murmur- hasnt discussed with cardiology  Has had a couple of episodes of 'beating' feeling over right side of neck and 'throat closing'. This has not changed, increased since right enterectomy. Episodes have been occurring this way for past 2-3 years, unchanged. Episodes last from couple of minutes to half hour.  Can occur at any time of day, no pattern.  Doesn't feel particularly anxious. Drinking 2 cups coffee per day.   Follows with [facility]. Appears had 72 holter in December; unable to see results.   Elevated RBCs- chronic.  Seen by Josefa Half 04/2018. Deferred beta blockers.  Following with Dr Lucky Cowboy , carotid stenosis, s/p endarectomy on 07/12/18.  HISTORY:  Past Medical History:  Diagnosis Date  . Anxiety   . COPD (chronic obstructive pulmonary disease) ([place])   . Cough   . Depression   . Dizziness   . Dyspnea   . Heart murmur   . Hyperlipidemia   . Hypertension   . Lightheadedness   . Personal history of tobacco use, presenting hazards to health 08/28/2015   Past Surgical History:  Procedure Laterality Date  . ABDOMINAL HYSTERECTOMY  1980   complete  . BREAST BIOPSY  1970   normal  . DILATION AND CURETTAGE OF UTERUS    . ENDARTERECTOMY Right 07/12/2018   Procedure: ENDARTERECTOMY CAROTID;  Surgeon: Algernon Huxley, MD;  Location: [facility] ORS;  Service: Vascular;  Laterality: Right;  . TUBAL LIGATION     Family History  Problem Relation Age of Onset  . Cancer Mother        Pancreatic Cancer   . Cancer Father        Stomach  Cancer   . Lung cancer Sister   . Cancer Sister        ovarian  . Lung cancer Sister     Allergies: Tracy Bell has no known allergies. Current Outpatient Medications on File Prior to Visit  Medication Sig Dispense Refill  . amLODipine (NORVASC) 5 MG tablet Take 1 tablet (5 mg total) by mouth daily. 90 tablet 3  . aspirin EC 81 MG tablet Take 1 tablet (81 mg total) by mouth daily. 30 tablet 6  . atorvastatin (LIPITOR) 10 MG tablet Take 1 tablet (10 mg total) by mouth daily. 90 tablet 3  . cholecalciferol (VITAMIN D3) 25 MCG (1000 UT) tablet Take 1,000 Units by mouth daily.    . clopidogrel (PLAVIX) 75 MG tablet Take 1 tablet (75 mg total) by mouth daily with breakfast. 90 tablet 3  . lisinopril (PRINIVIL,ZESTRIL) 5 MG tablet Take 1 tablet (5 mg total) by mouth daily. 90 tablet 3  . Multiple Vitamins-Minerals (CENTRUM ADULTS) TABS Take 1 tablet by mouth daily. 30 tablet 6   No current facility-administered medications on file prior to visit.     Social History   Tobacco Use  . Smoking status: Current Some Day Smoker    Packs/day: 2.00    Years: 60.00    Pack  years: 120.00  . Smokeless tobacco: Never Used  Substance Use Topics  . Alcohol use: Yes    Alcohol/week: 0.0 standard drinks    Comment: occasionally  . Drug use: No    Review of Systems  Constitutional: Negative for chills and fever.  Respiratory: Negative for cough.   Cardiovascular: Positive for palpitations. Negative for chest pain and leg swelling.  Gastrointestinal: Negative for nausea and vomiting.  Neurological: Negative for syncope and weakness.      Objective:    BP 122/80   Pulse 83   Temp 98.4 F (36.9 C)   Wt 148 lb 9.6 oz (67.4 kg)   SpO2 95%   BMI 23.98 kg/m  BP Readings from Last 3 Encounters:  07/27/18 122/80  07/13/18 131/85  07/03/18 (!) 145/85   Wt Readings from Last 3 Encounters:  07/27/18 148 lb 9.6 oz (67.4 kg)  07/12/18 149 lb 0.5 oz (67.6 kg)  07/03/18 149 lb 6.4 oz (67.8 kg)     Physical Exam Vitals signs reviewed.  Constitutional:      Appearance: She is well-developed.  Eyes:     Conjunctiva/sclera: Conjunctivae normal.  Cardiovascular:     Rate and Rhythm: Normal rate and regular rhythm.     Pulses: Normal pulses.     Heart sounds: Murmur present. Systolic murmur present with a grade of 2/6.     Comments: SEM II/VI, Loudest LSB, non radiating, no thrill  Pulmonary:     Effort: Pulmonary effort is normal.     Breath sounds: Normal breath sounds. No wheezing, rhonchi or rales.  Skin:    General: Skin is warm and dry.  Neurological:     Mental Status: She is alert.  Psychiatric:        Speech: Speech normal.        Behavior: Behavior normal.        Thought Content: Thought content normal.        Assessment & Plan:   Problem List Items Addressed This Visit      Cardiovascular and Mediastinum   Essential hypertension    At goal, continue current regimen        Other   Tobacco use disorder - Primary    Discussed chantix v wellbutrin. Tracy Bell is undecided and will let me know what she decides to pursue. Information given for both. Will follow      Murmur    Advised again that she discusses murmur, surveillance thereof she sees cardiologist at follow-up.  She was understanding of this.      Erythrocytosis    Last hemoglobin 13. Pending repeat CBC today due to WBCs.       Relevant Orders   CBC with Differential/Platelet   Palpitations    Chronic, unchanged. Declines EKG in office today. No episode today. Self limiting and no apparent trigger. Unable to see Holter monitor with cardiology office however it appears it may have been done in December.  I have contacted Dr Josefa Half via Epic and also my nurse will call their office to inquire about Tracy Bell having a longer Holter monitor.  Tracy Bell is aware of this; she will also call Dr. Anitra Lauth office to schedule her follow-up as she is due.          I am having Tracy Bell maintain  her aspirin EC, Centrum Adults, amLODipine, atorvastatin, cholecalciferol, lisinopril, and clopidogrel.   No orders of the defined types were placed in this encounter.   Return precautions given.  Risks, benefits, and alternatives of the medications and treatment plan prescribed today were discussed, and Tracy Bell expressed understanding.   Education regarding symptom management and diagnosis given to Tracy Bell on AVS.  Continue to follow with Burnard Hawthorne, FNP for routine health maintenance.   Belladonna M Whittington and I agreed with plan.   [name], FNP

## 2018-07-27 NOTE — Assessment & Plan Note (Signed)
At goal, continue current regimen 

## 2018-07-27 NOTE — Assessment & Plan Note (Signed)
Chronic, unchanged. Declines EKG in office today. No episode today. Self limiting and no apparent trigger. Unable to see Holter monitor with cardiology office however it appears it may have been done in December.  I have contacted Dr Josefa Half via Epic and also my nurse will call their office to inquire about patient having a longer Holter monitor.  Patient is aware of this; she will also call Dr. Anitra Lauth office to schedule her follow-up as she is due.

## 2018-07-27 NOTE — Progress Notes (Signed)
I spoke with Lattie Haw from Dr. Suzan Garibaldi office & asked her to sent a message back to his nurse. I asked that we just make dr. Josefa Half aware of note sent by [name].

## 2018-07-27 NOTE — Patient Instructions (Addendum)
Make a follow up with Dr Josefa Half ( cardiology) ; you are due.   Please refrain from coffee and all caffeine. Caffeine and nicotine can be stimulating.   We will call you also in regards to Holter Monitor.   Let me know how you are doing and whether you start wellbutrin or would like a prescription for chantix.   The below is how you take Chantix.  Please tell family , close that you are starting Chantix so they can not only support you however also make sure you do not show any unsual thoughts, behavior- this is rare however I want you to be vigilant.    Make a plan to slowly decrease smoking as you are on the chantix until your quit date. ( see below for detailed instructions).    Be mindful of  common side effects- mainly GI upset, so please TAKE with FOOD. You may also have strange dreams, however this too is less common.   Select a quit date within 7 days of starting Chantix. Goal is you should stop smoking within 8 to 35 days of starting chantix  Initial: Days 1 to 3: 0.5 mg by mouth once daily Days 4 to 7: 0.5 mg by mouth twice daily   Maintenance (? Day 8): 1 mg by mouth twice daily for 11 weeks. We may consider a temporary or permanent dose reduction if usual dose of 1 mg twice per day is not tolerated.   Slow decrease of smoking:   If you are not able or willing to quit abruptly, begin treatment with vareniciline ( Chantix) and reduce smoking by 50% from baseline within the first 4 weeks, by an additional 50% in the next 4 weeks, and continue reducing with the goal of complete abstinence by 12 weeks. If successfully quits smoking at the end of the 12 weeks, may continue for another 12 weeks to help maintain success. If you are motivated to quit and do not succeed in stopping smoking during prior therapy, or relapse after treatment, I would encourage you to make another attempt with varenicline ( Chantix) once factors contributing to the failed attempt have been identified and  addressed.  The below is how you take Bupropion hydrochloride, brand name Zyban ( same medication as Wellbutrin)  for smoking cessation:    First 3 days, take 150mg  tablet once per day ( always take prior to 6pm, usually recommend just taking in the morning). After that, take one tablet twice per day. Goal is stop smoking 5-7 days after starting treatment so ensure you have picked a quit date. I recommend treating for at least 12 weeks.  You may stay on the 150 mg/day (rather than 300 mg/day) if you do not tolerate the full dose due to side effects. Also note that longer-duration therapy may prevent relapse in successful quitters so let me know if you would like longer therapy.    Most of all, GOOD LUCK!

## 2018-08-01 ENCOUNTER — Other Ambulatory Visit: Payer: Self-pay | Admitting: Family

## 2018-08-01 DIAGNOSIS — R899 Unspecified abnormal finding in specimens from other organs, systems and tissues: Secondary | ICD-10-CM

## 2018-09-28 ENCOUNTER — Other Ambulatory Visit: Payer: Self-pay

## 2018-10-31 ENCOUNTER — Ambulatory Visit: Payer: PRIVATE HEALTH INSURANCE | Admitting: Family

## 2018-11-30 ENCOUNTER — Other Ambulatory Visit: Payer: Self-pay

## 2019-03-20 ENCOUNTER — Telehealth: Payer: Self-pay | Admitting: *Deleted

## 2019-03-20 DIAGNOSIS — Z87891 Personal history of nicotine dependence: Secondary | ICD-10-CM

## 2019-03-20 DIAGNOSIS — Z122 Encounter for screening for malignant neoplasm of respiratory organs: Secondary | ICD-10-CM

## 2019-03-20 NOTE — Telephone Encounter (Signed)
Tracy Bell has been notified that lung cancer screening CT scan is due currently or will be in near future. Confirmed that patient is within the appropriate age range, and that she currently has no signs of lung cancer. Patient denies any illness that would prevent curative treatment for lung cancer if found. Verified smoking history. She is a current smoker and mentioned that she smokes a pack and a half of cigarettes per day. Patient is agreeable for CT scan being scheduled. She prefers for her scan to be scheduled between Monday-Thursday in the morning around 10am if possible. I informed her that she will be called within the next couple of days to get her scheduled for a CT scan.

## 2019-03-22 NOTE — Addendum Note (Signed)
Addended by: Lieutenant Diego on: 03/22/2019 09:47 AM   Modules accepted: Orders

## 2019-03-22 NOTE — Telephone Encounter (Signed)
Smoking history current, 56.5 pack year

## 2019-04-02 ENCOUNTER — Other Ambulatory Visit: Payer: Self-pay

## 2019-04-02 ENCOUNTER — Ambulatory Visit
Admission: RE | Admit: 2019-04-02 | Discharge: 2019-04-02 | Disposition: A | Discharge status: Home / Self Care | Payer: Medicare Other | Source: Ambulatory Visit | Attending: Nurse Practitioner | Admitting: Nurse Practitioner

## 2019-04-02 DIAGNOSIS — Z87891 Personal history of nicotine dependence: Secondary | ICD-10-CM | POA: Diagnosis not present

## 2019-04-02 DIAGNOSIS — F17210 Nicotine dependence, cigarettes, uncomplicated: Secondary | ICD-10-CM | POA: Diagnosis not present

## 2019-04-02 DIAGNOSIS — Z122 Encounter for screening for malignant neoplasm of respiratory organs: Secondary | ICD-10-CM | POA: Insufficient documentation

## 2019-04-03 ENCOUNTER — Telehealth: Payer: Self-pay | Admitting: Internal Medicine

## 2019-04-03 NOTE — Progress Notes (Signed)
Reviewed see note in chart  [place]

## 2019-04-03 NOTE — Telephone Encounter (Signed)
I spoke with patient & reviewed the following. She wanted  Me to make her a f/u in the new year. I have done so with Joycelyn Schmid. Patient complained of some swallowing issues she wanted to discuss. I asked that if she needed to be seen sooner to please call back. Pt verbalized understanding.

## 2019-04-03 NOTE — Telephone Encounter (Signed)
IMPRESSION: 1. Lung-RADS 2, benign appearance or behavior. Continue annual screening with low-dose chest CT without contrast in 12 months. 2. Aortic atherosclerosis (ICD10-I70.0), coronary artery atherosclerosis and emphysema (ICD10-J43.9). 3. Left nephrolithiasis. 4. Esophageal air fluid level suggests dysmotility or gastroesophageal reflux  CT chest  1. Plaque build up in aorta and heart  2. Copd rec smoking cessation repeat CT chest in 1 year  3. Left kidney stones  4. GERD/reflex    F/u with PCP  Schedule appt to discuss

## 2019-04-04 ENCOUNTER — Encounter: Payer: Self-pay | Admitting: *Deleted

## 2019-04-23 ENCOUNTER — Other Ambulatory Visit: Payer: Self-pay | Admitting: Family

## 2019-04-23 DIAGNOSIS — I700 Atherosclerosis of aorta: Secondary | ICD-10-CM

## 2019-04-23 DIAGNOSIS — I10 Essential (primary) hypertension: Secondary | ICD-10-CM

## 2019-05-13 ENCOUNTER — Ambulatory Visit (INDEPENDENT_AMBULATORY_CARE_PROVIDER_SITE_OTHER): Payer: Medicare Other | Admitting: Family

## 2019-05-13 ENCOUNTER — Encounter: Payer: Self-pay | Admitting: Family

## 2019-05-13 VITALS — Ht 66.0 in | Wt 148.0 lb

## 2019-05-13 DIAGNOSIS — I10 Essential (primary) hypertension: Secondary | ICD-10-CM | POA: Diagnosis not present

## 2019-05-13 DIAGNOSIS — R1310 Dysphagia, unspecified: Secondary | ICD-10-CM | POA: Diagnosis not present

## 2019-05-13 DIAGNOSIS — R002 Palpitations: Secondary | ICD-10-CM | POA: Diagnosis not present

## 2019-05-13 DIAGNOSIS — I700 Atherosclerosis of aorta: Secondary | ICD-10-CM

## 2019-05-13 NOTE — Assessment & Plan Note (Signed)
Known from prior CT. Pending labs. Continue lipitor.

## 2019-05-13 NOTE — Assessment & Plan Note (Addendum)
Etiology uncertain at this time.  Certainly in setting of smoking, prompt evaluation is important.  Advised ENT consult. She declines. Will discuss at follow up.  NOTE: dysmotility also noted on CT Chest, will urge again patient to consider ENT consult again at follow.

## 2019-05-13 NOTE — Assessment & Plan Note (Addendum)
BP Readings from Last 3 Encounters:  07/27/18 122/80  07/13/18 131/85  07/03/18 (!) 145/85   Asked patient to monitor blood pressure and send readings from home.

## 2019-05-13 NOTE — Progress Notes (Addendum)
Verbal consent for services obtained from patient prior to services given to TELEPHONE visit:   Location of call:  provider at work patient at home  Names of all persons present for services: [name], NP Chief complaint:   Multiple complaints.   Describes feeling pulse on right side of neck and there have been 'spells' that may occur at random. States there is 'no pain.'  May last seconds to minutes. Last one over a week ago, had an episode which was 'brief.'  At times, associated with 'sweating' around brow, dizziness. Notices more often with standing, activity.  Feels better when lays prone position. No syncope, CP, sob, n, v, left arm pain, HA, vision changes.  Doesn't think anxiety playing role. Thinks may be from smoking.   04/2018 Dr Josefa Half. Holter showed NSR, short atrial runs.  Echo showwed LVEF 55%.   Has been on 'going for years. ' Drinks 2.5 cups of coffee/day.Drink 2-3 cups tea. Drinking well. Urine is pale.   Ct Chest 04/2019 discussed with patient.  Benign appearance. Punctate left renal calculus, esophagitis.   Patient has no flank pain, N, V.   Compliant with lipitor.   HTN- compliant with medication. Doesn't check at home.   CT angio- right ICA 75% stenosis, 50% proximal left ICA.  Carotid stenosis- Dr Lucky Cowboy; Right endartectomy 07/2018. Since then nerve pain from surgery.   Also complains of trouble swallowing, choking even since endartectomy last year. Trouble with salvia. No epigastric burning, regurgitation.   History, background, results pertinent:    A/P/next steps: Problem List Items Addressed This Visit      Cardiovascular and Mediastinum   Atherosclerosis of aorta ([employer])    Known from prior CT. Pending labs. Continue lipitor.       Relevant Orders   Lipid panel-future   Essential hypertension - Primary    BP Readings from Last 3 Encounters:  07/27/18 122/80  07/13/18 131/85  07/03/18 (!) 145/85   Asked patient to monitor blood pressure  and send readings from home.       Relevant Orders   Ambulatory referral to Cardiology   Lipid panel-future   Comprehensive metabolic panel-FUTURE     Digestive   Dysphagia    Etiology uncertain at this time.  Certainly in setting of smoking, prompt evaluation is important.  Advised ENT consult. She declines. Will discuss at follow up.  NOTE: dysmotility also noted on CT Chest, will urge again patient to consider ENT consult again at follow.         Other   Palpitations    Chronic, unchanged. Continues to have 'spell' with palpable pulse certainly is hard to assess over phone call.  She was adamant that she has not had any chest pain and this symptom is not painful.  The symptom that she has noticed for the past few years and is not changed. I shared my concern with her that this be cardiac in nature. ? Runs of SVT.   Reviewed notes from Dr Josefa Half , appeared normal holter monitor study in 2019. She would like to see someone else besides Dr Josefa Half.  Declines f/u with vascular as she does not want to return to vascular. Advised and she declines ER visit for acute evaluation. She was agreeable to urgent referral to cardiology, which I have placed urgent. Will follow.       Relevant Orders   Ambulatory referral to Cardiology       I spent 25 min  discussing plan of  care over the phone.

## 2019-05-13 NOTE — Assessment & Plan Note (Addendum)
Chronic, unchanged. Continues to have 'spell' with palpable pulse certainly is hard to assess over phone call.  She was adamant that she has not had any chest pain and this symptom is not painful.  The symptom that she has noticed for the past few years and is not changed. I shared my concern with her that this be cardiac in nature. ? Runs of SVT.   Reviewed notes from Dr Josefa Half , appeared normal holter monitor study in 2019. She would like to see someone else besides Dr Josefa Half.  Declines f/u with vascular as she does not want to return to vascular. Advised and she declines ER visit for acute evaluation. She was agreeable to urgent referral to cardiology, which I have placed urgent. Will follow.

## 2019-05-14 ENCOUNTER — Other Ambulatory Visit: Payer: Self-pay

## 2019-05-15 ENCOUNTER — Other Ambulatory Visit (INDEPENDENT_AMBULATORY_CARE_PROVIDER_SITE_OTHER): Payer: Medicare Other

## 2019-05-15 DIAGNOSIS — I10 Essential (primary) hypertension: Secondary | ICD-10-CM

## 2019-05-15 DIAGNOSIS — I700 Atherosclerosis of aorta: Secondary | ICD-10-CM | POA: Diagnosis not present

## 2019-05-15 LAB — COMPREHENSIVE METABOLIC PANEL
ALT: 19 U/L (ref 0–35)
AST: 21 U/L (ref 0–37)
Albumin: 4.5 g/dL (ref 3.5–5.2)
Alkaline Phosphatase: 74 U/L (ref 39–117)
BUN: 15 mg/dL (ref 6–23)
CO2: 30 mEq/L (ref 19–32)
Calcium: 9.6 mg/dL (ref 8.4–10.5)
Chloride: 103 mEq/L (ref 96–112)
Creatinine, Ser: 0.89 mg/dL (ref 0.40–1.20)
GFR: 62.53 mL/min (ref >60.00)
Glucose, Bld: 101 mg/dL — ABNORMAL HIGH (ref 70–99)
Potassium: 4.4 mEq/L (ref 3.5–5.1)
Sodium: 140 mEq/L (ref 135–145)
Total Bilirubin: 0.8 mg/dL (ref 0.2–1.2)
Total Protein: 7.2 g/dL (ref 6.0–8.3)

## 2019-05-15 LAB — LIPID PANEL
Cholesterol: 167 mg/dL (ref 0–200)
HDL: 58.1 mg/dL (ref >39.00)
LDL Cholesterol: 90 mg/dL (ref 0–99)
NonHDL: 108.49
Total CHOL/HDL Ratio: 3
Triglycerides: 93 mg/dL (ref 0.0–149.0)
VLDL: 18.6 mg/dL (ref 0.0–40.0)

## 2019-05-17 ENCOUNTER — Ambulatory Visit (INDEPENDENT_AMBULATORY_CARE_PROVIDER_SITE_OTHER): Payer: Medicare Other | Admitting: Cardiovascular Disease

## 2019-05-17 ENCOUNTER — Encounter: Payer: Self-pay | Admitting: Cardiovascular Disease

## 2019-05-17 ENCOUNTER — Other Ambulatory Visit: Payer: Self-pay

## 2019-05-17 ENCOUNTER — Ambulatory Visit (INDEPENDENT_AMBULATORY_CARE_PROVIDER_SITE_OTHER): Payer: Medicare Other

## 2019-05-17 VITALS — BP 130/80 | HR 74 | Ht 66.0 in | Wt 155.0 lb

## 2019-05-17 DIAGNOSIS — R002 Palpitations: Secondary | ICD-10-CM | POA: Diagnosis not present

## 2019-05-17 DIAGNOSIS — I350 Nonrheumatic aortic (valve) stenosis: Secondary | ICD-10-CM

## 2019-05-17 DIAGNOSIS — E785 Hyperlipidemia, unspecified: Secondary | ICD-10-CM

## 2019-05-17 DIAGNOSIS — I10 Essential (primary) hypertension: Secondary | ICD-10-CM | POA: Diagnosis not present

## 2019-05-17 DIAGNOSIS — I2510 Atherosclerotic heart disease of native coronary artery without angina pectoris: Secondary | ICD-10-CM

## 2019-05-17 DIAGNOSIS — Z720 Tobacco use: Secondary | ICD-10-CM

## 2019-05-17 NOTE — Progress Notes (Signed)
Cardiology Office Note   Date:  05/17/2019   ID:  Tracy, Bell 1951-02-20, MRN [id number]  PCP:  Burnard Hawthorne, FNP  Cardiologist:   Kathlyn Sacramento, MD   Chief Complaint  Patient presents with  . New Patient (Initial Visit)    HTN/Paplitations; Meds verbally reviewed with patient.      History of Present Illness: Tracy Bell is a 69 y.o. female who was referred by [name] for evaluation of palpitations and tachycardia. She has chronic medical conditions that include COPD, hyperlipidemia, aortic stenosis, essential hypertension, carotid disease status post right carotid endarterectomy in March 2020 and tobacco use. She was seen in the past by Dr. Saralyn Pilar.  Previous CT scan of the lungs did show evidence of coronary calcifications.  She had a stress echocardiogram in August 2017.  She exercised for 4 minutes and 36 seconds without chest pain or EKG changes.  Echocardiogram showed normal LV systolic function with mild aortic stenosis and no evidence of ischemia with stress.  She had palpitations in 2019.  A 72-hour monitor was done and showed sinus rhythm with frequent PACs and occasional brief atrial runs longest lasted 7 beats. Most recent echocardiogram in December 2019 showed normal LV systolic function, mild to moderate aortic stenosis with mean gradient of 13 mmHg and valve area of 1.16.  She reports intermittent episodes of palpitations and tachycardia.  These episodes can happen every 2 months or can happen twice in 1 week.  Typically, she feels sudden onset of fast heartbeats with pulsation in the right side of the neck.  This is usually associated with mild dizziness and sweating but she never had syncope during these episodes.  She feels more short of breath.  She denies any chest pain.  She continues to smoke between 1 to 2 packs/day.  Past Medical History:  Diagnosis Date  . Anxiety   . COPD (chronic obstructive pulmonary disease) ([place])   .  Cough   . Depression   . Dizziness   . Dyspnea   . Heart murmur   . Hyperlipidemia   . Hypertension   . Lightheadedness   . Personal history of tobacco use, presenting hazards to health 08/28/2015    Past Surgical History:  Procedure Laterality Date  . ABDOMINAL HYSTERECTOMY  1980   complete  . BREAST BIOPSY  1970   normal  . DILATION AND CURETTAGE OF UTERUS    . ENDARTERECTOMY Right 07/12/2018   Procedure: ENDARTERECTOMY CAROTID;  Surgeon: Algernon Huxley, MD;  Location: [facility] ORS;  Service: Vascular;  Laterality: Right;  . TUBAL LIGATION       Current Outpatient Medications  Medication Sig Dispense Refill  . amLODipine (NORVASC) 5 MG tablet TAKE 1 TABLET BY MOUTH EVERY DAY 90 tablet 3  . aspirin EC 81 MG tablet Take 1 tablet (81 mg total) by mouth daily. 30 tablet 6  . atorvastatin (LIPITOR) 10 MG tablet TAKE 1 TABLET BY MOUTH EVERY DAY 90 tablet 3  . cholecalciferol (VITAMIN D3) 25 MCG (1000 UT) tablet Take 1,000 Units by mouth daily.    Marland Kitchen lisinopril (PRINIVIL,ZESTRIL) 5 MG tablet Take 1 tablet (5 mg total) by mouth daily. 90 tablet 3  . Multiple Vitamins-Minerals (CENTRUM ADULTS) TABS Take 1 tablet by mouth daily. 30 tablet 6   No current facility-administered medications for this visit.    Allergies:   Patient has no known allergies.    Social History:  The patient  reports that she  has been smoking. She has a 120.00 pack-year smoking history. She has never used smokeless tobacco. She reports current alcohol use. She reports that she does not use drugs.   Family History:  The patient's family history includes Cancer in her father, mother, and sister; Lung cancer in her sister and sister.    ROS:  Please see the history of present illness.   Otherwise, review of systems are positive for none.   All other systems are reviewed and negative.    PHYSICAL EXAM: VS:  BP 130/80 (BP Location: Right Arm, Patient Position: Sitting, Cuff Size: Normal)   Pulse 74   Ht 5\' 6"  (1.676  m)   Wt 155 lb (70.3 kg)   SpO2 93%   BMI 25.02 kg/m  , BMI Body mass index is 25.02 kg/m. GEN: Well nourished, well developed, in no acute distress  HEENT: normal  Neck: no JVD, carotid bruits, or masses Cardiac: RRR; no  rubs, or gallops,no edema .  3 out of 6 crescendo decrescendo systolic murmur in the aortic area which is mid-to-late peaking with diminished S2.  The murmur radiates to bilateral carotid arteries. Respiratory:  clear to auscultation bilaterally, normal work of breathing GI: soft, nontender, nondistended, + BS MS: no deformity or atrophy  Skin: warm and dry, no rash Neuro:  Strength and sensation are intact Psych: euthymic mood, full affect   EKG:  EKG is ordered today. The ekg ordered today demonstrates sinus rhythm with no significant ST or T wave changes.   Recent Labs: 07/27/2018: Hemoglobin 15.2; Platelets 279.0 05/15/2019: ALT 19; BUN 15; Creatinine, Ser 0.89; Potassium 4.4; Sodium 140    Lipid Panel    Component Value Date/Time   CHOL 167 05/15/2019 1055   TRIG 93.0 05/15/2019 1055   HDL 58.10 05/15/2019 1055   CHOLHDL 3 05/15/2019 1055   VLDL 18.6 05/15/2019 1055   LDLCALC 90 05/15/2019 1055      Wt Readings from Last 3 Encounters:  05/17/19 155 lb (70.3 kg)  05/13/19 148 lb (67.1 kg)  04/02/19 152 lb 8 oz (69.2 kg)        PAD Screen 05/17/2019  Previous PAD dx? No  Previous surgical procedure? No  Pain with walking? Yes  Subsides with rest? Yes  Feet/toe relief with dangling? No  Painful, non-healing ulcers? No  Extremities discolored? No      ASSESSMENT AND PLAN:  1.  Palpitations: Her history is highly suggestive of paroxysmal supraventricular tachycardia.  I requested a 2-week outpatient monitor.  I will consider switching amlodipine to diltiazem or adding metoprolol upon follow-up.  2.  Coronary artery disease involving native coronary arteries: Previous CT scan of the lungs showed evidence of coronary calcifications.   Currently with no symptoms suggestive of angina.  Continue medical therapy.  She does take low-dose aspirin.  3.  Aortic stenosis: Her physical exam is suggestive of at least moderate aortic stenosis.  I requested a follow-up echocardiogram.  4.  Essential hypertension: Blood pressure is controlled on current medications.  5.  Hyperlipidemia: Currently on atorvastatin 10 mg daily.  Recent lipid profile showed an LDL of 90.  I think we should aim for an LDL below 70 and thus will consider increasing the dose of atorvastatin to 40 mg upon follow-up.  6.  Tobacco use: I discussed with her the importance of smoking cessation but she reports inability to quit.  7.  Carotid artery disease status post right carotid endarterectomy: Followed by vascular surgery  Disposition:   FU with me in 1 month  Signed,  Kathlyn Sacramento, MD  05/17/2019 2:58 PM    [place]

## 2019-05-17 NOTE — Patient Instructions (Signed)
Medication Instructions:  Your physician recommends that you continue on your current medications as directed. Please refer to the Current Medication list given to you today.  *If you need a refill on your cardiac medications before your next appointment, please call your pharmacy*  Lab Work: None ordered If you have labs (blood work) drawn today and your tests are completely normal, you will receive your results only by: Marland Kitchen MyChart Message (if you have MyChart) OR . A paper copy in the mail If you have any lab test that is abnormal or we need to change your treatment, we will call you to review the results.  Testing/Procedures: Your physician has requested that you have an echocardiogram. Echocardiography is a painless test that uses sound waves to create images of your heart. It provides your doctor with information about the size and shape of your heart and how well your heart's chambers and valves are working. This procedure takes approximately one hour. There are no restrictions for this procedure.  Your physician has recommended that you wear an zio monitor. Zio monitors are medical devices that record the heart's electrical activity. Doctors most often [place] these monitors to diagnose arrhythmias. Arrhythmias are problems with the speed or rhythm of the heartbeat. The monitor is a small, portable device. You can wear one while you do your normal daily activities. This is usually used to diagnose what is causing palpitations/syncope (passing out).    Follow-Up: At [facility], you and your health needs are our priority.  As part of our continuing mission to provide you with exceptional heart care, we have created designated Provider Care Teams.  These Care Teams include your primary Cardiologist (physician) and Advanced Practice Providers (APPs -  Physician Assistants and Nurse Practitioners) who all work together to provide you with the care you need, when you need it.  Your next  appointment:   After testing    The format for your next appointment:   In Person  Provider:    You may see  Dr. Fletcher Anon  or one of the following Advanced Practice Providers on your designated Care Team:    Murray Hodgkins, NP  Christell Faith, PA-C  [name], PA-C   Other Instructions N/A

## 2019-05-23 IMAGING — CT CT ANGIO NECK
3 of 7 series · 8 of 33 positions shown · IV contrast (iopamidol)
Comparison: Chest CT 03/21/2018

CLINICAL DATA: Right greater than left carotid artery stenosis on
ultrasound. Intermittent near syncopal episodes. Smoker.

EXAM:
CT ANGIOGRAPHY NECK
TECHNIQUE: Multidetector CT imaging of the neck was performed using the
standard protocol during bolus administration of intravenous
contrast. Multiplanar CT image reconstructions and MIPs were
obtained to evaluate the vascular anatomy. Carotid stenosis
measurements (when applicable) are obtained utilizing NASCET
criteria, using the distal internal carotid diameter as the
denominator.
CONTRAST:  75mL A7CS99-7NC IOPAMIDOL ([id number]) INJECTION 76%

[Series 7: cta neck cta neck (person_name) · axial · 0.46mm/px · z∈[-760,-666]mm · 2 of 142 slices shown]
[im 48/142  soft-tissue]
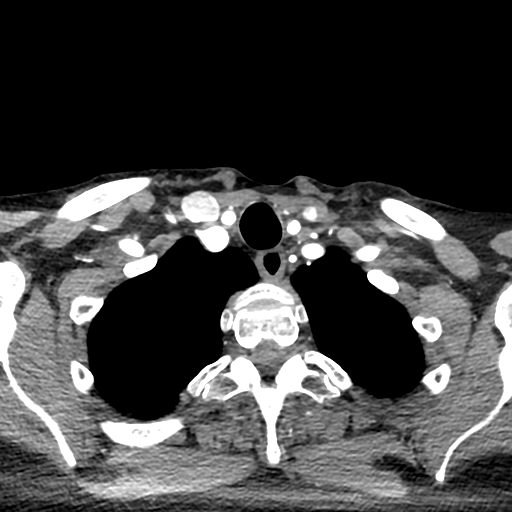
[im 95/142  soft-tissue]
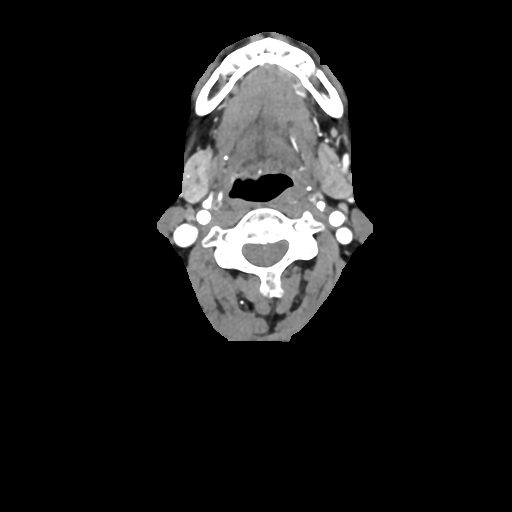

[Series 8: ax thin mips cta neck · axial · 0.46mm/px · z∈[-807,-619]mm · 5 of 284 slices shown]
[im 48/284  soft-tissue]
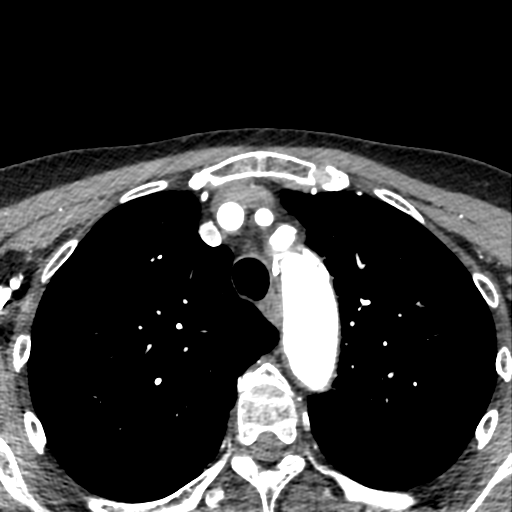
[im 95/284  bone]
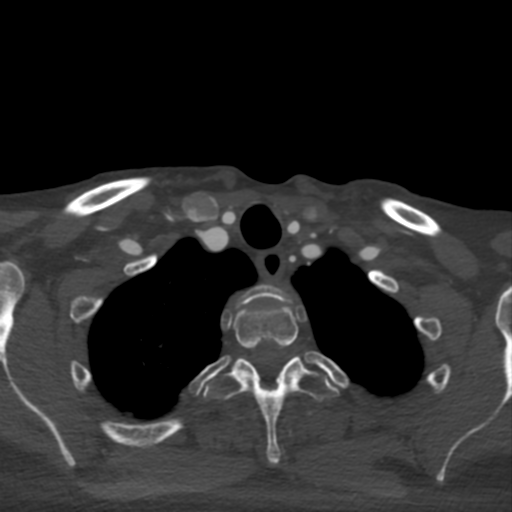
[im 142/284  soft-tissue]
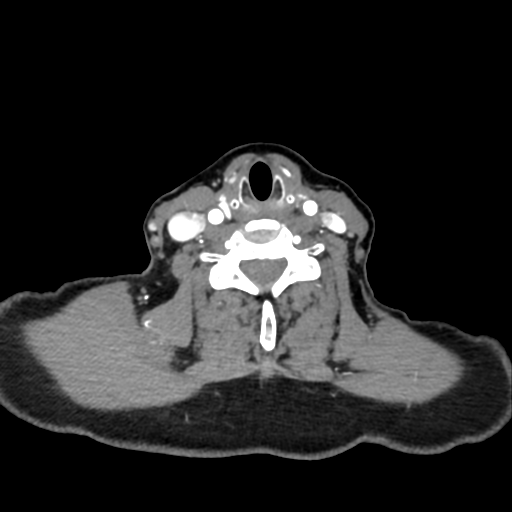
[im 189/284  bone]
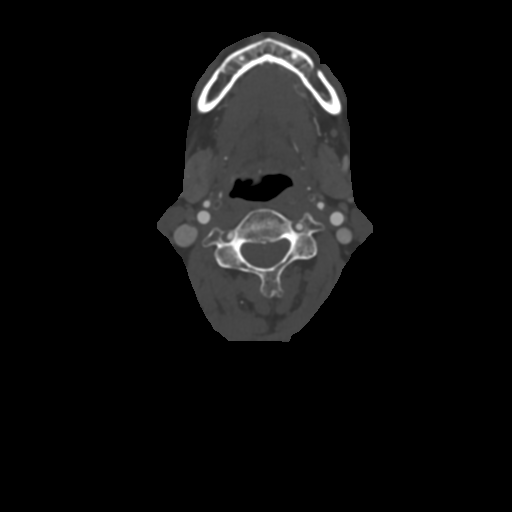
[im 236/284  soft-tissue]
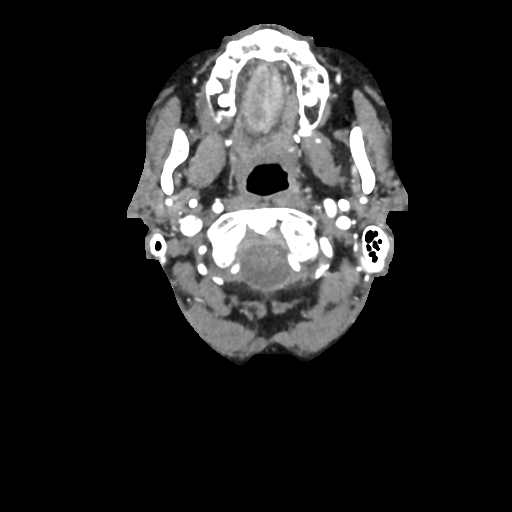

[Series 12: sag thin mips cta neck · sagittal · 0.46mm/px · 1 of 236 slices shown]
[im 118/236  soft-tissue]
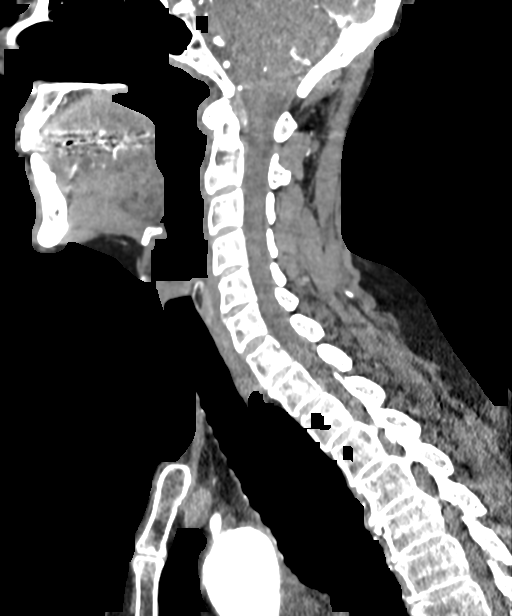

[8 of 33 positions shown; findings below may reference images not displayed]

FINDINGS: Aortic arch: Standard 3 vessel aortic arch with moderate
atherosclerotic plaque. Partially visualized aneurysmal dilatation
of the ascending aorta to 4.3 cm diameter, also present on the prior
chest CT. Nonstenotic plaque in the brachiocephalic and subclavian
arteries.

Right carotid system: Patent with extensive, predominantly calcified
plaque at the carotid bifurcation resulting in 75% proximal ICA
stenosis.

Left carotid system: Patent with less and 50% stenosis of the common
carotid artery origin. Moderate volume, predominantly calcified
plaque in the carotid bulb. A thin web like filling defect in the
ICA 1.5 cm distal to its origin results in 50% stenosis.

Vertebral arteries: Patent with the left being mildly dominant. Mild
left V1 segment atherosclerosis without stenosis.

Skeleton: No acute osseous abnormality or suspicious osseous lesion.

Other neck: No evidence of acute abnormality or mass.

Upper chest: Mild centrilobular emphysema.
IMPRESSION: 1. Extensive proximal right ICA atherosclerotic plaque resulting in
75% stenosis.
2. 50% proximal left ICA stenosis due to a small web.
3. Widely patent vertebral arteries.
4. Previously reported 4.3 cm ascending aortic aneurysm.

Aortic Atherosclerosis (T9OHB-X8A.A).

Emphysema (T9OHB-BB1.8).

Aortic aneurysm NOS (T9OHB-5CY.4).

## 2019-05-27 ENCOUNTER — Other Ambulatory Visit: Payer: Self-pay | Admitting: Family

## 2019-05-27 DIAGNOSIS — I10 Essential (primary) hypertension: Secondary | ICD-10-CM

## 2019-06-05 ENCOUNTER — Ambulatory Visit (INDEPENDENT_AMBULATORY_CARE_PROVIDER_SITE_OTHER): Payer: Medicare Other

## 2019-06-05 ENCOUNTER — Other Ambulatory Visit: Payer: Self-pay

## 2019-06-05 DIAGNOSIS — I350 Nonrheumatic aortic (valve) stenosis: Secondary | ICD-10-CM | POA: Diagnosis not present

## 2019-06-08 DIAGNOSIS — I350 Nonrheumatic aortic (valve) stenosis: Secondary | ICD-10-CM | POA: Diagnosis not present

## 2019-06-16 NOTE — Progress Notes (Signed)
Office Visit    Patient Name: Tracy Bell Date of Encounter: 06/19/2019  Primary Care Provider:  Burnard Hawthorne, FNP Primary Cardiologist:  Dr. Fletcher Anon  Chief Complaint    69 year old female with history of palpitations, tachycardia, COPD, hyperlipidemia, aortic stenosis, hypertension, HLD, carotid disease s/p right carotid endarterectomy (07/2018), tobacco use, and seen today for 1 month follow-up s/p echo and Zio.  Past Medical History    Past Medical History:  Diagnosis Date  . Anxiety   . COPD (chronic obstructive pulmonary disease) ([place])   . Cough   . Depression   . Dizziness   . Dyspnea   . Heart murmur   . Hyperlipidemia   . Hypertension   . Lightheadedness   . Personal history of tobacco use, presenting hazards to health 08/28/2015   Past Surgical History:  Procedure Laterality Date  . ABDOMINAL HYSTERECTOMY  1980   complete  . BREAST BIOPSY  1970   normal  . DILATION AND CURETTAGE OF UTERUS    . ENDARTERECTOMY Right 07/12/2018   Procedure: ENDARTERECTOMY CAROTID;  Surgeon: Algernon Huxley, MD;  Location: [facility] ORS;  Service: Vascular;  Laterality: Right;  . TUBAL LIGATION      Allergies  No Known Allergies  History of Present Illness    69 yo female with PMH as above. She was seen in the past by Dr. Saralyn Pilar. Previous CT scan showed evidence of CAC. She underwent a stress echo in 12/2015, during which she exercised for 4 minutes and 36 seconds without CP or EKG changes. Echo showed nl LVSF with mild AS and no evidence of ischemia. She reported palpitations in 2019 with 72 hour monitor performed and showing NSR with frequent PACs with occasional brief atrial runs, the longest of which lasted 7 beats. 04/2018 echo showed nl LVSF, mild to moderate AS with mean gradient 75mHg and valve area 1.16. At her last outpatient visit 05/17/19, she reported intermittent palpitations and tachycardia with episodes that occurred every two months to twice in one week. She  reported feeling the onset of faster heartbeat with pulsation on the right side of her neck. Associated sx included mild dizziness, diaphoresis, and mild SOB. She continued to smoke between 1-2 pks of cigarettes daily. Given her sx, recommendations were for 2 week outpatient monitor. A follow-up echo was also ordered. Zio results showed 100 SVT runs, with the fastest lasting 1 min 26 seconds with max rate of 197bpm and the longest 50 min and 54 seconds with average rate of 144 bm. The MD notification criteria for SVT was met. Also noted was second degree AVB - Mobitz I (Wenckebach). Updated echo as below showed EF 60-65%, G2DD, mild TR, mild to moderate AS with mean gradient 129mg and valve area 1.4cm2. Mildly elevated PASP was also noted with RVSP 32.9m59m.  Today, she continues to note episodes of racing heart rate and palpitations.  These are occasionally associated with symptoms of presyncope and "spells" of brief sweating. She also reports occasional right-sided pulsation associated with her episodes. No recent syncope or falls.  No recent chest pain, shortness of breath, or dyspnea on exertion.  No reported orthopnea, abdominal distention, PND.  No signs or symptoms consistent with bleeding.  She reports occasional nausea if something is pressing against the front of her throat.  She reports medication compliance.  She is still smoking with cessation advised.  She reports drinking maximum 2 bottles of [place] liquor a year.  She occasionally adds a small  amount of salt to her food.  Zio results reviewed in detail with patient indicating her understanding and request for SVT information on AVS.  Echo results reviewed in detail.  Also discussed with her elevated blood pressure today.  Per patient, she prefers to avoid any changes in her medication, referrals, or procedures at this time and to reduce cost and insurance claims.  Home Medications    Prior to Admission medications   Medication Sig Start Date  End Date Taking? Authorizing Provider  amLODipine (NORVASC) 5 MG tablet TAKE 1 TABLET BY MOUTH EVERY DAY 04/23/19   Burnard Hawthorne, FNP  aspirin EC 81 MG tablet Take 1 tablet (81 mg total) by mouth daily. 03/14/14   Jackolyn Confer, MD  atorvastatin (LIPITOR) 10 MG tablet TAKE 1 TABLET BY MOUTH EVERY DAY 04/23/19   Burnard Hawthorne, FNP  cholecalciferol (VITAMIN D3) 25 MCG (1000 UT) tablet Take 1,000 Units by mouth daily.    [provider]  lisinopril (ZESTRIL) 5 MG tablet TAKE 1 TABLET BY MOUTH EVERY DAY 05/28/19   Burnard Hawthorne, FNP  Multiple Vitamins-Minerals (CENTRUM ADULTS) TABS Take 1 tablet by mouth daily. 03/14/14   Jackolyn Confer, MD    Review of Systems    She denies chest pain, dyspnea, pnd, orthopnea, n, v, syncope, edema, weight gain, or early satiety.  She reports palpitations, racing heart rate, dizziness, episodes of sweating, and near syncope.  She reports occasional right-sided pulsations in her neck.  She also occasionally will feel nauseated if something is pressing against the front of her throat.  All other systems reviewed and are otherwise negative except as noted above.  Physical Exam    VS:  BP (!) 170/82 (BP Location: Left Arm, Patient Position: Sitting, Cuff Size: Normal)   Pulse 60   Ht '5\' 6"'  (1.676 m)   Wt 159 lb 8 oz (72.3 kg)   SpO2 90%   BMI 25.74 kg/m  , BMI Body mass index is 25.74 kg/m. GEN: Well nourished, well developed, in no acute distress. HEENT: normal. Neck: Supple, no JVD, carotid bruits, or masses. Cardiac: regular and slightly bradycardic, 3/6 systolic murmur in aortic area and radiating to the carotids bilaterally, No rubs, or gallops. No clubbing, cyanosis, moderate bilateral edema.  Radials/DP/PT 2+ and equal bilaterally.  Respiratory:  Respirations regular and unlabored, clear to auscultation bilaterally. GI: Soft, nontender, nondistended, BS + x 4. MS: no deformity or atrophy. Skin: warm and dry, no  rash. Neuro:  Strength and sensation are intact. Psych: Normal affect.  Accessory Clinical Findings    ECG personally reviewed by me today -sinus rhythm, 60 bpm, baseline wander - no acute changes.  Zio Predominantly NSR, HR 25-197 with average HR 65bpm. 100 SVT runs occurred. The run with the fastest interval lasted 1 minute and 26 seconds with maximum rate of 197 bpm, the longest lasting 50 min 54 seconds with an average rate of 144bpm. Second degree AVB Mobitz (Wenckebach) was present. SVT was detected within +- 45 seconds of symptomatic patient events. Isolated SVE were rare (<1.0%), SVE Couplets were rare (<1.0%), and SVE Triplets were rare (<1.0%). Isolated VE were rare (<1.0%), VE Couplets were rare (<1.0%), and no VE Triplets were present. MD notification for SVT met.   Echo 06/05/19 1. Left ventricular ejection fraction, by visual estimation, is 60 to  65%. The left ventricle has normal function. There is borderline left  ventricular hypertrophy.  2. Left ventricular diastolic parameters are consistent with  Grade II  diastolic dysfunction (pseudonormalization).  3. The left ventricle has no regional wall motion abnormalities.  4. Global right ventricle has normal systolic function.The right  ventricular size is normal. Right vetricular wall thickness was not  assessed.  5. Left atrial size was normal.  6. Right atrial size was normal.  7. The mitral valve is normal in structure. No evidence of mitral valve  regurgitation.  8. The tricuspid valve is normal in structure.  9. The tricuspid valve is normal in structure. Tricuspid valve  regurgitation is mild.  10. The aortic valve is abnormal. Aortic valve regurgitation is not  visualized. Mild to moderate aortic valve stenosis.  11. Peak AV gradient 68mHg, MG 135mg, DVI 0.45, with LVOT diameter of  2cm, AVA 1.4cm2.  12. The pulmonic valve was not well visualized. Pulmonic valve  regurgitation is not visualized.  13.  Mildly elevated pulmonary artery systolic pressure.  14. The inferior vena cava is normal in size with greater than 50%  respiratory variability, suggesting right atrial pressure of 3 mmHg.   Na 140, K 4.4, Cr 0.89, BUN 15, AST 21, ALT 19, total cholesterol 167, LDL 90.0 03/23/2018 TSH 3.05 Hemoglobin A1C 5.8  Filed Weights   06/19/19 1114  Weight: 159 lb 8 oz (72.3 kg)     Assessment & Plan    SVT --Reviewed Zio results as outlined/copied pasted above.  Consider SVT is contributing to her symptoms of dizziness and brief sweating spells with pulsating neck, At this time, patient preference is to avoid any medication changes, including Cardizem/BB.  She recently refilled her medications and therefore wants to avoid any changes. She also indicated a preference to avoid adding or making medication changes. Defer plan to discontinue amlodipine and trial a start of Cardizem given her SVT and reassess at RTC.  Preference for Cardizem over BB given lung dz as below and current lower HR with preference for BP control over heart rate control at this time.  Given her preference to avoid new or changing medications, discussed ablation as well / EP referral with patient preference to defer. Continue current medications for now with increase of amlodipine to 1075maily due to poorly controlled BP as below. Risk factor modification recommended, including sleep study and deferred by patient.   Coronary artery disease involving the native coronary arteries Aortic atherosclerosis --Previous CT scan of the lungs showed aortic atherosclerosis and coronary atherosclerosis.  She does have significant risk factors for coronary artery disease, including current smoker.  No current angina or concerning sx. No current plan for further ischemic work-up.  Continue medical therapy. Risk factor modification discussed / lifestyle changes. Also discussed increasing her current dose of statin with patient preference to defer.  Continue ASA.   AS, mild to moderate --Aortic valve with mild moderate stenosis as updated echo as above.  Discussed that this may contribute to her dizziness/presyncope.  Continue to monitor with periodic echo and instructed her to call if any signs or symptoms of worsening valvular disease emerge (we reviewed these together).   Essential hypertension, poorly controlled --Blood pressure poorly controlled today at 170/82.  Increased amlodipine to 10 mg daily for blood pressure control, and as patient does not wish to start Cardizem for SVT until she finishes her current amlodipine bottle. Patient preference is to defer any changes/ increase to dose of lisinopril for now given change to her amlodipine today (increased dose). Low-salt diet discussed, as well as effects of alcohol and nicotine on BP.  Discussed recommendation for sleep study as well, given this can influence BP over time. Reassess transition from amlodipine to Cardizem (given her SVT) at RTC. Continue increased amlodipine and current dose lisinopril. Future considerations to include increasing ACE if patient agreeable.  Ascending aortic aneurysm, previously noted on CT 05/17/18 --Previous 1/16 CT scan noted 4.3 cm ascending aortic aneurysm, not noted on most recent echo or 04/2019 CT.  Recommend heart rate and blood pressure control. At RTC, revisit dose of statin.   Carotid artery disease s/p R carotid endarterectomy --Followed by vascular surgery.  No bruit heard on exam.  Smoking / alcohol cessation, BP / heart rate control.  Discussed increased dose of statin, which she prefers to defer.  Risk factor modification recommended.  Possible sleep apnea Mobitz type I, Wenckebach --ZIO as above and reviewed in detail with patient showed early AM second-degree heart block/Mobitz type I. She does not wish to see EP for ablation, as we discussed that Cardizem may further slow her rate. Reccommended work-up for sleep apnea given patient report  of poor sleep, elevated blood pressure, and early AM / overnight Wenckebach.  Patient preference is to defer.  Reassess sleep study referral at RTC.     Current smoker (1/2-2 packs / day) Alcohol use, tobacco use --Cessation discussed at length today.    Nodule of the left lung --Per 04/02/2019 CT, lung RADS 2 with benign appearance of behavior.  Discussed recommendation on CT scan report to continue annual screening with low-dose chest CT without contrast every 12 months.  Disposition:  Will increase amlodipine to 10 mg daily.  Reassess blood pressure return to clinic.  Consider increasing ACE/lisinopril at that time, if needed.  Also consider transition to Cardizem given her SVT and with consideration of early a.m. Mobitz type I.  At RTC, revisit recommendation for pulmonology referral/sleep study.  Continue to emphasize smoking and alcohol cessation.   Total time spent with patient today 45  minutes. This includes reviewing records, evaluating the patient, and coordinating care. Face-to-face time >50%.   Arvil Chaco, PA-C 06/19/2019, 2:04 PM

## 2019-06-19 ENCOUNTER — Ambulatory Visit (INDEPENDENT_AMBULATORY_CARE_PROVIDER_SITE_OTHER): Payer: Medicare Other | Admitting: Physician Assistant

## 2019-06-19 ENCOUNTER — Other Ambulatory Visit: Payer: Self-pay

## 2019-06-19 ENCOUNTER — Encounter: Payer: Self-pay | Admitting: Physician Assistant

## 2019-06-19 VITALS — BP 170/82 | HR 60 | Ht 66.0 in | Wt 159.5 lb

## 2019-06-19 DIAGNOSIS — I712 Thoracic aortic aneurysm, without rupture: Secondary | ICD-10-CM

## 2019-06-19 DIAGNOSIS — Z9889 Other specified postprocedural states: Secondary | ICD-10-CM

## 2019-06-19 DIAGNOSIS — J449 Chronic obstructive pulmonary disease, unspecified: Secondary | ICD-10-CM

## 2019-06-19 DIAGNOSIS — I10 Essential (primary) hypertension: Secondary | ICD-10-CM

## 2019-06-19 DIAGNOSIS — R911 Solitary pulmonary nodule: Secondary | ICD-10-CM

## 2019-06-19 DIAGNOSIS — R002 Palpitations: Secondary | ICD-10-CM | POA: Diagnosis not present

## 2019-06-19 DIAGNOSIS — Z5320 Procedure and treatment not carried out because of patient's decision for unspecified reasons: Secondary | ICD-10-CM

## 2019-06-19 DIAGNOSIS — F17200 Nicotine dependence, unspecified, uncomplicated: Secondary | ICD-10-CM

## 2019-06-19 DIAGNOSIS — I272 Pulmonary hypertension, unspecified: Secondary | ICD-10-CM

## 2019-06-19 DIAGNOSIS — R42 Dizziness and giddiness: Secondary | ICD-10-CM | POA: Diagnosis not present

## 2019-06-19 DIAGNOSIS — I2510 Atherosclerotic heart disease of native coronary artery without angina pectoris: Secondary | ICD-10-CM

## 2019-06-19 DIAGNOSIS — I350 Nonrheumatic aortic (valve) stenosis: Secondary | ICD-10-CM

## 2019-06-19 DIAGNOSIS — I441 Atrioventricular block, second degree: Secondary | ICD-10-CM

## 2019-06-19 DIAGNOSIS — I471 Supraventricular tachycardia: Secondary | ICD-10-CM

## 2019-06-19 DIAGNOSIS — R000 Tachycardia, unspecified: Secondary | ICD-10-CM

## 2019-06-19 DIAGNOSIS — I7121 Aneurysm of the ascending aorta, without rupture: Secondary | ICD-10-CM

## 2019-06-19 DIAGNOSIS — E785 Hyperlipidemia, unspecified: Secondary | ICD-10-CM

## 2019-06-19 DIAGNOSIS — I6523 Occlusion and stenosis of bilateral carotid arteries: Secondary | ICD-10-CM

## 2019-06-19 MED ORDER — DILTIAZEM HCL ER COATED BEADS 120 MG PO CP24: 120.0000 mg | ORAL_CAPSULE | Freq: Every day | ORAL | 3 refills | Status: DC

## 2019-06-19 NOTE — Patient Instructions (Addendum)
Medication Instructions:  - Your physician has recommended you make the following change in your medication:   1) Increase norvasc (amlodipine) 5 mg- take 2 tablets (10 mg)  by mouth once daily until you run out, then  2) Start cardizem (diltiazem) 120 mg- take 1 capsule (120 mg) by mouth once daily  *If you need a refill on your cardiac medications before your next appointment, please call your pharmacy*  Lab Work: - none ordered  If you have labs (blood work) drawn today and your tests are completely normal, you will receive your results only by: Marland Kitchen MyChart Message (if you have MyChart) OR . A paper copy in the mail If you have any lab test that is abnormal or we need to change your treatment, we will call you to review the results.  Testing/Procedures: - none order  Follow-Up: At [facility], you and your health needs are our priority.  As part of our continuing mission to provide you with exceptional heart care, we have created designated Provider Care Teams.  These Care Teams include your primary Cardiologist (physician) and Advanced Practice Providers (APPs -  Physician Assistants and Nurse Practitioners) who all work together to provide you with the care you need, when you need it.  Your next appointment:   3 week(s)  The format for your next appointment:   In Person  Provider:    You may see Kathlyn Sacramento, MD or one of the following Advanced Practice Providers on your designated Care Team:    Murray Hodgkins, NP  Christell Faith, PA-C  [name], PA-C   Other Instructions - n/a    Supraventricular Tachycardia, Adult Supraventricular tachycardia (SVT) is a kind of abnormal heartbeat. It makes your heart beat very fast and then beat at a normal speed. A normal resting heartbeat is 60-100 times a minute. This condition can make your heart beat more than 150 times a minute. Times of having a fast heartbeat (episodes) can be scary, but they are usually not  dangerous. In some cases, they may lead to heart failure if:  They happen many times per day.  Last longer than a few seconds. What are the causes?  A normal heartbeat starts when an area called the sinoatrial node sends out an electrical signal. In SVT, other areas of the heart send out signals that get in the way of the signal from the sinoatrial node. What increases the risk? You are more likely to develop this condition if you are:  20-58 years old.  A woman. The following factors may make you more likely to develop this condition:  Stress.  Tiredness.  Smoking.  Stimulant drugs, such as cocaine and methamphetamine.  Alcohol.  Caffeine.  Pregnancy.  Feeling worried or nervous (anxiety). What are the signs or symptoms?  A pounding heart.  A feeling that your heart is skipping beats (palpitations).  Weakness.  Trouble getting enough air.  Pain or tightness in your chest.  Feeling like you are going to pass out (faint).  Feeling worried or nervous.  Dizziness.  Sweating.  Feeling sick to your stomach (nausea).  Passing out.  Tiredness. Sometimes, there are no symptoms. How is this treated?  Vagal nerve stimulation. Ways to do this include: ? Holding your breath and pushing, as though you are pooping (having a bowel movement). ? Massaging an area on one side of your neck. Do not try this yourself. Only a doctor should do this. If done the wrong way, it can  lead to a stroke. ? Bending forward with your head between your legs. ? Coughing while bending forward with your head between your legs. ? Closing your eyes and massaging your eyeballs. Ask a doctor how to do this.  Medicines that prevent attacks.  Medicine to stop an attack given through an IV tube at the hospital.  A small electric shock (cardioversion) that stops an attack.  Radiofrequency ablation. In this procedure, a small, thin tube (catheter) is used to send energy to the area that is  causing the rapid heartbeats. If you do not have symptoms, you may not need treatment. Follow these instructions at home: Stress  Avoid things that make you feel stressed.  To deal with stress, try: ? Doing yoga or meditation, or being out in nature. ? Listening to relaxing music. ? Doing deep breathing. ? Taking steps to be healthy, such as getting lots of sleep, exercising, and eating a balanced diet. ? Talking with a mental health doctor. Lifestyle   Try to get at least 7 hours of sleep each night.  Do not use any products that contain nicotine or tobacco, such as cigarettes, e-cigarettes, and chewing tobacco. If you need help quitting, ask your doctor.  Be aware of how alcohol affects you. ? If alcohol gives you a fast heartbeat, do not drink alcohol. ? If alcohol does not seem to give you a fast heartbeat, limit alcohol use to no more than 1 drink a day for women who are not pregnant, and 2 drinks a day for men. In the [place] one drink is one of these:  12 oz of beer (355 mL).  5 oz of wine (148 mL).  1 oz of hard liquor (44 mL).  Be aware of how caffeine affects you. ? If caffeine gives you a fast heartbeat, do not eat, drink, or use anything with caffeine in it. ? If caffeine does not seem to give you a fast heartbeat, limit how much caffeine you eat, drink, or use.  Do not use stimulant drugs. If you need help quitting, ask your doctor. General instructions  Stay at a healthy weight.  Exercise regularly. Ask your doctor about good activities for you. Try one or a mixture of these: ? 150 minutes a week of gentle exercise, like walking or yoga. ? 75 minutes a week of exercise that is very active, like running or swimming.  Do vagus nerve treatments to slow down your heartbeat as told by your doctor.  Take over-the-counter and prescription medicines only as told by your doctor.  Keep all follow-up visits as told by your doctor. This is important. Contact a doctor  if:  You have a fast heartbeat more often.  Times of having a fast heartbeat last longer than before.  Home treatments to slow down your heartbeat do not help.  You have new symptoms. Get help right away if:  You have chest pain.  Your symptoms get worse.  You have trouble breathing.  Your heart beats very fast for more than 20 minutes.  You pass out. These symptoms may be an emergency. Do not wait to see if the symptoms will go away. Get medical help right away. Call your local emergency services (911 in the [place] Do not drive yourself to the hospital. Summary  SVT is a type of abnormal heart beat.  This condition can make your heart beat more than 150 times a minute.  Treatment depends on how often the condition happens and your symptoms.  This information is not intended to replace advice given to you by your health care provider. Make sure you discuss any questions you have with your health care provider. Document Revised: 03/06/2018 Document Reviewed: 03/06/2018 Elsevier Patient Education  2020 [employer].

## 2019-06-20 ENCOUNTER — Telehealth: Payer: Self-pay | Admitting: Cardiovascular Disease

## 2019-06-20 NOTE — Telephone Encounter (Signed)
Attempted to schedule 3 week fu per checkout 06-19-19   Patient will call back when she is in front of schedule.

## 2019-06-21 ENCOUNTER — Encounter: Payer: Self-pay | Admitting: Family

## 2019-06-21 DIAGNOSIS — I471 Supraventricular tachycardia: Secondary | ICD-10-CM | POA: Insufficient documentation

## 2019-06-21 NOTE — Progress Notes (Signed)
We also discussed ablation, since she does not like medications, but she is not amenable to EP referral at this time.

## 2019-06-21 NOTE — Progress Notes (Signed)
She does not wish to start diltiazem at this time - she is resistant to changing her medications. At checkout, she requested diltiazem be sent to the pharmacy for when she finishes her amlodipine (just filled the amlodipine). I can call her today and let her know we will just fill the diltiazem she runs out of the amlodipine to help reduce confusion if needed. She let me know she is unlikely to make the switch even after running out of her amlodipine but will think over it.

## 2019-07-07 NOTE — Progress Notes (Signed)
Office Visit    Patient Name: Tracy Bell Date of Encounter: 07/09/2019  Primary Care Provider:  Burnard Hawthorne, FNP Primary Cardiologist:  No primary care provider on file.  Chief Complaint    Chief Complaint  Patient presents with  . office visit    Pt states "having werid spells not feeling like herself" Meds verbally reviewed w/ pt.   69 year old female with history of SVT,  Mobitz I (Wenckebach), COPD, hyperlipidemia, aortic stenosis, hypertension, HLD, carotid disease s/p right carotid endarterectomy (07/2018), tobacco use (1-2 packs daily), and seen today for 3 week follow-up.  Past Medical History    Past Medical History:  Diagnosis Date  . Anxiety   . COPD (chronic obstructive pulmonary disease) ([place])   . Cough   . Depression   . Dizziness   . Dyspnea   . Heart murmur   . Hyperlipidemia   . Hypertension   . Lightheadedness   . Personal history of tobacco use, presenting hazards to health 08/28/2015   Past Surgical History:  Procedure Laterality Date  . ABDOMINAL HYSTERECTOMY  1980   complete  . BREAST BIOPSY  1970   normal  . DILATION AND CURETTAGE OF UTERUS    . ENDARTERECTOMY Right 07/12/2018   Procedure: ENDARTERECTOMY CAROTID;  Surgeon: Algernon Huxley, MD;  Location: [facility] ORS;  Service: Vascular;  Laterality: Right;  . TUBAL LIGATION      Allergies  No Known Allergies  History of Present Illness    Tracy Bell is a 69 y.o. female with PMH as above.  She was seen in the past by Dr. Saralyn Pilar. Previous CT scan showed evidence of CAC. She underwent a stress echo in 12/2015, during which she exercised for 4 minutes and 36 seconds without CP or EKG changes. Echo showed nl LVSF with mild AS and no evidence of ischemia. She reported palpitations in 2019 with 72 hour monitor performed and showing NSR with frequent PACs with occasional brief atrial runs, the longest of which lasted 7 beats. 04/2018 echo showed nl LVSF, mild to moderate AS with mean  gradient 82mHg and valve area 1.16. At her outpatient visit 05/17/19, she reported intermittent palpitations and tachycardia with episodes that occurred every two months to twice in one week. She reported feeling the onset of faster heartbeat with pulsation on the right side of her neck. Associated sx included mild dizziness, diaphoresis, and mild SOB. She continued to smoke between 1-2 pks of cigarettes daily. Given her sx, recommendations were for 2 week outpatient monitor. A follow-up echo was also ordered. Zio results showed 100 SVT runs, with the fastest lasting 1 min 26 seconds with max rate of 197bpm and the longest 50 min and 54 seconds with average rate of 144 bm. The MD notification criteria for SVT was met. Also noted was second degree AVB - Mobitz I (Wenckebach). Updated echo as below showed EF 60-65%, G2DD, mild TR, mild to moderate AS with mean gradient 181mg and valve area 1.4cm2. Mildly elevated PASP was also noted with RVSP 32.49m62m.  When last seen in clinic, she noted ongoing racing HR and palpitations with intermittent presyncope and diaphoresis. She noted R sided neck pulsation. She also felt occasional nausea or as if something was pressing on the front of her throat. She was still smoking. She occasionally added salt to her food. Zio was reviewed and showed SVT with recommendation to switch her amlodipine to diltiazem but deferred. BB was thought less ideal 2/2 pulmonary  dz. Possible EP referral was discussed, given her desire to avoid medications. Her BP was also noted to be elevated; however, she preferred to avoid changes to her medications due to cost and insurance. Thus, amlodipine was increased to 36m daily. She was agreeable to transition to diltiazem once out of her amlodipine which was just refilled.  It was noted that increased statin and ACE dose could be considered at RTC. As Mobitz I was also noted on her Zio in the early hours of the morning, recommendation was for sleep  study but deferred. Smoking cessation was also advised.   Since last seen in clinic, she has increased her amlodipine to 10 mg daily with improvement in her pressure today to 126/80.  She continues to note right side neck pulsation, as well as spells consistent with that of her SVT.  She occasionally feels presyncope / SOB / racing HR / high chest/neck discomfort at this time of her SVT. No syncope.  No recent falls.  She does report increased stress with the ongoing coronavirus 19 pandemic.Discussed her Zio results again in detail with recommendation for transition from amlodipine to diltiazem for SVT with patient agreeable to transition before running out of her medicine.  Information was requested on SVT and again provided in her AVS.  Given the start of low dose of diltiazem, we discussed recommendation she monitor her BP closely to ensure that it remains controlled with this medication transition. She thinks her husband had an old blood pressure cuff that she can start to use with recommendation for systolic BP under 1892and DBP under 80. We discussed her salt and caffeine intake. She does drink some caffeine but only in the AM (son bought her a [place]).  She will start to monitor if her SVT sx to see if they correspond with her caffeine intake. She is not drinking alcohol. She does report decreased motivation with discussion of possible sleep study but patient preference still to defer at this time.  She requests a pulmonology referral, however, given her ongoing smoking and known COPD. She denies any dyspnea on exertion but does report report bilateral lower extremity weakness and occasional calf cramping with cold feet. We discussed that this could be due to PVD, given her 1+ pedal pulses today on exam. She will think about discussing further workup with imaging +/- angiography if indicated.  For now, she wishes to defer further workup but agreeable to escalate her statin medication. We discussed the  recommendation to escalate to Lipitor 819m however, she still has Lipitor 10 mg left over from her current prescription, and she prefers to only escalate Lipitor to 10 mg twice daily for now and will let usKoreanow when she is out of the prescription and can escalate to Lipitor 8087mt that time, at which time she will also be agreeable to update lipid and liver function. She is still smoking and states that she has tried Wellbutrin 300 mg in the past without success. We discussed that smoking cessation will also help with her vascular sx. She reports that she currently has no desire to quit but will continue to think about it. No orthopnea, LEE, abdominal distention, or early satiety. No s/sx consistent with bleeding.  3/6 systolic murmur on exam today discussed with patient reassurance provided given most recent echo results.   Home Medications    Prior to Admission medications   Medication Sig Start Date End Date Taking? Authorizing Provider  aspirin EC 81 MG tablet  Take 1 tablet (81 mg total) by mouth daily. 03/14/14   Jackolyn Confer, MD  atorvastatin (LIPITOR) 10 MG tablet TAKE 1 TABLET BY MOUTH EVERY DAY 04/23/19   Burnard Hawthorne, FNP  cholecalciferol (VITAMIN D3) 25 MCG (1000 UT) tablet Take 1,000 Units by mouth daily.    [provider]  diltiazem (CARDIZEM CD) 120 MG 24 hr capsule Take 1 capsule (120 mg total) by mouth daily. 06/19/19 09/17/19  [name] D, PA-C  lisinopril (ZESTRIL) 5 MG tablet TAKE 1 TABLET BY MOUTH EVERY DAY 05/28/19   Burnard Hawthorne, FNP  Multiple Vitamins-Minerals (CENTRUM ADULTS) TABS Take 1 tablet by mouth daily. 03/14/14   Jackolyn Confer, MD    Review of Systems    She denies dyspnea, pnd, orthopnea, n, v, syncope, edema, or early satiety. She reports weight gain 2/2 caloric intake with clinic weight down from previous. She reports continued episodes of right neck pulsation, pressure on the front of her throat/high neck, shortness of  breath at rest during these episodes.  She also occasionally notices dizziness with these episodes.  She notes bilateral lower extremity weakness and coldness as above.  All other systems reviewed and are otherwise negative except as noted above.  Physical Exam    VS:  BP 126/80 (BP Location: Left Arm, Patient Position: Sitting, Cuff Size: Normal)   Pulse 75   Ht '5\' 6"'  (1.676 m)   Wt 154 lb (69.9 kg)   SpO2 96%   BMI 24.86 kg/m  , BMI Body mass index is 24.86 kg/m. GEN: Well nourished, well developed, in no acute distress. HEENT: normal. Neck: Supple, no JVD, carotid bruits, or masses. Cardiac: RRR, 3/6 systolic murmur. No rubs or gallops. No clubbing, cyanosis, edema.  Radials 2+ /DP/PT 1+ bilaterally.  Respiratory:  Coarse breath sounds bilaterally. GI: Soft, nontender, nondistended, BS + x 4. MS: no deformity or atrophy. Skin: warm and dry, no rash. Neuro:  Strength and sensation are intact. Psych: Normal affect.  Accessory Clinical Findings    ECG personally reviewed by me today - NSR 75bpm, no acute changes- no acute changes.  VITALS Reviewed today   Temp Readings from Last 3 Encounters:  07/27/18 98.4 F (36.9 C)  07/13/18 97.7 F (36.5 C) (Axillary)  04/27/18 98 F (36.7 C) (Oral)   BP Readings from Last 3 Encounters:  07/09/19 126/80  06/19/19 (!) 170/82  05/17/19 130/80   Pulse Readings from Last 3 Encounters:  07/09/19 75  06/19/19 60  05/17/19 74    Wt Readings from Last 3 Encounters:  07/09/19 154 lb (69.9 kg)  06/19/19 159 lb 8 oz (72.3 kg)  05/17/19 155 lb (70.3 kg)     LABS  reviewed today   [place] present? Yes/No: No not current  Lab Results  Component Value Date   WBC 11.3 (H) 07/27/2018   HGB 15.2 (H) 07/27/2018   HCT 44.6 07/27/2018   MCV 92.1 07/27/2018   PLT 279.0 07/27/2018   Lab Results  Component Value Date   CREATININE 0.89 05/15/2019   BUN 15 05/15/2019   NA 140 05/15/2019   K 4.4 05/15/2019   CL 103  05/15/2019   CO2 30 05/15/2019   Lab Results  Component Value Date   ALT 19 05/15/2019   AST 21 05/15/2019   ALKPHOS 74 05/15/2019   BILITOT 0.8 05/15/2019   Lab Results  Component Value Date   CHOL 167 05/15/2019   HDL 58.10 05/15/2019   LDLCALC  90 05/15/2019   TRIG 93.0 05/15/2019   CHOLHDL 3 05/15/2019    Lab Results  Component Value Date   HGBA1C 5.8 03/23/2018   Lab Results  Component Value Date   TSH 3.05 03/23/2018     STUDIES/PROCEDURES reviewed today   Zio Predominantly NSR, HR 25-197 with average HR 65bpm. 100 SVT runs occurred. The run with the fastest interval lasted 1 minute and 26 seconds with maximum rate of 197 bpm, the longest lasting 50 min 54 seconds with an average rate of 144bpm. Second degree AVB Mobitz (Wenckebach) was present. SVT was detected within +- 45 seconds of symptomatic patient events. Isolated SVE were rare (<1.0%), SVE Couplets were rare (<1.0%), and SVE Triplets were rare (<1.0%). Isolated VE were rare (<1.0%), VE Couplets were rare (<1.0%), and no VE Triplets were present. MD notification for SVT met.   Echo 06/05/19 1. Left ventricular ejection fraction, by visual estimation, is 60 to  65%. The left ventricle has normal function. There is borderline left  ventricular hypertrophy.  2. Left ventricular diastolic parameters are consistent with Grade II  diastolic dysfunction (pseudonormalization).  3. The left ventricle has no regional wall motion abnormalities.  4. Global right ventricle has normal systolic function.The right  ventricular size is normal. Right vetricular wall thickness was not  assessed.  5. Left atrial size was normal.  6. Right atrial size was normal.  7. The mitral valve is normal in structure. No evidence of mitral valve  regurgitation.  8. The tricuspid valve is normal in structure.  9. The tricuspid valve is normal in structure. Tricuspid valve  regurgitation is mild.  10. The aortic valve is  abnormal. Aortic valve regurgitation is not  visualized. Mild to moderate aortic valve stenosis.  11. Peak AV gradient 27mHg, MG 122mg, DVI 0.45, with LVOT diameter of  2cm, AVA 1.4cm2.  12. The pulmonic valve was not well visualized. Pulmonic valve  regurgitation is not visualized.  13. Mildly elevated pulmonary artery systolic pressure.  14. The inferior vena cava is normal in size with greater than 50%  respiratory variability, suggesting right atrial pressure of 3 mmHg.    Assessment & Plan    SVT --Continues to experience sx and has not yet started her diltiazem 120101mas she had wanted to finish her amlodipine first. However, given her ongoing sx, she is agreeable to stop the amlodipine and start diltiazem 120m88mday. We will start with diltiazem, rather than a BB to control her sx and given lung dz and need for extra BP support.  Reviewed Zio results again as outlined/copied pasted above and with information provided in AVS regarding SVT again. Given her preference to avoid new or changing medications, discussed ablation as well / EP referral with patient preference to defer and first try the diltiazem. She will call with BP readings as outlined above, given the transition to a different medication. We will plan to escalate diltiazem if needed for optimal sx and BP control.  She plans to monitor her sx with a log and see if her sx are worse with caffeine, stressors, or time of day.  Mobitz I (Wenckebach) Possible sleep apnea --We again discussed the recommendation for a sleep study, given her  Mobitz I (Wenckebach) in the early hours of the morning and while asleep. She prefers to defer.  Pulmonology referral provided for her COPD with strong recommendation to discuss sleep study at that time as well.  Coronary artery disease involving the native coronary arteries  on CT Aortic atherosclerosis on CT Bilateral lower extremity leg weakness, coldness, calf pain, reduced  pulses --Previous CT scan of the lungs showed aortic atherosclerosis and coronary atherosclerosis.  She does have significant risk factors for coronary artery disease, including that she is a current smoker. She now reports bilateral lower extremity weakness, coldness, and calf cramping. Recommendation was for bilateral arterial study / ABIs with patient preference to defer. We again discussed escalation of her statin today with patient agreeable to slow escalation. She will increase to Lipitor 33m BID for now, given she still has part of a prescription remaining. She will plan to start Lipitor 846monce out of her current prescription. She prefers to wait to check her lipid and liver until that time and has thus deferred labs.  Continue ASA. No s/sx of bleeding. No current angina or concerning sx. No current plan for further ischemic work-up.  Risk factor modification discussed / lifestyle changes.  Smoking cessation encouraged.  HLD --As above. Recommendation is for Lipitor 8085maily, which patient deferred at this time. She is agreeable to the following plan: Increase Lipitor to 20 mg daily for now and until she runs out of her Lipitor 39m61mlls, at which time she will increase to Lipitor 80mg64mly. She prefers to defer lipid and liver function until that time. Reassess at RTC. LDL goal <70. Continue ASA. Lifestyle changes discussed, including smoking cessation.  AS, mild to moderate --Aortic valve with mild moderate stenosis as updated echo as above.  Discussed that this may contribute to her dizziness/presyncope.  Audible murmur on exam. Continue to monitor with periodic echo and instructed her to call if any signs or symptoms of worsening valvular disease.   Essential hypertension, poorly controlled --Blood pressure improved with amlodipine increased to 10 mg daily for blood pressure control; however, as we now plan to transition to Cardizem for SVT, she will monitor her BP at home and call if  SBP over 130 and DBP over 80, at which time we can either increase her dose of lisinopril or Cardizem if needed. Low-salt diet discussed, as well as reduced caffeine and nicotine.  Discussed recommendation for sleep study, given this can influence BP over time. Increase activity as tolerated.   Ascending aortic aneurysm, previously noted on CT 05/17/18 --Previous 1/16 CT scan noted 4.3 cm ascending aortic aneurysm, not noted on most recent echo or 04/2019 CT.  Recommendation is for ongoing heart rate and blood pressure control. At RTC, reassess increase to Lipitor 80mg 40my to continue with ASA. She is agreeable to increase to Lipitor 20mg f55mow and until running out of her statin as above. Smoking cessation advised. Avoid heavy lifting and FQ.   Carotid artery disease s/p R carotid endarterectomy --Followed by vascular surgery.  No bruit heard on exam.  No reported amaurosis fugax. Smoking / alcohol caffeine cessation, BP / heart rate control.  Discussed increased dose of statin as directly above and reassess if patient agreeable to escalate to Lipitor 80mg at28m, per GDMT. Continue ASA.  Risk factor modification recommended.  Current smoker (1/2-2 packs / day) --Cessation discussed at length today.  She does not wish to quit at this time. We discussed a referral to pulmonology, which will be provided today given the L lung nodule as directly below. Also recommended she discuss a sleep study at that time.  Nodule of the left lung --Per 04/02/2019 CT, lung RADS 2 with benign appearance of behavior.  Discussed recommendation on CT scan  report to continue annual screening with low-dose chest CT without contrast every 12 months. Referral provided to pulmonology. Recommended she discuss a sleep study at that time.   Medication changes: Recommendation is for Lipitor 73m daily, which patient deferred at this time. She is agreeable to the following plan: Increase Lipitor to 20 mg daily for now and  until she runs out of her Lipitor 190mpills.  She is agreeable to stop amlodipine and start diltiazem 120 mg and call with blood pressures, so that we are able to escalate as tolerated for optimal BP and SVT control. Labs ordered: None. She prefers to defer lipid and LFT until escalated to Lipitor 8026maily. Studies / Imaging ordered: None. Future considerations: At RTC, repeat lipid and liver function and ensure up to Lipitor 47m42mily.  Reassess EP referral for SVT.  Check BP on diltiazem. Confirm she is no longer taking amlodipine. Reassess if agreeable to sleep study.  Reassess smoking cessation.  If BP indicates, increase of lisinopril /diltiazem if indicated. Disposition: RTC 2 weeks with patient preference instead to RTC in 4 weeks.  Total time spent with patient today 45 minutes. This includes reviewing records, evaluating the patient, and coordinating care. Face-to-face time >50%.    JacqArvil Chaco-C 07/09/2019

## 2019-07-09 ENCOUNTER — Encounter: Payer: Self-pay | Admitting: Physician Assistant

## 2019-07-09 ENCOUNTER — Other Ambulatory Visit: Payer: Self-pay

## 2019-07-09 ENCOUNTER — Ambulatory Visit (INDEPENDENT_AMBULATORY_CARE_PROVIDER_SITE_OTHER): Payer: Medicare Other | Admitting: Physician Assistant

## 2019-07-09 VITALS — BP 126/80 | HR 75 | Ht 66.0 in | Wt 154.0 lb

## 2019-07-09 DIAGNOSIS — R911 Solitary pulmonary nodule: Secondary | ICD-10-CM

## 2019-07-09 DIAGNOSIS — I10 Essential (primary) hypertension: Secondary | ICD-10-CM

## 2019-07-09 DIAGNOSIS — E785 Hyperlipidemia, unspecified: Secondary | ICD-10-CM | POA: Diagnosis not present

## 2019-07-09 DIAGNOSIS — I441 Atrioventricular block, second degree: Secondary | ICD-10-CM

## 2019-07-09 DIAGNOSIS — I700 Atherosclerosis of aorta: Secondary | ICD-10-CM

## 2019-07-09 DIAGNOSIS — Z5320 Procedure and treatment not carried out because of patient's decision for unspecified reasons: Secondary | ICD-10-CM

## 2019-07-09 DIAGNOSIS — I471 Supraventricular tachycardia, unspecified: Secondary | ICD-10-CM

## 2019-07-09 DIAGNOSIS — I2510 Atherosclerotic heart disease of native coronary artery without angina pectoris: Secondary | ICD-10-CM

## 2019-07-09 DIAGNOSIS — I350 Nonrheumatic aortic (valve) stenosis: Secondary | ICD-10-CM

## 2019-07-09 DIAGNOSIS — I7121 Aneurysm of the ascending aorta, without rupture: Secondary | ICD-10-CM

## 2019-07-09 DIAGNOSIS — Z720 Tobacco use: Secondary | ICD-10-CM | POA: Diagnosis not present

## 2019-07-09 DIAGNOSIS — Z9889 Other specified postprocedural states: Secondary | ICD-10-CM

## 2019-07-09 DIAGNOSIS — I712 Thoracic aortic aneurysm, without rupture: Secondary | ICD-10-CM

## 2019-07-09 MED ORDER — ATORVASTATIN CALCIUM 10 MG PO TABS: 20.0000 mg | ORAL_TABLET | Freq: Every day | ORAL | Status: DC

## 2019-07-09 NOTE — Patient Instructions (Signed)
Medication Instructions:  Your physician has recommended you make the following change in your medication:   1) INCREASE Atorvastatin to 20 mg daily. Please contact our office to let us know when your supply at home is completed.  2) START Diltiazem 120 mg daily as previously prescribed.  3) STOP Amlodipine.  Continue your other medications a prescribed.  *If you need a refill on your cardiac medications before your next appointment, please call your pharmacy*   Lab Work: None ordered If you have labs (blood work) drawn today and your tests are completely normal, you will receive your results only by: Marland Kitchen MyChart Message (if you have MyChart) OR . A paper copy in the mail If you have any lab test that is abnormal or we need to change your treatment, we will call you to review the results.   Testing/Procedures: None ordered   Follow-Up: At [facility], you and your health needs are our priority.  As part of our continuing mission to provide you with exceptional heart care, we have created designated Provider Care Teams.  These Care Teams include your primary Cardiologist (physician) and Advanced Practice Providers (APPs -  Physician Assistants and Nurse Practitioners) who all work together to provide you with the care you need, when you need it.  We recommend signing up for the patient portal called "MyChart".  Sign up information is provided on this After Visit Summary.  MyChart is used to connect with patients for Virtual Visits (Telemedicine).  Patients are able to view lab/test results, encounter notes, upcoming appointments, etc.  Non-urgent messages can be sent to your provider as well.   To learn more about what you can do with MyChart, go to [email or web address].    Your next appointment:   2 week(s)   You have been referred to [facility]. Their office will contact you directly to schedule the appointment.   The format for your next appointment:   In  Person  Provider:    You may see Dr. Fletcher Anon or one of the following Advanced Practice Providers on your designated Care Team:    Murray Hodgkins, NP  Christell Faith, PA-C  [name], PA-C    Other Instructions Your physician has requested that you regularly monitor and record your blood pressure readings at home. Please use the same machine at the same time of day to check your readings and record them. Contact the office if your blood pressure is consistently > 130/80.     Supraventricular Tachycardia, Adult Supraventricular tachycardia (SVT) is a kind of abnormal heartbeat. It makes your heart beat very fast and then beat at a normal speed. A normal resting heartbeat is 60-100 times a minute. This condition can make your heart beat more than 150 times a minute. Times of having a fast heartbeat (episodes) can be scary, but they are usually not dangerous. In some cases, they may lead to heart failure if:  They happen many times per day.  Last longer than a few seconds. What are the causes?  A normal heartbeat starts when an area called the sinoatrial node sends out an electrical signal. In SVT, other areas of the heart send out signals that get in the way of the signal from the sinoatrial node. What increases the risk? You are more likely to develop this condition if you are:  92-60 years old.  A woman. The following factors may make you more likely to develop this condition:  Stress.  Tiredness.  Smoking.  Stimulant  drugs, such as cocaine and methamphetamine.  Alcohol.  Caffeine.  Pregnancy.  Feeling worried or nervous (anxiety). What are the signs or symptoms?  A pounding heart.  A feeling that your heart is skipping beats (palpitations).  Weakness.  Trouble getting enough air.  Pain or tightness in your chest.  Feeling like you are going to pass out (faint).  Feeling worried or nervous.  Dizziness.  Sweating.  Feeling sick to your stomach  (nausea).  Passing out.  Tiredness. Sometimes, there are no symptoms. How is this treated?  Vagal nerve stimulation. Ways to do this include: ? Holding your breath and pushing, as though you are pooping (having a bowel movement). ? Massaging an area on one side of your neck. Do not try this yourself. Only a doctor should do this. If done the wrong way, it can lead to a stroke. ? Bending forward with your head between your legs. ? Coughing while bending forward with your head between your legs. ? Closing your eyes and massaging your eyeballs. Ask a doctor how to do this.  Medicines that prevent attacks.  Medicine to stop an attack given through an IV tube at the hospital.  A small electric shock (cardioversion) that stops an attack.  Radiofrequency ablation. In this procedure, a small, thin tube (catheter) is used to send energy to the area that is causing the rapid heartbeats. If you do not have symptoms, you may not need treatment. Follow these instructions at home: Stress  Avoid things that make you feel stressed.  To deal with stress, try: ? Doing yoga or meditation, or being out in nature. ? Listening to relaxing music. ? Doing deep breathing. ? Taking steps to be healthy, such as getting lots of sleep, exercising, and eating a balanced diet. ? Talking with a mental health doctor. Lifestyle   Try to get at least 7 hours of sleep each night.  Do not use any products that contain nicotine or tobacco, such as cigarettes, e-cigarettes, and chewing tobacco. If you need help quitting, ask your doctor.  Be aware of how alcohol affects you. ? If alcohol gives you a fast heartbeat, do not drink alcohol. ? If alcohol does not seem to give you a fast heartbeat, limit alcohol use to no more than 1 drink a day for women who are not pregnant, and 2 drinks a day for men. In the [place] one drink is one of these:  12 oz of beer (355 mL).  5 oz of wine (148 mL).  1 oz of hard  liquor (44 mL).  Be aware of how caffeine affects you. ? If caffeine gives you a fast heartbeat, do not eat, drink, or use anything with caffeine in it. ? If caffeine does not seem to give you a fast heartbeat, limit how much caffeine you eat, drink, or use.  Do not use stimulant drugs. If you need help quitting, ask your doctor. General instructions  Stay at a healthy weight.  Exercise regularly. Ask your doctor about good activities for you. Try one or a mixture of these: ? 150 minutes a week of gentle exercise, like walking or yoga. ? 75 minutes a week of exercise that is very active, like running or swimming.  Do vagus nerve treatments to slow down your heartbeat as told by your doctor.  Take over-the-counter and prescription medicines only as told by your doctor.  Keep all follow-up visits as told by your doctor. This is important. Contact a doctor if:  You have a fast heartbeat more often.  Times of having a fast heartbeat last longer than before.  Home treatments to slow down your heartbeat do not help.  You have new symptoms. Get help right away if:  You have chest pain.  Your symptoms get worse.  You have trouble breathing.  Your heart beats very fast for more than 20 minutes.  You pass out. These symptoms may be an emergency. Do not wait to see if the symptoms will go away. Get medical help right away. Call your local emergency services (911 in the [place] Do not drive yourself to the hospital. Summary  SVT is a type of abnormal heart beat.  This condition can make your heart beat more than 150 times a minute.  Treatment depends on how often the condition happens and your symptoms. This information is not intended to replace advice given to you by your health care provider. Make sure you discuss any questions you have with your health care provider. Document Revised: 03/06/2018 Document Reviewed: 03/06/2018 Elsevier Patient Education  2020 [employer].

## 2019-07-12 ENCOUNTER — Other Ambulatory Visit: Payer: Self-pay

## 2019-07-12 ENCOUNTER — Encounter: Payer: Self-pay | Admitting: Family

## 2019-07-12 ENCOUNTER — Ambulatory Visit (INDEPENDENT_AMBULATORY_CARE_PROVIDER_SITE_OTHER): Payer: Medicare Other | Admitting: Family

## 2019-07-12 DIAGNOSIS — I471 Supraventricular tachycardia: Secondary | ICD-10-CM | POA: Diagnosis not present

## 2019-07-12 DIAGNOSIS — R1310 Dysphagia, unspecified: Secondary | ICD-10-CM

## 2019-07-12 DIAGNOSIS — E7800 Pure hypercholesterolemia, unspecified: Secondary | ICD-10-CM | POA: Diagnosis not present

## 2019-07-12 NOTE — Patient Instructions (Addendum)
Always a delight seeing you.   Please let me know about swallowing and certainly if this were to worsen in any way  Stay safe!

## 2019-07-12 NOTE — Assessment & Plan Note (Signed)
Stable.  Patient is currently followed with cardiology in regards to escalating this regimen which I think is very appropriate.  Continue to follow.

## 2019-07-12 NOTE — Assessment & Plan Note (Signed)
Chronic. Currently following very closely with cardiology, PA Viser and Dr.Arida.  Recent change from amlodipine to diltiazem.  Patient has not started this medication yet, plans to today.  Advised her to follow this closely with cardiology and certainly stay vigilant regarding the pulsation sensation in her neck, recurrent episodes.

## 2019-07-12 NOTE — Progress Notes (Signed)
Subjective:    Patient ID: Tracy Bell, female    DOB: [birth year], 69 y.o.   MRN: [id number]  CC: Tracy Bell is a 69 y.o. female who presents today for follow up.   HPI: Feels overall well  Worked in yard . Didnt feel neck pulsation yesterday while working in yard. Episodes occur at random, can be sitting or with activity. Plans to start cardizem today. Has stopped amlodipine.    Feels sob 'from smoking.' Doesn't limit her activities. Busy in the home, caring for 12 animals, and plans to work in yard now that weather is warmer.   No cp, dizziness,syncope.  Dysphagia- unchanged. Not choking. Able swallow medications. No voice change, epigatric burning, weight loss.    Due MM, dexa, prevnar; she declines at this time.   Pulmonology scheduled with Dr Patsey Berthold 08/29/19  HLD- lipitor 10mg  BID with plan to increase troi lipitor 80mg . On asa  07/09/19 [facility] cardiology- plan to increase lipitor, periodic echo for mild - moderate aortic valve stenosis.Changed from amlodipine 10 mg to cardizem for SVR.  Noticed bladder prolapse, stress incontinence with coughing.    HISTORY:  Past Medical History:  Diagnosis Date  . Anxiety   . COPD (chronic obstructive pulmonary disease) ([place])   . Cough   . Depression   . Dizziness   . Dyspnea   . Heart murmur   . Hyperlipidemia   . Hypertension   . Lightheadedness   . Personal history of tobacco use, presenting hazards to health 08/28/2015   Past Surgical History:  Procedure Laterality Date  . ABDOMINAL HYSTERECTOMY  1980   complete  . BREAST BIOPSY  1970   normal  . DILATION AND CURETTAGE OF UTERUS    . ENDARTERECTOMY Right 07/12/2018   Procedure: ENDARTERECTOMY CAROTID;  Surgeon: Algernon Huxley, MD;  Location: [facility] ORS;  Service: Vascular;  Laterality: Right;  . TUBAL LIGATION     Family History  Problem Relation Age of Onset  . Cancer Mother        Pancreatic Cancer   . Cancer Father        Stomach Cancer   . Lung cancer  Sister   . Cancer Sister        ovarian  . Lung cancer Sister     Allergies: Patient has no known allergies. Current Outpatient Medications on File Prior to Visit  Medication Sig Dispense Refill  . aspirin EC 81 MG tablet Take 1 tablet (81 mg total) by mouth daily. 30 tablet 6  . atorvastatin (LIPITOR) 10 MG tablet Take 2 tablets (20 mg total) by mouth daily.    . cholecalciferol (VITAMIN D3) 25 MCG (1000 UT) tablet Take 1,000 Units by mouth daily.    Marland Kitchen diltiazem (CARDIZEM CD) 120 MG 24 hr capsule Take 1 capsule (120 mg total) by mouth daily. 30 capsule 3  . lisinopril (ZESTRIL) 5 MG tablet TAKE 1 TABLET BY MOUTH EVERY DAY 90 tablet 3  . Multiple Vitamins-Minerals (CENTRUM ADULTS) TABS Take 1 tablet by mouth daily. 30 tablet 6   No current facility-administered medications on file prior to visit.    Social History   Tobacco Use  . Smoking status: Current Some Day Smoker    Packs/day: 2.00    Years: 60.00    Pack years: 120.00  . Smokeless tobacco: Never Used  Substance Use Topics  . Alcohol use: Yes    Alcohol/week: 0.0 standard drinks    Comment: occasionally  . Drug use:  No    Review of Systems  Constitutional: Negative for chills and fever.  HENT: Positive for trouble swallowing.   Respiratory: Negative for cough.   Cardiovascular: Positive for palpitations. Negative for chest pain.  Gastrointestinal: Negative for nausea and vomiting.  Neurological: Negative for dizziness and syncope.      Objective:    BP 132/82   Pulse 83   Temp 97.7 F (36.5 C)   Ht 5\' 5"  (1.651 m)   Wt 156 lb 12.8 oz (71.1 kg)   SpO2 98%   BMI 26.09 kg/m  BP Readings from Last 3 Encounters:  07/12/19 132/82  07/09/19 126/80  06/19/19 (!) 170/82   Wt Readings from Last 3 Encounters:  07/12/19 156 lb 12.8 oz (71.1 kg)  07/09/19 154 lb (69.9 kg)  06/19/19 159 lb 8 oz (72.3 kg)    Physical Exam Vitals reviewed.  Constitutional:      Appearance: She is well-developed.  Eyes:      Conjunctiva/sclera: Conjunctivae normal.  Cardiovascular:     Rate and Rhythm: Normal rate and regular rhythm.     Pulses: Normal pulses.     Heart sounds: Normal heart sounds.  Pulmonary:     Effort: Pulmonary effort is normal.     Breath sounds: Normal breath sounds. No wheezing, rhonchi or rales.  Skin:    General: Skin is warm and dry.  Neurological:     Mental Status: She is alert.  Psychiatric:        Speech: Speech normal.        Behavior: Behavior normal.        Thought Content: Thought content normal.        Assessment & Plan:   Problem List Items Addressed This Visit      Cardiovascular and Mediastinum   SVT (supraventricular tachycardia) (HCC)    Chronic. Currently following very closely with cardiology, PA Viser and Dr.Arida.  Recent change from amlodipine to diltiazem.  Patient has not started this medication yet, plans to today.  Advised her to follow this closely with cardiology and certainly stay vigilant regarding the pulsation sensation in her neck, recurrent episodes.         Digestive   Dysphagia    Chronic, unchanged.  The symptom has not progressed.  Patient politely declines further evaluation at this time.        Other   Hypercholesteremia    Stable.  Patient is currently followed with cardiology in regards to escalating this regimen which I think is very appropriate.  Continue to follow.        Declines mammogram, bone density and Prevnar today.  I am having [name] maintain her aspirin EC, Centrum Adults, cholecalciferol, lisinopril, diltiazem, and atorvastatin.   No orders of the defined types were placed in this encounter.   Return precautions given.   Risks, benefits, and alternatives of the medications and treatment plan prescribed today were discussed, and patient expressed understanding.   Education regarding symptom management and diagnosis given to patient on AVS.  Continue to follow with Burnard Hawthorne, FNP for  routine health maintenance.   Almadelia M Chenoweth and I agreed with plan.   [name], FNP

## 2019-07-12 NOTE — Assessment & Plan Note (Signed)
Chronic, unchanged.  The symptom has not progressed.  Patient politely declines further evaluation at this time.

## 2019-07-22 ENCOUNTER — Telehealth: Payer: Self-pay | Admitting: Cardiovascular Disease

## 2019-07-22 ENCOUNTER — Other Ambulatory Visit: Payer: Self-pay

## 2019-07-22 DIAGNOSIS — I700 Atherosclerosis of aorta: Secondary | ICD-10-CM

## 2019-07-22 MED ORDER — ATORVASTATIN CALCIUM 20 MG PO TABS: 20.0000 mg | ORAL_TABLET | Freq: Every day | ORAL | 0 refills | Status: DC

## 2019-07-22 NOTE — Telephone Encounter (Signed)
*  STAT* If patient is at the pharmacy, call can be transferred to refill team.   1. Which medications need to be refilled? (please list name of each medication and dose if known)  Atorvastatin 20 mg po q d   2. Which pharmacy/location (including street and city if local pharmacy) is medication to be sent to? [facility] [address] [place]   3. Do they need a 30 day or 90 day supply? [place]

## 2019-07-22 NOTE — Telephone Encounter (Signed)
atorvastatin (LIPITOR) 20 MG tablet 90 tablet 0 07/22/2019    Sig - Route: Take 1 tablet (20 mg total) by mouth daily. - Oral   Sent to pharmacy as: atorvastatin (LIPITOR) 20 MG tablet   E-Prescribing Status: Receipt confirmed by pharmacy (07/22/2019  4:38 PM EDT)   Associated Diagnoses  Atherosclerosis of aorta [facility])     Pharmacy  [id number] [name], [place]

## 2019-07-23 NOTE — Telephone Encounter (Signed)
Patient message sent for further detail.

## 2019-07-23 NOTE — Telephone Encounter (Signed)
-----   Message from Arvil Chaco, PA-C sent at 07/23/2019  5:40 AM EDT ----- Regarding: BP check Happy Tuesday,  Are we able to follow-up with this patient regarding start of diltiazem to ensure that her BP is still controlled. If possible, can we find out how she is tolerating the medication and what her home BP has been lately?  Thank you!  JV

## 2019-08-01 ENCOUNTER — Ambulatory Visit: Payer: Medicare Other | Attending: Internal Medicine

## 2019-08-01 DIAGNOSIS — Z23 Encounter for immunization: Secondary | ICD-10-CM

## 2019-08-01 NOTE — Progress Notes (Signed)
   Covid-19 Vaccination Clinic  Name:  Tracy Bell    MRN: [id number] DOB: 21-Jan-1951  08/01/2019  Ms. [name] was observed post Covid-19 immunization for 15 minutes without incident. She was provided with Vaccine Information Sheet and instruction to access the V-Safe system.   Ms. [name] was instructed to call 911 with any severe reactions post vaccine: Marland Kitchen Difficulty breathing  . Swelling of face and throat  . A fast heartbeat  . A bad rash all over body  . Dizziness and weakness   Immunizations Administered    Name Date Dose VIS Date Route   Pfizer COVID-19 Vaccine 08/01/2019 10:03 AM 0.3 mL 04/12/2019 Intramuscular   Manufacturer: [place]   Lot: [id number]   [name]: [id number]

## 2019-08-13 ENCOUNTER — Ambulatory Visit: Payer: Medicare Other | Admitting: Physician Assistant

## 2019-08-28 ENCOUNTER — Other Ambulatory Visit: Payer: Self-pay

## 2019-08-28 ENCOUNTER — Ambulatory Visit: Payer: Medicare Other | Attending: Internal Medicine

## 2019-08-28 DIAGNOSIS — Z23 Encounter for immunization: Secondary | ICD-10-CM

## 2019-08-28 NOTE — Progress Notes (Signed)
   Covid-19 Vaccination Clinic  Name:  Tracy Bell    MRN: [id number] DOB: 10-28-1950  08/28/2019  Tracy Bell was observed post Covid-19 immunization for 15 minutes without incident. She was provided with Vaccine Information Sheet and instruction to access the V-Safe system.   Tracy Bell was instructed to call 911 with any severe reactions post vaccine: Marland Kitchen Difficulty breathing  . Swelling of face and throat  . A fast heartbeat  . A bad rash all over body  . Dizziness and weakness   Immunizations Administered    Name Date Dose VIS Date Route   Pfizer COVID-19 Vaccine 08/28/2019  9:26 AM 0.3 mL 06/26/2018 Intramuscular   Manufacturer: [place]   Lot: [id number]   [place]: [id number]

## 2019-08-29 ENCOUNTER — Other Ambulatory Visit: Payer: Self-pay

## 2019-08-29 ENCOUNTER — Ambulatory Visit: Payer: Medicare Other | Admitting: Pulmonary Disease

## 2019-08-29 ENCOUNTER — Encounter: Payer: Self-pay | Admitting: Pulmonary Disease

## 2019-08-29 VITALS — BP 126/82 | HR 74 | Temp 98.4°F | Ht 65.5 in | Wt 157.0 lb

## 2019-08-29 DIAGNOSIS — F17210 Nicotine dependence, cigarettes, uncomplicated: Secondary | ICD-10-CM

## 2019-08-29 DIAGNOSIS — R1319 Other dysphagia: Secondary | ICD-10-CM

## 2019-08-29 DIAGNOSIS — J449 Chronic obstructive pulmonary disease, unspecified: Secondary | ICD-10-CM | POA: Diagnosis not present

## 2019-08-29 DIAGNOSIS — I350 Nonrheumatic aortic (valve) stenosis: Secondary | ICD-10-CM

## 2019-08-29 DIAGNOSIS — R0602 Shortness of breath: Secondary | ICD-10-CM | POA: Diagnosis not present

## 2019-08-29 NOTE — Patient Instructions (Signed)
We are going to get breathing test scheduled   We will see him in follow-up in 3 months time call sooner should any new difficulties arise.

## 2019-08-29 NOTE — Progress Notes (Signed)
Subjective:    Patient ID: Tracy Bell, female    DOB: [birth year], 69 y.o.   MRN: [id number]  HPI Patient is a 69 year old current smoker (2 PPD) presents for evaluation of dyspnea and potential COPD.  She is kindly referred by Lorenso Quarry, PA-C.  She has a history of cardiac arrhythmias, aortic stenosis (moderate) and carotid artery disease.  As noted she is currently a heavy smoker.  She has noted some dyspnea on exertion.  She has been more cognizant of this since 69 after her husband passed away.  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  She notices wheezing which is quite frequent and also notices cough in the mornings productive of yellowish to whitish sputum.  No hemoptysis.  No chest pain.  She has had issues with dyspepsia and dysphagia.  She has not had any chest pain but has had palpitations did have one episode of presyncope related to her palpitations.  She also notices occasional nausea but no vomiting.  She has not had any weight loss or anorexia.  She does note that she is "cold natured.  She has occasional palpitations which are being managed by cardiology.  She does not endorse orthopnea or paroxysmal nocturnal dyspnea.  She has not had lower extremity edema nor calf tenderness.  She has previously resided in [place].  No travel outside of the country.   Review of Systems A 10 point review of systems was performed and it is as noted above otherwise negative.  Past Medical History:  Diagnosis Date  . Anxiety   . COPD (chronic obstructive pulmonary disease) ([place])   . Cough   . Depression   . Dizziness   . Dyspnea   . Heart murmur   . Hyperlipidemia   . Hypertension   . Lightheadedness   . Personal history of tobacco use, presenting hazards to health 08/28/2015   Past Surgical History:  Procedure Laterality Date  . ABDOMINAL HYSTERECTOMY  1980   complete  . BREAST BIOPSY  1970   normal  . DILATION AND CURETTAGE OF UTERUS    . ENDARTERECTOMY Right  07/12/2018   Procedure: ENDARTERECTOMY CAROTID;  Surgeon: Algernon Huxley, MD;  Location: [facility] ORS;  Service: Vascular;  Laterality: Right;  . TUBAL LIGATION     Family History  Problem Relation Age of Onset  . Cancer Mother        Pancreatic Cancer   . Cancer Father        Stomach Cancer   . Lung cancer Sister   . Cancer Sister        ovarian  . Lung cancer Sister    Social History   Tobacco Use  . Smoking status: Current Every Day Smoker    Packs/day: 2.00    Years: 60.00    Pack years: 120.00    Start date: 05/03/1963  . Smokeless tobacco: Never Used  Substance Use Topics  . Alcohol use: Yes    Alcohol/week: 0.0 standard drinks    Comment: occasionally   No military history, previously resided in [place].  No occupational exposure.  Has small business making plywood stands.  Pets in the home: 7 cats, 3 dogs.  No birds.  No Known Allergies  Medications were reviewed  Immunization History  Administered Date(s) Administered  . PFIZER SARS-COV-2 Vaccination 08/01/2019, 08/28/2019  . Pneumococcal Polysaccharide-23 07/13/2018  . Tdap 12/02/2014      Objective:   Physical Exam BP 126/82 (BP Location: Left Arm, Cuff  Size: Normal)   Pulse 74   Temp 98.4 F (36.9 C) (Temporal)   Ht 5' 5.5" (1.664 m)   Wt 157 lb (71.2 kg)   SpO2 96% Comment: on RA  BMI 25.73 kg/m   GENERAL: Awake, alert, fully ambulatory.  No respiratory distress, no use of accessories    HEAD: Normocephalic, atraumatic.  EYES: Pupils equal, round, reactive to light.  No scleral icterus.  MOUTH: Nose/mouth/throat not examined due to masking requirements for COVID 19. NECK: Supple. No thyromegaly. Trachea midline. No JVD.  No adenopathy. PULMONARY: Good air entry bilaterally.  Scattered rhonchi noted bilaterally.  No wheezes.  No rales. CARDIOVASCULAR: S1 and S2. Regular rate and rhythm.  She has a harsh 2/6 to 3/6 murmur consistent with aortic stenosis.  No rubs or gallops noted. GASTROINTESTINAL: No  distention noted. MUSCULOSKELETAL: No joint deformity, no clubbing, no edema.  NEUROLOGIC: Awake, alert, fully oriented.  Fluent speech.  No overt focal deficit. SKIN: Intact,warm,dry. PSYCH: Mood and behavior appropriate.     Assessment & Plan:     ICD-10-CM   1. SOB (shortness of breath)  R06.02 Pulmonary Function Test [facility] Only   Likely related to COPD by clinical impression Will obtain PFTs to better characterize  2. COPD suggested by initial evaluation ([place])  J44.9    Will obtain PFTs for confirmation of diagnosis  3. Aortic stenosis, moderate  I35.0    This issue adds complexity to her management May also play a role with regards to dyspnea  4. Other dysphagia  R13.19    May be related to reflux Trial of PPI Consider GI evaluation if no improvement  5. Tobacco dependence due to cigarettes  F17.210    Counseling with regards to discontinuation of smoking 3 to 5 minutes total Already enrolled in lung cancer screening   Orders Placed This Encounter  Procedures  . Pulmonary Function Test [facility] Only    Standing Status:   Future    Number of Occurrences:   1    Standing Expiration Date:   08/28/2020    Order Specific Question:   Full PFT: includes the following: basic spirometry, spirometry pre & post bronchodilator, diffusion capacity (DLCO), lung volumes    Answer:   Full PFT     Discussion:  Patient is issues with shortness of breath may be related to COPD.  I have reviewed last lung cancer screening CT scan of the chest.  She has moderate centrilobular emphysema.  Evaluation today also consistent with  COPD by clinical exam and history.  She has some issues with dysphagia that appear to be related perhaps to gastroesophageal reflux this was also evident on her last low-dose CT chest performed in December 2020.  She may benefit from trial of PPI but will defer this to primary care provider.  If symptoms persist she may benefit from GI evaluation.  We will see her in follow-up  in 3 months time she is to contact us prior to that time should any new difficulties arise.   Renold Don, MD [facility]   *This note was dictated using voice recognition software/Dragon.  Despite best efforts to proofread, errors can occur which can change the meaning.  Any change was purely unintentional.

## 2019-09-05 ENCOUNTER — Other Ambulatory Visit
Admission: RE | Admit: 2019-09-05 | Discharge: 2019-09-05 | Disposition: A | Discharge status: Home / Self Care | Payer: Medicare Other | Source: Ambulatory Visit | Attending: Pulmonary Disease | Admitting: Pulmonary Disease

## 2019-09-05 DIAGNOSIS — Z01812 Encounter for preprocedural laboratory examination: Secondary | ICD-10-CM | POA: Diagnosis not present

## 2019-09-05 DIAGNOSIS — Z20822 Contact with and (suspected) exposure to covid-19: Secondary | ICD-10-CM | POA: Insufficient documentation

## 2019-09-05 LAB — SARS CORONAVIRUS 2 (TAT 6-24 HRS): SARS Coronavirus 2: NEGATIVE

## 2019-09-06 ENCOUNTER — Other Ambulatory Visit: Payer: Self-pay

## 2019-09-06 ENCOUNTER — Ambulatory Visit: Payer: Medicare Other | Attending: Pulmonary Disease

## 2019-09-06 DIAGNOSIS — F17210 Nicotine dependence, cigarettes, uncomplicated: Secondary | ICD-10-CM | POA: Diagnosis not present

## 2019-09-06 DIAGNOSIS — R0602 Shortness of breath: Secondary | ICD-10-CM | POA: Insufficient documentation

## 2019-09-06 MED ORDER — ALBUTEROL SULFATE (2.5 MG/3ML) 0.083% IN NEBU
2.5000 mg | INHALATION_SOLUTION | Freq: Once | RESPIRATORY_TRACT | Status: AC
  Administered 2019-09-06: 11:00:00 2.5 mg via RESPIRATORY_TRACT
  Filled 2019-09-06: qty 3

## 2019-09-07 ENCOUNTER — Encounter: Payer: Self-pay | Admitting: Family

## 2019-09-20 ENCOUNTER — Ambulatory Visit: Payer: Medicare Other | Admitting: Physician Assistant

## 2019-09-25 ENCOUNTER — Other Ambulatory Visit: Payer: Self-pay | Admitting: Physician Assistant

## 2019-09-25 MED ORDER — DILTIAZEM HCL ER COATED BEADS 120 MG PO CP24: 120.0000 mg | ORAL_CAPSULE | Freq: Every day | ORAL | 0 refills | Status: DC

## 2019-09-25 NOTE — Telephone Encounter (Signed)
Requested Prescriptions   Signed Prescriptions Disp Refills   diltiazem (CARDIZEM CD) 120 MG 24 hr capsule 90 capsule 0    Sig: Take 1 capsule (120 mg total) by mouth daily.    Authorizing Provider: [name] D    Ordering User: Raelene Bott, Reagen Haberman L

## 2019-09-25 NOTE — Telephone Encounter (Signed)
*  STAT* If patient is at the pharmacy, call can be transferred to refill team.   1. Which medications need to be refilled? (please list name of each medication and dose if known) diltiazem 120   2. Which pharmacy/location (including street and city if local pharmacy) is medication to be sent to? [facility] [address]  3. Do they need a 30 day or 90 day supply? 90  Patient thought she had a 90 day Rx with refills, [facility] only did a [facility] day. Patient has been rescheduled for overdue 2 week fu for 6/2

## 2019-10-02 ENCOUNTER — Ambulatory Visit: Payer: Medicare Other | Admitting: Physician Assistant

## 2019-10-14 ENCOUNTER — Other Ambulatory Visit: Payer: Self-pay

## 2019-10-14 ENCOUNTER — Telehealth: Payer: Self-pay | Admitting: Family

## 2019-10-14 ENCOUNTER — Encounter: Payer: Self-pay | Admitting: Family

## 2019-10-14 ENCOUNTER — Ambulatory Visit (INDEPENDENT_AMBULATORY_CARE_PROVIDER_SITE_OTHER): Payer: Medicare Other | Admitting: Family

## 2019-10-14 VITALS — BP 130/88 | HR 80 | Temp 97.2°F | Resp 16 | Wt 156.4 lb

## 2019-10-14 DIAGNOSIS — R591 Generalized enlarged lymph nodes: Secondary | ICD-10-CM

## 2019-10-14 DIAGNOSIS — I6523 Occlusion and stenosis of bilateral carotid arteries: Secondary | ICD-10-CM

## 2019-10-14 DIAGNOSIS — R32 Unspecified urinary incontinence: Secondary | ICD-10-CM | POA: Insufficient documentation

## 2019-10-14 DIAGNOSIS — I714 Abdominal aortic aneurysm, without rupture, unspecified: Secondary | ICD-10-CM

## 2019-10-14 DIAGNOSIS — I10 Essential (primary) hypertension: Secondary | ICD-10-CM

## 2019-10-14 NOTE — Telephone Encounter (Signed)
Pt scheduled a CT scan and they told her she needs labs before 10/27/19

## 2019-10-14 NOTE — Progress Notes (Signed)
Subjective:    Patient ID: Tracy Bell, female    DOB: [birth year], 68 y.o.   MRN: [id number]  CC: Tracy Bell is a 69 y.o. female who presents today for follow up.   HPI:  Complains of bladder prolapse, and can feel bladder protruding. This is intermittent.  No dysuria, hematuria, vaginal bleeding.   Complains of stress incontinence with cough especially in the mornings when she is smoking.  Has seen urology in the past for this.   Continues to have dysphagia. Has had two choking instances after drinking liquids.  Has felt 'knot' on right side of neck since endarectomy. Non tender. No weight loss, fever, night sweats.  following with Dr  Patsey Berthold- last appointment 08/29/19 ( no assessment and plan noted). Sees her tomorrow for follow up.   SVR-continues to have occasional palpitations 'at random'; this is been the typical course for her and is unchanged.  She remains compliant with cardizem, lisinopril. Follows with [facility], [place].   HLD- compliant lipitor 80mg   HTN- at home, typically around 130/70. No cp.  Would like information on advanced directives.Has completed living medical attorney.  Smoker H/o hysterectomy H/o endartectomy right 2020 HISTORY:  Past Medical History:  Diagnosis Date  . Anxiety   . COPD (chronic obstructive pulmonary disease) ([place])   . Cough   . Depression   . Dizziness   . Dyspnea   . Heart murmur   . Hyperlipidemia   . Hypertension   . Lightheadedness   . Personal history of tobacco use, presenting hazards to health 08/28/2015   Past Surgical History:  Procedure Laterality Date  . ABDOMINAL HYSTERECTOMY  1980   complete  . BREAST BIOPSY  1970   normal  . DILATION AND CURETTAGE OF UTERUS    . ENDARTERECTOMY Right 07/12/2018   Procedure: ENDARTERECTOMY CAROTID;  Surgeon: Algernon Huxley, MD;  Location: [facility] ORS;  Service: Vascular;  Laterality: Right;  . TUBAL LIGATION     Family History  Problem Relation Age of Onset  .  Cancer Mother        Pancreatic Cancer   . Cancer Father        Stomach Cancer   . Lung cancer Sister   . Cancer Sister        ovarian  . Lung cancer Sister     Allergies: Patient has no known allergies. Current Outpatient Medications on File Prior to Visit  Medication Sig Dispense Refill  . aspirin EC 81 MG tablet Take 1 tablet (81 mg total) by mouth daily. 30 tablet 6  . atorvastatin (LIPITOR) 20 MG tablet Take 1 tablet (20 mg total) by mouth daily. 90 tablet 0  . cholecalciferol (VITAMIN D3) 25 MCG (1000 UT) tablet Take 1,000 Units by mouth daily.    Marland Kitchen diltiazem (CARDIZEM CD) 120 MG 24 hr capsule Take 1 capsule (120 mg total) by mouth daily. 90 capsule 0  . lisinopril (ZESTRIL) 5 MG tablet TAKE 1 TABLET BY MOUTH EVERY DAY 90 tablet 3  . Multiple Vitamins-Minerals (CENTRUM ADULTS) TABS Take 1 tablet by mouth daily. 30 tablet 6   No current facility-administered medications on file prior to visit.    Social History   Tobacco Use  . Smoking status: Current Every Day Smoker    Packs/day: 2.00    Years: 60.00    Pack years: 120.00    Start date: 05/03/1963  . Smokeless tobacco: Never Used  Vaping Use  . Vaping Use: Never used  Substance Use Topics  . Alcohol use: Yes    Alcohol/week: 0.0 standard drinks    Comment: occasionally  . Drug use: No    Review of Systems  Constitutional: Negative for chills, diaphoresis, fever and unexpected weight change.  HENT: Positive for trouble swallowing.   Respiratory: Positive for cough (chronic).   Cardiovascular: Negative for chest pain and palpitations.  Gastrointestinal: Negative for nausea and vomiting.  Genitourinary: Positive for urgency. Negative for difficulty urinating, menstrual problem and pelvic pain.  Skin: Negative for wound.  Hematological: Positive for adenopathy (right side neck).      Objective:    BP 130/88   Pulse 80   Temp (!) 97.2 F (36.2 C)   Resp 16   Wt 156 lb 6.4 oz (70.9 kg)   SpO2 96%   BMI  25.63 kg/m  BP Readings from Last 3 Encounters:  10/15/19 132/78  10/14/19 130/88  08/29/19 126/82   Wt Readings from Last 3 Encounters:  10/15/19 157 lb 9.6 oz (71.5 kg)  10/14/19 156 lb 6.4 oz (70.9 kg)  08/29/19 157 lb (71.2 kg)    Physical Exam Vitals reviewed.  Constitutional:      Appearance: She is well-developed.  Eyes:     Conjunctiva/sclera: Conjunctivae normal.  Neck:     Thyroid: No thyroid mass.  Cardiovascular:     Rate and Rhythm: Normal rate and regular rhythm.     Pulses: Normal pulses.     Heart sounds: Normal heart sounds.  Pulmonary:     Effort: Pulmonary effort is normal.     Breath sounds: Normal breath sounds. No wheezing, rhonchi or rales.  Genitourinary:    Labia:        Right: No rash.        Left: No rash.      Comments: Normal external genitalia. No protrusion of bladder through vagina appreciated while supine or when standing.  Musculoskeletal:     Cervical back: Full passive range of motion without pain and normal range of motion. No pain with movement. Normal range of motion.  Lymphadenopathy:     Head:     Right side of head: Submandibular adenopathy present.     Cervical: Cervical adenopathy present.     Right cervical: Superficial cervical adenopathy present.     Comments: Nontender enlarged soft tissue area noted right submandibular and right superficial.  Thyoid unable to be palpated.   Skin:    General: Skin is warm and dry.  Neurological:     Mental Status: She is alert.  Psychiatric:        Speech: Speech normal.        Behavior: Behavior normal.        Thought Content: Thought content normal.        Assessment & Plan:   Problem List Items Addressed This Visit      Cardiovascular and Mediastinum   Abdominal aneurysm ([place])    Overdue for surveillance, I have call out to patient to schedule follow-up with Dr. Lucky Cowboy      Carotid stenosis, bilateral    Remains on aspirin, high-dose Lipitor.  Will follow      Essential  hypertension    Slightly elevated today. Have asked patient to monitor at home as would like more data prior to increasing therapy ( likely would increase lisinopril).         Immune and Lymphatic   Lymphadenopathy    Appreciable lymphadenopathy, pending CT head neck.  This  is of concern  in the setting of  dysphagia, smoking history.  After results,  will consult either ear nose and throat or GI      Relevant Orders   CT Soft Tissue Neck W Contrast     Other   Urinary incontinence - Primary    No appreciable prolapse on exam today.  Based on chronic nature of symptoms and how bothersome they are, think is very appropriate to re refer back to urology.  Referral has been placed.  Will follow      Relevant Orders   Ambulatory referral to Urology       I am having [name] maintain her aspirin EC, Centrum Adults, cholecalciferol, lisinopril, atorvastatin, and diltiazem.   No orders of the defined types were placed in this encounter.   Return precautions given.   Risks, benefits, and alternatives of the medications and treatment plan prescribed today were discussed, and patient expressed understanding.   Education regarding symptom management and diagnosis given to patient on AVS.  Continue to follow with Burnard Hawthorne, FNP for routine health maintenance.   Itxel M Kocsis and I agreed with plan.   [name], FNP

## 2019-10-14 NOTE — Patient Instructions (Signed)
Please review all information given regards to advanced directives.  Please call to schedule follow-up appointment so that we can discuss your preferences and plan for advanced directives in great detail.  Referral to urology to address incontinence. CT of head and neck  Let us know if you dont hear back within a week in regards to an appointment being scheduled.

## 2019-10-15 ENCOUNTER — Encounter: Payer: Self-pay | Admitting: Pulmonary Disease

## 2019-10-15 ENCOUNTER — Ambulatory Visit: Payer: Medicare Other | Admitting: Pulmonary Disease

## 2019-10-15 VITALS — BP 132/78 | HR 84 | Temp 97.8°F | Ht 65.5 in | Wt 157.6 lb

## 2019-10-15 DIAGNOSIS — R1313 Dysphagia, pharyngeal phase: Secondary | ICD-10-CM | POA: Diagnosis not present

## 2019-10-15 DIAGNOSIS — J449 Chronic obstructive pulmonary disease, unspecified: Secondary | ICD-10-CM

## 2019-10-15 DIAGNOSIS — R0602 Shortness of breath: Secondary | ICD-10-CM

## 2019-10-15 DIAGNOSIS — I350 Nonrheumatic aortic (valve) stenosis: Secondary | ICD-10-CM | POA: Diagnosis not present

## 2019-10-15 DIAGNOSIS — F17210 Nicotine dependence, cigarettes, uncomplicated: Secondary | ICD-10-CM

## 2019-10-15 MED ORDER — STIOLTO RESPIMAT 2.5-2.5 MCG/ACT IN AERS: 2.0000 | INHALATION_SPRAY | Freq: Every day | RESPIRATORY_TRACT | 0 refills | Status: DC

## 2019-10-15 NOTE — Progress Notes (Signed)
Subjective:    Patient ID: Tracy Bell, female    DOB: 1950/11/16, 69 y.o.   MRN: [id number]  HPI Tracy Bell is a 69 year old current smoker (2 PPD) who presents for follow-up on the issue of dyspnea and COPD by clinical impression.  She underwent PFTs on 06 Sep 2019.  FEV1 was 1.19 L which is 48% predicted FVC 1.85 L or 57% predicted with an FEV1/FVC of 64%.  There was no significant bronchodilator response.  Lung volumes were overall normal with evidence of air trapping with RV at 131%.  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  Patient presents today for follow-up, this was a scheduled visit.  We discussed the above pulmonary function testing.  She has had some issues with increasing dysphagia mostly pharyngeal.  She also has had some issues with reflux.  She also notices some "lumps in her neck", she is due to get a CT of the neck performed ordered by her primary care practitioner.  She has not had any hoarseness.  Dyspnea remains unchanged from prior.  Mostly on inclines and stairs.  She continues to have a dry cough in the mornings and the evenings with occasional pale yellow sputum production, no hemoptysis.  No chest pain, orthopnea, paroxysmal nocturnal dyspnea or lower extremity edema.  No other complaints voiced.  Review of Systems A 10 point review of systems was performed and it is as noted above otherwise negative.  No Known Allergies Current Meds  Medication Sig  . aspirin EC 81 MG tablet Take 1 tablet (81 mg total) by mouth daily.  Marland Kitchen atorvastatin (LIPITOR) 20 MG tablet Take 1 tablet (20 mg total) by mouth daily.  . cholecalciferol (VITAMIN D3) 25 MCG (1000 UT) tablet Take 1,000 Units by mouth daily.  Marland Kitchen diltiazem (CARDIZEM CD) 120 MG 24 hr capsule Take 1 capsule (120 mg total) by mouth daily.  Marland Kitchen lisinopril (ZESTRIL) 5 MG tablet TAKE 1 TABLET BY MOUTH EVERY DAY  . Multiple Vitamins-Minerals  (CENTRUM ADULTS) TABS Take 1 tablet by mouth daily.   Immunization History  Administered Date(s) Administered  . PFIZER SARS-COV-2 Vaccination 08/01/2019, 08/28/2019  . Pneumococcal Polysaccharide-23 07/13/2018  . Tdap 12/02/2014     Tobacco Use: High Risk  . Smoking Tobacco Use: Current Every Day Smoker  . Smokeless Tobacco Use: Never Used   Smoking 2 packs a day.      Objective:   Physical Exam BP 132/78 (BP Location: Left Arm, Cuff Size: Normal)   Pulse 84   Temp 97.8 F (36.6 C) (Temporal)   Ht 5' 5.5" (1.664 m)   Wt 157 lb 9.6 oz (71.5 kg)   SpO2 95%   BMI 25.83 kg/m   GENERAL: Awake, alert, fully ambulatory.  No respiratory distress, no use of accessories    HEAD: Normocephalic, atraumatic.  EYES: Pupils equal, round, reactive to light.  No scleral icterus.  MOUTH: Nose/mouth/throat not examined due to masking requirements for COVID 19. NECK: Supple. No thyromegaly. Trachea midline. No JVD.    Query shotty adenopathy on the right anterior cervical chain, no tenderness. PULMONARY: Good air entry bilaterally.  Scattered rhonchi noted bilaterally.  No wheezes.  No rales. CARDIOVASCULAR: S1 and S2. Regular rate and rhythm.  She has a harsh 2/6 to 3/6 murmur consistent with aortic stenosis.  No rubs or gallops noted. GASTROINTESTINAL: No distention noted. MUSCULOSKELETAL: No joint deformity, no clubbing, no edema.  NEUROLOGIC: Awake, alert, fully  oriented.  Fluent speech.  No overt focal deficit. SKIN: Intact,warm,dry. PSYCH: Mood and behavior appropriate.      Assessment & Plan:     ICD-10-CM   1. Stage 2 moderate COPD by GOLD classification ([place])  J44.9    She is at moderate to severe stage She has significant air trapping Trial of Stiolto 2 inhalations daily  2. SOB (shortness of breath)  R06.02    Dyspnea likely multifactorial COPD poorly compensated Aortic stenosis  3. Dysphagia, pharyngeal  R13.13    May be related to reflux Has CT neck ordered by  primary practitioner  4. Aortic stenosis, moderate  I35.0    This issue adds complexity to her management Followed by cardiology  5. Tobacco dependence due to cigarettes  F17.210    Counseled with regards to discontinuation of smoking 3 to 5 minutes She is already enrolled in lung cancer screening Low-dose CT chest due December 2021   Meds ordered this encounter  Medications  . Tiotropium Bromide-Olodaterol (STIOLTO RESPIMAT) 2.5-2.5 MCG/ACT AERS    Sig: Inhale 2 puffs into the lungs daily.    Dispense:  4 g    Refill:  0    Order Specific Question:   Lot Number?    Answer:   [id number]    Order Specific Question:   Expiration Date?    Answer:   04/01/2021    Order Specific Question:   Quantity    Answer:   1   Discussion:  Patient has evidence of moderate COPD by pulmonary function testing.  On my interpretation there is no evidence of restriction in her PFTs except for pseudorestriction due to air trapping.  Overall her lung volumes were normal.  FEV1 and FVC moderately reduced with moderate reduction in DLCO.  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  We will give at trial of Stiolto 2 inhalations daily to see if this alleviates her dyspnea.  Other factors adding to her dyspnea may be related to her aortic stenosis.  Patient also has noted worsening issues with dysphagia and reflux recommend trial of a PPI.  This is being worked up by her primary care practitioner.  She has CT neck pending.  We will see the patient in follow-up in 3 months time she is to contact us prior to that time should any new difficulties arise.   Renold Don, MD [facility]   *This note was dictated using voice recognition software/Dragon.  Despite best efforts to proofread, errors can occur which can change the meaning.  Any change was purely unintentional.

## 2019-10-15 NOTE — Patient Instructions (Signed)
We are to give you a trial of Stiolto 2 inhalations daily is an inhaler to help with your shortness of breath and air trapping  Please explore avenues for smoking cessation  We will see you in follow-up in 3 months time call sooner should any new difficulties arise.

## 2019-10-16 ENCOUNTER — Other Ambulatory Visit (INDEPENDENT_AMBULATORY_CARE_PROVIDER_SITE_OTHER): Payer: Self-pay | Admitting: Nurse Practitioner

## 2019-10-16 ENCOUNTER — Telehealth: Payer: Self-pay | Admitting: Family

## 2019-10-16 ENCOUNTER — Encounter: Payer: Self-pay | Admitting: Pulmonary Disease

## 2019-10-16 DIAGNOSIS — I713 Abdominal aortic aneurysm, ruptured, unspecified: Secondary | ICD-10-CM

## 2019-10-16 DIAGNOSIS — I714 Abdominal aortic aneurysm, without rupture, unspecified: Secondary | ICD-10-CM

## 2019-10-16 NOTE — Assessment & Plan Note (Signed)
Slightly elevated today. Have asked patient to monitor at home as would like more data prior to increasing therapy ( likely would increase lisinopril).

## 2019-10-16 NOTE — Telephone Encounter (Signed)
FYI Dr Lucky Cowboy- and hope you are well!   Sarah,   Call pt  In reviewing chart after this week's visit, she is overdue for follow up with Dr Lucky Cowboy for surveillance of abdominal  aneurysm; she had been due for ct scan in 04/2019. Please advise her to call his office and schedule an appt for surveillance/discussion of this.

## 2019-10-16 NOTE — Assessment & Plan Note (Signed)
Remains on aspirin, high-dose Lipitor.  Will follow

## 2019-10-16 NOTE — Assessment & Plan Note (Signed)
Overdue for surveillance, I have call out to patient to schedule follow-up with Dr. Lucky Cowboy

## 2019-10-16 NOTE — Assessment & Plan Note (Signed)
Appreciable lymphadenopathy, pending CT head neck.  This is of concern  in the setting of  dysphagia, smoking history.  After results,  will consult either ear nose and throat or GI

## 2019-10-16 NOTE — Telephone Encounter (Signed)
We were asked by patient's PCP to schedule CT scan for follow up of her aneurysm.  Patient was seen previously for this issue, Dr. Lucky Cowboy requested CT of abd/pelvis.  We contacted the patient to let her know, however she states that she doesn't remember any aneurysm.  We directed her to discuss with her PCP since that is who asked [place] to evaluate her.

## 2019-10-16 NOTE — Assessment & Plan Note (Signed)
No appreciable prolapse on exam today.  Based on chronic nature of symptoms and how bothersome they are, think is very appropriate to re refer back to urology.  Referral has been placed.  Will follow

## 2019-10-16 NOTE — Telephone Encounter (Signed)
Spoke with the patient as requested and gave the recommendation from Eulogio Ditch NP regarding a CT of the abdomen/pelvis per Dr. Lucky Cowboy. Patient stated she has nothing wrong with her abdomen and she should only be having a carotid CT due to her having had a CEA before with Dr. Lucky Cowboy. I advised the patient to call her PCP and discuss this. She stated she will.

## 2019-10-16 NOTE — Telephone Encounter (Signed)
Pt states that she does not need a CT on her abdomen. She states that she has never had anything wrong with her abdomen and is unsure why she had a referral sent in? Please advise

## 2019-10-16 NOTE — Telephone Encounter (Signed)
Tracy Bell.  Call pt Aneurysm was noted in 2019 on ct chest screen for lung cancer screen This is reason I referred her to dr dew in 2019.  She may not remember this  She also discussed this in detail with dr dew 04/2018 during an OV.   Please encourage to take call from Tracy Ditch NP from vascular as surveillance is important

## 2019-10-16 NOTE — Telephone Encounter (Signed)
Spoke with patient. She is going to call Dr Bunnie Domino office to get set up for CT ABD. She stated that she never knew that she had an abdominal aortic aneurysm. Advised that she was referred in Nov 2019 and the surveillance is important. Pt is going let me know if any problems.

## 2019-10-17 ENCOUNTER — Encounter: Payer: Self-pay | Admitting: Family

## 2019-10-17 ENCOUNTER — Telehealth (INDEPENDENT_AMBULATORY_CARE_PROVIDER_SITE_OTHER): Payer: Self-pay

## 2019-10-17 NOTE — Telephone Encounter (Signed)
Spoke with the patient yesterday and let her know an order for a CT abdomin/pelvis was placed for her. Patient wanted to talk to the  PCP and call back.  Patient called back and wanted more information about her CT. I advised that an order was placed for the CT and Radiology will be calling to schedule this appt.

## 2019-10-18 NOTE — Telephone Encounter (Signed)
Called [facility] out pt imaging for them to check on this and let me know what labs are needed. They are supposed to call me back because CT line was busy

## 2019-10-18 NOTE — Telephone Encounter (Signed)
Creatinine ordered by [facility]. My chart sent to pt

## 2019-10-18 NOTE — Telephone Encounter (Signed)
Creatinine order was placed by Eulogio Ditch, NP.

## 2019-10-21 ENCOUNTER — Other Ambulatory Visit (INDEPENDENT_AMBULATORY_CARE_PROVIDER_SITE_OTHER): Payer: Self-pay | Admitting: Nurse Practitioner

## 2019-10-23 ENCOUNTER — Encounter: Payer: Self-pay | Admitting: Physician Assistant

## 2019-10-23 ENCOUNTER — Ambulatory Visit: Payer: Medicare Other | Admitting: Physician Assistant

## 2019-10-23 ENCOUNTER — Other Ambulatory Visit: Payer: Self-pay

## 2019-10-23 VITALS — BP 140/80 | HR 74 | Ht 64.0 in | Wt 158.0 lb

## 2019-10-23 DIAGNOSIS — J449 Chronic obstructive pulmonary disease, unspecified: Secondary | ICD-10-CM

## 2019-10-23 DIAGNOSIS — I471 Supraventricular tachycardia: Secondary | ICD-10-CM | POA: Diagnosis not present

## 2019-10-23 DIAGNOSIS — E785 Hyperlipidemia, unspecified: Secondary | ICD-10-CM | POA: Diagnosis not present

## 2019-10-23 MED ORDER — STIOLTO RESPIMAT 2.5-2.5 MCG/ACT IN AERS: 2.0000 | INHALATION_SPRAY | Freq: Every day | RESPIRATORY_TRACT | 2 refills | Status: DC

## 2019-10-23 MED ORDER — ATORVASTATIN CALCIUM 40 MG PO TABS: 40.0000 mg | ORAL_TABLET | Freq: Every day | ORAL | 3 refills | Status: DC

## 2019-10-23 NOTE — Patient Instructions (Signed)
Medication Instructions:   Your physician has recommended you make the following change in your medication:   INCREASE your Atorvastatin (Lipitor) to 40mg , One tablet by mouth daily.  Call us if your BP remains elevated above 130/80.  *If you need a refill on your cardiac medications before your next appointment, please call your pharmacy*   Lab Work: None Ordered. If you have labs (blood work) drawn today and your tests are completely normal, you will receive your results only by: Marland Kitchen MyChart Message (if you have MyChart) OR . A paper copy in the mail If you have any lab test that is abnormal or we need to change your treatment, we will call you to review the results.   Testing/Procedures: None Ordered.   Follow-Up: At [facility], you and your health needs are our priority.  As part of our continuing mission to provide you with exceptional heart care, we have created designated Provider Care Teams.  These Care Teams include your primary Cardiologist (physician) and Advanced Practice Providers (APPs -  Physician Assistants and Nurse Practitioners) who all work together to provide you with the care you need, when you need it.  We recommend signing up for the patient portal called "MyChart".  Sign up information is provided on this After Visit Summary.  MyChart is used to connect with patients for Virtual Visits (Telemedicine).  Patients are able to view lab/test results, encounter notes, upcoming appointments, etc.  Non-urgent messages can be sent to your provider as well.   To learn more about what you can do with MyChart, go to [email or web address].    Your next appointment:   2 month(s)  The format for your next appointment:   In Person  Provider:    You may see No primary care provider on file. or one of the following Advanced Practice Providers on your designated Care Team:    Murray Hodgkins, NP  Christell Faith, PA-C  [name], PA-C  Laurann Montana,  NP    Other Instructions  N/A

## 2019-10-23 NOTE — Telephone Encounter (Signed)
Ok to refill [employer]

## 2019-10-23 NOTE — Progress Notes (Addendum)
Office Visit    Patient Name: Tracy Bell Date of Encounter: 10/23/2019  Primary Care Provider:  Burnard Hawthorne, FNP Primary Cardiologist:  Dr. Fletcher Anon  Chief Complaint    Chief Complaint  Patient presents with  . office visit    2 week F/U; Meds verbally reviewed with patient.   69 year old female with history of SVT,  Mobitz I (Wenckebach), COPD, hyperlipidemia, aortic stenosis, hypertension, HLD, carotid disease s/p right carotid endarterectomy (07/2018), tobacco use (1-2 packs daily), and seen today for follow-up.  Past Medical History    Past Medical History:  Diagnosis Date  . Anxiety   . COPD (chronic obstructive pulmonary disease) ([place])   . Cough   . Depression   . Dizziness   . Dyspnea   . Heart murmur   . Hyperlipidemia   . Hypertension   . Lightheadedness   . Personal history of tobacco use, presenting hazards to health 08/28/2015   Past Surgical History:  Procedure Laterality Date  . ABDOMINAL HYSTERECTOMY  1980   complete  . BREAST BIOPSY  1970   normal  . DILATION AND CURETTAGE OF UTERUS    . ENDARTERECTOMY Right 07/12/2018   Procedure: ENDARTERECTOMY CAROTID;  Surgeon: Algernon Huxley, MD;  Location: [facility] ORS;  Service: Vascular;  Laterality: Right;  . TUBAL LIGATION      Allergies  No Known Allergies  History of Present Illness    Tracy Bell is a 69 y.o. female with PMH as above.  She was seen in the past by Dr. Saralyn Pilar. Previous CT scan showed evidence of CAC. She underwent a stress echo in 12/2015, during which she exercised for 4 minutes and 36 seconds without CP or EKG changes. Echo showed nl LVSF with mild AS and no evidence of ischemia. She reported palpitations in 2019 with 72 hour monitor performed and showing NSR with frequent PACs with occasional brief atrial runs, the longest of which lasted 7 beats. 04/2018 echo showed nl LVSF, mild to moderate AS with mean gradient 46mHg and valve area 1.16. At her outpatient visit 05/17/19,  she reported intermittent palpitations and tachycardia with episodes that occurred every two months to twice in one week. She reported feeling the onset of faster heartbeat with pulsation on the right side of her neck. Associated sx included mild dizziness, diaphoresis, and mild SOB. She continued to smoke between 1-2 pks of cigarettes daily. Given her sx, recommendations were for 2 week outpatient monitor. A follow-up echo was also ordered. Zio results showed 100 SVT runs, with the fastest lasting 1 min 26 seconds with max rate of 197bpm and the longest 50 min and 54 seconds with average rate of 144 bm. The MD notification criteria for SVT was met. Also noted was second degree AVB - Mobitz I (Wenckebach). Updated echo as below showed EF 60-65%, G2DD, mild TR, mild to moderate AS with mean gradient 151mg and valve area 1.4cm2. Mildly elevated PASP was also noted with RVSP 32.63m37m.  When seen in clinic in February, she noted ongoing racing HR and palpitations with intermittent presyncope and diaphoresis. She noted R sided neck pulsation. She also felt occasional nausea or as if something was pressing on the front of her throat. She was still smoking. She occasionally added salt to her food. Zio was reviewed and showed SVT with recommendation to switch her amlodipine to diltiazem but deferred. BB was thought less ideal 2/2 pulmonary dz. Possible EP referral was discussed and deferred. BP elevated with  amlodipine increased to 81m daily. She was agreeable to transition to diltiazem once out of her amlodipine which was just refilled.  It was noted that increased statin and ACE dose could be considered at RTC. As Mobitz I was also noted on her Zio in the early hours of the morning, recommendation was for sleep study but deferred. Smoking cessation was also advised.   After increase of amlodipine, BP improved to 126/80.  She still noted spells consistent with that of her SVT.  She occasionally felt presyncope / SOB  / racing HR / high chest/neck discomfort at this time of her SVT. Recommendation was for diltiazem for SVT with patient agreeable to transition before running out of her medicine.  Information was requested on SVT and again provided in her AVS.  Given the start of low dose of diltiazem, we discussed recommendation she monitor her BP closely to ensure that it remains controlled with this medication transition.She reported she drank some caffeine but only in the AM (son bought her a [place]).  She was going to start to monitor if her SVT sx corresponded with her caffeine intake. No alcohol.  We discussed possible claudication / vascular sx and the recommendation to escalate to Lipitor 878mand smoking cessation. She has tried Wellbutrin 300 mg in the past without success.  Today, she returns to clinic for follow-up.  She reports ongoing symptoms as outlined above with spells of SVT.  She reports that she feels faint / dizzy with the past her episodes.  This occurs once or twice a week or every other week.  Sometimes, she can go 3 to 4 weeks without symptoms.  She brings with her a detailed blood pressure and heart rate notebook, which shows episodes of elevated heart rate and blood pressure, as well as lower pressures and HR.  She does not drink alcohol and is working on quitting smoking. She reports drinking decaf coffee and tea, at times mixed with regular.  She denies any chest pains.  She has recently noted symptoms consistent with esophageal stricture, during which time she feels as if she gets both liquid and food stuck in her throat.  She also reports that she is being followed for growths in her neck, which is on the R and more sensitive at night.  EKG today politely declined.  She has not yet increased Lipitor but agreeable to do so. Echo results reviewed.    Home Medications    Prior to Admission medications   Medication Sig Start Date End Date Taking? Authorizing Provider  aspirin EC 81 MG  tablet Take 1 tablet (81 mg total) by mouth daily. 03/14/14   WaJackolyn ConferMD  atorvastatin (LIPITOR) 10 MG tablet TAKE 1 TABLET BY MOUTH EVERY DAY 04/23/19   ArBurnard HawthorneFNP  cholecalciferol (VITAMIN D3) 25 MCG (1000 UT) tablet Take 1,000 Units by mouth daily.    [provider]  diltiazem (CARDIZEM CD) 120 MG 24 hr capsule Take 1 capsule (120 mg total) by mouth daily. 06/19/19 09/17/19  ViMarrianne Mood, PA-C  lisinopril (ZESTRIL) 5 MG tablet TAKE 1 TABLET BY MOUTH EVERY DAY 05/28/19   ArBurnard HawthorneFNP  Multiple Vitamins-Minerals (CENTRUM ADULTS) TABS Take 1 tablet by mouth daily. 03/14/14   WaJackolyn ConferMD    Review of Systems    She denies dyspnea, pnd, orthopnea, n, v, syncope, edema, or early satiety. She reports weight gain 2/2 caloric intake with clinic weight down from previous.  She reports continued episodes of right neck pulsation, pressure on the front of her throat/high neck, shortness of breath at rest during these episodes.  She also occasionally notices dizziness with these episodes.  She notes bilateral lower extremity weakness and coldness as above.  All other systems reviewed and are otherwise negative except as noted above.  Physical Exam    VS:  BP 140/80 (BP Location: Left Arm, Patient Position: Sitting, Cuff Size: Normal)   Pulse 74   Ht '5\' 4"'  (1.626 m)   Wt 158 lb (71.7 kg)   SpO2 92%   BMI 27.12 kg/m  , BMI Body mass index is 27.12 kg/m. GEN: Well nourished, well developed, in no acute distress. HEENT: normal. Neck: Supple, no JVD, carotid bruits, or masses. Cardiac: RRR, 3/6 systolic murmur. No rubs or gallops. No clubbing, cyanosis, edema.  Radials 2+ /DP/PT 1+ bilaterally.  Respiratory:  Coarse breath sounds bilaterally. GI: Soft, nontender, nondistended, BS + x 4. MS: no deformity or atrophy. Skin: warm and dry, no rash. Neuro:  Strength and sensation are intact. Psych: Normal affect.  Accessory Clinical Findings     ECG personally reviewed by me today - NSR 74bpm- no acute changes.  VITALS Reviewed today   Temp Readings from Last 3 Encounters:  10/15/19 97.8 F (36.6 C) (Temporal)  10/14/19 (!) 97.2 F (36.2 C)  08/29/19 98.4 F (36.9 C) (Temporal)   BP Readings from Last 3 Encounters:  10/23/19 140/80  10/15/19 132/78  10/14/19 130/88   Pulse Readings from Last 3 Encounters:  10/23/19 74  10/15/19 84  10/14/19 80    Wt Readings from Last 3 Encounters:  10/23/19 158 lb (71.7 kg)  10/15/19 157 lb 9.6 oz (71.5 kg)  10/14/19 156 lb 6.4 oz (70.9 kg)     LABS  reviewed today   [place] present? Yes/No: No not current  Lab Results  Component Value Date   WBC 11.3 (H) 07/27/2018   HGB 15.2 (H) 07/27/2018   HCT 44.6 07/27/2018   MCV 92.1 07/27/2018   PLT 279.0 07/27/2018   Lab Results  Component Value Date   CREATININE 0.89 05/15/2019   BUN 15 05/15/2019   NA 140 05/15/2019   K 4.4 05/15/2019   CL 103 05/15/2019   CO2 30 05/15/2019   Lab Results  Component Value Date   ALT 19 05/15/2019   AST 21 05/15/2019   ALKPHOS 74 05/15/2019   BILITOT 0.8 05/15/2019   Lab Results  Component Value Date   CHOL 167 05/15/2019   HDL 58.10 05/15/2019   LDLCALC 90 05/15/2019   TRIG 93.0 05/15/2019   CHOLHDL 3 05/15/2019    Lab Results  Component Value Date   HGBA1C 5.8 03/23/2018   Lab Results  Component Value Date   TSH 3.05 03/23/2018     STUDIES/PROCEDURES reviewed today   Zio Predominantly NSR, HR 25-197 with average HR 65bpm. 100 SVT runs occurred. The run with the fastest interval lasted 1 minute and 26 seconds with maximum rate of 197 bpm, the longest lasting 50 min 54 seconds with an average rate of 144bpm. Second degree AVB Mobitz (Wenckebach) was present. SVT was detected within +- 45 seconds of symptomatic patient events. Isolated SVE were rare (<1.0%), SVE Couplets were rare (<1.0%), and SVE Triplets were rare (<1.0%). Isolated VE were rare (<1.0%), VE  Couplets were rare (<1.0%), and no VE Triplets were present. MD notification for SVT met.   Echo 06/05/19 1. Left ventricular ejection fraction,  by visual estimation, is 60 to  65%. The left ventricle has normal function. There is borderline left  ventricular hypertrophy.  2. Left ventricular diastolic parameters are consistent with Grade II  diastolic dysfunction (pseudonormalization).  3. The left ventricle has no regional wall motion abnormalities.  4. Global right ventricle has normal systolic function.The right  ventricular size is normal. Right vetricular wall thickness was not  assessed.  5. Left atrial size was normal.  6. Right atrial size was normal.  7. The mitral valve is normal in structure. No evidence of mitral valve  regurgitation.  8. The tricuspid valve is normal in structure.  9. The tricuspid valve is normal in structure. Tricuspid valve  regurgitation is mild.  10. The aortic valve is abnormal. Aortic valve regurgitation is not  visualized. Mild to moderate aortic valve stenosis.  11. Peak AV gradient 40mHg, MG 135mg, DVI 0.45, with LVOT diameter of  2cm, AVA 1.4cm2.  12. The pulmonic valve was not well visualized. Pulmonic valve  regurgitation is not visualized.  13. Mildly elevated pulmonary artery systolic pressure.  14. The inferior vena cava is normal in size with greater than 50%  respiratory variability, suggesting right atrial pressure of 3 mmHg.    Assessment & Plan    SVT --Continues to experience sx on diltiazem 12064mDiscussed possibly increasing to higher dose in the future with patient preference to defer for now to avoid multiple changing medications. As above, she also currently has significant sx of dysphagia and possible esophageal stricture and prefers to defer medication changes until after this is resolved. Discussed ablation as well / EP referral with patient preference to defer and continue the diltiazem. We will plan to  escalate diltiazem if needed for optimal sx and BP control at RTC.  She plans to continue to monitor her sx and BP/HR.   Mobitz I (Wenckebach) Possible sleep apnea --We discussed the recommendation for a sleep study, given her  Mobitz I (Wenckebach) in the early hours of the morning and while asleep. She prefers to defer.  Pulmonology referral provided last visit with notes as per EMR regarding COPD.  Coronary artery disease involving the native coronary arteries on CT Aortic atherosclerosis on CT Bilateral lower extremity leg weakness, coldness, calf pain, reduced pulses --Previous CT scan of the lungs showed aortic atherosclerosis and coronary atherosclerosis.  She does have significant risk factors for coronary artery disease, including current smoker. Agreeable to high intensity statin at atorvastatin 5m41m today. Continue ASA. No s/sx of bleeding. No current angina or concerning sx. No current plan for further ischemic work-up.  Risk factor modification discussed / lifestyle changes.  Smoking cessation encouraged with patient working towards this goal.  HLD --As above. Recommendation is for Lipitor 5mg27mly with patient agreeable.  LDL goal <70. Continue ASA. Lifestyle changes discussed, including smoking cessation. Will recheck lipids and liver in 6-8 weeks.   AS, mild to moderate --Aortic valve with mild moderate stenosis as updated echo as above.  Discussed that this may contribute to her chronic sx as above. Continue to monitor with periodic echo and instructed her to call if any signs or symptoms of worsening valvular disease.   Essential hypertension, poorly controlled --Blood pressure improved but still sub-optimal. At RTC, consider escalation of lisinopril +/- Cardizem if needed. Low-salt diet discussed, as well as ongoing caffeine and attempts at reduced nicotine.  Discussed recommendation for sleep study - defer for now per patient. Increase activity as tolerated.  Ascending aortic aneurysm, previously noted on CT 05/17/18 --Previous 1/16 CT scan noted 4.3 cm ascending aortic aneurysm, not noted on most recent echo or 04/2019 CT.  Recommendation is for ongoing heart rate and blood pressure control. Agreeable to increase to Lipitor 27m daily to continue with ASA.  Smoking cessation advised. Avoid heavy lifting and FQ.   Carotid artery disease s/p R carotid endarterectomy --Followed by vascular surgery.  No bruit heard on exam.  No reported amaurosis fugax. Smoking cessation, as well as BP / heart rate control.  Discussed increased dose of statin with pt agreeable to escalate to Lipitor 88m Continue ASA.  Risk factor modification recommended.  Current smoker (1/2-1 packs / day) Nodule of the left lung --L lung nodule as directly below. Discussed pulmonology recommendations or A/P after referral at previous visit. Per 04/02/2019 CT, lung RADS 2 with benign appearance of behavior.Recommendation on CT scan report to continue annual screening with low-dose chest CT without contrast every 12 months.  Medication changes: Increase to Lipitor 8056maily. Continue diltiazem 120 mg for now and recommend consider increase to 240m42m RTC if room in HR / BP versus lisinopril for BP support.  Labs ordered: None. Preference is to defer. Recheck lipids and LFTs in 6-8 weeks if agreeable. Studies / Imaging ordered: None. Future considerations: [facility]  Check BP on diltiazem and escalate if agreeable. ?Smoking cessation.  If BP indicates, could also consider increase of lisinopril in addition to diltiazem if indicated. Disposition: RTC 2 months or sooner if needed.     JacqArvil Chaco-C 10/23/2019

## 2019-10-24 ENCOUNTER — Encounter: Payer: Self-pay | Admitting: Physician Assistant

## 2019-10-27 MED ORDER — ATORVASTATIN CALCIUM 40 MG PO TABS: 40.0000 mg | ORAL_TABLET | Freq: Two times a day (BID) | ORAL | 3 refills | Status: DC

## 2019-10-28 ENCOUNTER — Ambulatory Visit: Payer: Medicare Other

## 2019-10-28 ENCOUNTER — Ambulatory Visit
Admission: RE | Admit: 2019-10-28 | Discharge: 2019-10-28 | Disposition: A | Discharge status: Home / Self Care | Payer: Medicare Other | Source: Ambulatory Visit | Attending: Nurse Practitioner | Admitting: Nurse Practitioner

## 2019-10-28 ENCOUNTER — Ambulatory Visit
Admission: RE | Admit: 2019-10-28 | Discharge: 2019-10-28 | Disposition: A | Discharge status: Home / Self Care | Payer: Medicare Other | Source: Ambulatory Visit | Attending: Family | Admitting: Family

## 2019-10-28 ENCOUNTER — Other Ambulatory Visit: Payer: Self-pay

## 2019-10-28 DIAGNOSIS — I714 Abdominal aortic aneurysm, without rupture: Secondary | ICD-10-CM | POA: Diagnosis not present

## 2019-10-28 DIAGNOSIS — R591 Generalized enlarged lymph nodes: Secondary | ICD-10-CM | POA: Diagnosis not present

## 2019-10-28 DIAGNOSIS — R599 Enlarged lymph nodes, unspecified: Secondary | ICD-10-CM | POA: Diagnosis not present

## 2019-10-28 DIAGNOSIS — K8020 Calculus of gallbladder without cholecystitis without obstruction: Secondary | ICD-10-CM | POA: Diagnosis not present

## 2019-10-28 DIAGNOSIS — I713 Abdominal aortic aneurysm, ruptured, unspecified: Secondary | ICD-10-CM

## 2019-10-28 LAB — POCT I-STAT CREATININE: Creatinine, Ser: 0.9 mg/dL (ref 0.44–1.00)

## 2019-10-28 MED ORDER — IOHEXOL 350 MG/ML SOLN
100.0000 mL | Freq: Once | INTRAVENOUS | Status: AC | PRN
  Administered 2019-10-28: 100 mL via INTRAVENOUS

## 2019-10-28 NOTE — Progress Notes (Signed)
Can you call patient to schedule CT review with Dr. Lucky Cowboy?

## 2019-10-29 ENCOUNTER — Ambulatory Visit (INDEPENDENT_AMBULATORY_CARE_PROVIDER_SITE_OTHER): Payer: Medicare Other | Admitting: Vascular Surgery

## 2019-10-29 ENCOUNTER — Encounter (INDEPENDENT_AMBULATORY_CARE_PROVIDER_SITE_OTHER): Payer: Self-pay | Admitting: Vascular Surgery

## 2019-10-29 ENCOUNTER — Encounter: Payer: Self-pay | Admitting: Family

## 2019-10-29 VITALS — BP 134/79 | HR 71 | Ht 64.0 in | Wt 158.0 lb

## 2019-10-29 DIAGNOSIS — I714 Abdominal aortic aneurysm, without rupture, unspecified: Secondary | ICD-10-CM

## 2019-10-29 DIAGNOSIS — F17200 Nicotine dependence, unspecified, uncomplicated: Secondary | ICD-10-CM

## 2019-10-29 DIAGNOSIS — I10 Essential (primary) hypertension: Secondary | ICD-10-CM | POA: Diagnosis not present

## 2019-10-29 DIAGNOSIS — I6523 Occlusion and stenosis of bilateral carotid arteries: Secondary | ICD-10-CM | POA: Diagnosis not present

## 2019-10-29 NOTE — Assessment & Plan Note (Signed)
Represents an atherosclerotic risk factor for progression of her carotid disease as well as aneurysmal growth.

## 2019-10-29 NOTE — Assessment & Plan Note (Signed)
Her thoracic aorta measures 3.9 cm which is the upper limits of normal.  This can be checked every few years with a CT scan.  Her abdominal aortic aneurysm measures approximately 3.2 cm in maximal diameter and should be checked in 1 year with a duplex.  Blood pressure control and tobacco cessation would be of benefit to avoid aneurysm growth.

## 2019-10-29 NOTE — Assessment & Plan Note (Signed)
blood pressure control important in reducing the progression of atherosclerotic disease. On appropriate oral medications.  

## 2019-10-29 NOTE — Patient Instructions (Signed)
Abdominal Aortic Aneurysm  An aneurysm is a bulge in one of the blood vessels that carry blood away from the heart (artery). It happens when blood pushes up against a weak or damaged place in the wall of an artery. An abdominal aortic aneurysm happens in the main artery of the body (aorta). Some aneurysms may not cause problems. If it grows, it can burst or tear, causing bleeding inside the body. This is an emergency. It needs to be treated right away. What are the causes? The exact cause of this condition is not known. What increases the risk? The following may make you more likely to get this condition:  Being a female who is 60 years of age or older.  Being white (Caucasian).  Using tobacco.  Having a family history of aneurysms.  Having the following conditions: ? Hardening of the arteries (arteriosclerosis). ? Inflammation of the walls of an artery (arteritis). ? Certain genetic conditions. ? Being very overweight (obesity). ? An infection in the wall of the aorta (infectious aortitis). ? High cholesterol. ? High blood pressure (hypertension). What are the signs or symptoms? Symptoms depend on the size of the aneurysm and how fast it is growing. Most grow slowly and do not cause any symptoms. If symptoms do occur, they may include:  Pain in the belly (abdomen), side, or back.  Feeling full after eating only small amounts of food.  Feeling a throbbing lump in the belly. Symptoms that the aneurysm has burst (ruptured) include:  Sudden, very bad pain in the belly, side, or back.  Feeling sick to your stomach (nauseous).  Throwing up (vomiting).  Feeling light-headed or passing out. How is this treated? Treatment for this condition depends on:  The size of the aneurysm.  How fast it is growing.  Your age.  Your risk of having it burst. If your aneurysm is smaller than 2 inches (5 cm), your doctor may manage it by:  Checking it often to see if it is getting bigger.  You may have an imaging test (ultrasound) to check it every 3-6 months, every year, or every few years.  Giving you medicines to: ? Control blood pressure. ? Treat pain. ? Fight infection. If your aneurysm is larger than 2 inches (5 cm), you may need surgery to fix it. Follow these instructions at home: Lifestyle  Do not use any products that have nicotine or tobacco in them. This includes cigarettes, e-cigarettes, and chewing tobacco. If you need help quitting, ask your doctor.  Get regular exercise. Ask your doctor what types of exercise are best for you. Eating and drinking  Eat a heart-healthy diet. This includes eating plenty of: ? Fresh fruits and vegetables. ? Whole grains. ? Low-fat (lean) protein. ? Low-fat dairy products.  Avoid foods that are high in saturated fat and cholesterol. These foods include red meat and some dairy products.  Do not drink alcohol if: ? Your doctor tells you not to drink. ? You are pregnant, may be pregnant, or are planning to become pregnant.  If you drink alcohol: ? Limit how much you use to:  0-1 drink a day for women.  0-2 drinks a day for men. ? Be aware of how much alcohol is in your drink. In the [place] one drink equals any of these:  One typical bottle of beer (12 oz).  One-half glass of wine (5 oz).  One shot of hard liquor (1 oz). General instructions  Take over-the-counter and prescription medicines only as   told by your doctor.  Keep your blood pressure within normal limits. Ask your doctor what your blood pressure should be.  Have your blood sugar (glucose) level and cholesterol levels checked regularly. Keep your blood sugar level and cholesterol levels within normal limits.  Avoid heavy lifting and activities that take a lot of effort. Ask your doctor what activities are safe for you.  Keep all follow-up visits as told by your doctor. This is important. ? Talk to your doctor about regular screenings to see if the  aneurysm is getting bigger. Contact a doctor if you:  Have pain in your belly, side, or back.  Have a throbbing feeling in your belly.  Have a family history of aneurysms. Get help right away if you:  Have sudden, bad pain in your belly, side, or back.  Feel sick to your stomach.  Throw up.  Have trouble pooping (constipation).  Have trouble peeing (urinating).  Feel light-headed.  Have a fast heart rate when you stand.  Have sweaty skin that is cold to the touch (clammy).  Have shortness of breath.  Have a fever. These symptoms may be an emergency. Do not wait to see if the symptoms will go away. Get medical help right away. Call your local emergency services (911 in the [place] Do not drive yourself to the hospital. Summary  An aneurysm is a bulge in one of the blood vessels that carry blood away from the heart (artery). Some aneurysms may not cause problems.  You may need to have yours checked often. If it grows, it can burst or tear. This causes bleeding inside the body. It needs to be treated right away.  Follow instructions from your doctor about healthy lifestyle changes.  Keep all follow-up visits as told by your doctor. This is important. This information is not intended to replace advice given to you by your health care provider. Make sure you discuss any questions you have with your health care provider. Document Revised: 08/06/2018 Document Reviewed: 11/25/2017 Elsevier Patient Education  2020 [employer]  

## 2019-10-29 NOTE — Progress Notes (Signed)
MRN : [id number]  Tracy Bell is a 69 y.o. ([birth year]) female who presents with chief complaint of  Chief Complaint  Patient presents with  . Follow-up    CT results  .  History of Present Illness: Patient returns today in follow up of multiple vascular issues.  She had a carotid endarterectomy about 15 months ago just before Covid, and did not return to follow-up for a variety of reasons.  She did have a CT of the neck recently performed which showed no obvious abnormalities with typical postoperative changes.  She is not had any focal neurologic symptoms.  She has had a couple of nodules on her neck which likely represents scar tissue lymph nodes on exam and are certainly not worrisome.  She is disappointed that she still has some numbness around her scar but that is typical for post carotid endarterectomy. She was also found to have thoracic and abdominal aortic abnormalities on CT scans.  I have independently reviewed the CT scans and she had a CT scan of the chest in December 2020 which showed a 3.9 cm ascending thoracic aorta which would represent the upper limits of normal.  Her abdominal aorta measured 3.2 cm which is mildly aneurysmal.  That scan was from this week.  She has no current aneurysm related symptoms. Specifically, the patient denies new back or abdominal pain, or signs of peripheral embolization   Current Outpatient Medications  Medication Sig Dispense Refill  . aspirin EC 81 MG tablet Take 1 tablet (81 mg total) by mouth daily. 30 tablet 6  . atorvastatin (LIPITOR) 40 MG tablet Take 1 tablet (40 mg total) by mouth 2 (two) times daily. 90 tablet 3  . cholecalciferol (VITAMIN D3) 25 MCG (1000 UT) tablet Take 1,000 Units by mouth daily.    Marland Kitchen diltiazem (CARDIZEM CD) 120 MG 24 hr capsule Take 1 capsule (120 mg total) by mouth daily. 90 capsule 0  . lisinopril (ZESTRIL) 5 MG tablet TAKE 1 TABLET BY MOUTH EVERY DAY 90 tablet 3  . Multiple Vitamins-Minerals (CENTRUM  ADULTS) TABS Take 1 tablet by mouth daily. 30 tablet 6  . Tiotropium Bromide-Olodaterol (STIOLTO RESPIMAT) 2.5-2.5 MCG/ACT AERS Inhale 2 puffs into the lungs daily. 4 g 2   No current facility-administered medications for this visit.    Past Medical History:  Diagnosis Date  . Anxiety   . COPD (chronic obstructive pulmonary disease) ([place])   . Cough   . Depression   . Dizziness   . Dyspnea   . Heart murmur   . Hyperlipidemia   . Hypertension   . Lightheadedness   . Personal history of tobacco use, presenting hazards to health 08/28/2015    Past Surgical History:  Procedure Laterality Date  . ABDOMINAL HYSTERECTOMY  1980   complete  . BREAST BIOPSY  1970   normal  . DILATION AND CURETTAGE OF UTERUS    . ENDARTERECTOMY Right 07/12/2018   Procedure: ENDARTERECTOMY CAROTID;  Surgeon: Algernon Huxley, MD;  Location: [facility] ORS;  Service: Vascular;  Laterality: Right;  . TUBAL LIGATION       Social History   Tobacco Use  . Smoking status: Current Every Day Smoker    Packs/day: 2.00    Years: 60.00    Pack years: 120.00    Start date: 05/03/1963  . Smokeless tobacco: Never Used  Vaping Use  . Vaping Use: Never used  Substance Use Topics  . Alcohol use: Yes    Alcohol/week: 0.0  standard drinks    Comment: occasionally  . Drug use: No     Family History  Problem Relation Age of Onset  . Cancer Mother        Pancreatic Cancer   . Cancer Father        Stomach Cancer   . Lung cancer Sister   . Cancer Sister        ovarian  . Lung cancer Sister     No Known Allergies   REVIEW OF SYSTEMS (Negative unless checked)  Constitutional: [] Weight loss  [] Fever  [] Chills Cardiac: [] Chest pain   [] Chest pressure   [] Palpitations   [] Shortness of breath when laying flat   [] Shortness of breath at rest   [] Shortness of breath with exertion. Vascular:  [] Pain in legs with walking   [] Pain in legs at rest   [] Pain in legs when laying flat   [] Claudication   [] Pain in feet when  walking  [] Pain in feet at rest  [] Pain in feet when laying flat   [] History of DVT   [] Phlebitis   [] Swelling in legs   [] Varicose veins   [] Non-healing ulcers Pulmonary:   [] Uses home oxygen   [] Productive cough   [] Hemoptysis   [] Wheeze  [x] COPD   [] Asthma Neurologic:  [x] Dizziness  [] Blackouts   [] Seizures   [] History of stroke   [] History of TIA  [] Aphasia   [] Temporary blindness   [] Dysphagia   [] Weakness or numbness in arms   [] Weakness or numbness in legs Musculoskeletal:  [x] Arthritis   [] Joint swelling   [] Joint pain   [] Low back pain Hematologic:  [] Easy bruising  [] Easy bleeding   [] Hypercoagulable state   [] Anemic   Gastrointestinal:  [] Blood in stool   [] Vomiting blood  [] Gastroesophageal reflux/heartburn   [] Abdominal pain Genitourinary:  [] Chronic kidney disease   [] Difficult urination  [] Frequent urination  [] Burning with urination   [] Hematuria Skin:  [] Rashes   [] Ulcers   [] Wounds Psychological:  [x] History of anxiety   [x]  History of major depression.  Physical Examination  BP 134/79   Pulse 71   Ht 5\' 4"  (1.626 m)   Wt 158 lb (71.7 kg)   BMI 27.12 kg/m  Gen:  WD/WN, NAD Head: [place]/AT, No temporalis wasting. Ear/Nose/Throat: Hearing grossly intact, nares w/o erythema or drainage Eyes: Conjunctiva clear. Sclera non-icteric Neck: Supple.  Trachea midline.  Carotid incision is well-healed with few shotty lymph nodes but nothing worrisome. Pulmonary:  Good air movement, no use of accessory muscles.  Cardiac: RRR, no JVD Vascular:  Vessel Right Left  Radial Palpable Palpable                                   Gastrointestinal: soft, non-tender/non-distended. No guarding/reflex.  Musculoskeletal: M/S 5/5 throughout.  No deformity or atrophy.  No significant lower extremity edema. Neurologic: Sensation grossly intact in extremities.  Symmetrical.  Speech is fluent.  Psychiatric: Judgment intact, Mood & affect appropriate for pt's clinical situation. Dermatologic: No  rashes or ulcers noted.  No cellulitis or open wounds.       Labs Recent Results (from the past 2160 hour(s))  SARS CORONAVIRUS 2 (TAT 6-24 HRS) Nasopharyngeal Nasopharyngeal Swab     Status: None   Collection Time: 09/05/19 10:09 AM   Specimen: Nasopharyngeal Swab  Result Value Ref Range   SARS Coronavirus 2 NEGATIVE NEGATIVE    Comment: (NOTE) SARS-CoV-2 target nucleic acids are NOT DETECTED.  The SARS-CoV-2 RNA is generally detectable in upper and lower respiratory specimens during the acute phase of infection. Negative results do not preclude SARS-CoV-2 infection, do not rule out co-infections with other pathogens, and should not be used as the sole basis for treatment or other patient management decisions. Negative results must be combined with clinical observations, patient history, and epidemiological information. The expected result is Negative. Fact Sheet for Patients: SugarRoll.be Fact Sheet for Healthcare Providers: https://www.woods-mathews.com/ This test is not yet approved or cleared by the [place] FDA and  has been authorized for detection and/or diagnosis of SARS-CoV-2 by FDA under an Emergency Use Authorization (EUA). This EUA will remain  in effect (meaning this test can be used) for the duration of the COVID-19 declaration under Section 56 4(b)(1) of the Act, 21 U.S.C. section 360bbb-3(b)(1), unless the authorization is terminated or revoked sooner. Performed at [facility], [place] [address] [place], [place] [address]   I-STAT creatinine     Status: None   Collection Time: 10/28/19 10:15 AM  Result Value Ref Range   Creatinine, Ser 0.90 0.44 - 1.00 mg/dL    Radiology CT Soft Tissue Neck W Contrast  Result Date: 10/28/2019 CLINICAL DATA:  Lymphadenopathy. Right submandibular. Right superficial cervical. EXAM: CT NECK WITH CONTRAST TECHNIQUE: Multidetector CT imaging of the neck was performed using the  standard protocol following the bolus administration of intravenous contrast. CONTRAST:  133mL OMNIPAQUE IOHEXOL 350 MG/ML SOLN COMPARISON:  CT angiogram of the neck May 17, 2018 FINDINGS: Pharynx and larynx: Normal. No mass or swelling. Salivary glands: No inflammation, mass, or stone. Thyroid: Normal. Lymph nodes: None enlarged or abnormal density. Specifically, no lymphadenopathy identified in the right submandibular region. Vascular: Postsurgical changes from right carotid endarterectomy. Calcified plaques in the left carotid bifurcation. Calcified plaques are also seen in the aortic arch and at the origin of the major neck arteries without hemodynamically significant stenosis. Limited intracranial: Negative. Visualized orbits: Negative. Mastoids and visualized paranasal sinuses: Mucous retention cysts in the right maxillary sinus Skeleton: Impacted bilateral mandibular third molar teeth. No acute or aggressive process. Upper chest: Mild centrilobular emphysema. Other: None. IMPRESSION: 1. No mass or adenopathy identified. Specifically, no lymphadenopathy identified in the right submandibular region. 2. Postsurgical changes from right carotid endarterectomy. 3. Mild centrilobular emphysema. 4. Impacted bilateral mandibular third molar teeth. 5. Emphysema and aortic atherosclerosis. Aortic Atherosclerosis (ICD10-I70.0) and Emphysema (ICD10-J43.9). Electronically Signed   By: Pedro Earls M.D.   On: 10/28/2019 10:59   CT Angio Abd/Pel w/ and/or w/o  Result Date: 10/28/2019 CLINICAL DATA:  69 year old female with a history of abdominal aortic aneurysm, scheduled follow-up evaluation. EXAM: CTA ABDOMEN AND PELVIS WITHOUT AND WITH CONTRAST TECHNIQUE: Multidetector CT imaging of the abdomen and pelvis was performed using the standard protocol during bolus administration of intravenous contrast. Multiplanar reconstructed images and MIPs were obtained and reviewed to evaluate the vascular  anatomy. CONTRAST:  128mL OMNIPAQUE IOHEXOL 350 MG/ML SOLN COMPARISON:  Prior CT scan of the abdomen and pelvis 12/30/2014 FINDINGS: VASCULAR Aorta: Mild fusiform aneurysmal dilation of the infrarenal abdominal aorta with a maximal transverse diameter of 3.2 cm. Extensive mixed fibrofatty and calcified atherosclerotic plaque is present throughout the abdominal aorta. Celiac: Patent without evidence of aneurysm, dissection, vasculitis or significant stenosis. SMA: Patent without evidence of aneurysm, dissection, vasculitis or significant stenosis. Renals: Solitary renal arteries bilaterally. Heavily calcified atherosclerotic plaque results in significant stenosis of the proximal left renal artery. IMA: Patent without evidence of aneurysm,  dissection, vasculitis or significant stenosis. Inflow: Heavily calcified plaque throughout the common iliac arteries bilaterally. Plaque extends into the origins of the internal iliac arteries likely resulting in at least moderate stenosis on the left. The internal iliac arteries remain patent. Plaque continues through the external iliac arteries and into the common femoral arteries. Proximal Outflow: Bilateral common femoral and visualized portions of the superficial and profunda femoral arteries are patent without evidence of aneurysm, dissection, vasculitis or significant stenosis. Veins: No focal venous abnormality Review of the MIP images confirms the above findings. NON-VASCULAR Lower chest: No acute abnormality. Hepatobiliary: Small focus of arterial hyperenhancement in hepatic segment 5 corresponds with a region of focal enhancement on the prior CT scan from 2016. Similar small focus of hyperenhancement in subcapsular aspect of segment 6, unchanged. These almost certainly represent benign hemangiomas. The hepatic contour appears mildly nodular. Cholelithiasis. No intra or extrahepatic biliary ductal dilatation. Pancreas: Unremarkable. No pancreatic ductal dilatation or  surrounding inflammatory changes. Spleen: Normal in size without focal abnormality. Adrenals/Urinary Tract: Unremarkable adrenal glands. No evidence of hydronephrosis, nephrolithiasis or enhancing renal mass. Small circumscribed water attenuation lesion arising from the posterior lower pole of the left kidney likely represents a benign cyst. Unremarkable ureters. The bladder is decompressed. Stomach/Bowel: No focal bowel wall thickening or evidence of obstruction. Lymphatic: No suspicious lymphadenopathy. Reproductive: Surgical changes of prior hysterectomy. Other: No abdominal wall hernia or abnormality. No abdominopelvic ascites. Musculoskeletal: No acute fracture or aggressive appearing lytic or blastic osseous lesion. IMPRESSION: VASCULAR 1. Mild fusiform aneurysmal dilation of the infrarenal abdominal aorta with a maximal diameter of 3.2 cm. Recommend followup by ultrasound in 3 years. This recommendation follows ACR consensus guidelines: White Paper of the ACR Incidental Findings Committee II on Vascular Findings. J Am Coll Radiol 2013; 10:789-794. Aortic aneurysm NOS (ICD10-I71.9); Aortic Atherosclerosis (ICD10-I70.0). 2. Heavily calcified atherosclerotic plaque in the proximal left renal artery results in at least moderate focal stenosis. 3. Heavily calcified atherosclerotic plaque throughout the iliac arteries bilaterally with multifocal regions of mild to moderate stenosis. NON-VASCULAR 1. No acute abnormality within the abdomen or pelvis. 2. Cholelithiasis. 3. Additional ancillary findings as above. Signed, Criselda Peaches, MD, [facility] Electronically Signed   By: Jacqulynn Cadet M.D.   On: 10/28/2019 15:02    Assessment/Plan  Carotid stenosis, bilateral No current symptoms.  Status post carotid endarterectomy a little over a year ago.  We will plan a carotid duplex at her next follow-up visit.  Continue current medical regimen  including aspirin and statin agent.  Essential hypertension blood pressure control important in reducing the progression of atherosclerotic disease. On appropriate oral medications.   Tobacco use disorder Represents an atherosclerotic risk factor for progression of her carotid disease as well as aneurysmal growth.  Abdominal aneurysm (HCC) Her thoracic aorta measures 3.9 cm which is the upper limits of normal.  This can be checked every few years with a CT scan.  Her abdominal aortic aneurysm measures approximately 3.2 cm in maximal diameter and should be checked in 1 year with a duplex.  Blood pressure control and tobacco cessation would be of benefit to avoid aneurysm growth.    Leotis Pain, MD  10/29/2019 5:44 PM    This note was created with Dragon medical transcription system.  Any errors from dictation are purely unintentional

## 2019-10-29 NOTE — Assessment & Plan Note (Signed)
No current symptoms.  Status post carotid endarterectomy a little over a year ago.  We will plan a carotid duplex at her next follow-up visit.  Continue current medical regimen including aspirin and statin agent.

## 2019-11-08 ENCOUNTER — Telehealth: Payer: Self-pay | Admitting: Family

## 2019-11-08 NOTE — Telephone Encounter (Signed)
Rejection Reason - Patient Declined" [facility] said 21 minutes ago

## 2019-11-11 NOTE — Telephone Encounter (Signed)
noted 

## 2019-11-19 ENCOUNTER — Ambulatory Visit (INDEPENDENT_AMBULATORY_CARE_PROVIDER_SITE_OTHER): Payer: PRIVATE HEALTH INSURANCE | Admitting: Vascular Surgery

## 2019-12-02 ENCOUNTER — Other Ambulatory Visit: Payer: Self-pay

## 2019-12-02 ENCOUNTER — Ambulatory Visit (INDEPENDENT_AMBULATORY_CARE_PROVIDER_SITE_OTHER): Payer: Medicare Other | Admitting: Family

## 2019-12-02 ENCOUNTER — Encounter: Payer: Self-pay | Admitting: Family

## 2019-12-02 VITALS — BP 147/93 | HR 65 | Temp 98.4°F | Ht 64.02 in | Wt 158.8 lb

## 2019-12-02 DIAGNOSIS — I10 Essential (primary) hypertension: Secondary | ICD-10-CM

## 2019-12-02 DIAGNOSIS — Z7189 Other specified counseling: Secondary | ICD-10-CM

## 2019-12-02 DIAGNOSIS — R32 Unspecified urinary incontinence: Secondary | ICD-10-CM | POA: Diagnosis not present

## 2019-12-02 DIAGNOSIS — R1319 Other dysphagia: Secondary | ICD-10-CM | POA: Diagnosis not present

## 2019-12-02 MED ORDER — LISINOPRIL 10 MG PO TABS: 10.0000 mg | ORAL_TABLET | Freq: Every day | ORAL | 1 refills | Status: DC

## 2019-12-02 NOTE — Progress Notes (Signed)
Subjective:    Patient ID: Tracy Bell, female    DOB: [birth year], 69 y.o.   MRN: [id number]  CC: Tracy Bell is a 69 y.o. female who presents today for follow up.   HPI: Feels well today No complaints  Like to discuss living will and HCPOA. She has completed . She would like to be Do not resuscitate.   Would like a natural death and does not want CPR in a medical emergency. She is at peace with if she was to have sudden life altering event such as heart attack, she does not want to be resuscitated. She doesn't want to be a burden on her only son.  She would like comfort measures taken and has explained those in her Living will.    Continues to have dysphagia. Couple of instances of choking with saliva, not from liquids or foods.    Urinary incontinence- Declines urology consult  HTN- compliant with medications. No CP, sob, palpitations, dizziness.  At home blood pressure 130s. Not sure of bottom number. Ate fried chicken yesterday  Smoker  Dr Lucky Cowboy [phone] for thoracic aortic aneurysm plan to repeat CT scan in 1 year with duplex. CT soft tissue neck 10/20/2019.  No mass or adenopathy identified.  Surgical changes of right carotid enterectomy.  Emphysema, aortic atherosclerosis Follow up with Mickle Plumb , PA 12/23/19   HISTORY:  Past Medical History:  Diagnosis Date  . Anxiety   . COPD (chronic obstructive pulmonary disease) ([place])   . Cough   . Depression   . Dizziness   . Dyspnea   . Heart murmur   . Hyperlipidemia   . Hypertension   . Lightheadedness   . Personal history of tobacco use, presenting hazards to health 08/28/2015   Past Surgical History:  Procedure Laterality Date  . ABDOMINAL HYSTERECTOMY  1980   complete  . BREAST BIOPSY  1970   normal  . DILATION AND CURETTAGE OF UTERUS    . ENDARTERECTOMY Right 07/12/2018   Procedure: ENDARTERECTOMY CAROTID;  Surgeon: Algernon Huxley, MD;  Location: [facility] ORS;  Service: Vascular;  Laterality: Right;    . TUBAL LIGATION     Family History  Problem Relation Age of Onset  . Cancer Mother        Pancreatic Cancer   . Cancer Father        Stomach Cancer   . Lung cancer Sister   . Cancer Sister        ovarian  . Lung cancer Sister     Allergies: Patient has no known allergies. Current Outpatient Medications on File Prior to Visit  Medication Sig Dispense Refill  . aspirin EC 81 MG tablet Take 1 tablet (81 mg total) by mouth daily. 30 tablet 6  . atorvastatin (LIPITOR) 40 MG tablet Take 1 tablet (40 mg total) by mouth 2 (two) times daily. 90 tablet 3  . cholecalciferol (VITAMIN D3) 25 MCG (1000 UT) tablet Take 1,000 Units by mouth daily.    Marland Kitchen diltiazem (CARDIZEM CD) 120 MG 24 hr capsule Take 1 capsule (120 mg total) by mouth daily. 90 capsule 0  . Multiple Vitamins-Minerals (CENTRUM ADULTS) TABS Take 1 tablet by mouth daily. 30 tablet 6  . Tiotropium Bromide-Olodaterol (STIOLTO RESPIMAT) 2.5-2.5 MCG/ACT AERS Inhale 2 puffs into the lungs daily. 4 g 2   No current facility-administered medications on file prior to visit.    Social History   Tobacco Use  . Smoking status: Current Every Day Smoker  Packs/day: 2.00    Years: 60.00    Pack years: 120.00    Start date: 05/03/1963  . Smokeless tobacco: Never Used  Vaping Use  . Vaping Use: Never used  Substance Use Topics  . Alcohol use: Yes    Alcohol/week: 0.0 standard drinks    Comment: occasionally  . Drug use: No    Review of Systems  Constitutional: Negative for chills, fever and unexpected weight change.  HENT: Positive for trouble swallowing.   Respiratory: Negative for cough.   Cardiovascular: Negative for chest pain and palpitations.  Gastrointestinal: Negative for nausea and vomiting.      Objective:    BP (!) 147/93 (BP Location: Left Arm, Patient Position: Sitting)   Pulse 65   Temp 98.4 F (36.9 C)   Ht 5' 4.02" (1.626 m)   Wt 158 lb 12.8 oz (72 kg)   SpO2 99%   BMI 27.24 kg/m  BP Readings from Last  3 Encounters:  12/02/19 (!) 147/93  10/29/19 134/79  10/23/19 140/80   Wt Readings from Last 3 Encounters:  12/02/19 158 lb 12.8 oz (72 kg)  10/29/19 158 lb (71.7 kg)  10/23/19 158 lb (71.7 kg)    Physical Exam Vitals reviewed.  Constitutional:      Appearance: She is well-developed.  Eyes:     Conjunctiva/sclera: Conjunctivae normal.  Cardiovascular:     Rate and Rhythm: Normal rate and regular rhythm.     Pulses: Normal pulses.     Heart sounds: Normal heart sounds.  Pulmonary:     Effort: Pulmonary effort is normal.     Breath sounds: Normal breath sounds. No wheezing, rhonchi or rales.  Skin:    General: Skin is warm and dry.  Neurological:     Mental Status: She is alert.  Psychiatric:        Speech: Speech normal.        Behavior: Behavior normal.        Thought Content: Thought content normal.        Assessment & Plan:   Problem List Items Addressed This Visit      Cardiovascular and Mediastinum   Essential hypertension    Elevated.  Increase lisinopril to 10mg .  Repeat BMP in 1 week      Relevant Medications   lisinopril (ZESTRIL) 10 MG tablet   Other Relevant Orders   Basic metabolic panel     Digestive   Dysphagia - Primary    Chronic.  Patient and I jointly agreed to a consult ENT for  further evaluation.      Relevant Orders   Ambulatory referral to ENT     Other   Do not resuscitate discussion    We had a long discussion in regards to what DO NOT RESUSCITATE order would mean.  Patient would like to further read order and MOST form which was provided today and will make a follow up after she has discussed her wishes with her only son.         Urinary incontinence    Declines urology consult or further work-up at this time          I have changed Avonlea M. Schow's lisinopril. I am also having her maintain her aspirin EC, Centrum Adults, cholecalciferol, diltiazem, Stiolto Respimat, and atorvastatin.   Meds ordered this  encounter  Medications  . lisinopril (ZESTRIL) 10 MG tablet    Sig: Take 1 tablet (10 mg total) by mouth daily.  Dispense:  90 tablet    Refill:  1    DX Code Needed  NEEDS REFILL.    Order Specific Question:   Supervising Provider    Answer:   Crecencio Mc [[id number]]    Return precautions given.   Risks, benefits, and alternatives of the medications and treatment plan prescribed today were discussed, and patient expressed understanding.   Education regarding symptom management and diagnosis given to patient on AVS.  Continue to follow with Burnard Hawthorne, FNP for routine health maintenance.   Diara M Sullivant and I agreed with plan.   [name], FNP

## 2019-12-02 NOTE — Patient Instructions (Addendum)
Since your blood pressure was elevated today, let us go ahead and increase lisinopril to 10 mg taken once per day.  We will need to repeat your labs in 1 week.  It is imperative that you are seen AT least twice per year for labs and monitoring. Monitor blood pressure at home and me 5-6 reading on separate days. Goal is less than 120/80, based on newest guidelines, however we certainly want to be less than 130/80;  if persistently higher, please make sooner follow up appointment so we can recheck you blood pressure and manage/ adjust medications.  Please review them over DNR, MOST form.  Please let me know in regards to questions and once they are completed we can scan to your chart  Referral to ent  Let us know if you dont hear back within a week in regards to an appointment being scheduled.

## 2019-12-03 DIAGNOSIS — Z7189 Other specified counseling: Secondary | ICD-10-CM | POA: Insufficient documentation

## 2019-12-03 NOTE — Assessment & Plan Note (Signed)
Declines urology consult or further work-up at this time

## 2019-12-03 NOTE — Assessment & Plan Note (Signed)
Chronic.  Patient and I jointly agreed to a consult ENT for  further evaluation.

## 2019-12-03 NOTE — Assessment & Plan Note (Signed)
Elevated.  Increase lisinopril to 10mg .  Repeat BMP in 1 week

## 2019-12-03 NOTE — Assessment & Plan Note (Signed)
We had a long discussion in regards to what DO NOT RESUSCITATE order would mean.  Patient would like to further read order and MOST form which was provided today and will make a follow up after she has discussed her wishes with her only son.

## 2019-12-09 ENCOUNTER — Encounter: Payer: Self-pay | Admitting: Family

## 2019-12-10 ENCOUNTER — Other Ambulatory Visit (INDEPENDENT_AMBULATORY_CARE_PROVIDER_SITE_OTHER): Payer: Medicare Other

## 2019-12-10 ENCOUNTER — Other Ambulatory Visit: Payer: Self-pay

## 2019-12-10 DIAGNOSIS — I10 Essential (primary) hypertension: Secondary | ICD-10-CM | POA: Diagnosis not present

## 2019-12-10 LAB — BASIC METABOLIC PANEL
BUN: 14 mg/dL (ref 6–23)
CO2: 31 mEq/L (ref 19–32)
Calcium: 9.1 mg/dL (ref 8.4–10.5)
Chloride: 102 mEq/L (ref 96–112)
Creatinine, Ser: 0.87 mg/dL (ref 0.40–1.20)
GFR: 64.09 mL/min (ref >60.00)
Glucose, Bld: 95 mg/dL (ref 70–99)
Potassium: 4.7 mEq/L (ref 3.5–5.1)
Sodium: 140 mEq/L (ref 135–145)

## 2019-12-11 ENCOUNTER — Telehealth: Payer: Self-pay | Admitting: Pulmonary Disease

## 2019-12-11 NOTE — Telephone Encounter (Signed)
Called Christy and scheduled a follow up appointment on 8.17.21 at 11:30 am.

## 2019-12-11 NOTE — Telephone Encounter (Signed)
Patient has been scheduled for 55mo rov with Tyler Aas on 01/13/2020 at 10:00a. Patient is aware and voiced her understanding.  Nothing further is needed.

## 2019-12-17 ENCOUNTER — Telehealth (INDEPENDENT_AMBULATORY_CARE_PROVIDER_SITE_OTHER): Payer: Medicare Other | Admitting: Family

## 2019-12-17 ENCOUNTER — Encounter: Payer: Self-pay | Admitting: Family

## 2019-12-17 DIAGNOSIS — I471 Supraventricular tachycardia: Secondary | ICD-10-CM

## 2019-12-17 DIAGNOSIS — I10 Essential (primary) hypertension: Secondary | ICD-10-CM

## 2019-12-17 NOTE — Patient Instructions (Addendum)
We discussed electrophysiology and ablation. Please discuss this with Mickle Plumb, PA next week. You may also discuss a stress test with her.   Continue monitoring blood pressure and I will get back to you in regards to changes to your existing regimen.  If ever one of your epiosodes was to feel different, more frequent, last longer than usual, please call 911.

## 2019-12-17 NOTE — Assessment & Plan Note (Addendum)
4 episodes of blood pressure > 140/90 however she also had reading 88/72. We agreed to wait on to see cardiologist next week for further changes to regimen in setting of SVT. Will follow.

## 2019-12-17 NOTE — Progress Notes (Signed)
Verbal consent for services obtained from patient prior to services given to TELEPHONE visit:   Location of call:  provider at work patient at home  Names of all persons present for services: Tracy Paris, NP and patient  Follow up [place] well today.  Would like to discuss recent blood pressure and heart rate.   HTN- taking lisinopril 10mg  midday.   Notes when taking her blood pressure, sometimes will be 'having a spell', which may last one minute up to an hour. She intentionally takes her blood pressure during the time as she wants to know her blood pressure.  No CP, sob, dizziness, HA, vision changes.   SVT- unchanged. Not increased. on diltiazem, takes midday as well. 'no rhyme or reason' when I have  Spell. May go several days and not have a spell and then some days will have a spell 2-3 times in a day.   Holter monitor 05/2019 showed frequent SVT. Declined EP referral.   appt with Mickle Plumb 12/23/19   90/74, 127/76, 143/82, 101/82, 88/72 HR 57, 64, 128, 142, 140  History, background, results pertinent:    A/P/next steps:  Problem List Items Addressed This Visit      Cardiovascular and Mediastinum   Essential hypertension    4 episodes of blood pressure > 140/90 however she also had reading 88/72. We agreed to wait on to see cardiologist next week for further changes to regimen in setting of SVT. Will follow.       SVT (supraventricular tachycardia) (HCC)    Chronic, unchanged. Experiencing breakthrough runs. Recent holter showed frequent SVT. Advised re consideration for EP as recommended by cardiology. She has upcoming cardiology appointment which she will discuss with [facility]. Will follow         I spent 21 min  discussing plan of care over the phone, discussing SVT, reviewing cardiology notes.

## 2019-12-23 ENCOUNTER — Other Ambulatory Visit: Payer: Self-pay

## 2019-12-23 ENCOUNTER — Encounter: Payer: Self-pay | Admitting: Physician Assistant

## 2019-12-23 ENCOUNTER — Ambulatory Visit: Payer: Medicare Other | Admitting: Physician Assistant

## 2019-12-23 VITALS — BP 100/60 | HR 120 | Ht 65.5 in | Wt 159.1 lb

## 2019-12-23 DIAGNOSIS — I2510 Atherosclerotic heart disease of native coronary artery without angina pectoris: Secondary | ICD-10-CM

## 2019-12-23 DIAGNOSIS — I350 Nonrheumatic aortic (valve) stenosis: Secondary | ICD-10-CM

## 2019-12-23 DIAGNOSIS — I471 Supraventricular tachycardia, unspecified: Secondary | ICD-10-CM

## 2019-12-23 DIAGNOSIS — E785 Hyperlipidemia, unspecified: Secondary | ICD-10-CM

## 2019-12-23 DIAGNOSIS — R002 Palpitations: Secondary | ICD-10-CM

## 2019-12-23 DIAGNOSIS — Z79899 Other long term (current) drug therapy: Secondary | ICD-10-CM

## 2019-12-23 DIAGNOSIS — I4892 Unspecified atrial flutter: Secondary | ICD-10-CM

## 2019-12-23 DIAGNOSIS — I7121 Aneurysm of the ascending aorta, without rupture: Secondary | ICD-10-CM

## 2019-12-23 DIAGNOSIS — I712 Thoracic aortic aneurysm, without rupture: Secondary | ICD-10-CM

## 2019-12-23 DIAGNOSIS — I10 Essential (primary) hypertension: Secondary | ICD-10-CM

## 2019-12-23 DIAGNOSIS — Z720 Tobacco use: Secondary | ICD-10-CM

## 2019-12-23 MED ORDER — DILTIAZEM HCL ER COATED BEADS 240 MG PO CP24
240.0000 mg | ORAL_CAPSULE | Freq: Every day | ORAL | 6 refills | Status: DC
Start: 2019-12-23 — End: 2020-07-15

## 2019-12-23 MED ORDER — APIXABAN 5 MG PO TABS: 5.0000 mg | ORAL_TABLET | Freq: Two times a day (BID) | ORAL | 11 refills | Status: DC

## 2019-12-23 NOTE — Patient Instructions (Addendum)
Medication Instructions:  - Your physician has recommended you make the following change in your medication:   1) STOP aspirin  2) STOP lisinopril  3) INCREASE diltiazem to 240 mg- take 1 capsule by mouth once daily  4) START eliquis 5 mg- take 1 tablet by mouth twice daily  Samples Given: Eliquis 5 mg Lot: [id number] Exp: 06/2021 # 2 boxes given  *If you need a refill on your cardiac medications before your next appointment, please call your pharmacy*   Lab Work: - Your physician recommends that you have lab work today: CBC  - We will repeat lab work at your follow up appointment in 2 weeks- CBC  If you have labs (blood work) drawn today and your tests are completely normal, you will receive your results only by: Marland Kitchen MyChart Message (if you have MyChart) OR . A paper copy in the mail If you have any lab test that is abnormal or we need to change your treatment, we will call you to review the results.   Testing/Procedures: - none ordered   Follow-Up: At [facility], you and your health needs are our priority.  As part of our continuing mission to provide you with exceptional heart care, we have created designated Provider Care Teams.  These Care Teams include your primary Cardiologist (physician) and Advanced Practice Providers (APPs -  Physician Assistants and Nurse Practitioners) who all work together to provide you with the care you need, when you need it.  We recommend signing up for the patient portal called "MyChart".  Sign up information is provided on this After Visit Summary.  MyChart is used to connect with patients for Virtual Visits (Telemedicine).  Patients are able to view lab/test results, encounter notes, upcoming appointments, etc.  Non-urgent messages can be sent to your provider as well.   To learn more about what you can do with MyChart, go to [email or web address].    Your next appointment:   2 week(s)  The format for your next appointment:   In  Person  Provider:   [name], PA-C   Other Instructions    Atrial Flutter  Atrial flutter is a type of abnormal heart rhythm (arrhythmia). The heart has an electrical system that tells it how to beat. In atrial flutter, the signals move rapidly in the top chambers of the heart (the atria). This makes your heart beat very fast. Atrial flutter can come and go, or it can be permanent. The goal of treatment is to prevent blood clots from forming, control your heart rate, or restore your heartbeat to a normal rhythm. If this condition is not treated, it can cause serious problems, such as a weakened heart muscle (cardiomyopathy) or a stroke. What are the causes? This condition is often caused by conditions that damage the heart's electrical system. These include:  Heart conditions and heart surgery. These include heart attacks and open-heart surgery.  Lung problems, such as COPD or a blood clot in the lung (pulmonary embolism, or PE).  Poorly controlled high blood pressure (hypertension).  Overactive thyroid (hyperthyroidism).  Diabetes. In some cases, the cause of this condition is not known. What increases the risk? You are more likely to develop this condition if:  You are an elderly adult.  You are a man.  You are overweight (obese).  You have obstructive sleep apnea.  You have a family history of atrial flutter.  You have diabetes.  You drink a lot of alcohol, especially binge drinking.  You use  drugs, including cannabis.  You smoke. What are the signs or symptoms? Symptoms of this condition include:  A feeling that your heart is pounding or racing (palpitations).  Shortness of breath.  Chest pain.  Feeling dizzy or light-headed.  Fainting.  Low blood pressure (hypotension).  Fatigue.  Tiring easily during exercise or activity. In some cases, there are no symptoms. How is this diagnosed? This condition may be diagnosed with:  An  electrocardiogram (ECG) to check electrical signals of the heart.  An ambulatory cardiac monitor to record your heart's activity for a few days.  An echocardiogram to create pictures of your heart.  A transesophageal echocardiogram (TEE) to create even better pictures of your heart.  A stress test to check your blood supply while you exercise.  Imaging tests, such as a CT scan or chest X-ray.  Blood tests. How is this treated? Treatment depends on underlying conditions and how you feel when you experience atrial flutter. This condition may be treated with:  Medicines to prevent blood clots or to treat heart rate or heart rhythm problems.  Electrical cardioversion to reset the heart's rhythm.  Ablation to remove the heart tissue that sends abnormal signals.  Left atrial appendage closure to seal the area where blood clots can form. In some cases, underlying conditions will be treated. Follow these instructions at home: Medicines  Take over-the-counter and prescription medicines only as told by your health care provider.  Do not take any new medicines without talking to your health care provider.  If you are taking blood thinners: ? Talk with your health care provider before you take any medicines that contain aspirin or NSAIDs, such as ibuprofen. These medicines increase your risk for dangerous bleeding. ? Take your medicine exactly as told, at the same time every day. ? Avoid activities that could cause injury or bruising, and follow instructions about how to prevent falls. ? Wear a medical alert bracelet or carry a card that lists what medicines you take. Lifestyle  Eat heart-healthy foods. Talk with a dietitian to make an eating plan that is right for you.  Do not use any products that contain nicotine or tobacco, such as cigarettes, e-cigarettes, and chewing tobacco. If you need help quitting, ask your health care provider.  Do not drink alcohol.  Do not use drugs,  including cannabis.  Lose weight if you are overweight or obese.  Exercise regularly as instructed by your health care provider. General instructions  Do not use diet pills unless your health care provider approves. Diet pills may make heart problems worse.  If you have obstructive sleep apnea, manage your condition as told by your health care provider.  Keep all follow-up visits as told by your health care provider. This is important. Contact a health care provider if you:  Notice a change in the rate, rhythm, or strength of your heartbeat.  Are taking a blood thinner and you notice more bruising.  Have a sudden change in weight.  Tire more easily when you exercise or do heavy work. Get help right away if you have:  Pain or pressure in your chest.  Shortness of breath.  Fainting.  Increasing sweating with no known cause.  Side effects of blood thinners, such as blood in your vomit, stool, or urine, or bleeding that cannot stop.  Any symptoms of a stroke. "BE FAST" is an easy way to remember the main warning signs of a stroke: ? B - Balance. Signs are dizziness, sudden trouble  walking, or loss of balance. ? E - Eyes. Signs are trouble seeing or a sudden change in vision. ? F - Face. Signs are sudden weakness or numbness of the face, or the face or eyelid drooping on one side. ? A - Arms. Signs are weakness or numbness in an arm. This happens suddenly and usually on one side of the body. ? S - Speech. Signs are sudden trouble speaking, slurred speech, or trouble understanding what people say. ? T - Time. Time to call emergency services. Write down what time symptoms started.  Other signs of a stroke, such as: ? A sudden, severe headache with no known cause. ? Nausea or vomiting. ? Seizure.  These symptoms may represent a serious problem that is an emergency. Do not wait to see if the symptoms will go away. Get medical help right away. Call your local emergency services  (911 in the [place] Do not drive yourself to the hospital. Summary  Atrial flutter is an abnormal heart rhythm that can give you symptoms of palpitations, shortness of breath, or fatigue.  Atrial flutter is often treated with medicines to keep your heart in a normal rhythm and to prevent a stroke.  Get help right away if you cannot catch your breath, or have chest pain or pressure.  Get help right away if you have signs or symptoms of a stroke. This information is not intended to replace advice given to you by your health care provider. Make sure you discuss any questions you have with your health care provider. Document Revised: 10/10/2018 Document Reviewed: 10/10/2018 Elsevier Patient Education  [place] (Apixaban) oral tablets What is this medicine? APIXABAN (a PIX a ban) is an anticoagulant (blood thinner). It is used to lower the chance of stroke in people with a medical condition called atrial fibrillation. It is also used to treat or prevent blood clots in the lungs or in the veins. This medicine may be used for other purposes; ask your health care provider or pharmacist if you have questions. COMMON BRAND NAME(S): Eliquis What should I tell my health care provider before I take this medicine? They need to know if you have any of these conditions:  antiphospholipid antibody syndrome  bleeding disorders  bleeding in the brain  blood in your stools (black or tarry stools) or if you have blood in your vomit  history of blood clots  history of stomach bleeding  kidney disease  liver disease  mechanical heart valve  an unusual or allergic reaction to apixaban, other medicines, foods, dyes, or preservatives  pregnant or trying to get pregnant  breast-feeding How should I use this medicine? Take this medicine by mouth with a glass of water. Follow the directions on the prescription label. You can take it with or without food. If it upsets your  stomach, take it with food. Take your medicine at regular intervals. Do not take it more often than directed. Do not stop taking except on your doctor's advice. Stopping this medicine may increase your risk of a blood clot. Be sure to refill your prescription before you run out of medicine. Talk to your pediatrician regarding the use of this medicine in children. Special care may be needed. Overdosage: If you think you have taken too much of this medicine contact a poison control center or emergency room at once. NOTE: This medicine is only for you. Do not share this medicine with others. What if I miss a dose? If  you miss a dose, take it as soon as you can. If it is almost time for your next dose, take only that dose. Do not take double or extra doses. What may interact with this medicine? This medicine may interact with the following:  aspirin and aspirin-like medicines  certain medicines for fungal infections like ketoconazole and itraconazole  certain medicines for seizures like carbamazepine and phenytoin  certain medicines that treat or prevent blood clots like warfarin, enoxaparin, and dalteparin  clarithromycin  NSAIDs, medicines for pain and inflammation, like ibuprofen or naproxen  rifampin  ritonavir  St. John's wort This list may not describe all possible interactions. Give your health care provider a list of all the medicines, herbs, non-prescription drugs, or dietary supplements you use. Also tell them if you smoke, drink alcohol, or use illegal drugs. Some items may interact with your medicine. What should I watch for while using this medicine? Visit your healthcare professional for regular checks on your progress. You may need blood work done while you are taking this medicine. Your condition will be monitored carefully while you are receiving this medicine. It is important not to miss any appointments. Avoid sports and activities that might cause injury while you are  using this medicine. Severe falls or injuries can cause unseen bleeding. Be careful when using sharp tools or knives. Consider using an Copy. Take special care brushing or flossing your teeth. Report any injuries, bruising, or red spots on the skin to your healthcare professional. If you are going to need surgery or other procedure, tell your healthcare professional that you are taking this medicine. Wear a medical ID bracelet or chain. Carry a card that describes your disease and details of your medicine and dosage times. What side effects may I notice from receiving this medicine? Side effects that you should report to your doctor or health care professional as soon as possible:  allergic reactions like skin rash, itching or hives, swelling of the face, lips, or tongue  signs and symptoms of bleeding such as bloody or black, tarry stools; red or dark-brown urine; spitting up blood or brown material that looks like coffee grounds; red spots on the skin; unusual bruising or bleeding from the eye, gums, or nose  signs and symptoms of a blood clot such as chest pain; shortness of breath; pain, swelling, or warmth in the leg  signs and symptoms of a stroke such as changes in vision; confusion; trouble speaking or understanding; severe headaches; sudden numbness or weakness of the face, arm or leg; trouble walking; dizziness; loss of coordination This list may not describe all possible side effects. Call your doctor for medical advice about side effects. You may report side effects to FDA at 1-800-FDA-1088. Where should I keep my medicine? Keep out of the reach of children. Store at room temperature between 20 and 25 degrees C (68 and 77 degrees F). Throw away any unused medicine after the expiration date. NOTE: This sheet is a summary. It may not cover all possible information. If you have questions about this medicine, talk to your doctor, pharmacist, or health care provider.  2020  Elsevier/Gold Standard (2017-12-27 17:39:34)

## 2019-12-23 NOTE — Progress Notes (Signed)
Office Visit    Patient Name: Tracy Bell Date of Encounter: 12/23/2019  Primary Care Provider:  Burnard Hawthorne, FNP Primary Cardiologist:  Dr. Fletcher Anon  Chief Complaint    Chief Complaint  Patient presents with  . other    2 month follow up. Meds reviewed by the pt. verbally. Pt. c/o shortness of breath and fatigue.    69 year old female with history of SVT, new atrial flutter (8/23) with variable block, Mobitz I (Wenckebach), COPD, hyperlipidemia, aortic stenosis, AAA (3.2cm, upper limits of normal with recommendation for recheck 09/2020), hypertension, HLD, carotid disease s/p right carotid endarterectomy (07/2018), tobacco use (1-2 packs daily), and seen today for 2 month follow-up.  Past Medical History    Past Medical History:  Diagnosis Date  . Anxiety   . COPD (chronic obstructive pulmonary disease) ([place])   . Cough   . Depression   . Dizziness   . Dyspnea   . Heart murmur   . Hyperlipidemia   . Hypertension   . Lightheadedness   . Personal history of tobacco use, presenting hazards to health 08/28/2015   Past Surgical History:  Procedure Laterality Date  . ABDOMINAL HYSTERECTOMY  1980   complete  . BREAST BIOPSY  1970   normal  . DILATION AND CURETTAGE OF UTERUS    . ENDARTERECTOMY Right 07/12/2018   Procedure: ENDARTERECTOMY CAROTID;  Surgeon: Algernon Huxley, MD;  Location: [facility] ORS;  Service: Vascular;  Laterality: Right;  . TUBAL LIGATION      Allergies  No Known Allergies  History of Present Illness    Tracy Bell is a 69 y.o. female with PMH as above.  She was seen in the past by Dr. Saralyn Pilar. Previous CT scan showed evidence of CAC.   She underwent a stress echo in 12/2015, during which she exercised for 4 minutes and 36 seconds without CP or EKG changes. Echo showed nl LVSF with mild AS and no evidence of ischemia.   She reported palpitations in 2019 with 72 hour monitor performed and showing NSR with frequent PACs with occasional brief  atrial runs, the longest of which lasted 7 beats.   04/2018 echo showed nl LVSF, mild to moderate AS with mean gradient 90mHg and valve area 1.16.   At her outpatient visit 05/17/19, she reported intermittent palpitations and tachycardia, as well as pulsation on the right side of her neck. Associated sx included mild dizziness, diaphoresis, and mild SOB.  Zio was performed and showed 100 SVT runs, with the fastest lasting 1 min 26 seconds with max rate of 197bpm and the longest 50 min and 54 seconds with average rate of 144 bm. The MD notification criteria for SVT was met. Also noted was second degree AVB - Mobitz I (Wenckebach) with sleep study recommended at RTC but politely declined per patient.   Updated echo as below showed EF 60-65%, G2DD, mild TR, mild to moderate AS with mean gradient 169mg and valve area 1.4cm2. Mildly elevated PASP was also noted with RVSP 32.44m16m. Given her normal EF and SVT, she was transitioned from amlodipine to diltiazem for better control of her sx.   When seen at subsequent visits, she noted ongoing racing HR, palpitations, dizziness, and R sided neck pulsation. She felt occasional nausea or as if something was pressing on the front of her throat. She was still smoking. She occasionally added salt to her food and avoiding caffeine past the AM and all alcohol.  She was very good  with monitoring her BP closely at home. Lipitor was increased to 41m. Smoking cessation was still encouraged.  Since her last office visit, lisinopril escalated to 134m  Since her last visit, she has also seen vascular surgery with recommendation to continue to monitor her AAA dilation, which is at the upper limits of normal, and with recommendation for duplex recheck in 1 year or ~ 09/2020. They will repeat a carotid duplex at her next RTC.   Today, she returns to clinic for follow-up and is not feeling well. Specifically, she feels fatigued and also has noted lower BP. She reports that  "Friday was a bad day," in that she did not feel well and is dealing with a lot of family stressors. She is concerned as [place] going through a divorce and she wants to be there and be healthy for them; however, she continues to feel poorly and lately feels worse than previous visits. She reports ongoing symptoms as outlined above with spells of SVT.  Her BP log is reviewed today with significantly lower pressures since increase of lisinopril. She reports that she feels faint / dizzy with her SVT episodes with new atrial flutter discussed toay in clinic.  She reports significant concern regarding her tight R neck, along her sternocleidomastoid with recommendation to discuss with PCP as may need to discuss with Dr, KrMack Guiseseen in past). She reports her main issues are now DOE and fatigue with lower BP. She will still get racing HR and palpitations, as well as dysphagia, sometimes choking on her spit. She continues to defer GI referral. She does not drink alcohol and is still working on quitting smoking though still smoking. EKG significant for atrial flutter with recommendations as below for [facility] [place] for ablation versus DCCV again discussed with pt understanding and preference to continue to defer. She does note frustration that she continues to feel poorly.   Home Medications    Prior to Admission medications   Medication Sig Start Date End Date Taking? Authorizing Provider  aspirin EC 81 MG tablet Take 1 tablet (81 mg total) by mouth daily. 03/14/14   WaJackolyn ConferMD  atorvastatin (LIPITOR) 10 MG tablet TAKE 1 TABLET BY MOUTH EVERY DAY 04/23/19   ArBurnard HawthorneFNP  cholecalciferol (VITAMIN D3) 25 MCG (1000 UT) tablet Take 1,000 Units by mouth daily.    [provider]  diltiazem (CARDIZEM CD) 120 MG 24 hr capsule Take 1 capsule (120 mg total) by mouth daily. 06/19/19 09/17/19  ViMarrianne Mood, PA-C  lisinopril (ZESTRIL) 5 MG tablet TAKE 1  TABLET BY MOUTH EVERY DAY 05/28/19   ArBurnard HawthorneFNP  Multiple Vitamins-Minerals (CENTRUM ADULTS) TABS Take 1 tablet by mouth daily. 03/14/14   WaJackolyn ConferMD  Correction: taking atorvastatin 8013maily  Review of Systems    She denies pnd, orthopnea, n, v, syncope, edema, or early satiety.She reports continued episodes of right neck pulsation (with new R sided neck tightness), pressure on the front of her throat/high neck, shortness of breath at rest during these episodes, DOE, and fatigue.  She also occasionally notices dizziness.    All other systems reviewed and are otherwise negative except as noted above.  Physical Exam    VS:  BP 100/60 (BP Location: Left Arm, Patient Position: Sitting, Cuff Size: Normal)   Pulse (!) 120   Ht 5' 5.5" (1.664 m)   Wt 159 lb 2 oz (72.2 kg)   SpO2  97%   BMI 26.08 kg/m  , BMI Body mass index is 26.08 kg/m. GEN: Well nourished, well developed, in no acute distress. Noticeably fatigued HEENT: normal. Neck: Supple, no JVD, carotid bruits, or masses. R sided neck tightness noted.  Cardiac:regular and tachycardic, 3/6 systolic murmur. No rubs or gallops. No clubbing, cyanosis, edema.  Radials 2+ /DP/PT 1+ bilaterally.  Respiratory:  Coarse breath sounds bilaterally. GI: Soft, nontender, nondistended, BS + x 4. MS: no deformity or atrophy. Skin: warm and dry, no rash. Neuro:  Strength and sensation are intact. Psych: Normal affect.  Accessory Clinical Findings    ECG personally reviewed by me today - new atrial flutter with variable block, ventricular rate 120bpm  VITALS Reviewed today   Temp Readings from Last 3 Encounters:  12/02/19 98.4 F (36.9 C)  10/15/19 97.8 F (36.6 C) (Temporal)  10/14/19 (!) 97.2 F (36.2 C)   BP Readings from Last 3 Encounters:  12/23/19 100/60  12/02/19 (!) 147/93  10/29/19 134/79   Pulse Readings from Last 3 Encounters:  12/23/19 (!) 120  12/02/19 65  10/29/19 71    Wt Readings from Last 3  Encounters:  12/23/19 159 lb 2 oz (72.2 kg)  12/17/19 158 lb (71.7 kg)  12/02/19 158 lb 12.8 oz (72 kg)     LABS  reviewed today    Lab Results  Component Value Date   WBC 11.3 (H) 07/27/2018   HGB 15.2 (H) 07/27/2018   HCT 44.6 07/27/2018   MCV 92.1 07/27/2018   PLT 279.0 07/27/2018   Lab Results  Component Value Date   CREATININE 0.87 12/10/2019   BUN 14 12/10/2019   NA 140 12/10/2019   K 4.7 12/10/2019   CL 102 12/10/2019   CO2 31 12/10/2019   Lab Results  Component Value Date   ALT 19 05/15/2019   AST 21 05/15/2019   ALKPHOS 74 05/15/2019   BILITOT 0.8 05/15/2019   Lab Results  Component Value Date   CHOL 167 05/15/2019   HDL 58.10 05/15/2019   LDLCALC 90 05/15/2019   TRIG 93.0 05/15/2019   CHOLHDL 3 05/15/2019    Lab Results  Component Value Date   HGBA1C 5.8 03/23/2018   Lab Results  Component Value Date   TSH 3.05 03/23/2018     STUDIES/PROCEDURES reviewed today   Zio Predominantly NSR, HR 25-197 with average HR 65bpm. 100 SVT runs occurred. The run with the fastest interval lasted 1 minute and 26 seconds with maximum rate of 197 bpm, the longest lasting 50 min 54 seconds with an average rate of 144bpm. Second degree AVB Mobitz (Wenckebach) was present. SVT was detected within +- 45 seconds of symptomatic patient events. Isolated SVE were rare (<1.0%), SVE Couplets were rare (<1.0%), and SVE Triplets were rare (<1.0%). Isolated VE were rare (<1.0%), VE Couplets were rare (<1.0%), and no VE Triplets were present. MD notification for SVT met.   Echo 06/05/19 1. Left ventricular ejection fraction, by visual estimation, is 60 to  65%. The left ventricle has normal function. There is borderline left  ventricular hypertrophy.  2. Left ventricular diastolic parameters are consistent with Grade II  diastolic dysfunction (pseudonormalization).  3. The left ventricle has no regional wall motion abnormalities.  4. Global right ventricle has normal  systolic function.The right  ventricular size is normal. Right vetricular wall thickness was not  assessed.  5. Left atrial size was normal.  6. Right atrial size was normal.  7. The mitral valve is normal  in structure. No evidence of mitral valve  regurgitation.  8. The tricuspid valve is normal in structure.  9. The tricuspid valve is normal in structure. Tricuspid valve  regurgitation is mild.  10. The aortic valve is abnormal. Aortic valve regurgitation is not  visualized. Mild to moderate aortic valve stenosis.  11. Peak AV gradient 27mHg, MG 114mg, DVI 0.45, with LVOT diameter of  2cm, AVA 1.4cm2.  12. The pulmonic valve was not well visualized. Pulmonic valve  regurgitation is not visualized.  13. Mildly elevated pulmonary artery systolic pressure.  14. The inferior vena cava is normal in size with greater than 50%  respiratory variability, suggesting right atrial pressure of 3 mmHg.    Assessment & Plan    New Atrial Flutter with variable block and RVR History of SVT --History of SVT with atrial flutter with variable block by EKG today. Continues to experience sx on diltiazem 12041mescalated to diltiazem 240m63may with lisinopril discontinued given need for room in BP today as soft pressures (and personal log with SBP into the 90s at times). Given CHA2DS2VASc score of at least 5 (CHF, HTN, agex1, vascular, female), will start Eliquis 5mg 58m with plan for repeat CBC in 2 weeks. Discussed her aortic stenosis today, given this could also be contributing to her current presentation and as below. Also discussed EP referral. She continues to prefer to defer referral to EP for ablation with preference to first try medication.Continue to recommend sleep study, given early AM Mobitz as below, and with referral provided at previous visits. She plans to continue to monitor her sx and BP/HR.   Mobitz I (Wenckebach) Possible sleep apnea --Continue to recommend a sleep study,  given her  Mobitz I (Wenckebach) in the early hours of the morning and while asleep.Pulmonology referral provided last visit with notes as per EMR regarding COPD.  Aortic stenosis, mild to moderate --Mild to moderate by most recent echo; however, given her sx, low threshold for TEE to better assess this valve.   Coronary artery disease involving the native coronary arteries on CT Aortic atherosclerosis on CT Bilateral lower extremity leg weakness, coldness, calf pain, reduced pulses --Previous CT scan of the lungs showedaortic atherosclerosis and coronary atherosclerosis.She does have significant risk factors for coronary artery disease, including current smoker.At last visit, increased to atorvastatin 80mg 28mnd will recheck LDL/LFTs at RTC.Continue Eliquis in lieu of ASA.No s/sx of bleeding.Risk factor modification discussed / lifestyle changes.  Smoking cessation encouraged with patient working towards this goal.  HLD --As above. Continue Lipitor 80mg d10m.  LDL goal <70. Recheck LDL/LFTs at RTC. Continue Eliquis in lieu of ASA. Lifestyle changes discussed, including smoking cessation.    Essential hypertension --Blood pressuresoft, and given escalation of diltiazem, will discontinue Lisinopril.Low-salt diet discussed, as well as ongoing caffeine and attempts at reduced nicotine.Referral provided at previous visit for sleep study at previous visits. Increase activity as tolerated.   Ascending aortic aneurysm, previously noted on CT 05/17/18 --Previous 1/16 CT scan noted 4.3 cm ascending aortic aneurysm, not noted on most recent echo or 04/2019 CT. Heart rate and blood pressure control recommended.Continue Lipitor 80mg da66mwith Eliquisin place of ASA.  Smoking cessation advised. Avoid heavy lifting and FQ.  Carotid artery diseases/p Rcarotid endarterectomy --Followed by vascular surgery.No bruit heard on exam.No reported amaurosis fugax. Smokingcessation, as well as  BP/heart rate control.Continue Lipitor 80mg. Co43mue ASA. Risk factor modification recommended.  Current smoker(1/2-1 packs / day) Nodule of the left lung --L lung nodule  as directly below. Discussed pulmonology recommendations or A/P after referral at previous visit. Per 04/02/2019 CT, lung RADS 2 with benign appearance of behavior.Recommendation on CT scan report to continue annual screening with low-dose chest CT without contrast every 12 months.  Medication changes: Increase to diltiazem 225m, start Eliquis 528mBID (given atrial flutter), stop ASA (given Eliquis), stop lisinopril (to allow room in pressure). Will need to update medication list, given she is taking atorvastatin 8094maily. Labs ordered: CBC (given starting Eliquis) and lipids (to recheck after increased to atorvastatin 36m60mt RTC. Studies / Imaging ordered: None. TEE of valve if ongoing sx. Future considerations: [facility] ?Smoking cessation.  Disposition: RTC 2 weeks or sooner if needed.     JacqArvil Chaco-C 12/23/2019

## 2019-12-24 DIAGNOSIS — K219 Gastro-esophageal reflux disease without esophagitis: Secondary | ICD-10-CM | POA: Diagnosis not present

## 2019-12-24 DIAGNOSIS — D3709 Neoplasm of uncertain behavior of other specified sites of the oral cavity: Secondary | ICD-10-CM | POA: Diagnosis not present

## 2019-12-24 DIAGNOSIS — R1314 Dysphagia, pharyngoesophageal phase: Secondary | ICD-10-CM | POA: Diagnosis not present

## 2019-12-24 DIAGNOSIS — R05 Cough: Secondary | ICD-10-CM | POA: Diagnosis not present

## 2019-12-24 LAB — CBC WITH DIFFERENTIAL/PLATELET
Basophils Absolute: 0.1 10*3/uL (ref 0.0–0.2)
Basos: 1 %
EOS (ABSOLUTE): 0.2 10*3/uL (ref 0.0–0.4)
Eos: 2 %
Hematocrit: 47.1 % — ABNORMAL HIGH (ref 34.0–46.6)
Hemoglobin: 16.2 g/dL — ABNORMAL HIGH (ref 11.1–15.9)
Immature Grans (Abs): 0.1 10*3/uL (ref 0.0–0.1)
Immature Granulocytes: 1 %
Lymphocytes Absolute: 2.6 10*3/uL (ref 0.7–3.1)
Lymphs: 29 %
MCH: 30.5 pg (ref 26.6–33.0)
MCHC: 34.4 g/dL (ref 31.5–35.7)
MCV: 89 fL (ref 79–97)
Monocytes Absolute: 0.9 10*3/uL (ref 0.1–0.9)
Monocytes: 10 %
Neutrophils Absolute: 5.2 10*3/uL (ref 1.4–7.0)
Neutrophils: 57 %
Platelets: 219 10*3/uL (ref 150–450)
RBC: 5.31 x10E6/uL — ABNORMAL HIGH (ref 3.77–5.28)
RDW: 12.4 % (ref 11.7–15.4)
WBC: 9.1 10*3/uL (ref 3.4–10.8)

## 2019-12-25 ENCOUNTER — Telehealth: Payer: Self-pay | Admitting: Cardiovascular Disease

## 2019-12-25 NOTE — Telephone Encounter (Signed)
Patient also sent MyChart message RE: atorvastatin dosage which is currently being addressed by Verlon Au, RN and Lorenso Quarry, PA-C.

## 2019-12-25 NOTE — Assessment & Plan Note (Signed)
Chronic, unchanged. Experiencing breakthrough runs. Recent holter showed frequent SVT. Advised re consideration for EP as recommended by cardiology. She has upcoming cardiology appointment which she will discuss with [facility]. Will follow

## 2019-12-25 NOTE — Telephone Encounter (Signed)
*  STAT* If patient is at the pharmacy, call can be transferred to refill team.   1. Which medications need to be refilled? (please list name of each medication and dose if known)   Atorvastatin 40 mg po q d vs BID  Patient currently taking once daily   2. Which pharmacy/location (including street and city if local pharmacy) is medication to be sent to?  [facility] [address] [place]   3. Do they need a 30 day or 90 day supply? [place]

## 2019-12-27 MED ORDER — ATORVASTATIN CALCIUM 80 MG PO TABS
80.0000 mg | ORAL_TABLET | Freq: Every day | ORAL | 3 refills | Status: DC
Start: 2019-12-27 — End: 2020-12-30

## 2019-12-27 NOTE — Telephone Encounter (Signed)
Call to patient to make her aware that atorvastatin should be taken once daily, 80 mg total.   Made call to pharmacy to make them aware and updated order placed.   Advised pt to call for any further questions or concerns.

## 2020-01-13 ENCOUNTER — Encounter: Payer: Self-pay | Admitting: Pulmonary Disease

## 2020-01-13 ENCOUNTER — Other Ambulatory Visit: Payer: Self-pay

## 2020-01-13 ENCOUNTER — Ambulatory Visit: Payer: Medicare Other | Admitting: Pulmonary Disease

## 2020-01-13 VITALS — BP 110/86 | HR 71 | Temp 97.7°F | Ht 65.0 in | Wt 157.4 lb

## 2020-01-13 DIAGNOSIS — J449 Chronic obstructive pulmonary disease, unspecified: Secondary | ICD-10-CM

## 2020-01-13 DIAGNOSIS — F17200 Nicotine dependence, unspecified, uncomplicated: Secondary | ICD-10-CM

## 2020-01-13 DIAGNOSIS — F17210 Nicotine dependence, cigarettes, uncomplicated: Secondary | ICD-10-CM

## 2020-01-13 DIAGNOSIS — Z0000 Encounter for general adult medical examination without abnormal findings: Secondary | ICD-10-CM | POA: Insufficient documentation

## 2020-01-13 MED ORDER — VARENICLINE TARTRATE 0.5 MG X 11 & 1 MG X 42 PO MISC: ORAL | 0 refills | Status: DC

## 2020-01-13 NOTE — Assessment & Plan Note (Signed)
Plan: Emphasized need to stop smoking Emphasized to the patient she needs to obtain the seasonal flu vaccine Remain in lung cancer screening program for repeat CT in December/2021

## 2020-01-13 NOTE — Assessment & Plan Note (Signed)
Discussion: Reviewed with patient pulmonary function testing as well as current staging by COPD guidelines Emphasized the patient that she needs to work aggressively on stopping smoking  Plan: Continue Stiolto Respimat Encourage patient to follow instructions of how to prime Stiolto Respimat inhaler Emphasized need to stop smoking Chantix starter pack prescribed today Encourage patient to reconsider stance on flu vaccine as she is high risk for complications from the flu given the fact that her age is over 2, she is a current smoker and she has COPD 16-month follow-up with our office

## 2020-01-13 NOTE — Progress Notes (Signed)
@Patient  ID: Tracy Bell, female    DOB: [birth year], 69 y.o.   MRN: [id number]  Chief Complaint  Patient presents with  . Follow-up    inhaler question regarding stiolto and priming.    Referring provider: Burnard Hawthorne, FNP  HPI:  69 year old female current everyday smoker followed in our office for COPD  PMH: Dysphagia, depression, hyperlipidemia, carotid stenosis, SVT, palpitations Smoker/ Smoking History: Current everyday smoker.  Smoking 2 packs/day.  120-pack-year smoking history. Maintenance: Stiolto Respimat Pt of: Dr. Patsey Berthold  01/13/2020  - Visit   69 year old female current everyday smoker followed in our office for COPD.  She is followed by Dr. Patsey Berthold.  Last seen in June/2021.  She is completing a 7-month follow-up with our office today.  She was given a trial of Stiolto Respimat at that time.    Patient is up-to-date on her COVID-19 vaccines  Patient is working on trying to stop smoking.  She has tried to stop smoking the past with Wellbutrin.  She does not feel that this was helpful.  She still smoking around 30 cigarettes a day.  She feels that her Stiolto Respimat does help but still is having worsening shortness of breath.  Patient is enrolled in the lung cancer screening program is due for repeat lung cancer screening CT in December/2021.   Questionaires / Pulmonary Flowsheets:   ACT:  No flowsheet data found.  MMRC: No flowsheet data found.  Epworth:  No flowsheet data found.  Tests:   10/28/2019-CT neck-no mass or adenopathy identified, specifically no lymphadenopathy identified in the right submandibular region, mild centrilobular emphysema, impacted bilateral mandibular third molar teeth  04/02/2019-CT chest lung cancer screening-lung RADS 2, aortic arthrosclerosis, moderate centrilobular emphysema  09/06/2019-pulmonary function test-FVC 1.85 (57% predicted), postbronchodilator ratio 62, postbronchodilator FEV1 1.27 (52% predicted), no  bronchodilator response, TLC 4.92 (91% predicted) DLCO 12.69 (61% predicted)  FENO:  No results found for: NITRICOXIDE  PFT: No flowsheet data found.  WALK:  No flowsheet data found.  Imaging: No results found.  Lab Results:  CBC    Component Value Date/Time   WBC 9.1 12/23/2019 1038   WBC 11.3 (H) 07/27/2018 1018   RBC 5.31 (H) 12/23/2019 1038   RBC 4.84 07/27/2018 1018   HGB 16.2 (H) 12/23/2019 1038   HCT 47.1 (H) 12/23/2019 1038   PLT 219 12/23/2019 1038   MCV 89 12/23/2019 1038   MCH 30.5 12/23/2019 1038   MCH 30.7 07/13/2018 0425   MCHC 34.4 12/23/2019 1038   MCHC 34.1 07/27/2018 1018   RDW 12.4 12/23/2019 1038   LYMPHSABS 2.6 12/23/2019 1038   MONOABS 0.9 07/27/2018 1018   EOSABS 0.2 12/23/2019 1038   BASOSABS 0.1 12/23/2019 1038    BMET    Component Value Date/Time   NA 140 12/10/2019 1018   K 4.7 12/10/2019 1018   CL 102 12/10/2019 1018   CO2 31 12/10/2019 1018   GLUCOSE 95 12/10/2019 1018   BUN 14 12/10/2019 1018   CREATININE 0.87 12/10/2019 1018   CALCIUM 9.1 12/10/2019 1018   GFRNONAA >60 07/13/2018 0425   GFRAA >60 07/13/2018 0425    BNP No results found for: BNP  ProBNP No results found for: PROBNP  Specialty Problems      Pulmonary Problems   Nodule of left lung    Seen CT Chest 5/12. Recommended following in 6 months.       Stage 2 moderate COPD by GOLD classification (HCC)   SOB (shortness of breath)  on exertion      No Known Allergies  Immunization History  Administered Date(s) Administered  . PFIZER SARS-COV-2 Vaccination 08/01/2019, 08/28/2019  . Pneumococcal Polysaccharide-23 07/13/2018  . Tdap 12/02/2014   Emphasized the patient should obtain the seasonal flu vaccine, she reports that she never gets the flu vaccine   Past Medical History:  Diagnosis Date  . Anxiety   . COPD (chronic obstructive pulmonary disease) ([place])   . Cough   . Depression   . Dizziness   . Dyspnea   . Heart murmur   . Hyperlipidemia     . Hypertension   . Lightheadedness   . Personal history of tobacco use, presenting hazards to health 08/28/2015    Tobacco History: Social History   Tobacco Use  Smoking Status Current Every Day Smoker  . Packs/day: 2.00  . Years: 60.00  . Pack years: 120.00  . Start date: 05/03/1963  Smokeless Tobacco Never Used  Tobacco Comment   1-2 pks per day.  not smoking in the house any longer.   Ready to quit: Yes Counseling given: Yes Comment: 1-2 pks per day.  not smoking in the house any longer.  Smoking assessment and cessation counseling  Patient currently smoking: 30 cigarettes I have advised the patient to quit/stop smoking as soon as possible due to high risk for multiple medical problems.  It will also be very difficult for [place] to manage patient's  respiratory symptoms and status if we continue to expose her lungs to a known irritant.  We do not advise e-cigarettes as a form of stopping smoking.  Patient is  willing to quit smoking.  Has not set a quit date.  Is willing to try Chantix.  Did not have success with Wellbutrin.  I have advised the patient that we can assist and have options of nicotine replacement therapy, provided smoking cessation education today, provided smoking cessation counseling, and provided cessation resources.  Follow-up next office visit office visit for assessment of smoking cessation.    Smoking cessation counseling advised for: 12 min     Outpatient Encounter Medications as of 01/13/2020  Medication Sig  . apixaban (ELIQUIS) 5 MG TABS tablet Take 1 tablet (5 mg total) by mouth 2 (two) times daily.  Marland Kitchen atorvastatin (LIPITOR) 80 MG tablet Take 1 tablet (80 mg total) by mouth daily.  . cholecalciferol (VITAMIN D3) 25 MCG (1000 UT) tablet Take 1,000 Units by mouth daily.  Marland Kitchen diltiazem (CARDIZEM CD) 240 MG 24 hr capsule Take 1 capsule (240 mg total) by mouth daily.  . Multiple Vitamins-Minerals (CENTRUM ADULTS) TABS Take 1 tablet by mouth daily.  .  Tiotropium Bromide-Olodaterol (STIOLTO RESPIMAT) 2.5-2.5 MCG/ACT AERS Inhale 2 puffs into the lungs daily.  . varenicline (CHANTIX PAK) 0.5 MG X 11 & 1 MG X 42 tablet Take one 0.5 mg tablet by mouth once daily for 3 days, then increase to one 0.5 mg tablet twice daily for 4 days, then increase to one 1 mg tablet twice daily.   No facility-administered encounter medications on file as of 01/13/2020.     Review of Systems  Review of Systems  Constitutional: Positive for fatigue. Negative for activity change and fever.  HENT: Negative for sinus pressure, sinus pain and sore throat.   Respiratory: Positive for shortness of breath. Negative for cough and wheezing.   Cardiovascular: Negative for chest pain and palpitations.  Gastrointestinal: Negative for diarrhea, nausea and vomiting.  Musculoskeletal: Negative for arthralgias.  Neurological: Negative for dizziness.  Psychiatric/Behavioral: Negative for sleep disturbance. The patient is not nervous/anxious.      Physical Exam  BP 110/86 (BP Location: Left Arm, Cuff Size: Normal)   Pulse 71   Temp 97.7 F (36.5 C) (Temporal)   Ht 5\' 5"  (1.651 m)   Wt 157 lb 6.4 oz (71.4 kg)   SpO2 98%   BMI 26.19 kg/m   Wt Readings from Last 5 Encounters:  01/13/20 157 lb 6.4 oz (71.4 kg)  12/23/19 159 lb 2 oz (72.2 kg)  12/17/19 158 lb (71.7 kg)  12/02/19 158 lb 12.8 oz (72 kg)  10/29/19 158 lb (71.7 kg)    BMI Readings from Last 5 Encounters:  01/13/20 26.19 kg/m  12/23/19 26.08 kg/m  12/17/19 27.12 kg/m  12/02/19 27.24 kg/m  10/29/19 27.12 kg/m     Physical Exam Vitals and nursing note reviewed.  Constitutional:      General: She is not in acute distress.    Appearance: Normal appearance.  HENT:     Head: Normocephalic and atraumatic.     Right Ear: External ear normal.     Left Ear: External ear normal.  Eyes:     Pupils: Pupils are equal, round, and reactive to light.  Cardiovascular:     Rate and Rhythm: Normal rate  and regular rhythm.     Pulses: Normal pulses.     Heart sounds: Normal heart sounds. No murmur heard.   Pulmonary:     Effort: Pulmonary effort is normal. No respiratory distress.     Breath sounds: Normal breath sounds. No decreased air movement. No decreased breath sounds, wheezing or rales.  Musculoskeletal:     Cervical back: Normal range of motion.  Skin:    General: Skin is warm and dry.     Capillary Refill: Capillary refill takes less than 2 seconds.  Neurological:     General: No focal deficit present.     Mental Status: She is alert and oriented to person, place, and time. Mental status is at baseline.     Gait: Gait normal.  Psychiatric:        Mood and Affect: Mood normal.        Behavior: Behavior normal.        Thought Content: Thought content normal.        Judgment: Judgment normal.       Assessment & Plan:   Stage 2 moderate COPD by GOLD classification ([place]) Discussion: Reviewed with patient pulmonary function testing as well as current staging by COPD guidelines Emphasized the patient that she needs to work aggressively on stopping smoking  Plan: Continue Stiolto Respimat Encourage patient to follow instructions of how to prime Stiolto Respimat inhaler Emphasized need to stop smoking Chantix starter pack prescribed today Encourage patient to reconsider stance on flu vaccine as she is high risk for complications from the flu given the fact that her age is over 10, she is a current smoker and she has COPD 44-month follow-up with our office  Tobacco use disorder Patient working on trying to stop smoking Previously used Wellbutrin, did not find much success Still currently smoking 30 cigarettes a day  Discussion: Reviewed with patient COPD staging as well as FEV1 Emphasized emphatically to the patient that she needs to stop smoking This is the most important thing for her to be doing right now for better management of her health and her  breathing  Plan: Patient to start Chantix Reviewed side effects of Chantix and signs to  monitor Provided additional information on AVS regarding Chantix Offered nicotine replacement therapies in addition to Chantix, she declined Encourage patient to obtain seasonal flu vaccine Encourage patient to follow reduce to quit method Her main established in lung cancer screening program with a repeat lung cancer screening CT in December/2021 110-month follow-up with our office  Healthcare maintenance Plan: Emphasized need to stop smoking Emphasized to the patient she needs to obtain the seasonal flu vaccine Remain in lung cancer screening program for repeat CT in December/2021    Return in about 3 months (around 04/13/2020), or if symptoms worsen or fail to improve, for Follow up with Wyn Quaker FNP-C, [facility] - Dr. Patsey Berthold.   Lauraine Rinne, NP 01/13/2020   This appointment required 42 minutes of patient care (this includes precharting, chart review, review of results, face-to-face care, etc.).

## 2020-01-13 NOTE — Assessment & Plan Note (Addendum)
Patient working on trying to stop smoking Previously used Wellbutrin, did not find much success Still currently smoking 30 cigarettes a day  Discussion: Reviewed with patient COPD staging as well as FEV1 Emphasized emphatically to the patient that she needs to stop smoking This is the most important thing for her to be doing right now for better management of her health and her breathing  Plan: Patient to start Chantix Reviewed side effects of Chantix and signs to monitor Provided additional information on AVS regarding Chantix Offered nicotine replacement therapies in addition to Chantix, she declined Encourage patient to obtain seasonal flu vaccine Encourage patient to follow reduce to quit method Her main established in lung cancer screening program with a repeat lung cancer screening CT in December/2021 40-month follow-up with our office

## 2020-01-13 NOTE — Patient Instructions (Addendum)
You were seen today by Lauraine Rinne, NP  for:   1. Stage 2 moderate COPD by GOLD classification (HCC)  Stiolto Respimat inhaler >>>2 puffs daily >>>Take this no matter what >>>This is not a rescue inhaler  Note your daily symptoms > remember "red flags" for COPD:   >>>Increase in cough >>>increase in sputum production >>>increase in shortness of breath or activity  intolerance.   If you notice these symptoms, please call the office to be seen.    2. Tobacco use disorder  We will start you on Chantix today, take this as prescribed  We recommend that you stop smoking.  >>>You need to set a quit date >>>If you have friends or family who smoke, let them know you are trying to quit and not to smoke around you or in your living environment  Smoking Cessation Resources:  1 800 QUIT NOW  >>> Patient to call this resource and utilize it to help support her quit smoking >>> Keep up your hard work with stopping smoking  You can also contact the [facility] >>>For smoking cessation classes call [phone]  We do not recommend using e-cigarettes as a form of stopping smoking  You can sign up for smoking cessation support texts and information:  >>>https://smokefree.gov/smokefreetxt   Obtain your lung cancer screening CT in December/2021 as planned   3. Healthcare maintenance  We strongly recommend that you obtain the high-dose flu vaccine in fall/2021  Please discuss this with primary care      Follow Up:    Return in about 3 months (around 04/13/2020), or if symptoms worsen or fail to improve, for Follow up with Wyn Quaker FNP-C, [facility] - Dr. Patsey Berthold.   Notification of test results are managed in the following manner: If there are  any recommendations or changes to the  plan of care discussed in office today,  we will contact you and let you know what they are. If you do not hear from [place], then your results are normal and you can view them  through your  MyChart account , or a letter will be sent to you. Thank you again for trusting [place] with your care  - Thank you, [facility]    It is flu season:   >>> Best ways to protect herself from the flu: Receive the yearly flu vaccine, practice good hand hygiene washing with soap and also using hand sanitizer when available, eat a nutritious meals, get adequate rest, hydrate appropriately       Please contact the office if your symptoms worsen or you have concerns that you are not improving.   Thank you for choosing [facility] for your healthcare, and for allowing [place] to partner with you on your healthcare journey. I am thankful to be able to provide care to you today.   Wyn Quaker FNP-C    Chronic Obstructive Pulmonary Disease Chronic obstructive pulmonary disease (COPD) is a long-term (chronic) lung problem. When you have COPD, it is hard for air to get in and out of your lungs. Usually the condition gets worse over time, and your lungs will never return to normal. There are things you can do to keep yourself as healthy as possible.  Your doctor may treat your condition with: ? Medicines. ? Oxygen. ? Lung surgery.  Your doctor may also recommend: ? Rehabilitation. This includes steps to make your body work better. It may involve a team of specialists. ? Quitting smoking, if  you smoke. ? Exercise and changes to your diet. ? Comfort measures (palliative care). Follow these instructions at home: Medicines  Take over-the-counter and prescription medicines only as told by your doctor.  Talk to your doctor before taking any cough or allergy medicines. You may need to avoid medicines that cause your lungs to be dry. Lifestyle  If you smoke, stop. Smoking makes the problem worse. If you need help quitting, ask your doctor.  Avoid being around things that make your breathing worse. This may include smoke, chemicals, and fumes.  Stay active, but remember to  rest as well.  Learn and use tips on how to relax.  Make sure you get enough sleep. Most adults need at least 7 hours of sleep every night.  Eat healthy foods. Eat smaller meals more often. Rest before meals. Controlled breathing Learn and use tips on how to control your breathing as told by your doctor. Try:  Breathing in (inhaling) through your nose for 1 second. Then, pucker your lips and breath out (exhale) through your lips for 2 seconds.  Putting one hand on your belly (abdomen). Breathe in slowly through your nose for 1 second. Your hand on your belly should move out. Pucker your lips and breathe out slowly through your lips. Your hand on your belly should move in as you breathe out.  Controlled coughing Learn and use controlled coughing to clear mucus from your lungs. Follow these steps: 1. Lean your head a little forward. 2. Breathe in deeply. 3. Try to hold your breath for 3 seconds. 4. Keep your mouth slightly open while coughing 2 times. 5. Spit any mucus out into a tissue. 6. Rest and do the steps again 1 or 2 times as needed. General instructions  Make sure you get all the shots (vaccines) that your doctor recommends. Ask your doctor about a flu shot and a pneumonia shot.  Use oxygen therapy and pulmonary rehabilitation if told by your doctor. If you need home oxygen therapy, ask your doctor if you should buy a tool to measure your oxygen level (oximeter).  Make a COPD action plan with your doctor. This helps you to know what to do if you feel worse than usual.  Manage any other conditions you have as told by your doctor.  Avoid going outside when it is very hot, cold, or humid.  Avoid people who have a sickness you can catch (contagious).  Keep all follow-up visits as told by your doctor. This is important. Contact a doctor if:  You cough up more mucus than usual.  There is a change in the color or thickness of the mucus.  It is harder to breathe than  usual.  Your breathing is faster than usual.  You have trouble sleeping.  You need to use your medicines more often than usual.  You have trouble doing your normal activities such as getting dressed or walking around the house. Get help right away if:  You have shortness of breath while resting.  You have shortness of breath that stops you from: ? Being able to talk. ? Doing normal activities.  Your chest hurts for longer than 5 minutes.  Your skin color is more blue than usual.  Your pulse oximeter shows that you have low oxygen for longer than 5 minutes.  You have a fever.  You feel too tired to breathe normally. Summary  Chronic obstructive pulmonary disease (COPD) is a long-term lung problem.  The way your lungs work will  never return to normal. Usually the condition gets worse over time. There are things you can do to keep yourself as healthy as possible.  Take over-the-counter and prescription medicines only as told by your doctor.  If you smoke, stop. Smoking makes the problem worse. This information is not intended to replace advice given to you by your health care provider. Make sure you discuss any questions you have with your health care provider. Document Revised: 03/31/2017 Document Reviewed: 05/23/2016 Elsevier Patient Education  [place].   Varenicline oral tablets What is this medicine? VARENICLINE (var e NI kleen) is used to help people quit smoking. It is used with a patient support program recommended by your physician. This medicine may be used for other purposes; ask your health care provider or pharmacist if you have questions. COMMON BRAND NAME(S): Chantix What should I tell my health care provider before I take this medicine? They need to know if you have any of these conditions:  heart disease  if you often drink alcohol  kidney disease  mental illness  on hemodialysis  seizures  history of stroke  suicidal thoughts,  plans, or attempt; a previous suicide attempt by you or a family member  an unusual or allergic reaction to varenicline, other medicines, foods, dyes, or preservatives  pregnant or trying to get pregnant  breast-feeding How should I use this medicine? Take this medicine by mouth after eating. Take with a full glass of water. Follow the directions on the prescription label. Take your doses at regular intervals. Do not take your medicine more often than directed. There are 3 ways you can use this medicine to help you quit smoking; talk to your health care professional to decide which plan is right for you: 1) you can choose a quit date and start this medicine 1 week before the quit date, or, 2) you can start taking this medicine before you choose a quit date, and then pick a quit date between day 8 and 35 days of treatment, or, 3) if you are not sure that you are able or willing to quit smoking right away, start taking this medicine and slowly decrease the amount you smoke as directed by your health care professional with the goal of being cigarette-free by week 12 of treatment. Stick to your plan; ask about support groups or other ways to help you remain cigarette-free. If you are motivated to quit smoking and did not succeed during a previous attempt with this medicine for reasons other than side effects, or if you returned to smoking after this treatment, speak with your health care professional about whether another course of this medicine may be right for you. A special MedGuide will be given to you by the pharmacist with each prescription and refill. Be sure to read this information carefully each time. Talk to your pediatrician regarding the use of this medicine in children. This medicine is not approved for use in children. Overdosage: If you think you have taken too much of this medicine contact a poison control center or emergency room at once. NOTE: This medicine is only for you. Do not  share this medicine with others. What if I miss a dose? If you miss a dose, take it as soon as you can. If it is almost time for your next dose, take only that dose. Do not take double or extra doses. What may interact with this medicine?  alcohol  insulin  other medicines used to help people quit  smoking  theophylline  warfarin This list may not describe all possible interactions. Give your health care provider a list of all the medicines, herbs, non-prescription drugs, or dietary supplements you use. Also tell them if you smoke, drink alcohol, or use illegal drugs. Some items may interact with your medicine. What should I watch for while using this medicine? It is okay if you do not succeed at your attempt to quit and have a cigarette. You can still continue your quit attempt and keep using this medicine as directed. Just throw away your cigarettes and get back to your quit plan. Talk to your health care provider before using other treatments to quit smoking. Using this medicine with other treatments to quit smoking may increase the risk for side effects compared to using a treatment alone. You may get drowsy or dizzy. Do not drive, use machinery, or do anything that needs mental alertness until you know how this medicine affects you. Do not stand or sit up quickly, especially if you are an older patient. This reduces the risk of dizzy or fainting spells. Decrease the number of alcoholic beverages that you drink during treatment with this medicine until you know if this medicine affects your ability to tolerate alcohol. Some people have experienced increased drunkenness (intoxication), unusual or sometimes aggressive behavior, or no memory of things that have happened (amnesia) during treatment with this medicine. Sleepwalking can happen during treatment with this medicine, and can sometimes lead to behavior that is harmful to you, other people, or property. Stop taking this medicine and tell  your doctor if you start sleepwalking or have other unusual sleep-related activity. After taking this medicine, you may get up out of bed and do an activity that you do not know you are doing. The next morning, you may have no memory of this. Activities include driving a car ("sleep-driving"), making and eating food, talking on the phone, sexual activity, and sleep-walking. Serious injuries have occurred. Stop the medicine and call your doctor right away if you find out you have done any of these activities. Do not take this medicine if you have used alcohol that evening. Do not take it if you have taken another medicine for sleep. The risk of doing these sleep-related activities is higher. Patients and their families should watch out for new or worsening depression or thoughts of suicide. Also watch out for sudden changes in feelings such as feeling anxious, agitated, panicky, irritable, hostile, aggressive, impulsive, severely restless, overly excited and hyperactive, or not being able to sleep. If this happens, call your health care professional. If you have diabetes and you quit smoking, the effects of insulin may be increased and you may need to reduce your insulin dose. Check with your doctor or health care professional about how you should adjust your insulin dose. What side effects may I notice from receiving this medicine? Side effects that you should report to your doctor or health care professional as soon as possible:  allergic reactions like skin rash, itching or hives, swelling of the face, lips, tongue, or throat  acting aggressive, being angry or violent, or acting on dangerous impulses  breathing problems  changes in emotions or moods  chest pain or chest tightness  feeling faint or lightheaded, falls  hallucination, loss of contact with reality  mouth sores  redness, blistering, peeling or loosening of the skin, including inside the mouth  signs and symptoms of a stroke  like changes in vision; confusion; trouble speaking or understanding;  severe headaches; sudden numbness or weakness of the face, arm or leg; trouble walking; dizziness; loss of balance or coordination  seizures  sleepwalking  suicidal thoughts or other mood changes Side effects that usually do not require medical attention (report to your doctor or health care professional if they continue or are bothersome):  constipation  gas  headache  nausea, vomiting  strange dreams  trouble sleeping This list may not describe all possible side effects. Call your doctor for medical advice about side effects. You may report side effects to FDA at 1-800-FDA-1088. Where should I keep my medicine? Keep out of the reach of children. Store at room temperature between 15 and 30 degrees C (59 and 86 degrees F). Throw away any unused medicine after the expiration date. NOTE: This sheet is a summary. It may not cover all possible information. If you have questions about this medicine, talk to your doctor, pharmacist, or health care provider.  2020 Elsevier/Gold Standard (2018-04-06 14:27:36)   Health Risks of Smoking Smoking cigarettes is very bad for your health. Tobacco smoke has over 200 known poisons in it. It contains the poisonous gases nitrogen oxide and carbon monoxide. There are over 60 chemicals in tobacco smoke that cause cancer. Smoking is difficult to quit because a chemical in tobacco, called nicotine, causes addiction or dependence. When you smoke and inhale, nicotine is absorbed rapidly into the bloodstream through your lungs. Both inhaled and non-inhaled nicotine may be addictive. What are the risks of cigarette smoke? Cigarette smokers have an increased risk of many serious medical problems, including:  Lung cancer.  Lung disease, such as pneumonia, bronchitis, and emphysema.  Chest pain (angina) and heart attack because the heart is not getting enough oxygen.  Heart disease and  peripheral blood vessel disease.  High blood pressure (hypertension).  Stroke.  Oral cancer, including cancer of the lip, mouth, or voice box.  Bladder cancer.  Pancreatic cancer.  Cervical cancer.  Pregnancy complications, including premature birth.  Stillbirths and smaller newborn babies, birth defects, and genetic damage to sperm.  Early menopause.  Lower estrogen level for women.  Infertility.  Facial wrinkles.  Blindness.  Increased risk of broken bones (fractures).  Senile dementia.  Stomach ulcers and internal bleeding.  Delayed wound healing and increased risk of complications during surgery.  Even smoking lightly shortens your life expectancy by several years. Because of secondhand smoke exposure, children of smokers have an increased risk of the following:  Sudden infant death syndrome (SIDS).  Respiratory infections.  Lung cancer.  Heart disease.  Ear infections. What are the benefits of quitting? There are many health benefits of quitting smoking. Here are some of them:  Within days of quitting smoking, your risk of having a heart attack decreases, your blood flow improves, and your lung capacity improves. Blood pressure, pulse rate, and breathing patterns start returning to normal soon after quitting.  Within months, your lungs may clear up completely.  Quitting for 10 years reduces your risk of developing lung cancer and heart disease to almost that of a nonsmoker.  People who quit may see an improvement in their overall quality of life. How do I quit smoking?     Smoking is an addiction with both physical and psychological effects, and longtime habits can be hard to change. Your health care provider can recommend:  Programs and community resources, which may include group support, education, or talk therapy.  Prescription medicines to help reduce cravings.  Nicotine replacement products, such as  patches, gum, and nasal sprays. Use  these products only as directed. Do not replace cigarette smoking with electronic cigarettes, which are commonly called e-cigarettes. The safety of e-cigarettes is not known, and some may contain harmful chemicals.  A combination of two or more of these methods. Where to find more information  American Lung Association: www.lung.org  American Cancer Society: www.cancer.org Summary  Smoking cigarettes is very bad for your health. Cigarette smokers have an increased risk of many serious medical problems, including several cancers, heart disease, and stroke.  Smoking is an addiction with both physical and psychological effects, and longtime habits can be hard to change.  By stopping right away, you can greatly reduce the risk of medical problems for you and your family.  To help you quit smoking, your health care provider can recommend programs, community resources, prescription medicines, and nicotine replacement products such as patches, gum, and nasal sprays. This information is not intended to replace advice given to you by your health care provider. Make sure you discuss any questions you have with your health care provider. Document Revised: 07/20/2017 Document Reviewed: 04/22/2016 Elsevier Patient Education  2020 [employer].

## 2020-01-14 ENCOUNTER — Ambulatory Visit: Payer: Medicare Other | Admitting: Physician Assistant

## 2020-01-14 ENCOUNTER — Encounter: Payer: Self-pay | Admitting: Physician Assistant

## 2020-01-14 VITALS — BP 120/80 | HR 67 | Ht 66.0 in | Wt 157.4 lb

## 2020-01-14 DIAGNOSIS — E785 Hyperlipidemia, unspecified: Secondary | ICD-10-CM

## 2020-01-14 DIAGNOSIS — Z9889 Other specified postprocedural states: Secondary | ICD-10-CM

## 2020-01-14 DIAGNOSIS — Z7901 Long term (current) use of anticoagulants: Secondary | ICD-10-CM

## 2020-01-14 DIAGNOSIS — I10 Essential (primary) hypertension: Secondary | ICD-10-CM | POA: Diagnosis not present

## 2020-01-14 DIAGNOSIS — J449 Chronic obstructive pulmonary disease, unspecified: Secondary | ICD-10-CM

## 2020-01-14 DIAGNOSIS — I471 Supraventricular tachycardia: Secondary | ICD-10-CM

## 2020-01-14 DIAGNOSIS — I2510 Atherosclerotic heart disease of native coronary artery without angina pectoris: Secondary | ICD-10-CM

## 2020-01-14 DIAGNOSIS — R911 Solitary pulmonary nodule: Secondary | ICD-10-CM

## 2020-01-14 DIAGNOSIS — I4892 Unspecified atrial flutter: Secondary | ICD-10-CM | POA: Diagnosis not present

## 2020-01-14 DIAGNOSIS — Z8679 Personal history of other diseases of the circulatory system: Secondary | ICD-10-CM

## 2020-01-14 DIAGNOSIS — R002 Palpitations: Secondary | ICD-10-CM

## 2020-01-14 DIAGNOSIS — I712 Thoracic aortic aneurysm, without rupture: Secondary | ICD-10-CM

## 2020-01-14 DIAGNOSIS — Z720 Tobacco use: Secondary | ICD-10-CM

## 2020-01-14 DIAGNOSIS — I7121 Aneurysm of the ascending aorta, without rupture: Secondary | ICD-10-CM

## 2020-01-14 DIAGNOSIS — I6523 Occlusion and stenosis of bilateral carotid arteries: Secondary | ICD-10-CM

## 2020-01-14 DIAGNOSIS — I441 Atrioventricular block, second degree: Secondary | ICD-10-CM

## 2020-01-14 NOTE — Progress Notes (Signed)
Week   Office Visit    Patient Name: Tracy Bell Date of Encounter: 01/14/2020  Primary Care Provider:  Burnard Hawthorne, FNP Primary Cardiologist:  Dr. Fletcher Anon  Chief Complaint    Chief Complaint  Patient presents with  . office visit    2 week F/U; Meds verbally reviewed with patient.   69 year old female with history of SVT, new atrial flutter (8/23) with variable block, Mobitz I (Wenckebach), COPD, hyperlipidemia, aortic stenosis, AAA (3.2cm, upper limits of normal with recommendation for recheck 09/2020), hypertension, HLD, carotid disease s/p right carotid endarterectomy (07/2018), tobacco use (1-2 packs daily), and seen today for 2 week follow-up.  Past Medical History    Past Medical History:  Diagnosis Date  . Anxiety   . COPD (chronic obstructive pulmonary disease) ([place])   . Cough   . Depression   . Dizziness   . Dyspnea   . Heart murmur   . Hyperlipidemia   . Hypertension   . Lightheadedness   . Personal history of tobacco use, presenting hazards to health 08/28/2015   Past Surgical History:  Procedure Laterality Date  . ABDOMINAL HYSTERECTOMY  1980   complete  . BREAST BIOPSY  1970   normal  . DILATION AND CURETTAGE OF UTERUS    . ENDARTERECTOMY Right 07/12/2018   Procedure: ENDARTERECTOMY CAROTID;  Surgeon: Algernon Huxley, MD;  Location: [facility] ORS;  Service: Vascular;  Laterality: Right;  . TUBAL LIGATION      Allergies  No Known Allergies  History of Present Illness    Tracy Bell is a 69 y.o. female with PMH as above.  She was seen in the past by Dr. Saralyn Pilar. Previous CT scan showed evidence of CAC.   She underwent a stress echo in 12/2015, during which she exercised for 4 minutes and 36 seconds without CP or EKG changes. Echo showed nl LVSF with mild AS and no evidence of ischemia.   She reported palpitations in 2019 with 72 hour monitor performed and showing NSR with frequent PACs with occasional brief atrial runs, the longest of which  lasted 7 beats.   04/2018 echo showed nl LVSF, mild to moderate AS with mean gradient 452mHg and valve area 1.16.   At her outpatient visit 05/17/19, she reported intermittent palpitations and tachycardia, as well as pulsation on the right side of her neck. Associated sx included mild dizziness, diaphoresis, and mild SOB.  Zio was performed and showed 100 SVT runs, with the fastest lasting 1 min 26 seconds with max rate of 197bpm and the longest 50 min and 54 seconds with average rate of 144 bm. The MD notification criteria for SVT was met. Also noted was second degree AVB - Mobitz I (Wenckebach) with sleep study recommended at RTC but politely declined per patient.   Updated echo as below showed EF 60-65%, G2DD, mild TR, mild to moderate AS with mean gradient 137mg and valve area 1.4cm2. Mildly elevated PASP was also noted with RVSP 32.52m46m. Given her normal EF and SVT, she was transitioned from amlodipine to diltiazem for better control of her sx.   When seen at subsequent visits, she noted ongoing racing HR, palpitations, dizziness, and R sided neck pulsation. She felt occasional nausea or as if something was pressing on the front of her throat. She was still smoking. She occasionally added salt to her food and avoiding caffeine past the AM and all alcohol.  She was very good with monitoring her BP closely at home. Lipitor  was increased to 76m. Smoking cessation was still encouraged.  Lisinopril dose and diltiazem doses also subsequently increased at subsequent clinic visits.  Of note, she has also seen vascular surgery with recommendation to continue to monitor her AAA dilation, which is at the upper limits of normal, and with recommendation for duplex recheck in 1 year or ~ 09/2020. They will repeat a carotid duplex at her next RTC.   When last seen in clinic, she was not feeling well and reported ongoing symptoms of palpitations.  Her BP log was reviewed today with significantly lower pressures  since increase of lisinopril. She reported feeling faint and dizzy with new atrial flutter discussed toay in clinic.  She was started on anticoagulation and diltiazem dose increased with lisinopril held.  She reported fatigue, DOE, palpitations, and dysphagia, sometimes choking on her spit. She continued to defer GI referral.  She was working on quitting smoking though still smoking.EP referral for ablation versus DCCV again discussed with pt understanding and preference to continue to defer.   Today, 01/14/2020, she returns to clinic and notes that she is feeling well.  She has felt better than usual over the past few days.  She has not had as many episodes of racing heart rate and palpitations and noticed increased energy.  In addition, she has noticed improved swallowing.  She reports recently cleaning a brush pile that she has been meaning to burn for some time now.  During this activity, she denies chest pain, racing heart rate, palpitations, or dyspnea.  She denies presyncope or syncope.  At rest, she continues to feel tachypalpitations at a decreased frequency.  She denies any presyncope or syncope.  No lower extremity edema, orthopnea, PND, or early satiety.  She denies any signs or symptoms of bleeding on new Eliquis 5 mg twice daily.  She reports improved home blood pressures on review of her log.  She has 1 outlier blood pressure and heart rate in the setting of stress, but otherwise has noted she feels much improved.  She was prescribed Chantix to quit smoking; however, on reading the side effects, has declined to start this medication.  She continues to politely decline sleep study and GI referral.  Home Medications    Prior to Admission medications   Medication Sig Start Date End Date Taking? Authorizing Provider  aspirin EC 81 MG tablet Take 1 tablet (81 mg total) by mouth daily. 03/14/14   WJackolyn Confer MD  atorvastatin (LIPITOR) 10 MG tablet TAKE 1 TABLET BY MOUTH EVERY DAY 04/23/19    ABurnard Hawthorne FNP  cholecalciferol (VITAMIN D3) 25 MCG (1000 UT) tablet Take 1,000 Units by mouth daily.    [provider]  diltiazem (CARDIZEM CD) 120 MG 24 hr capsule Take 1 capsule (120 mg total) by mouth daily. 06/19/19 09/17/19  VMarrianne MoodD, PA-C  lisinopril (ZESTRIL) 5 MG tablet TAKE 1 TABLET BY MOUTH EVERY DAY 05/28/19   ABurnard Hawthorne FNP  Multiple Vitamins-Minerals (CENTRUM ADULTS) TABS Take 1 tablet by mouth daily. 03/14/14   WJackolyn Confer MD  Correction: taking atorvastatin 81mdaily  Review of Systems    She denies pnd, orthopnea, n, v, syncope, shortness of breath at rest, edema, or early satiety. She reports improved tachypalpitations, DOE, swallowing, and fatigue.  All other systems reviewed and are otherwise negative except as noted above.  Physical Exam    VS:  BP 120/80 (BP Location: Left Arm, Patient Position: Sitting, Cuff Size: Normal)  Pulse 67   Ht '5\' 6"'  (1.676 m)   Wt 157 lb 6 oz (71.4 kg)   SpO2 95%   BMI 25.40 kg/m  , BMI Body mass index is 25.4 kg/m. GEN: Well nourished, well developed, in no acute distress. Noticeably fatigued HEENT: normal. Neck: Supple, no JVD, carotid bruits, or masses. Cardiac:regular and tachycardic, 3/6 systolic murmur best appreciated in RUSB and with radiation into the bilateral carotids. No rubs or gallops. No clubbing, cyanosis, edema.  Radials 2+ /DP/PT 1+ bilaterally.  Respiratory:  Coarse breath sounds bilaterally. GI: Soft, nontender, nondistended, BS + x 4. MS: no deformity or atrophy. Skin: warm and dry, no rash. Neuro:  Strength and sensation are intact. Psych: Normal affect.  Accessory Clinical Findings    ECG personally reviewed by me today -normal sinus rhythm with sinus arrhythmia, 67 bpm-no acute changes  VITALS Reviewed today   Temp Readings from Last 3 Encounters:  01/13/20 97.7 F (36.5 C) (Temporal)  12/02/19 98.4 F (36.9 C)  10/15/19 97.8 F (36.6 C) (Temporal)   BP  Readings from Last 3 Encounters:  01/14/20 120/80  01/13/20 110/86  12/23/19 100/60   Pulse Readings from Last 3 Encounters:  01/14/20 67  01/13/20 71  12/23/19 (!) 120    Wt Readings from Last 3 Encounters:  01/14/20 157 lb 6 oz (71.4 kg)  01/13/20 157 lb 6.4 oz (71.4 kg)  12/23/19 159 lb 2 oz (72.2 kg)     LABS  reviewed today    Lab Results  Component Value Date   WBC 9.1 12/23/2019   HGB 16.2 (H) 12/23/2019   HCT 47.1 (H) 12/23/2019   MCV 89 12/23/2019   PLT 219 12/23/2019   Lab Results  Component Value Date   CREATININE 0.87 12/10/2019   BUN 14 12/10/2019   NA 140 12/10/2019   K 4.7 12/10/2019   CL 102 12/10/2019   CO2 31 12/10/2019   Lab Results  Component Value Date   ALT 19 05/15/2019   AST 21 05/15/2019   ALKPHOS 74 05/15/2019   BILITOT 0.8 05/15/2019   Lab Results  Component Value Date   CHOL 167 05/15/2019   HDL 58.10 05/15/2019   LDLCALC 90 05/15/2019   TRIG 93.0 05/15/2019   CHOLHDL 3 05/15/2019    Lab Results  Component Value Date   HGBA1C 5.8 03/23/2018   Lab Results  Component Value Date   TSH 3.05 03/23/2018     STUDIES/PROCEDURES reviewed today   Zio Predominantly NSR, HR 25-197 with average HR 65bpm. 100 SVT runs occurred. The run with the fastest interval lasted 1 minute and 26 seconds with maximum rate of 197 bpm, the longest lasting 50 min 54 seconds with an average rate of 144bpm. Second degree AVB Mobitz (Wenckebach) was present. SVT was detected within +- 45 seconds of symptomatic patient events. Isolated SVE were rare (<1.0%), SVE Couplets were rare (<1.0%), and SVE Triplets were rare (<1.0%). Isolated VE were rare (<1.0%), VE Couplets were rare (<1.0%), and no VE Triplets were present. MD notification for SVT met.   Echo 06/05/19 1. Left ventricular ejection fraction, by visual estimation, is 60 to  65%. The left ventricle has normal function. There is borderline left  ventricular hypertrophy.  2. Left ventricular  diastolic parameters are consistent with Grade II  diastolic dysfunction (pseudonormalization).  3. The left ventricle has no regional wall motion abnormalities.  4. Global right ventricle has normal systolic function.The right  ventricular size is normal. Right vetricular  wall thickness was not  assessed.  5. Left atrial size was normal.  6. Right atrial size was normal.  7. The mitral valve is normal in structure. No evidence of mitral valve  regurgitation.  8. The tricuspid valve is normal in structure.  9. The tricuspid valve is normal in structure. Tricuspid valve  regurgitation is mild.  10. The aortic valve is abnormal. Aortic valve regurgitation is not  visualized. Mild to moderate aortic valve stenosis.  11. Peak AV gradient 60mHg, MG 119mg, DVI 0.45, with LVOT diameter of  2cm, AVA 1.4cm2.  12. The pulmonic valve was not well visualized. Pulmonic valve  regurgitation is not visualized.  13. Mildly elevated pulmonary artery systolic pressure.  14. The inferior vena cava is normal in size with greater than 50%  respiratory variability, suggesting right atrial pressure of 3 mmHg.    Assessment & Plan    New Atrial Flutter with variable block and RVR History of SVT --EKG shows NSR with sinus arrhythmia today.  She reports improvement in tachypalpitations since increase in her diltiazem to 240 mg as outlined above.  She has a history of SVT (see monitor above) and newly diagnosed atrial flutter with variable block.   --Reports improvement in symptoms with diltiazem 24024m Continue.   --Continue on Eliquis 5 mg twice daily given CHA2DS2VASc score of at least 5 (CHF, HTN, agex1, vascular, female).  She denies any signs or symptoms of bleeding.   --Repeat CBC to be obtained by her PCP with staff message sent today to PCP for confirmation.  She requests PCP labs given her insurance coverage.   --She continues to politely decline sleep study, given early AM Mobitz as below,  and given its association with atrial tachycardia.  Reassess at RTC.   --No medication changes or further work-up at this time.  Mobitz I (Wenckebach) Possible sleep apnea --Continue to recommend a sleep study, given her  Mobitz I (Wenckebach) in the early hours of the morning and while asleep.Pulmonology referral provided l at previous visits; however, today she indicates that she wishes to continue to defer a sleep study.  Aortic stenosis, mild to moderate --Continue to monitor.   Coronary artery disease involving the native coronary arteries on CT Aortic atherosclerosis on CT Bilateral lower extremity leg weakness, coldness, calf pain, reduced pulses --Previous CT scan of the lungs showedaortic atherosclerosis and coronary atherosclerosis.She does have significant risk factors for coronary artery disease, including current smoker. Continue atorvastatin 51m39m and Eliquis in lieu of ASA.Risk factor modification discussed / lifestyle changes.  Smoking cessation encouraged with patient working towards this goal.  HLD --As above. Continue high intensity statin with Lipitor 51mg24mly.  LDL goal <70. Continue Eliquis in lieu of ASA. Lifestyle changes discussed, including smoking cessation.    Defer to PCP regarding annual monitoring of lipids.  Essential hypertension --BP well controlled with DBP 80. Low-salt diet discussed, as well as caffeine use.  She continues to work  on quitting smoking. She politely declined sleep study. Increase activity as tolerated.  Continue current diltiazem to 40 mg daily.  Ascending aortic aneurysm, previously noted on CT 05/17/18 --Previous 1/16 CT scan noted 4.3 cm ascending aortic aneurysm, not noted on most recent echo or 04/2019 CT. Heart rate and blood pressure control recommended. Continue Lipitor 51mg 6my with Eliquis in place of ASA.    Continue to work on smoking cessation. Avoid heavy lifting and FQ.  Carotid artery diseases/p  Rcarotid endarterectomy --Followed by [facility].  No reported amaurosis fugax. Smokingcessation, as well as BP/heart rate control.Continue Lipitor 76m. Continue Eliquis in place of ASA. Risk factor modification recommended.  Current smoker(1/2-1 packs / day) Nodule of the left lung --L lung nodule.  Per 04/02/2019 CT, lung RADS 2 with benign appearance of behavior. Recommendation on CT scan report to continue annual screening with low-dose chest CT without contrast every 12 months. --Smoking cessation advised.  She was prescribed Chantix; however, on reading on side effects, she prefers to continue to work on smoking cessation on her own.  She continues to want to quit smoking and is working towards that goal.  Reviewed barriers to smoking cessation.  Medication changes: None. Labs ordered: CBC to be ordered per PCP, given the patient's insurance coverage.  Message sent to PCP. Studies / Imaging ordered: None.  Periodic echo to monitor her valve. Future considerations: At RTC, reassess progress with smoking cessation.  Reassess desire for sleep study.  Encourage repeat imaging with low threshold for TEE for her aortic valve, given her harsh murmur on exam. Disposition: RTC 3 months.    JArvil Chaco PA-C 01/14/2020

## 2020-01-14 NOTE — Patient Instructions (Signed)
Medication Instructions:  Your physician recommends that you continue on your current medications as directed. Please refer to the Current Medication list given to you today.  *If you need a refill on your cardiac medications before your next appointment, please call your pharmacy*   Lab Work: none ordered If you have labs (blood work) drawn today and your tests are completely normal, you will receive your results only by: Marland Kitchen MyChart Message (if you have MyChart) OR . A paper copy in the mail If you have any lab test that is abnormal or we need to change your treatment, we will call you to review the results.   Testing/Procedures: none ordered  Follow-Up: At [facility], you and your health needs are our priority.  As part of our continuing mission to provide you with exceptional heart care, we have created designated Provider Care Teams.  These Care Teams include your primary Cardiologist (physician) and Advanced Practice Providers (APPs -  Physician Assistants and Nurse Practitioners) who all work together to provide you with the care you need, when you need it.  We recommend signing up for the patient portal called "MyChart".  Sign up information is provided on this After Visit Summary.  MyChart is used to connect with patients for Virtual Visits (Telemedicine).  Patients are able to view lab/test results, encounter notes, upcoming appointments, etc.  Non-urgent messages can be sent to your provider as well.   To learn more about what you can do with MyChart, go to [email or web address].    Your next appointment:   3 month(s)  The format for your next appointment:   In Person  Provider:     You may see Dr. Fletcher Anon or [name], PA-C

## 2020-01-17 ENCOUNTER — Other Ambulatory Visit: Payer: Self-pay

## 2020-01-17 ENCOUNTER — Ambulatory Visit (INDEPENDENT_AMBULATORY_CARE_PROVIDER_SITE_OTHER): Payer: Medicare Other | Admitting: Family

## 2020-01-17 ENCOUNTER — Encounter: Payer: Self-pay | Admitting: Family

## 2020-01-17 VITALS — BP 130/82 | HR 74 | Temp 98.0°F | Ht 65.0 in | Wt 159.2 lb

## 2020-01-17 DIAGNOSIS — I471 Supraventricular tachycardia: Secondary | ICD-10-CM | POA: Diagnosis not present

## 2020-01-17 DIAGNOSIS — I10 Essential (primary) hypertension: Secondary | ICD-10-CM

## 2020-01-17 DIAGNOSIS — D751 Secondary polycythemia: Secondary | ICD-10-CM | POA: Diagnosis not present

## 2020-01-17 DIAGNOSIS — Z23 Encounter for immunization: Secondary | ICD-10-CM

## 2020-01-17 LAB — CBC WITH DIFFERENTIAL/PLATELET
Basophils Absolute: 0.1 10*3/uL (ref 0.0–0.1)
Basophils Relative: 0.9 % (ref 0.0–3.0)
Eosinophils Absolute: 0.2 10*3/uL (ref 0.0–0.7)
Eosinophils Relative: 2.3 % (ref 0.0–5.0)
HCT: 44.7 % (ref 36.0–46.0)
Hemoglobin: 15.2 g/dL — ABNORMAL HIGH (ref 12.0–15.0)
Lymphocytes Relative: 26.2 % (ref 12.0–46.0)
Lymphs Abs: 2.1 10*3/uL (ref 0.7–4.0)
MCHC: 34.1 g/dL (ref 30.0–36.0)
MCV: 91.9 fl (ref 78.0–100.0)
Monocytes Absolute: 0.6 10*3/uL (ref 0.1–1.0)
Monocytes Relative: 8 % (ref 3.0–12.0)
Neutro Abs: 5.1 10*3/uL (ref 1.4–7.7)
Neutrophils Relative %: 62.6 % (ref 43.0–77.0)
Platelets: 210 10*3/uL (ref 150.0–400.0)
RBC: 4.86 Mil/uL (ref 3.87–5.11)
RDW: 13.8 % (ref 11.5–15.5)
WBC: 8.1 10*3/uL (ref 4.0–10.5)

## 2020-01-17 LAB — BASIC METABOLIC PANEL
BUN: 14 mg/dL (ref 6–23)
CO2: 31 mEq/L (ref 19–32)
Calcium: 9 mg/dL (ref 8.4–10.5)
Chloride: 104 mEq/L (ref 96–112)
Creatinine, Ser: 0.84 mg/dL (ref 0.40–1.20)
GFR: 66.72 mL/min (ref >60.00)
Glucose, Bld: 88 mg/dL (ref 70–99)
Potassium: 4.4 mEq/L (ref 3.5–5.1)
Sodium: 140 mEq/L (ref 135–145)

## 2020-01-17 NOTE — Progress Notes (Signed)
I agree with the details of the visit as noted by Wyn Quaker, NP.  Renold Don, MD [facility]

## 2020-01-17 NOTE — Assessment & Plan Note (Signed)
Stable .continue diltiazem. Will folllow

## 2020-01-17 NOTE — Progress Notes (Signed)
Subjective:    Patient ID: Tracy Bell, female    DOB: Sep 22, 1950, 69 y.o.   MRN: [id number]  CC: Tracy Bell is a 69 y.o. female who presents today for follow up.   HPI: Feels well today No new concerns  H/o SVT    since starting increased diltiazem, feels medication is  'working much better' .  Able to work in yard all last weekend in yard, pulling weeds and burning trash. No palpitations, dizziness, sob with activity.   'spells' are less frequent since starting diltiazem.   She notes yesterday at 10am had an episode in which lasted  for 45 minutes  Felt palpitations and took her blood pressure 100/ 80, HR 131. Started to deep breath  and 'spell' resolved.  She still isnt not sure she would like to pursue an ablation.   No associated CP, left arm numbness, SOB when she has palpitations. States 'episode' is never painful.   HLD - compliant with lipitor 80mg    HTN- no longer on lisinopril. On increased dose of diltiazem since 12/23/19.    [facility] cardiology 01/14/20 for atrial flutter , continued diltiazem 240mg , eliquis 5mg  BID   Known aortic stenosis     HISTORY:  Past Medical History:  Diagnosis Date  . Anxiety   . COPD (chronic obstructive pulmonary disease) ([place])   . Cough   . Depression   . Dizziness   . Dyspnea   . Heart murmur   . Hyperlipidemia   . Hypertension   . Lightheadedness   . Personal history of tobacco use, presenting hazards to health 08/28/2015   Past Surgical History:  Procedure Laterality Date  . ABDOMINAL HYSTERECTOMY  1980   complete  . BREAST BIOPSY  1970   normal  . DILATION AND CURETTAGE OF UTERUS    . ENDARTERECTOMY Right 07/12/2018   Procedure: ENDARTERECTOMY CAROTID;  Surgeon: Algernon Huxley, MD;  Location: [facility] ORS;  Service: Vascular;  Laterality: Right;  . TUBAL LIGATION     Family History  Problem Relation Age of Onset  . Cancer Mother        Pancreatic Cancer   . Cancer Father        Stomach Cancer   . Lung cancer  Sister   . Cancer Sister        ovarian  . Lung cancer Sister     Allergies: Patient has no known allergies. Current Outpatient Medications on File Prior to Visit  Medication Sig Dispense Refill  . apixaban (ELIQUIS) 5 MG TABS tablet Take 1 tablet (5 mg total) by mouth 2 (two) times daily. 60 tablet 11  . atorvastatin (LIPITOR) 80 MG tablet Take 1 tablet (80 mg total) by mouth daily. 90 tablet 3  . cholecalciferol (VITAMIN D3) 25 MCG (1000 UT) tablet Take 1,000 Units by mouth daily.    Marland Kitchen diltiazem (CARDIZEM CD) 240 MG 24 hr capsule Take 1 capsule (240 mg total) by mouth daily. 30 capsule 6  . Multiple Vitamins-Minerals (CENTRUM ADULTS) TABS Take 1 tablet by mouth daily. 30 tablet 6  . Tiotropium Bromide-Olodaterol (STIOLTO RESPIMAT) 2.5-2.5 MCG/ACT AERS Inhale 2 puffs into the lungs daily. 4 g 2   No current facility-administered medications on file prior to visit.    Social History   Tobacco Use  . Smoking status: Current Every Day Smoker    Packs/day: 2.00    Years: 60.00    Pack years: 120.00    Start date: 05/03/1963  . Smokeless  tobacco: Never Used  . Tobacco comment: 1-2 pks per day.  not smoking in the house any longer.  Vaping Use  . Vaping Use: Never used  Substance Use Topics  . Alcohol use: Yes    Alcohol/week: 0.0 standard drinks    Comment: [place] occasionally  . Drug use: No    Review of Systems  Constitutional: Negative for chills and fever.  Eyes: Negative for visual disturbance.  Respiratory: Negative for cough.   Cardiovascular: Positive for palpitations. Negative for chest pain and leg swelling.  Gastrointestinal: Negative for nausea and vomiting.  Neurological: Negative for dizziness and headaches.      Objective:    BP 130/82   Pulse 74   Temp 98 F (36.7 C)   Ht 5\' 5"  (1.651 m)   Wt 159 lb 3.2 oz (72.2 kg)   SpO2 96%   BMI 26.49 kg/m  BP Readings from Last 3 Encounters:  01/17/20 130/82  01/14/20 120/80  01/13/20 110/86   Wt  Readings from Last 3 Encounters:  01/17/20 159 lb 3.2 oz (72.2 kg)  01/14/20 157 lb 6 oz (71.4 kg)  01/13/20 157 lb 6.4 oz (71.4 kg)    Physical Exam Vitals reviewed.  Constitutional:      Appearance: She is well-developed.  Eyes:     Conjunctiva/sclera: Conjunctivae normal.  Cardiovascular:     Rate and Rhythm: Normal rate and regular rhythm.     Pulses: Normal pulses.     Heart sounds: Normal heart sounds.  Pulmonary:     Effort: Pulmonary effort is normal.     Breath sounds: Normal breath sounds. No wheezing, rhonchi or rales.  Skin:    General: Skin is warm and dry.  Neurological:     Mental Status: She is alert.  Psychiatric:        Speech: Speech normal.        Behavior: Behavior normal.        Thought Content: Thought content normal.        Assessment & Plan:   Problem List Items Addressed This Visit      Cardiovascular and Mediastinum   Essential hypertension    Stable .continue diltiazem. Will folllow      SVT (supraventricular tachycardia) ([place]) - Primary    Symptomatically improved, and she is pleased with diltiazem 240mg . advised to continue to discuss with cardiology symptom management , need for ablation.       Relevant Orders   CBC with Differential/Platelet (Completed)   Basic metabolic panel (Completed)     Other   Erythrocytosis    Stable; Hemoglobin 15.2 ; will monitor and advised patient to have cbc drawn every 3 months       Other Visit Diagnoses    Need for immunization against influenza       Relevant Orders   Flu Vaccine QUAD High Dose(Fluad) (Completed)       I have discontinued Savvy M. Blansett's varenicline. I am also having her maintain her Centrum Adults, cholecalciferol, Stiolto Respimat, apixaban, diltiazem, and atorvastatin.   No orders of the defined types were placed in this encounter.   Return precautions given.   Risks, benefits, and alternatives of the medications and treatment plan prescribed today were  discussed, and patient expressed understanding.   Education regarding symptom management and diagnosis given to patient on AVS.  Continue to follow with Burnard Hawthorne, FNP for routine health maintenance.   Nashla M Gillies and I agreed with plan.  [name], FNP

## 2020-01-17 NOTE — Assessment & Plan Note (Addendum)
Stable; Hemoglobin 15.2 ; will monitor and advised patient to have cbc drawn every 3 months

## 2020-01-17 NOTE — Patient Instructions (Addendum)
We need to order bone density, mammogram and have pneumonia vaccine ( prevnar 13) ; let me know when you are ready to do this.   Always so nice to see you!

## 2020-01-17 NOTE — Assessment & Plan Note (Signed)
Symptomatically improved, and she is pleased with diltiazem 240mg . advised to continue to discuss with cardiology symptom management , need for ablation.

## 2020-01-20 NOTE — Progress Notes (Signed)
Patient called and informed that she just needs to follow with [place] every 3 months for now. Pt has f/u for 12/20

## 2020-01-30 ENCOUNTER — Other Ambulatory Visit: Payer: Self-pay | Admitting: Pulmonary Disease

## 2020-02-13 ENCOUNTER — Encounter: Payer: Self-pay | Admitting: Family

## 2020-02-13 NOTE — Telephone Encounter (Signed)
Pt returned call and said she would go to an Urgent Care in the morning but she would like a call back today.

## 2020-02-13 NOTE — Telephone Encounter (Signed)
I called & left patient very detailed message. I advised that she PLEASE go to UC asap. We unfortunately do not have any appointments & patient needed to be seen sooner than she could here.  I was unsure of what patient was dealing with, but given heat that she really needed to be seen urgently. I asked that she call us back & let us know if she would go to UC.

## 2020-02-14 ENCOUNTER — Telehealth: Payer: Self-pay

## 2020-02-14 DIAGNOSIS — R220 Localized swelling, mass and lump, head: Secondary | ICD-10-CM | POA: Diagnosis not present

## 2020-02-14 DIAGNOSIS — T7840XA Allergy, unspecified, initial encounter: Secondary | ICD-10-CM | POA: Diagnosis not present

## 2020-02-14 NOTE — Telephone Encounter (Signed)
LMTCB I was concerned about patient & cannot see where she went to UC. Wanted to see how her face was & try to convince her to be seen if she di dnot end up going today.

## 2020-02-14 NOTE — Telephone Encounter (Signed)
Patient returned my call & stated that she did go to [facility] walk-in today. The physician she saw there thought that pt had some type of allergic reaction. Pt has not changed anything & really didn't agree. He prescribed cefdinir & prednisone. Pt stated that she would try to see if they helped. I wanted to see if patient could come in office, but she had a fever two days ago so she could not per protocol. I advised that any worsening sx or swelling to please go to a [facility] or ED for opinion & to be evaluated. Pt stated that she  Would & update [place] next week.

## 2020-03-23 ENCOUNTER — Other Ambulatory Visit: Payer: Self-pay | Admitting: *Deleted

## 2020-03-23 DIAGNOSIS — Z87891 Personal history of nicotine dependence: Secondary | ICD-10-CM

## 2020-03-23 DIAGNOSIS — Z122 Encounter for screening for malignant neoplasm of respiratory organs: Secondary | ICD-10-CM

## 2020-03-23 NOTE — Progress Notes (Signed)
Contacted and scheduled for lung screening scan. Patient is a current smoker, 58 pack year.

## 2020-04-03 ENCOUNTER — Other Ambulatory Visit: Payer: Self-pay

## 2020-04-03 ENCOUNTER — Telehealth: Payer: Self-pay | Admitting: *Deleted

## 2020-04-03 ENCOUNTER — Ambulatory Visit
Admission: RE | Admit: 2020-04-03 | Discharge: 2020-04-03 | Disposition: A | Discharge status: Home / Self Care | Payer: Medicare Other | Source: Ambulatory Visit | Attending: Nurse Practitioner | Admitting: Nurse Practitioner

## 2020-04-03 DIAGNOSIS — Z122 Encounter for screening for malignant neoplasm of respiratory organs: Secondary | ICD-10-CM | POA: Diagnosis not present

## 2020-04-03 DIAGNOSIS — Z87891 Personal history of nicotine dependence: Secondary | ICD-10-CM | POA: Insufficient documentation

## 2020-04-03 NOTE — Telephone Encounter (Signed)
Called Report  IMPRESSION: 1. Interval development of new left upper and lower lobe pulmonary nodules. Lung-RADS 4A, suspicious. Follow up low-dose chest CT without contrast in 3 months (please use the following order, "CT CHEST LCS NODULE FOLLOW-UP W/O CM") is recommended. Alternatively, PET may be considered when there is a solid component 51mm or larger. 2. Ascending thoracic aorta measures 4.1 cm. Attention on follow-up annual lung cancer screening CT recommended. This recommendation follows 2010 ACCF/AHA/AATS/ACR/ASA/SCA/SCAI/SIR/STS/SVM Guidelines for the Diagnosis and Management of Patients with Thoracic Aortic Disease. Circulation. 2010; 121: J423-Q009. Aortic aneurysm NOS (ICD10-I71.9) 3. Aortic Atherosclerosis (ICD10-I70.0) and Emphysema (ICD10-J43.9).  These results will be called to the ordering clinician or representative by the Radiologist Assistant, and communication documented in the PACS or [employer].   Electronically Signed   By: Misty Stanley M.D.   On: 04/03/2020 13:35

## 2020-04-07 ENCOUNTER — Telehealth: Payer: Self-pay | Admitting: *Deleted

## 2020-04-07 ENCOUNTER — Other Ambulatory Visit: Payer: Self-pay | Admitting: *Deleted

## 2020-04-07 DIAGNOSIS — R918 Other nonspecific abnormal finding of lung field: Secondary | ICD-10-CM

## 2020-04-07 DIAGNOSIS — Z87891 Personal history of nicotine dependence: Secondary | ICD-10-CM

## 2020-04-07 NOTE — Progress Notes (Signed)
sched for lcs nodule follow up. Patient is a current smoker with a 58 pack year history

## 2020-04-07 NOTE — Telephone Encounter (Signed)
Notified patient of LDCT lung cancer screening program results with recommendation for 3 month follow up imaging. Also notified of incidental findings noted below and is encouraged to discuss further with PCP who will receive a copy of this note and/or the CT report. Patient verbalizes understanding.   IMPRESSION: 1. Interval development of new left upper and lower lobe pulmonary nodules. Lung-RADS 4A, suspicious. Follow up low-dose chest CT without contrast in 3 months (please use the following order, "CT CHEST LCS NODULE FOLLOW-UP W/O CM") is recommended. Alternatively, PET may be considered when there is a solid component 57mm or larger. 2. Ascending thoracic aorta measures 4.1 cm. Attention on follow-up annual lung cancer screening CT recommended. This recommendation follows 2010 ACCF/AHA/AATS/ACR/ASA/SCA/SCAI/SIR/STS/SVM Guidelines for the Diagnosis and Management of Patients with Thoracic Aortic Disease. Circulation. 2010; 121: F901-Q224. Aortic aneurysm NOS (ICD10-I71.9) 3. Aortic Atherosclerosis (ICD10-I70.0) and Emphysema (ICD10-J43.9).

## 2020-04-08 ENCOUNTER — Telehealth: Payer: Self-pay | Admitting: Family

## 2020-04-08 NOTE — Telephone Encounter (Signed)
Call pt Rec'ed results from CT Chest  1) repeat ct chest in 3 months as coordinated by shawn perkins ; it has been ordered. Please ensure pt gets called to schedule this based on new pulmonary nodule   2)  She needs to continue to have annual follow up with Dr dew , vascular regarding thoracic AND abdominal aneurysm surveillance. Please ensure she keeps follow up scheduled with him 09/2020.   ct chest 2020- 3.9 cm ascending thoracic aorta which would represent the upper limits of normal.   Ct chest 2021-Ascending thoracic aorta measures 4.1 cm  3) atherosclerosis noted again; please continue lipitor 80mg 

## 2020-04-08 NOTE — Telephone Encounter (Signed)
I spoke with patient & she stated that she already spoke with Burgess Estelle. She repeat CR scheduled & also has Dr. Lucky Cowboy appointment written down & on her books. She will see [place] 12/20.

## 2020-04-16 ENCOUNTER — Other Ambulatory Visit: Payer: Self-pay

## 2020-04-16 ENCOUNTER — Encounter: Payer: Self-pay | Admitting: Physician Assistant

## 2020-04-16 ENCOUNTER — Ambulatory Visit: Payer: Medicare Other | Admitting: Physician Assistant

## 2020-04-16 VITALS — BP 160/100 | HR 67 | Ht 66.0 in | Wt 161.0 lb

## 2020-04-16 DIAGNOSIS — I4892 Unspecified atrial flutter: Secondary | ICD-10-CM

## 2020-04-16 DIAGNOSIS — I7121 Aneurysm of the ascending aorta, without rupture: Secondary | ICD-10-CM

## 2020-04-16 DIAGNOSIS — I2510 Atherosclerotic heart disease of native coronary artery without angina pectoris: Secondary | ICD-10-CM | POA: Diagnosis not present

## 2020-04-16 DIAGNOSIS — R42 Dizziness and giddiness: Secondary | ICD-10-CM

## 2020-04-16 DIAGNOSIS — I471 Supraventricular tachycardia: Secondary | ICD-10-CM | POA: Diagnosis not present

## 2020-04-16 DIAGNOSIS — R911 Solitary pulmonary nodule: Secondary | ICD-10-CM

## 2020-04-16 DIAGNOSIS — E785 Hyperlipidemia, unspecified: Secondary | ICD-10-CM | POA: Diagnosis not present

## 2020-04-16 DIAGNOSIS — J449 Chronic obstructive pulmonary disease, unspecified: Secondary | ICD-10-CM

## 2020-04-16 DIAGNOSIS — Z720 Tobacco use: Secondary | ICD-10-CM

## 2020-04-16 DIAGNOSIS — R002 Palpitations: Secondary | ICD-10-CM

## 2020-04-16 DIAGNOSIS — I441 Atrioventricular block, second degree: Secondary | ICD-10-CM

## 2020-04-16 DIAGNOSIS — I712 Thoracic aortic aneurysm, without rupture: Secondary | ICD-10-CM

## 2020-04-16 DIAGNOSIS — I10 Essential (primary) hypertension: Secondary | ICD-10-CM

## 2020-04-16 NOTE — Patient Instructions (Signed)
Medication Instructions:  - Your physician recommends that you continue on your current medications as directed. Please refer to the Current Medication list given to you today.   - Samples Given: Eliquis 5 mg Lot: [id number] Exp: 06/2021 # 2 boxes  *If you need a refill on your cardiac medications before your next appointment, please call your pharmacy*   Lab Work: - none ordered  If you have labs (blood work) drawn today and your tests are completely normal, you will receive your results only by: Marland Kitchen MyChart Message (if you have MyChart) OR . A paper copy in the mail If you have any lab test that is abnormal or we need to change your treatment, we will call you to review the results.   Testing/Procedures: - none ordered   Follow-Up: At [facility], you and your health needs are our priority.  As part of our continuing mission to provide you with exceptional heart care, we have created designated Provider Care Teams.  These Care Teams include your primary Cardiologist (physician) and Advanced Practice Providers (APPs -  Physician Assistants and Nurse Practitioners) who all work together to provide you with the care you need, when you need it.  We recommend signing up for the patient portal called "MyChart".  Sign up information is provided on this After Visit Summary.  MyChart is used to connect with patients for Virtual Visits (Telemedicine).  Patients are able to view lab/test results, encounter notes, upcoming appointments, etc.  Non-urgent messages can be sent to your provider as well.   To learn more about what you can do with MyChart, go to [email or web address].    Your next appointment:   3 month(s)  The format for your next appointment:   In Person  Provider:   You may see Kathlyn Sacramento, MD or one of the following Advanced Practice Providers on your designated Care Team:    Murray Hodgkins, NP  Christell Faith, PA-C  [name], PA-C  Cadence Trinidad,  [place]  Laurann Montana, NP    Other Instructions  1) Please send a MyChart message in 1 week to [place], [place] of your blood pressure readings over the last week.  - please take all readings at least 1 hour after you have had your medications

## 2020-04-16 NOTE — Progress Notes (Signed)
Week   Office Visit    Patient Name: Tracy Bell Date of Encounter: 04/16/2020  Primary Care Provider:  Burnard Hawthorne, FNP Primary Cardiologist:  Dr. Fletcher Anon  Chief Complaint    Chief Complaint  Patient presents with  . Follow-up    3 month F/U; Meds verbally reviewed with patient.   69 year old female with history of SVT, new atrial flutter (8/23) with variable block, Mobitz I (Wenckebach), COPD, hyperlipidemia, aortic stenosis, AAA (3.2cm, upper limits of normal with recommendation for recheck 09/2020), hypertension, HLD, carotid disease s/p right carotid endarterectomy (07/2018), tobacco use (1-2 packs daily), and seen today for 3 month follow-up.  Past Medical History    Past Medical History:  Diagnosis Date  . Anxiety   . COPD (chronic obstructive pulmonary disease) ([place])   . Cough   . Depression   . Dizziness   . Dyspnea   . Heart murmur   . Hyperlipidemia   . Hypertension   . Lightheadedness   . Personal history of tobacco use, presenting hazards to health 08/28/2015   Past Surgical History:  Procedure Laterality Date  . ABDOMINAL HYSTERECTOMY  1980   complete  . BREAST BIOPSY  1970   normal  . DILATION AND CURETTAGE OF UTERUS    . ENDARTERECTOMY Right 07/12/2018   Procedure: ENDARTERECTOMY CAROTID;  Surgeon: Algernon Huxley, MD;  Location: [facility] ORS;  Service: Vascular;  Laterality: Right;  . TUBAL LIGATION      Allergies  No Known Allergies  History of Present Illness    Tracy Bell is a 69 y.o. female with PMH as above.  She was seen in the past by Dr. Saralyn Pilar. Previous CT scan showed evidence of CAC.   She underwent a stress echo in 12/2015, during which she exercised for 4 minutes and 36 seconds without CP or EKG changes. Echo showed nl LVSF with mild AS and no evidence of ischemia.   She reported palpitations in 2019 with 72 hour monitor performed and showing NSR with frequent PACs with occasional brief atrial runs, the longest of which  lasted 7 beats.   04/2018 echo showed nl LVSF, mild to moderate AS with mean gradient 55mHg and valve area 1.16.   At her outpatient visit 05/17/19, she reported intermittent palpitations and tachycardia, as well as pulsation on the right side of her neck. Associated sx included mild dizziness, diaphoresis, and mild SOB.  Zio was performed and showed 100 SVT runs, with the fastest lasting 1 min 26 seconds with max rate of 197bpm and the longest 50 min and 54 seconds with average rate of 144 bm. The MD notification criteria for SVT was met. Also noted was second degree AVB - Mobitz I (Wenckebach) with sleep study recommended at RTC but politely declined per patient.   Updated echo as below showed EF 60-65%, G2DD, mild TR, mild to moderate AS with mean gradient 136mg and valve area 1.4cm2. Mildly elevated PASP was also noted with RVSP 32.26m44m. Given her normal EF and SVT, she was transitioned from amlodipine to diltiazem for better control of her sx.   When seen at subsequent visits, she noted ongoing racing HR, palpitations, dizziness, and R sided neck pulsation. She felt occasional nausea or as if something was pressing on the front of her throat. She was still smoking. She occasionally added salt to her food and avoiding caffeine past the AM and all alcohol.  She was very good with monitoring her BP closely at home. Lipitor was  increased to 94m. Smoking cessation was still encouraged.  Lisinopril dose and diltiazem doses also subsequently increased at subsequent clinic visits.  Of note, she has also seen vascular surgery with recommendation to continue to monitor her AAA dilation, which is at the upper limits of normal, and with recommendation for duplex recheck in 1 year or ~ 09/2020. They will repeat a carotid duplex at her next RTC.   Seen in clinic with new atrial flutter and started on anticoagulation with diltiazem dose increased and lisinopril held.  She reported fatigue, DOE, palpitations, and  dysphagia, sometimes choking on her spit. She continued to defer GI referral.  She was working on quitting smoking . EP referral for ablation versus DCCV again discussed with pt understanding and preference to continue to defer.   In 01/2020, she returned to clinic and felt well with improved swallowing.  She was prescribed Chantix to quit smoking; however, on reading the side effects, has declined to start this medication.  She continues to politely decline sleep study and GI referral.  On 04/03/2020, she underwent repeat CT scan that showed interval development of new left upper lobe and lower lobe pulmonary nodules.  Lungs frets foray.  Recommendation was for follow-up chest CT without contrast in 3 months.  PET scan was noted to also be a possibility for repeat imaging.  Her ascending thoracic aorta was measured at 4.1 cm with recommendation to closely follow on repeat imaging within the next 3 months.  Aortic and coronary atherosclerosis and emphysema were noted.  Today, 04/16/2020, she returns to clinic and reports she is doing about the same when compared with her previous clinic visits.  She has not been monitoring her blood pressure as well as she has at previous visit with BP today 160/100. She did not take her diltiazem this AM but agreeable to send in her BP after taking her medications this week.  She does not know what her BP has been running. She will send a MyChart message.  We reviewed fluid and salt restrictions.  She is noted to be 4 pounds up from her previous clinic weight but denies any worsening symptoms of heart failure or volume overload with exam not consistent with overload.  She denies any chest pain at rest or with exertion.  No SOB at rest. DOE at baseline.  She notes increased cost of Eliquis, due to being in the donut hole, with samples provided today.  No s/sx of bleeding. No falls. She continues to decline further work-up with sleep study and lower extremity arterial studies  until out of her donut hole.  She continues to smoke at 1-1/2 packs/day and has not yet cut back.  She reports some dizziness with standing.  Over the last couple of weeks, she has noticed bright red lower extremities that are the same on both sides and not associated with any new shortness of breath or increased heart rate.  No asx swelling or change in temperature. No numbness or tingling of her lower extremities. No lower extremity weeping.   She reports medication compliance.  Home Medications    Current Outpatient Medications on File Prior to Visit  Medication Sig Dispense Refill  . apixaban (ELIQUIS) 5 MG TABS tablet Take 1 tablet (5 mg total) by mouth 2 (two) times daily. 60 tablet 11  . atorvastatin (LIPITOR) 80 MG tablet Take 1 tablet (80 mg total) by mouth daily. 90 tablet 3  . cholecalciferol (VITAMIN D3) 25 MCG (1000 UT) tablet Take 1,000  Units by mouth daily.    Marland Kitchen diltiazem (CARDIZEM CD) 240 MG 24 hr capsule Take 1 capsule (240 mg total) by mouth daily. 30 capsule 6  . Multiple Vitamins-Minerals (CENTRUM ADULTS) TABS Take 1 tablet by mouth daily. 30 tablet 6  . STIOLTO RESPIMAT 2.5-2.5 MCG/ACT AERS INHALE 2 PUFFS BY MOUTH INTO THE LUNGS DAILY 4 g 11   No current facility-administered medications on file prior to visit.     Review of Systems    She denies chest pain, palpitations, pnd, orthopnea, n, v, syncope, edema, weight gain, or early satiety. She reports dizziness with standing too quickly on occasion.  She notes redness of her bilateral lower extremities without any associated warmth, asymmetric swelling, palpable cord, signs or symptoms of infection.  She notes stable dyspnea with recent CT scan as above.  She has not been monitoring her BP well.  All other systems reviewed and are otherwise negative except as noted above.  Physical Exam    VS:  BP (!) 160/100 (BP Location: Left Arm, Patient Position: Sitting, Cuff Size: Normal)   Pulse 67   Ht '5\' 6"'  (1.676 m)   Wt 161  lb (73 kg)   SpO2 96%   BMI 25.99 kg/m  , BMI Body mass index is 25.99 kg/m. GEN: Well nourished, well developed, in no acute distress.  HEENT: normal. Neck: Supple, no JVD, carotid bruits, or masses. Cardiac: RRR, 3/6 systolic murmur best appreciated in RUSB and with radiation into the bilateral carotids. No rubs or gallops. No clubbing, cyanosis, edema. Bilateral erythema of lower extremities/ dependent rubor. Radials 2+ /DP/PT 1+ bilaterally.  Respiratory:  Coarse breath sounds bilaterally, slightly reduced bibasilar breath sounds. GI: Soft, nontender, nondistended, BS + x 4. MS: no deformity or atrophy. Skin: warm and dry, no rash. Neuro:  Strength and sensation are intact. Psych: Normal affect.  Accessory Clinical Findings    ECG personally reviewed by me today no EKG  VITALS Reviewed today   Temp Readings from Last 3 Encounters:  01/17/20 98 F (36.7 C)  01/13/20 97.7 F (36.5 C) (Temporal)  12/02/19 98.4 F (36.9 C)   BP Readings from Last 3 Encounters:  04/16/20 (!) 160/100  01/17/20 130/82  01/14/20 120/80   Pulse Readings from Last 3 Encounters:  04/16/20 67  01/17/20 74  01/14/20 67    Wt Readings from Last 3 Encounters:  04/16/20 161 lb (73 kg)  04/03/20 159 lb (72.1 kg)  01/17/20 159 lb 3.2 oz (72.2 kg)     LABS  reviewed today    Lab Results  Component Value Date   WBC 8.1 01/17/2020   HGB 15.2 (H) 01/17/2020   HCT 44.7 01/17/2020   MCV 91.9 01/17/2020   PLT 210.0 01/17/2020   Lab Results  Component Value Date   CREATININE 0.84 01/17/2020   BUN 14 01/17/2020   NA 140 01/17/2020   K 4.4 01/17/2020   CL 104 01/17/2020   CO2 31 01/17/2020   Lab Results  Component Value Date   ALT 19 05/15/2019   AST 21 05/15/2019   ALKPHOS 74 05/15/2019   BILITOT 0.8 05/15/2019   Lab Results  Component Value Date   CHOL 167 05/15/2019   HDL 58.10 05/15/2019   LDLCALC 90 05/15/2019   TRIG 93.0 05/15/2019   CHOLHDL 3 05/15/2019    Lab Results   Component Value Date   HGBA1C 5.8 03/23/2018   Lab Results  Component Value Date   TSH 3.05 03/23/2018  STUDIES/PROCEDURES reviewed today   Zio Predominantly NSR, HR 25-197 with average HR 65bpm. 100 SVT runs occurred. The run with the fastest interval lasted 1 minute and 26 seconds with maximum rate of 197 bpm, the longest lasting 50 min 54 seconds with an average rate of 144bpm. Second degree AVB Mobitz (Wenckebach) was present. SVT was detected within +- 45 seconds of symptomatic patient events. Isolated SVE were rare (<1.0%), SVE Couplets were rare (<1.0%), and SVE Triplets were rare (<1.0%). Isolated VE were rare (<1.0%), VE Couplets were rare (<1.0%), and no VE Triplets were present. MD notification for SVT met.   Echo 06/05/19 1. Left ventricular ejection fraction, by visual estimation, is 60 to  65%. The left ventricle has normal function. There is borderline left  ventricular hypertrophy.  2. Left ventricular diastolic parameters are consistent with Grade II  diastolic dysfunction (pseudonormalization).  3. The left ventricle has no regional wall motion abnormalities.  4. Global right ventricle has normal systolic function.The right  ventricular size is normal. Right vetricular wall thickness was not  assessed.  5. Left atrial size was normal.  6. Right atrial size was normal.  7. The mitral valve is normal in structure. No evidence of mitral valve  regurgitation.  8. The tricuspid valve is normal in structure.  9. The tricuspid valve is normal in structure. Tricuspid valve  regurgitation is mild.  10. The aortic valve is abnormal. Aortic valve regurgitation is not  visualized. Mild to moderate aortic valve stenosis.  11. Peak AV gradient 28mHg, MG 142mg, DVI 0.45, with LVOT diameter of  2cm, AVA 1.4cm2.  12. The pulmonic valve was not well visualized. Pulmonic valve  regurgitation is not visualized.  13. Mildly elevated pulmonary artery systolic pressure.   14. The inferior vena cava is normal in size with greater than 50%  respiratory variability, suggesting right atrial pressure of 3 mmHg.    Assessment & Plan    Paroxysmal Atrial Flutter with variable block and RVR History of SVT --Improvement in tachypalpitations with diltiazem 24018m RRR on exam. No EKG performed today.  --Continue on Eliquis 5 mg twice daily given CHA2DS2VASc score of at least 5 (CHF, HTN, agex1, vascular, female).  She denies any signs or symptoms of bleeding.  She reports compliance. Will provide with Eliquis samples today, given she is in the donut hole.  --She continues to politely decline sleep study, given current insurance coverage.  Reassess at RTC.   --No medication changes or further work-up at this time.  Mobitz I (Wenckebach) Possible sleep apnea --Continue to recommend a sleep study, which she has politely deferred until out of the donut hole.Pulmonology referral provided at previous visits.  Aortic stenosis, mild to moderate --Stable DOE. Most recent echo as above with mild to moderate AS and abnormal valve. Harsh murmur on exam. Dizziness with standing. She continues to defer further workup +/- referral to the structural heart team at this time.   Coronary artery disease involving the native coronary arteries on CT --No recent or current CP and stable DOE. Recent 04/03/20 CT with coronary and aortic atherosclerosis.She does have significant risk factors for coronary artery disease, including current smoker. Continue atorvastatin 65m44m and Eliquis in lieu of ASA.Risk factor modification discussed / lifestyle changes.  Smoking cessation encouraged with patient working towards this goal.  HLD, goal LDL below 70 --Continue high intensity statin with Lipitor 65mg35mly.  LDL goal <70. Continue Eliquis in lieu of ASA. Lifestyle changes discussed, including smoking cessation.  Defer to PCP regarding annual monitoring of lipids.  Essential  hypertension, poorly controlled (no medications taken today) --BP elevated, which she attributes to not taking her diltiazem this AM.  Wt up from that of previous clinic wt, though euvolemic on exam. She will send some pressures via MyChart once home and taking her medication. Recommend also monitoring of daily wts. Reviewed s/sx of volume overload. Preference is to avoid any medication changes until out of the donut hole. Reviewed fluid and salt restrictions. She continues to work on quitting smoking. She politely declined sleep study. Increase activity as tolerated.  Continue current diltiazem 240 mg daily.  Ascending aortic aneurysm, 4.1cm by most recent non-contrast CT --Previous 05/18/19 CT scan noted 4.3 cm ascending aortic aneurysm.  Recent 04/2020 CT of chest for lung cancer screening measured thoracic aorta at 4.1cm with recommendation for close monitoring during repeat scan within 3 months. Will loop in Dr. Fletcher Anon. Heart rate and blood pressure control recommended. Continue Lipitor 47m daily with Eliquis in place of ASA.  Continue to work on smoking cessation. Avoid heavy lifting and FQ.Follow with [facility] as scheduled.  Carotid artery diseases/p Rcarotid endarterectomy --Followed by [facility].  No reported amaurosis fugax. Smokingcessation, as well as BP/heart rate control.Continue Lipitor 840m Continue Eliquis in place of ASA. Risk factor modification recommended.  Current smoker(1.5 packs / day) Pulmonary nodules --Recent updated lung screening CT scan with tiny bilateral pulmonary nodules and development of new 7.56m40module of medial left upper lobe, as well as new irregular left lower lobe nodule measuring 6.3mm80mung -RADS 4A with recommendation for follow-up low dose chest CT without contrast in 3 months. PET scan was also noted to be considered. Smoking cessation advised.  She was prescribed Chantix; however, on reading on side effects, she prefers to continue to work on smoking  cessation on her own.  Increased tobacco use from previous visit, though she continues to want to quit smoking and is working towards that goal.  Reviewed barriers to smoking cessation.  Possible PAD --Pt preference is to continue to defer scans at this time. Continue risk factor modification with high intensity statin. Once out of donut hole, recommend lower extremity arterial studies. She will call the office if any concerning or worsening sx.  Medication changes: None.  Eliquis samples provided. Labs ordered: None. Studies / Imaging ordered: None.  Once insurance allows, lower extremity arterial studies, further workup of her valve / repeat imaginig, repeat imaging of AAA as directed, and sleep study.  Future considerations: At RTC, reassess progress with smoking cessation.  Reassess desire for sleep study.  Encourage repeat imaging with low threshold for TEE for her aortic valve, given her harsh murmur on exam. Disposition: RTC 3 months. BP once back on her medications?    JacqArvil Chaco-C 04/16/2020

## 2020-04-20 ENCOUNTER — Ambulatory Visit (INDEPENDENT_AMBULATORY_CARE_PROVIDER_SITE_OTHER): Payer: Medicare Other | Admitting: Family

## 2020-04-20 ENCOUNTER — Encounter: Payer: Self-pay | Admitting: Family

## 2020-04-20 ENCOUNTER — Other Ambulatory Visit: Payer: Self-pay

## 2020-04-20 DIAGNOSIS — I10 Essential (primary) hypertension: Secondary | ICD-10-CM

## 2020-04-20 DIAGNOSIS — I712 Thoracic aortic aneurysm, without rupture, unspecified: Secondary | ICD-10-CM

## 2020-04-20 DIAGNOSIS — F17200 Nicotine dependence, unspecified, uncomplicated: Secondary | ICD-10-CM

## 2020-04-20 DIAGNOSIS — I471 Supraventricular tachycardia: Secondary | ICD-10-CM

## 2020-04-20 DIAGNOSIS — E7800 Pure hypercholesterolemia, unspecified: Secondary | ICD-10-CM | POA: Diagnosis not present

## 2020-04-20 NOTE — Patient Instructions (Addendum)
We discussed Wellbutrin for smoking cessation.   Let me know when you are ready to try again and I will prescribe.  It is imperative that you are seen AT least twice per year for labs and monitoring. Monitor blood pressure at home and me 5-6 reading on separate days. Goal is less than  130/80;  if persistently higher, please make sooner follow up appointment so we can recheck you blood pressure and manage/ adjust medications.  Nice to see you!

## 2020-04-20 NOTE — Assessment & Plan Note (Signed)
Stable. Continue lipitor 80mg . Will check lipid panel in new year.

## 2020-04-20 NOTE — Assessment & Plan Note (Addendum)
Labile control.  She hasnt taken cardizem today and smoked a cigarette prior to visit which I suspect contributory. She has also declined OSA evaluation. She declines making medication changes at this time due to insurance. She would like to continue to monitor. We discussed that amount of cigarettes ( pack and half/day) very much contributory and agreed to focus our efforts on cessation to also help control blood pressure. I have also send [name] a message as well for advice in regards to medication changes.

## 2020-04-20 NOTE — Progress Notes (Signed)
Subjective:    Patient ID: Tracy Bell, female    DOB: 1950-09-21, 69 y.o.   MRN: [id number]  CC: Tracy Bell is a 69 y.o. female who presents today for follow up.   HPI: Feels well  No new complaints.   SVT-Blood pressure at home, 126/70, HR 66, 144/762, HR 63, yesterday 137/77, HR 70, 127/81, HR 67, 143/77, HR 61. Last irregular heartbeat was 6 weeks ago, twice in same day, resolved after sitting down. No associated CP.    compliant with cardizem 240mg . She hasnt taken cardizem this morning. She smoked a cigarette prior to visit.   Saw [name], PA last week.   Mild to moderate AS.   Atrial flutter- compliant with Eliquis 5mg   COPD- No SOB at rest. DOE at baseline. She is compliant with Stiolto; follows with Dr Patsey Berthold, Wyn Quaker, NP  HLD- compliant with lipitor  Follows with Dr Lucky Cowboy for ascending aortic aneurysm, carotid artery disease.   Current smoker, following lung nodules. CT Chest scheduled 07/02/2020.  Doesn't feel comfortable taking chantix. smoking 40+ cigarettes per day.  Would like to trial wellbutrin once she is out of 'donut hole'. No h/o seizure. Very rare alcohol use.       HISTORY:  Past Medical History:  Diagnosis Date  . Anxiety   . COPD (chronic obstructive pulmonary disease) ([place])   . Cough   . Depression   . Dizziness   . Dyspnea   . Heart murmur   . Hyperlipidemia   . Hypertension   . Lightheadedness   . Personal history of tobacco use, presenting hazards to health 08/28/2015   Past Surgical History:  Procedure Laterality Date  . ABDOMINAL HYSTERECTOMY  1980   complete  . BREAST BIOPSY  1970   normal  . DILATION AND CURETTAGE OF UTERUS    . ENDARTERECTOMY Right 07/12/2018   Procedure: ENDARTERECTOMY CAROTID;  Surgeon: Algernon Huxley, MD;  Location: [facility] ORS;  Service: Vascular;  Laterality: Right;  . TUBAL LIGATION     Family History  Problem Relation Age of Onset  . Cancer Mother        Pancreatic Cancer   . Cancer  Father        Stomach Cancer   . Lung cancer Sister   . Cancer Sister        ovarian  . Lung cancer Sister     Allergies: Patient has no known allergies. Current Outpatient Medications on File Prior to Visit  Medication Sig Dispense Refill  . apixaban (ELIQUIS) 5 MG TABS tablet Take 1 tablet (5 mg total) by mouth 2 (two) times daily. 60 tablet 11  . cholecalciferol (VITAMIN D3) 25 MCG (1000 UT) tablet Take 1,000 Units by mouth daily.    . Multiple Vitamins-Minerals (CENTRUM ADULTS) TABS Take 1 tablet by mouth daily. 30 tablet 6  . STIOLTO RESPIMAT 2.5-2.5 MCG/ACT AERS INHALE 2 PUFFS BY MOUTH INTO THE LUNGS DAILY 4 g 11  . atorvastatin (LIPITOR) 80 MG tablet Take 1 tablet (80 mg total) by mouth daily. 90 tablet 3  . diltiazem (CARDIZEM CD) 240 MG 24 hr capsule Take 1 capsule (240 mg total) by mouth daily. 30 capsule 6   No current facility-administered medications on file prior to visit.    Social History   Tobacco Use  . Smoking status: Current Every Day Smoker    Packs/day: 2.00    Years: 60.00    Pack years: 120.00    Start date:  05/03/1963  . Smokeless tobacco: Never Used  . Tobacco comment: 1.5-2 pks per day.  not smoking in the house any longer.  Vaping Use  . Vaping Use: Never used  Substance Use Topics  . Alcohol use: Yes    Alcohol/week: 0.0 standard drinks    Comment: [place] occasionally  . Drug use: No    Review of Systems  Constitutional: Negative for chills and fever.  Respiratory: Positive for shortness of breath (baseline). Negative for cough.   Cardiovascular: Positive for palpitations. Negative for chest pain and leg swelling.  Gastrointestinal: Negative for nausea and vomiting.      Objective:    BP (!) 142/80   Pulse 63   Temp 98.3 F (36.8 C)   Ht 5\' 6"  (1.676 m)   Wt 161 lb 3.2 oz (73.1 kg)   SpO2 97%   BMI 26.02 kg/m  BP Readings from Last 3 Encounters:  04/20/20 (!) 142/80  04/16/20 (!) 160/100  01/17/20 130/82   Wt Readings  from Last 3 Encounters:  04/20/20 161 lb 3.2 oz (73.1 kg)  04/16/20 161 lb (73 kg)  04/03/20 159 lb (72.1 kg)    Physical Exam Vitals reviewed.  Constitutional:      Appearance: She is well-developed and well-nourished.  Eyes:     Conjunctiva/sclera: Conjunctivae normal.  Cardiovascular:     Rate and Rhythm: Normal rate and regular rhythm.     Pulses: Normal pulses.     Heart sounds: Murmur heard.   Systolic murmur is present with a grade of 2/6.     Comments: SEM II/VI, Loudest LSB, non radiating, no thrill  Pulmonary:     Effort: Pulmonary effort is normal.     Breath sounds: Normal breath sounds. No wheezing, rhonchi or rales.  Skin:    General: Skin is warm and dry.  Neurological:     Mental Status: She is alert.  Psychiatric:        Mood and Affect: Mood and affect normal.        Speech: Speech normal.        Behavior: Behavior normal.        Thought Content: Thought content normal.        Assessment & Plan:   Problem List Items Addressed This Visit      Cardiovascular and Mediastinum   Essential hypertension    Labile control.  She hasnt taken cardizem today and smoked a cigarette prior to visit which I suspect contributory. She has also declined OSA evaluation. She declines making medication changes at this time due to insurance. She would like to continue to monitor. We discussed that amount of cigarettes ( pack and half/day) very much contributory and agreed to focus our efforts on cessation to also help control blood pressure. I have also send [name] a message as well for advice in regards to medication changes.       SVT (supraventricular tachycardia) (HCC)    At baseline. Continue cardizem.       Thoracic aortic aneurysm without rupture ([place])    Follows with vascular and follow up 09/2020, will follow.         Other   Hypercholesteremia    Stable. Continue lipitor 80mg . Will check lipid panel in new year.       Tobacco use disorder     We discussed wellbutrin. Patient will let me know when ready to prescribe.          I am having  [name] maintain her Centrum Adults, cholecalciferol, apixaban, diltiazem, atorvastatin, and Stiolto Respimat.   No orders of the defined types were placed in this encounter.   Return precautions given.   Risks, benefits, and alternatives of the medications and treatment plan prescribed today were discussed, and patient expressed understanding.   Education regarding symptom management and diagnosis given to patient on AVS.  Continue to follow with Burnard Hawthorne, FNP for routine health maintenance.   Nykayla M Shelton and I agreed with plan.   [name], FNP

## 2020-04-20 NOTE — Assessment & Plan Note (Addendum)
Follows with vascular and follow up 09/2020, will follow.

## 2020-04-20 NOTE — Assessment & Plan Note (Signed)
At baseline. Continue cardizem.

## 2020-04-20 NOTE — Assessment & Plan Note (Signed)
We discussed wellbutrin. Patient will let me know when ready to prescribe.

## 2020-04-29 ENCOUNTER — Telehealth: Payer: Self-pay | Admitting: Family

## 2020-04-29 ENCOUNTER — Other Ambulatory Visit: Payer: Self-pay

## 2020-04-29 DIAGNOSIS — I10 Essential (primary) hypertension: Secondary | ICD-10-CM

## 2020-04-29 MED ORDER — LOSARTAN POTASSIUM 25 MG PO TABS
25.0000 mg | ORAL_TABLET | Freq: Every day | ORAL | 3 refills | Status: DC
Start: 2020-04-29 — End: 2020-05-11

## 2020-04-29 NOTE — Telephone Encounter (Signed)
I spoke with patient & she was willing to start Losartan 25mg . I have sent to pharmacy. Pt is scheduled for BMP tomorrow & will start medication afterwards. Then she is scheduled for repeat BMP 1/6.

## 2020-04-29 NOTE — Telephone Encounter (Signed)
FYI Tracy Bell  Sarah,  Please call pt  Consulted with Tracy Bell and we discussed due to her elevated blood pressure if she would be willing to start losartan 25mg . This would be in addition to cardizem   If she is willing, I wil send in losartan 25mg  and she will need BMP lab BEFORE starting and one also one week after to monitor renal function, electrolytes.

## 2020-04-29 NOTE — Telephone Encounter (Signed)
noted 

## 2020-04-29 NOTE — Telephone Encounter (Signed)
-----   Message from Lennon Alstrom, PA-C sent at 04/23/2020  4:43 PM EST ----- Regarding: BP control Sorry for the delay in my response.  I was rounding in the hospital this week.  My only concern with adding a beta-blocker would be her underlying pulmonary disease and current tobacco use.   We could trial bisoprolol, as this is one of the better tolerated beta blockers for those with pulmonary conditions; however, it won't offer the same BP support as Coreg and her home rates on long acting Diltiazem look as if they are well controlled.    I would lean towards adding Losartan 25mg  if agreeable to a BMET at start and within 1 week of start. Other / future options (not involving labs) could be adding short acting low dose Cardizem 30mg  q6-8h as needed for higher BP.  I also considered addition of Imdur in the past, given her difficulty with swallowing she's reported at visits.    Do you want to try the Losartan? Will she be able to do the labs?  Thank you for following up on this and looping me in - I really appreciate it!   Happy holidays!  Sincerely, Jacquelyn    ----- Message ----- From: , FNP Sent: 04/21/2020  11:22 AM EST To: Allegra Grana, PA-C  She appears compliant with cardizem - just wish she would take on the days she sees 04/23/2020 ! :)   I was thinking prn too, at least it would make me feel better that she had something if persistently elevated  With her history of SVT, would a beta blocker be a consideration for prn use?  Coreg 3.125mg  BID PRN with food if blood pressure > 140/80, HR > 60.  She does have dizziness periodically so I would want to be gentle here.   Will absolutely lean to you here in regards to recommendation of PRN.Is there another route that would be better?     ----- Message ----- From: Lennon Alstrom, PA-C Sent: 04/20/2020   4:54 PM EST To: Lennon Alstrom, FNP  I worry so much about the stress on that heart valve.    I wonder if she's taking it consistently? She told me that she had not yet taken the Cardizem, because it was still early and she would later with food. I had her as a late AM appt.  Her pressures are much better on the Cardizem. We can always give her PRN medication for higher SBP, if agreeable?  ----- Message ----- From: 04/22/2020, FNP Sent: 04/20/2020   4:25 PM EST To: Allegra Grana, PA-C  She takes it at home, just not this morning.  Values at home are on cardizem  ----- Message ----- From: 04/22/2020, PA-C Sent: 04/20/2020   4:12 PM EST To: Lennon Alstrom, FNP  Was she taking Cardizem at home?  She did not take it prior to my visit as well.   ----- Message ----- From: 04/22/2020, FNP Sent: 04/20/2020  10:46 AM EST To: Allegra Grana, PA-C  Hi Jacquelyn, Patient seeing me today 142/80, HR 63. , hasnt taken cardizem today and smoked prior to visit. -Blood pressure at home, 126/70, HR 66, 144/762, HR 63, yesterday 137/77, HR 70, 127/81, HR 67, 143/77, HR 61.  Do you think appropriate to monitor?   Or medication changes?  Appreciate your thoughts here.   04/22/2020

## 2020-04-29 NOTE — Telephone Encounter (Signed)
Hi Margaret,   I'm covering Jacquelyn's box this week as she is out. Thanks for the update!  Alver Sorrow, NP

## 2020-04-30 ENCOUNTER — Other Ambulatory Visit: Payer: Self-pay

## 2020-04-30 ENCOUNTER — Other Ambulatory Visit (INDEPENDENT_AMBULATORY_CARE_PROVIDER_SITE_OTHER): Payer: Medicare Other

## 2020-04-30 DIAGNOSIS — I10 Essential (primary) hypertension: Secondary | ICD-10-CM

## 2020-04-30 LAB — BASIC METABOLIC PANEL
BUN: 17 mg/dL (ref 6–23)
CO2: 33 mEq/L — ABNORMAL HIGH (ref 19–32)
Calcium: 9.1 mg/dL (ref 8.4–10.5)
Chloride: 103 mEq/L (ref 96–112)
Creatinine, Ser: 0.85 mg/dL (ref 0.40–1.20)
GFR: 68.68 mL/min (ref >60.00)
Glucose, Bld: 107 mg/dL — ABNORMAL HIGH (ref 70–99)
Potassium: 4.4 mEq/L (ref 3.5–5.1)
Sodium: 138 mEq/L (ref 135–145)

## 2020-05-07 ENCOUNTER — Other Ambulatory Visit (INDEPENDENT_AMBULATORY_CARE_PROVIDER_SITE_OTHER): Payer: Medicare Other

## 2020-05-07 ENCOUNTER — Encounter: Payer: Self-pay | Admitting: Family

## 2020-05-07 ENCOUNTER — Other Ambulatory Visit: Payer: Self-pay

## 2020-05-07 ENCOUNTER — Other Ambulatory Visit: Payer: Self-pay | Admitting: Family

## 2020-05-07 DIAGNOSIS — I10 Essential (primary) hypertension: Secondary | ICD-10-CM | POA: Diagnosis not present

## 2020-05-07 LAB — BASIC METABOLIC PANEL
BUN: 17 mg/dL (ref 6–23)
CO2: 32 mEq/L (ref 19–32)
Calcium: 9.4 mg/dL (ref 8.4–10.5)
Chloride: 102 mEq/L (ref 96–112)
Creatinine, Ser: 0.94 mg/dL (ref 0.40–1.20)
GFR: 60.86 mL/min (ref >60.00)
Glucose, Bld: 115 mg/dL — ABNORMAL HIGH (ref 70–99)
Potassium: 4.1 mEq/L (ref 3.5–5.1)
Sodium: 140 mEq/L (ref 135–145)

## 2020-05-11 ENCOUNTER — Telehealth: Payer: Self-pay | Admitting: Family

## 2020-05-11 DIAGNOSIS — I10 Essential (primary) hypertension: Secondary | ICD-10-CM

## 2020-05-11 MED ORDER — LOSARTAN POTASSIUM 50 MG PO TABS: 50.0000 mg | ORAL_TABLET | Freq: Every day | ORAL | 1 refills | Status: DC

## 2020-05-11 NOTE — Telephone Encounter (Signed)
Pt scheduled for BMP in one week. Follow-up scheduled for 2/11. Pt will send BP after taking increased dose of Losartan in one week.

## 2020-05-11 NOTE — Telephone Encounter (Signed)
FYI Tracy  [name],   Please call pt Based on BPs she sent via mychart, I think reasonable to increase losartan to 50mg  with BMP in one week. Please sch  I have sent in 50mg  dose however she may use up 25mg  tablets of losartan by taking TWO tablets at a time.  She may send [place] BP reading after ONE week of being on medication.  Counsel her to be careful with position changes while we work to get BP more consistently < 130/80.  Ensure she has f/u scheduled with [place] too [name] in the next few weeks.

## 2020-05-13 NOTE — Telephone Encounter (Signed)
Hi Margaret, I'm covering Jacquelyn's box. Thanks for keeping [place] in the loop! Best, Loel Dubonnet, NP

## 2020-05-14 ENCOUNTER — Encounter: Payer: Self-pay | Admitting: Family

## 2020-05-18 ENCOUNTER — Other Ambulatory Visit: Payer: Medicare Other

## 2020-06-01 ENCOUNTER — Other Ambulatory Visit (INDEPENDENT_AMBULATORY_CARE_PROVIDER_SITE_OTHER): Payer: Medicare Other

## 2020-06-01 ENCOUNTER — Other Ambulatory Visit: Payer: Self-pay

## 2020-06-01 DIAGNOSIS — I10 Essential (primary) hypertension: Secondary | ICD-10-CM

## 2020-06-01 LAB — BASIC METABOLIC PANEL
BUN: 21 mg/dL (ref 6–23)
CO2: 30 mEq/L (ref 19–32)
Calcium: 9 mg/dL (ref 8.4–10.5)
Chloride: 104 mEq/L (ref 96–112)
Creatinine, Ser: 0.84 mg/dL (ref 0.40–1.20)
GFR: 69.62 mL/min (ref >60.00)
Glucose, Bld: 106 mg/dL — ABNORMAL HIGH (ref 70–99)
Potassium: 4.1 mEq/L (ref 3.5–5.1)
Sodium: 140 mEq/L (ref 135–145)

## 2020-06-11 ENCOUNTER — Encounter: Payer: Self-pay | Admitting: Family

## 2020-06-12 ENCOUNTER — Other Ambulatory Visit: Payer: Self-pay

## 2020-06-12 ENCOUNTER — Encounter: Payer: Self-pay | Admitting: Family

## 2020-06-12 ENCOUNTER — Ambulatory Visit (INDEPENDENT_AMBULATORY_CARE_PROVIDER_SITE_OTHER): Payer: Medicare Other | Admitting: Family

## 2020-06-12 DIAGNOSIS — I10 Essential (primary) hypertension: Secondary | ICD-10-CM | POA: Diagnosis not present

## 2020-06-12 DIAGNOSIS — E7800 Pure hypercholesterolemia, unspecified: Secondary | ICD-10-CM

## 2020-06-12 DIAGNOSIS — J449 Chronic obstructive pulmonary disease, unspecified: Secondary | ICD-10-CM

## 2020-06-12 DIAGNOSIS — M79605 Pain in left leg: Secondary | ICD-10-CM | POA: Diagnosis not present

## 2020-06-12 NOTE — Assessment & Plan Note (Signed)
SOB on exertion has worsened. NO acute respiratory distress today. Discussed progressive nature of COPD, continued smoking. Advised to discuss medications changes with Wyn Quaker at upcoming  follow up

## 2020-06-12 NOTE — Assessment & Plan Note (Signed)
No radicular symptoms. Differentials including trochanteric bursitis, osteoarthritis pelvis, SI joint. Patient declines xray at this time and would like to work on loosing weight which we agreed likely contributory. Advised baseline TSH, she declines at this time. Close follow up

## 2020-06-12 NOTE — Patient Instructions (Signed)
Heat, tylenol arthritis for hip pain  Let me know if doesn't improve with weight loss, exercise.   Ideas for low carb  This is  Dr. Lupita Dawn  example of a  "Low GI"  Diet:  It will allow you to lose 4 to 8  lbs  per month if you follow it carefully.  Your goal with exercise is a minimum of 30 minutes of aerobic exercise 5 days per week (Walking does not count once it becomes easy!)    All of the foods can be found at grocery stores and in bulk at [employer].  The Atkins protein bars and shakes are available in more varieties at Target, [employer] and [employer].     7 AM Breakfast:  Choose from the following:  Low carbohydrate Protein  Shakes (I recommend the  Premier Protein chocolate shakes,  EAS AdvantEdge "Carb Control" shakes  Or the Atkins shakes all are under 3 net carbs)     a scrambled egg/bacon/cheese burrito made with Mission's "carb balance" whole wheat tortilla  (about 10 net carbs )  Regulatory affairs officer (basically a quiche without the pastry crust) that is eaten cold and very convenient way to get your eggs.  8 carbs)  If you make your own protein shakes, avoid bananas and pineapple,  And use low carb greek yogurt or original /unsweetened almond or soy milk    Avoid cereal and bananas, oatmeal and cream of wheat and grits. They are loaded with carbohydrates!   10 AM: high protein snack:  Protein bar by Atkins (the snack size, under 200 cal, usually < 6 net carbs).    A stick of cheese:  Around 1 carb,  100 cal     Dannon Light n Fit [place] Yogurt  (80 cal, 8 carbs)  Other so called "protein bars" and Greek yogurts tend to be loaded with carbohydrates.  Remember, in food advertising, the word "energy" is synonymous for " carbohydrate."  Lunch:   A Sandwich using the bread choices listed, Can use any  Eggs,  lunchmeat, grilled meat or canned tuna), avocado, regular mayo/mustard  and cheese.  A Salad using blue cheese, ranch,  Goddess or vinagrette,  Avoid taco  shells, croutons or "confetti" and no "candied nuts" but regular nuts OK.   No pretzels, nabs  or chips.  Pickles and miniature sweet peppers are a good low carb alternative that provide a "crunch"  The bread is the only source of carbohydrate in a sandwich and  can be decreased by trying some of the attached alternatives to traditional loaf bread   Avoid "Low fat dressings, as well as [place] dressings They are loaded with sugar!   3 PM/ Mid day  Snack:  Consider  1 ounce of  almonds, walnuts, pistachios, pecans, peanuts,  Macadamia nuts or a nut medley.  Avoid "granola and granola bars "  Mixed nuts are ok in moderation as long as there are no raisins,  cranberries or dried fruit.   KIND bars are OK if you get the low glycemic index variety   Try the prosciutto/mozzarella cheese sticks by [employer]  In deli /backery section   High protein      6 PM  Dinner:     Meat/fowl/fish with a green salad, and either broccoli, cauliflower, green beans, spinach, brussel sprouts or  Lima beans. DO NOT BREAD THE PROTEIN!!      There is a low carb pasta by  Dreamfield's that is acceptable and tastes great: only 5 digestible carbs/serving.( All grocery stores but BJs carry it ) Several ready made meals are available low carb:   Try Michel Angelo's chicken piccata or chicken or eggplant parm over low carb pasta.(Lowes and BJs)   Marjory Lies Sanchez's "Carnitas" (pulled pork, no sauce,  0 carbs) or his beef pot roast to make a dinner burrito (at [employer]J's)  Pesto over low carb pasta (bj's sells a good quality pesto in the center refrigerated section of the deli   Try satueeing  Cheral Marker with mushroooms as a good side   [employer] Giant makes a mashed cauliflower that tastes like mashed potatoes  Whole wheat pasta is still full of digestible carbs and  Not as low in glycemic index as Dreamfield's.   Brown rice is still rice,  So skip the rice and noodles if you eat [place] or [place] (or at least limit to  1/2 cup)  9 PM snack :   Breyer's "low carb" fudgsicle or  ice cream bar (Carb Smart line), or  Weight Watcher's ice cream bar , or another "no sugar added" ice cream;  a serving of fresh berries/cherries with whipped cream   Cheese or DANNON'S LlGHT N FIT GREEK YOGURT  8 ounces of [place] unsweetened almond/cococunut milk    Treat yourself to a parfait made with whipped cream blueberiies, walnuts and vanilla greek yogurt  Avoid bananas, pineapple, grapes  and watermelon on a regular basis because they are high in sugar.  THINK OF THEM AS DESSERT  Remember that snack Substitutions should be less than 10 NET carbs per serving and meals < 20 carbs. Remember to subtract fiber grams to get the "net carbs."

## 2020-06-12 NOTE — Assessment & Plan Note (Signed)
Due to update lipid panel. Patient declines today. Continue lipitor 80mg  and we will discuss at follow up

## 2020-06-12 NOTE — Assessment & Plan Note (Signed)
Stable. Continue losartan 50mg , diltiazem 240mg .

## 2020-06-12 NOTE — Progress Notes (Signed)
Subjective:    Patient ID: Tracy Bell, female    DOB: 01/11/1951, 70 y.o.   MRN: [id number]  CC: Tracy Bell is a 70 y.o. female who presents today for follow up.   HPI: Left lateral hip pain, 6 months, comes and goes,unchanged.  Worse in morning and gets better as she walks.. She feeds the animals in the morning. Pain is worse after sitting for prolonged periods of time and going up steps. No pain when lays on left side.  Limping.  No falls or known injury.  No groin pain, trouble urinating or having bowel movement. No saddle anesthesia, fever, weight loss.  She has not taken pain medication or tried head. She has gained 16 lbs in a year. She is less active. She has been eating more sweets.   HLD- compliant with lipitor 80mg   HTN- compliant with losartan 50mg , diltiazem 240mg .   COPD- chronic cough unchanged. DOE  is worse.  No CP.  Compliant with Stiolto.   She declines labs today and xrays. Follow up with Wyn Quaker in 6 days.  Smoker  HISTORY:  Past Medical History:  Diagnosis Date  . Anxiety   . COPD (chronic obstructive pulmonary disease) ([place])   . Cough   . Depression   . Dizziness   . Dyspnea   . Heart murmur   . Hyperlipidemia   . Hypertension   . Lightheadedness   . Personal history of tobacco use, presenting hazards to health 08/28/2015   Past Surgical History:  Procedure Laterality Date  . ABDOMINAL HYSTERECTOMY  1980   complete  . BREAST BIOPSY  1970   normal  . DILATION AND CURETTAGE OF UTERUS    . ENDARTERECTOMY Right 07/12/2018   Procedure: ENDARTERECTOMY CAROTID;  Surgeon: Algernon Huxley, MD;  Location: [facility] ORS;  Service: Vascular;  Laterality: Right;  . TUBAL LIGATION     Family History  Problem Relation Age of Onset  . Cancer Mother        Pancreatic Cancer   . Cancer Father        Stomach Cancer   . Lung cancer Sister   . Cancer Sister        ovarian  . Lung cancer Sister     Allergies: Patient has no known allergies. Current  Outpatient Medications on File Prior to Visit  Medication Sig Dispense Refill  . apixaban (ELIQUIS) 5 MG TABS tablet Take 1 tablet (5 mg total) by mouth 2 (two) times daily. 60 tablet 11  . cholecalciferol (VITAMIN D3) 25 MCG (1000 UT) tablet Take 1,000 Units by mouth daily.    Marland Kitchen losartan (COZAAR) 50 MG tablet Take 1 tablet (50 mg total) by mouth daily. 90 tablet 1  . Multiple Vitamins-Minerals (CENTRUM ADULTS) TABS Take 1 tablet by mouth daily. 30 tablet 6  . STIOLTO RESPIMAT 2.5-2.5 MCG/ACT AERS INHALE 2 PUFFS BY MOUTH INTO THE LUNGS DAILY 4 g 11  . atorvastatin (LIPITOR) 80 MG tablet Take 1 tablet (80 mg total) by mouth daily. 90 tablet 3  . diltiazem (CARDIZEM CD) 240 MG 24 hr capsule Take 1 capsule (240 mg total) by mouth daily. 30 capsule 6   No current facility-administered medications on file prior to visit.    Social History   Tobacco Use  . Smoking status: Current Every Day Smoker    Packs/day: 2.00    Years: 60.00    Pack years: 120.00    Start date: 05/03/1963  . Smokeless tobacco:  Never Used  . Tobacco comment: 1.5-2 pks per day.  not smoking in the house any longer.  Vaping Use  . Vaping Use: Never used  Substance Use Topics  . Alcohol use: Yes    Alcohol/week: 0.0 standard drinks    Comment: [place] occasionally  . Drug use: No    Review of Systems  Constitutional: Negative for chills and fever.  Respiratory: Positive for cough and shortness of breath (chronic, some worsening).   Cardiovascular: Positive for palpitations (chronic). Negative for chest pain.  Gastrointestinal: Negative for nausea and vomiting.  Musculoskeletal: Negative for arthralgias.      Objective:    BP 130/88   Pulse 80   Temp 98 F (36.7 C) (Oral)   Ht 5\' 6"  (1.676 m)   Wt 166 lb 4.8 oz (75.4 kg)   SpO2 94%   BMI 26.84 kg/m  BP Readings from Last 3 Encounters:  06/12/20 130/88  04/20/20 (!) 142/80  04/16/20 (!) 160/100   Wt Readings from Last 3 Encounters:  06/12/20  166 lb 4.8 oz (75.4 kg)  04/20/20 161 lb 3.2 oz (73.1 kg)  04/16/20 161 lb (73 kg)    Physical Exam Vitals reviewed.  Constitutional:      Appearance: She is well-developed and well-nourished.  Eyes:     Conjunctiva/sclera: Conjunctivae normal.  Cardiovascular:     Rate and Rhythm: Normal rate and regular rhythm.     Pulses: Normal pulses.     Heart sounds: Normal heart sounds.  Pulmonary:     Effort: Pulmonary effort is normal.     Breath sounds: Normal breath sounds. No wheezing, rhonchi or rales.  Musculoskeletal:     Comments: Left Hip: No limp noted as patient. Full ROM with flexion and hip rotation in flexion.    No pain of lateral hip with  (flexion-abduction-external rotation) test.   No pain with deep palpation of greater trochanter.   Negative single leg raise.    Skin:    General: Skin is warm and dry.  Neurological:     Mental Status: She is alert.  Psychiatric:        Mood and Affect: Mood and affect normal.        Speech: Speech normal.        Behavior: Behavior normal.        Thought Content: Thought content normal.        Assessment & Plan:   Problem List Items Addressed This Visit      Cardiovascular and Mediastinum   Essential hypertension    Stable. Continue losartan 50mg , diltiazem 240mg .          Respiratory   Stage 2 moderate COPD by GOLD classification (HCC)    SOB on exertion has worsened. NO acute respiratory distress today. Discussed progressive nature of COPD, continued smoking. Advised to discuss medications changes with Wyn Quaker at upcoming  follow up         Other   Hypercholesteremia    Due to update lipid panel. Patient declines today. Continue lipitor 80mg  and we will discuss at follow up      Leg pain, lateral, left    No radicular symptoms. Differentials including trochanteric bursitis, osteoarthritis pelvis, SI joint. Patient declines xray at this time and would like to work on loosing weight which we agreed likely  contributory. Advised baseline TSH, she declines at this time. Close follow up          I am having Jenita  James Ivanoff maintain her Centrum Adults, cholecalciferol, apixaban, diltiazem, atorvastatin, Stiolto Respimat, and losartan.   No orders of the defined types were placed in this encounter.   Return precautions given.   Risks, benefits, and alternatives of the medications and treatment plan prescribed today were discussed, and patient expressed understanding.   Education regarding symptom management and diagnosis given to patient on AVS.  Continue to follow with Burnard Hawthorne, FNP for routine health maintenance.   Christana M Farias and I agreed with plan.   [name], FNP

## 2020-06-17 NOTE — Progress Notes (Signed)
@Patient  ID: Tracy Bell, female    DOB: 1950/08/08, 70 y.o.   MRN: [id number]  Chief Complaint  Patient presents with  . Follow-up    C/o trouble taking a deep breath and dry cough at times prod with clear to yellowish sputum.     Referring provider: Burnard Hawthorne, FNP  HPI:  70 year old female current everyday smoker followed in our office for COPD  PMH: Dysphagia, depression, hyperlipidemia, carotid stenosis, SVT, palpitations Smoker/ Smoking History: Current everyday smoker.  Smoking 1-1.5 packs/day.  120-pack-year smoking history Maintenance: Stiolto Respimat Pt of: Dr. Patsey Berthold   06/18/2020  - Visit   70 year old female current everyday smoker followed in our office for COPD. She is followed by Dr. Patsey Berthold.  Last seen on 01/13/2020. Was due to f/u in 14mths. She was encouraged to stop smoking at that visit. She did not start Chantix starter pack. States she tried Wellbutrin in past without success. Continues to smoke 1-1.5 PPD. Will address today. Continues to use Stiolto Respimat inhaler. Patient  encouraged to receive Covid-19 booster. No recent ABX or steroids.   CT Chest on 04/2020- Development of new left upper and lower lobe pulmonary nodules. Lungs- RADS 4A, suspicious. Has repeat 97mth CT scheduled for 06/2020, patient is aware.    Questionaires / Pulmonary Flowsheets:   ACT:  No flowsheet data found.  MMRC: No flowsheet data found.  Epworth:  No flowsheet data found.  Tests:   FENO:  No results found for: NITRICOXIDE  PFT: No flowsheet data found.  WALK:  No flowsheet data found.  Imaging: No results found.  Lab Results:  CBC    Component Value Date/Time   WBC 8.1 01/17/2020 1035   RBC 4.86 01/17/2020 1035   HGB 15.2 (H) 01/17/2020 1035   HGB 16.2 (H) 12/23/2019 1038   HCT 44.7 01/17/2020 1035   HCT 47.1 (H) 12/23/2019 1038   PLT 210.0 01/17/2020 1035   PLT 219 12/23/2019 1038   MCV 91.9 01/17/2020 1035   MCV 89 12/23/2019  1038   MCH 30.5 12/23/2019 1038   MCH 30.7 07/13/2018 0425   MCHC 34.1 01/17/2020 1035   RDW 13.8 01/17/2020 1035   RDW 12.4 12/23/2019 1038   LYMPHSABS 2.1 01/17/2020 1035   LYMPHSABS 2.6 12/23/2019 1038   MONOABS 0.6 01/17/2020 1035   EOSABS 0.2 01/17/2020 1035   EOSABS 0.2 12/23/2019 1038   BASOSABS 0.1 01/17/2020 1035   BASOSABS 0.1 12/23/2019 1038    BMET    Component Value Date/Time   NA 140 06/01/2020 0958   K 4.1 06/01/2020 0958   CL 104 06/01/2020 0958   CO2 30 06/01/2020 0958   GLUCOSE 106 (H) 06/01/2020 0958   BUN 21 06/01/2020 0958   CREATININE 0.84 06/01/2020 0958   CALCIUM 9.0 06/01/2020 0958   GFRNONAA >60 07/13/2018 0425   GFRAA >60 07/13/2018 0425    BNP No results found for: BNP  ProBNP No results found for: PROBNP  Specialty Problems      Pulmonary Problems   Nodule of left lung    Seen CT Chest 5/12. Recommended following in 6 months.       Stage 2 moderate COPD by GOLD classification (HCC)   SOB (shortness of breath) on exertion      No Known Allergies  Immunization History  Administered Date(s) Administered  . Fluad Quad(high Dose 65+) 01/17/2020  . PFIZER(Purple Top)SARS-COV-2 Vaccination 08/01/2019, 08/28/2019  . Pneumococcal Polysaccharide-23 07/13/2018  . Tdap 12/02/2014  Past Medical History:  Diagnosis Date  . Anxiety   . COPD (chronic obstructive pulmonary disease) ([place])   . Cough   . Depression   . Dizziness   . Dyspnea   . Heart murmur   . Hyperlipidemia   . Hypertension   . Lightheadedness   . Personal history of tobacco use, presenting hazards to health 08/28/2015    Tobacco History: Social History   Tobacco Use  Smoking Status Current Every Day Smoker  . Packs/day: 2.00  . Years: 60.00  . Pack years: 120.00  . Start date: 05/03/1963  Smokeless Tobacco Never Used  Tobacco Comment   1.5PPD 06/18/2020   Ready to quit: No Counseling given: Yes Comment: 1.5PPD 06/18/2020  Smoking assessment and  cessation counseling  Patient currently smoking: 1.5 pack per day  I have advised the patient to quit/stop smoking as soon as possible due to high risk for multiple medical problems.  It will also be very difficult for [place] to manage patient's  respiratory symptoms and status if we continue to expose her lungs to a known irritant.  We do not advise e-cigarettes as a form of stopping smoking.  Patient is or is not willing to quit smoking. Willing to consider setting quit date.   I have advised the patient that we can assist and have options of nicotine replacement therapy, provided smoking cessation education today, provided smoking cessation counseling, and provided cessation resources.  Follow-up next office visit office visit for assessment of smoking cessation.    Smoking cessation counseling advised for: 5 min     Outpatient Encounter Medications as of 06/18/2020  Medication Sig  . apixaban (ELIQUIS) 5 MG TABS tablet Take 1 tablet (5 mg total) by mouth 2 (two) times daily.  . cholecalciferol (VITAMIN D3) 25 MCG (1000 UT) tablet Take 1,000 Units by mouth daily.  Marland Kitchen losartan (COZAAR) 50 MG tablet Take 1 tablet (50 mg total) by mouth daily.  . Multiple Vitamins-Minerals (CENTRUM ADULTS) TABS Take 1 tablet by mouth daily.  . nicotine (NICODERM CQ) 21 mg/24hr patch Place 1 patch (21 mg total) onto the skin daily.  . nicotine polacrilex (COMMIT) 4 MG lozenge Take 1 lozenge (4 mg total) by mouth as needed for smoking cessation.  Marland Kitchen STIOLTO RESPIMAT 2.5-2.5 MCG/ACT AERS INHALE 2 PUFFS BY MOUTH INTO THE LUNGS DAILY  . Tiotropium Bromide-Olodaterol (STIOLTO RESPIMAT) 2.5-2.5 MCG/ACT AERS Inhale 2 puffs into the lungs daily.  Marland Kitchen atorvastatin (LIPITOR) 80 MG tablet Take 1 tablet (80 mg total) by mouth daily.  Marland Kitchen diltiazem (CARDIZEM CD) 240 MG 24 hr capsule Take 1 capsule (240 mg total) by mouth daily.   No facility-administered encounter medications on file as of 06/18/2020.     Review of  Systems  Review of Systems  Constitutional: Negative.   HENT: Negative.   Eyes: Negative.   Respiratory: Positive for cough (recent sl. increase in phelgm. Clear w/ vague yellowish tinge. ) and shortness of breath (with extrenuous activity).   Cardiovascular: Negative.   Endocrine: Negative.   Neurological: Negative.   Psychiatric/Behavioral:       Sleeps approx 6hrs. No set bedtime, can be 6am in the morning.       Physical Exam  BP 126/74 (BP Location: Left Arm, Cuff Size: Normal)   Pulse 61   Temp (!) 97.1 F (36.2 C) (Temporal)   Ht 5\' 6"  (1.676 m)   Wt 166 lb (75.3 kg)   SpO2 97%   BMI 26.79 kg/m  Wt Readings from Last 5 Encounters:  06/18/20 166 lb (75.3 kg)  06/12/20 166 lb 4.8 oz (75.4 kg)  04/20/20 161 lb 3.2 oz (73.1 kg)  04/16/20 161 lb (73 kg)  04/03/20 159 lb (72.1 kg)    BMI Readings from Last 5 Encounters:  06/18/20 26.79 kg/m  06/12/20 26.84 kg/m  04/20/20 26.02 kg/m  04/16/20 25.99 kg/m  04/03/20 26.46 kg/m     Physical Exam Constitutional:      Appearance: Normal appearance.  HENT:     Head: Normocephalic.     Right Ear: External ear normal.     Left Ear: External ear normal.     Nose: Nose normal.     Mouth/Throat:     Mouth: Mucous membranes are moist.  Eyes:     Pupils: Pupils are equal, round, and reactive to light.  Cardiovascular:     Rate and Rhythm: Normal rate.     Pulses: Normal pulses.     Heart sounds: Murmur heard.      Comments:   Pulmonary:     Effort: Pulmonary effort is normal.     Breath sounds: Examination of the right-middle field reveals decreased breath sounds. Examination of the left-middle field reveals decreased breath sounds. Examination of the right-lower field reveals decreased breath sounds and rales. Examination of the left-lower field reveals decreased breath sounds and rales. Decreased breath sounds and rales (left> right) present.  Abdominal:     Palpations: Abdomen is soft.  Musculoskeletal:      Cervical back: Normal range of motion.     Left lower leg: Edema (trace LE) present.  Skin:    General: Skin is warm and dry.  Neurological:     General: No focal deficit present.     Mental Status: She is alert and oriented to person, place, and time.  Psychiatric:        Mood and Affect: Mood normal.        Behavior: Behavior normal.        Thought Content: Thought content normal.       Assessment & Plan:   Stage 2 moderate COPD by GOLD classification ([place]) Continues to smoke. Smokes 1-1.5 PPD. Currently using Stiolto Respimat.    Plan: Smoking cessation information discussed and handout given Nicoderm Patches with lozenges ordered Continue to use Stiolto Respimat as directed Obtain ordered CT 06/2020       Tobacco use disorder Smokes 1-1.5 PPD. Enrolled in [facility] program. Changes noted on last CT, due to repeat 06/2020.    Plan: Smoking cessation discussed and handout given Nicoderm patches and Lozenges ordered  Healthcare maintenance Encouraged to receive Covid-19 booster. Received Pneumococcal vaccine 07/2018. Has received influenza this season.   Plan: Strongly encouraged to quit smoking Continue enrollment in LDCT program Next CT 06/2020     Return in about 2 months (around 08/16/2020), or if symptoms worsen or fail to improve, for Follow up with Wyn Quaker FNP-C, [facility] - Dr. Patsey Berthold.   Lauraine Rinne, NP 06/18/2020   This appointment required 34 minutes of patient care (this includes precharting, chart review, review of results, face-to-face care, etc.).

## 2020-06-18 ENCOUNTER — Ambulatory Visit: Payer: Medicare Other | Admitting: Pulmonary Disease

## 2020-06-18 ENCOUNTER — Other Ambulatory Visit: Payer: Self-pay

## 2020-06-18 ENCOUNTER — Encounter: Payer: Self-pay | Admitting: Pulmonary Disease

## 2020-06-18 VITALS — BP 126/74 | HR 61 | Temp 97.1°F | Ht 66.0 in | Wt 166.0 lb

## 2020-06-18 DIAGNOSIS — F17200 Nicotine dependence, unspecified, uncomplicated: Secondary | ICD-10-CM | POA: Diagnosis not present

## 2020-06-18 DIAGNOSIS — J449 Chronic obstructive pulmonary disease, unspecified: Secondary | ICD-10-CM

## 2020-06-18 DIAGNOSIS — R918 Other nonspecific abnormal finding of lung field: Secondary | ICD-10-CM | POA: Diagnosis not present

## 2020-06-18 DIAGNOSIS — Z0000 Encounter for general adult medical examination without abnormal findings: Secondary | ICD-10-CM

## 2020-06-18 MED ORDER — STIOLTO RESPIMAT 2.5-2.5 MCG/ACT IN AERS
2.0000 | INHALATION_SPRAY | Freq: Every day | RESPIRATORY_TRACT | 0 refills | Status: DC
Start: 2020-06-18 — End: 2021-12-21

## 2020-06-18 MED ORDER — NICOTINE 21 MG/24HR TD PT24: 21.0000 mg | MEDICATED_PATCH | Freq: Every day | TRANSDERMAL | 3 refills | Status: DC

## 2020-06-18 MED ORDER — NICOTINE POLACRILEX 4 MG MT LOZG: 4.0000 mg | LOZENGE | OROMUCOSAL | 3 refills | Status: DC | PRN

## 2020-06-18 NOTE — Assessment & Plan Note (Signed)
Encouraged to receive Covid-19 booster. Received Pneumococcal vaccine 07/2018. Has received influenza this season.   Plan: Strongly encouraged to quit smoking Continue enrollment in LDCT program Next CT 06/2020

## 2020-06-18 NOTE — Assessment & Plan Note (Signed)
Smokes 1-1.5 PPD. Enrolled in [facility]CT program. Changes noted on last CT, due to repeat 06/2020.    Plan: Smoking cessation discussed and handout given Nicoderm patches and Lozenges ordered

## 2020-06-18 NOTE — Patient Instructions (Addendum)
You were seen today by Lauraine Rinne, NP  for:   1. Stage 2 moderate COPD by GOLD classification (HCC)  Stiolto Respimat inhaler >>>2 puffs daily >>>Take this no matter what >>>This is not a rescue inhaler  Note your daily symptoms > remember "red flags" for COPD:   >>>Increase in cough >>>increase in sputum production >>>increase in shortness of breath or activity  intolerance.   If you notice these symptoms, please call the office to be seen.   2. Tobacco use disorder  - nicotine (NICODERM CQ) 21 mg/24hr patch; Place 1 patch (21 mg total) onto the skin daily.  Dispense: 28 patch; Refill: 3 - nicotine polacrilex (COMMIT) 4 MG lozenge; Take 1 lozenge (4 mg total) by mouth as needed for smoking cessation.  Dispense: 100 tablet; Refill: 3  We recommend that you stop smoking.  >>>You need to set a quit date >>>If you have friends or family who smoke, let them know you are trying to quit and not to smoke around you or in your living environment  Smoking Cessation Resources:  1 800 QUIT NOW  >>> Patient to call this resource and utilize it to help support her quit smoking >>> Keep up your hard work with stopping smoking  You can also contact the [facility] >>>For smoking cessation classes call [phone]  We do not recommend using e-cigarettes as a form of stopping smoking  You can sign up for smoking cessation support texts and information:  >>>https://smokefree.gov/smokefreetxt  Nicotine patches: >>>Make sure you rotate sites that you do not get skin irritation, Apply 1 patch each morning to a non-hairy skin site  If you are smoking greater than 10 cigarettes/day and weigh over 45 kg start with the nicotine patch of 21 mg a day for 6 weeks, then 14 mg a day for 2 weeks, then finished with 7 mg a day for 2 weeks, then stop  If you are smoking less than 10 cigarettes a day or weight less than 45 kg start with medium dose pack of 14 mg a day for 6 weeks, followed  by 7 mg a day for 2 weeks   >>>If insomnia occurs you are having trouble sleeping you can take the patch off at night, and place a new one on in the morning >>>If the patch is removed at night and you have morning cravings start short acting nicotine replacement therapy such as gum or lozenges    >>>Avoid acidic beverages such as coffee, carbonated beverages before and during gum use.  A soft acidic beverages lower oral pH which cause nicotine to not be absorbed properly >>>If you chew the gum too quickly or vigorously you could have nausea, vomiting, abdominal pain, constipation, hiccups, headache, sore jaw, mouth irritation ulcers  Nicotine lozenge: Lozenges are commonly uses short acting NRT product  >>>Smokers who smoke within 30 minutes of awakening should use 4 mg dose >>>Smokers who wait more than 30 minutes after awakening to smoke should use 2 mg dose  Can use up to 1 lozenge every 1-2 hours for 6 weeks >>>Total amount of lozenges that can be used per day as 20 >>>Gradually reduce number of lozenges used per day after 2 weeks of use  Place lozenge in mouth and allowed to dissolve for 30 minutes loss and does not need to be chewed  Lozenges have advantages to be able to be used in people with TMG, poor dentition, dentures   3. Abnormal findings on diagnostic imaging  of lung  Continue your lung cancer screening CT in March/2022  4. Healthcare maintenance  Work on stopping smoking  Work on increasing your overall physical activity   We recommend today:   Meds ordered this encounter  Medications  . Tiotropium Bromide-Olodaterol (STIOLTO RESPIMAT) 2.5-2.5 MCG/ACT AERS    Sig: Inhale 2 puffs into the lungs daily.    Dispense:  4 g    Refill:  0    Order Specific Question:   Lot Number?    Answer:   [id number]    Order Specific Question:   Expiration Date?    Answer:   11/30/2021    Order Specific Question:   Quantity    Answer:   4  . nicotine (NICODERM CQ) 21 mg/24hr  patch    Sig: Place 1 patch (21 mg total) onto the skin daily.    Dispense:  28 patch    Refill:  3  . nicotine polacrilex (COMMIT) 4 MG lozenge    Sig: Take 1 lozenge (4 mg total) by mouth as needed for smoking cessation.    Dispense:  100 tablet    Refill:  3    Follow Up:    No follow-ups on file.   Notification of test results are managed in the following manner: If there are  any recommendations or changes to the  plan of care discussed in office today,  we will contact you and let you know what they are. If you do not hear from [place], then your results are normal and you can view them through your  MyChart account , or a letter will be sent to you. Thank you again for trusting [place] with your care  - Thank you, [facility]    It is flu season:   >>> Best ways to protect herself from the flu: Receive the yearly flu vaccine, practice good hand hygiene washing with soap and also using hand sanitizer when available, eat a nutritious meals, get adequate rest, hydrate appropriately       Please contact the office if your symptoms worsen or you have concerns that you are not improving.   Thank you for choosing [facility] for your healthcare, and for allowing [place] to partner with you on your healthcare journey. I am thankful to be able to provide care to you today.   Wyn Quaker FNP-C     Steps to Quit Smoking Smoking tobacco is the leading cause of preventable death. It can affect almost every organ in the body. Smoking puts you and people around you at risk for many serious, long-lasting (chronic) diseases. Quitting smoking can be hard, but it is one of the best things that you can do for your health. It is never too late to quit. How do I get ready to quit? When you decide to quit smoking, make a plan to help you succeed. Before you quit:  Pick a date to quit. Set a date within the next 2 weeks to give you time to prepare.  Write down the reasons why you are  quitting. Keep this list in places where you will see it often.  Tell your family, friends, and co-workers that you are quitting. Their support is important.  Talk with your doctor about the choices that may help you quit.  Find out if your health insurance will pay for these treatments.  Know the people, places, things, and activities that make you want to smoke (triggers). Avoid them. What first steps  can I take to quit smoking?  Throw away all cigarettes at home, at work, and in your car.  Throw away the things that you use when you smoke, such as ashtrays and lighters.  Clean your car. Make sure to empty the ashtray.  Clean your home, including curtains and carpets. What can I do to help me quit smoking? Talk with your doctor about taking medicines and seeing a counselor at the same time. You are more likely to succeed when you do both.  If you are pregnant or breastfeeding, talk with your doctor about counseling or other ways to quit smoking. Do not take medicine to help you quit smoking unless your doctor tells you to do so. To quit smoking: Quit right away  Quit smoking totally, instead of slowly cutting back on how much you smoke over a period of time.  Go to counseling. You are more likely to quit if you go to counseling sessions regularly. Take medicine You may take medicines to help you quit. Some medicines need a prescription, and some you can buy over-the-counter. Some medicines may contain a drug called nicotine to replace the nicotine in cigarettes. Medicines may:  Help you to stop having the desire to smoke (cravings).  Help to stop the problems that come when you stop smoking (withdrawal symptoms). Your doctor may ask you to use:  Nicotine patches, gum, or lozenges.  Nicotine inhalers or sprays.  Non-nicotine medicine that is taken by mouth. Find resources Find resources and other ways to help you quit smoking and remain smoke-free after you quit. These  resources are most helpful when you use them often. They include:  Online chats with a Social worker.  Phone quitlines.  Printed Furniture conservator/restorer.  Support groups or group counseling.  Text messaging programs.  Mobile phone apps. Use apps on your mobile phone or tablet that can help you stick to your quit plan. There are many free apps for mobile phones and tablets as well as websites. Examples include Quit Guide from the [employer] and smokefree.gov   What things can I do to make it easier to quit?  Talk to your family and friends. Ask them to support and encourage you.  Call a phone quitline (1-800-QUIT-NOW), reach out to support groups, or work with a Social worker.  Ask people who smoke to not smoke around you.  Avoid places that make you want to smoke, such as: ? Bars. ? Parties. ? Smoke-break areas at work.  Spend time with people who do not smoke.  Lower the stress in your life. Stress can make you want to smoke. Try these things to help your stress: ? Getting regular exercise. ? Doing deep-breathing exercises. ? Doing yoga. ? Meditating. ? Doing a body scan. To do this, close your eyes, focus on one area of your body at a time from head to toe. Notice which parts of your body are tense. Try to relax the muscles in those areas.   How will I feel when I quit smoking? Day 1 to 3 weeks Within the first 24 hours, you may start to have some problems that come from quitting tobacco. These problems are very bad 2-3 days after you quit, but they do not often last for more than 2-3 weeks. You may get these symptoms:  Mood swings.  Feeling restless, nervous, angry, or annoyed.  Trouble concentrating.  Dizziness.  Strong desire for high-sugar foods and nicotine.  Weight gain.  Trouble pooping (constipation).  Feeling like you  may vomit (nausea).  Coughing or a sore throat.  Changes in how the medicines that you take for other issues work in your body.  Depression.  Trouble  sleeping (insomnia). Week 3 and afterward After the first 2-3 weeks of quitting, you may start to notice more positive results, such as:  Better sense of smell and taste.  Less coughing and sore throat.  Slower heart rate.  Lower blood pressure.  Clearer skin.  Better breathing.  Fewer sick days. Quitting smoking can be hard. Do not give up if you fail the first time. Some people need to try a few times before they succeed. Do your best to stick to your quit plan, and talk with your doctor if you have any questions or concerns. Summary  Smoking tobacco is the leading cause of preventable death. Quitting smoking can be hard, but it is one of the best things that you can do for your health.  When you decide to quit smoking, make a plan to help you succeed.  Quit smoking right away, not slowly over a period of time.  When you start quitting, seek help from your doctor, family, or friends. This information is not intended to replace advice given to you by your health care provider. Make sure you discuss any questions you have with your health care provider. Document Revised: 01/11/2019 Document Reviewed: 07/07/2018 Elsevier Patient Education  2021 [place] [employer] [place], [phone]), e5-e31. http://knight.com/.443154-0086PY">  Health Risks of Smoking Smoking tobacco is very bad for your health. Tobacco smoke contains many toxic chemicals that can damage every part of your body. Secondhand smoke can be harmful to those around you. Tobacco or nicotine use can cause many long-term (chronic) diseases. Smoking is difficult to quit because a chemical in tobacco, called nicotine, causes addiction or dependence. When you smoke and inhale, nicotine is absorbed quickly into the bloodstream through your lungs. Both inhaled and non-inhaled nicotine may be addictive. How can quitting affect me? There are health benefits of quitting  smoking. Some benefits happen right away and others take time. Benefits may include:  Blood flow, blood pressure, heart rate, and lung capacity may begin to improve. However, any lung damage that has already occurred cannot be repaired.  Temporary respiratory symptoms, such as nasal congestion and cough, may improve over time.  Your risk of heart disease, stroke, and cancer is reduced.  The overall quality of your health may improve.  You may save money, as you will not spend money on tobacco products and may spend less money on smoking-related health issues. What can increase my risk? Smoking harms nearly every organ in the body. People who smoke tobacco have a shorter life expectancy and an increased risk of many serious medical problems. These include:  More respiratory infections, such as colds and pneumonia.  Cancer.  Heart disease.  Stroke.  Chronic respiratory diseases.  Delayed wound healing and increased risk of complications during surgery.  Problems with reproduction, pregnancy, and childbirth, such as infertility, early (premature) births, stillbirths, and birth defects. Secondhand smoke exposure to children increases the risk of:  Sudden infant death syndrome (SIDS).  Infections in the nose, throat, or airways (respiratory infections).  Chronic respiratory symptoms.   What actions can I take to quit? Smoking is an addiction that affects both your body and your mind, and long-time habits can be hard to change. Your health care provider can recommend:  Nicotine replacement products, such as  patches, gum, and nasal sprays. Use these products only as directed. Do not replace cigarette smoking with electronic cigarettes, which are commonly called e-cigarettes. The safety of e-cigarettes is not known, and some may contain harmful chemicals.  Programs and community resources, which may include group support, education, or talk therapy.  Prescription medicines to help  reduce cravings.  A combination of two or more quit methods, which will increase the success of quitting.   Where to find support Follow the recommendations from your health care provider about support groups and other assistance. You can also visit:  [employer]: www.naquitline.org or call 1-800-QUIT-NOW.  U.S. Department of Health and Human Services: www.smokefree.gov  American Lung Association: www.freedomfromsmoking.org  American Heart Association: www.heart.org Where to find more information  Centers for Disease Control and Prevention: http://www.wolf.info/  World Health Organization: RoleLink.com.br Summary  Smoking tobacco is very bad for your health. Tobacco smoke contains many toxic chemicals that can damage every part of the body.  Smoking is difficult to quit because a chemical in tobacco, called nicotine, causes addiction or dependence.  There are immediate and long-term health benefits of quitting smoking.  A combination of two or more quit methods increases the success of quitting. This information is not intended to replace advice given to you by your health care provider. Make sure you discuss any questions you have with your health care provider. Document Revised: 06/03/2019 Document Reviewed: 06/03/2019 Elsevier Patient Education  2021 [employer].

## 2020-06-18 NOTE — Assessment & Plan Note (Signed)
Continues to smoke. Smokes 1-1.5 PPD. Currently using Stiolto Respimat.    Plan: Smoking cessation information discussed and handout given Nicoderm Patches with lozenges ordered Continue to use Stiolto Respimat as directed Obtain ordered CT 06/2020

## 2020-06-19 NOTE — Progress Notes (Signed)
Agree with the details of the visit as noted by Brian Mack, NP.  C. Laura Myca Perno, MD [facility] 

## 2020-07-02 ENCOUNTER — Ambulatory Visit: Admission: RE | Admit: 2020-07-02 | Payer: Medicare Other | Source: Ambulatory Visit

## 2020-07-03 ENCOUNTER — Other Ambulatory Visit (HOSPITAL_COMMUNITY): Payer: Self-pay | Admitting: Internal Medicine

## 2020-07-03 ENCOUNTER — Ambulatory Visit: Payer: Medicare Other | Attending: Internal Medicine

## 2020-07-03 DIAGNOSIS — Z23 Encounter for immunization: Secondary | ICD-10-CM

## 2020-07-03 NOTE — Progress Notes (Signed)
   Covid-19 Vaccination Clinic  Name:  EVANELL REDLICH    MRN: [id number] DOB: 26-Oct-1950  07/03/2020  Ms. Gaumond was observed post Covid-19 immunization for 15 minutes without incident. She was provided with Vaccine Information Sheet and instruction to access the V-Safe system.   Ms. Romberger was instructed to call 911 with any severe reactions post vaccine: Marland Kitchen Difficulty breathing  . Swelling of face and throat  . A fast heartbeat  . A bad rash all over body  . Dizziness and weakness   Immunizations Administered    Name Date Dose VIS Date Route   PFIZER Comrnaty(Gray TOP) Covid-19 Vaccine 07/03/2020  9:54 AM 0.3 mL 04/09/2020 Intramuscular   Manufacturer: [place]   Lot: [id number]   NDC: [phone]

## 2020-07-10 ENCOUNTER — Telehealth: Payer: Self-pay | Admitting: Licensed Clinical Social Worker

## 2020-07-10 NOTE — Telephone Encounter (Signed)
Spoke with patient and she accepted appointment for 07/20/2020 for 11:00am. Patient has same insurance and is a current smoker.

## 2020-07-15 ENCOUNTER — Encounter: Payer: Self-pay | Admitting: Physician Assistant

## 2020-07-15 ENCOUNTER — Ambulatory Visit: Payer: Medicare Other | Admitting: Physician Assistant

## 2020-07-15 ENCOUNTER — Other Ambulatory Visit: Payer: Self-pay

## 2020-07-15 ENCOUNTER — Other Ambulatory Visit: Payer: Self-pay | Admitting: *Deleted

## 2020-07-15 VITALS — BP 120/78 | HR 59 | Ht 66.0 in | Wt 165.0 lb

## 2020-07-15 DIAGNOSIS — E785 Hyperlipidemia, unspecified: Secondary | ICD-10-CM | POA: Diagnosis not present

## 2020-07-15 DIAGNOSIS — Z8679 Personal history of other diseases of the circulatory system: Secondary | ICD-10-CM | POA: Diagnosis not present

## 2020-07-15 DIAGNOSIS — I10 Essential (primary) hypertension: Secondary | ICD-10-CM

## 2020-07-15 DIAGNOSIS — I4892 Unspecified atrial flutter: Secondary | ICD-10-CM | POA: Diagnosis not present

## 2020-07-15 DIAGNOSIS — J449 Chronic obstructive pulmonary disease, unspecified: Secondary | ICD-10-CM | POA: Diagnosis not present

## 2020-07-15 DIAGNOSIS — Z720 Tobacco use: Secondary | ICD-10-CM | POA: Diagnosis not present

## 2020-07-15 DIAGNOSIS — I712 Thoracic aortic aneurysm, without rupture: Secondary | ICD-10-CM | POA: Diagnosis not present

## 2020-07-15 DIAGNOSIS — Z7901 Long term (current) use of anticoagulants: Secondary | ICD-10-CM

## 2020-07-15 DIAGNOSIS — I2510 Atherosclerotic heart disease of native coronary artery without angina pectoris: Secondary | ICD-10-CM

## 2020-07-15 DIAGNOSIS — Z9889 Other specified postprocedural states: Secondary | ICD-10-CM

## 2020-07-15 DIAGNOSIS — I471 Supraventricular tachycardia, unspecified: Secondary | ICD-10-CM

## 2020-07-15 DIAGNOSIS — I7121 Aneurysm of the ascending aorta, without rupture: Secondary | ICD-10-CM

## 2020-07-15 DIAGNOSIS — I6523 Occlusion and stenosis of bilateral carotid arteries: Secondary | ICD-10-CM

## 2020-07-15 DIAGNOSIS — I700 Atherosclerosis of aorta: Secondary | ICD-10-CM

## 2020-07-15 MED ORDER — DILTIAZEM HCL ER COATED BEADS 240 MG PO CP24: 240.0000 mg | ORAL_CAPSULE | Freq: Every day | ORAL | 0 refills | Status: DC

## 2020-07-15 NOTE — Patient Instructions (Signed)
Medication Instructions:  Your physician recommends that you continue on your current medications as directed. Please refer to the Current Medication list given to you today.  *If you need a refill on your cardiac medications before your next appointment, please call your pharmacy*   Lab Work: None ordered   Testing/Procedures: None ordered   Follow-Up: At [facility], you and your health needs are our priority.  As part of our continuing mission to provide you with exceptional heart care, we have created designated Provider Care Teams.  These Care Teams include your primary Cardiologist (physician) and Advanced Practice Providers (APPs -  Physician Assistants and Nurse Practitioners) who all work together to provide you with the care you need, when you need it.  We recommend signing up for the patient portal called "MyChart".  Sign up information is provided on this After Visit Summary.  MyChart is used to connect with patients for Virtual Visits (Telemedicine).  Patients are able to view lab/test results, encounter notes, upcoming appointments, etc.  Non-urgent messages can be sent to your provider as well.   To learn more about what you can do with MyChart, go to [email or web address].    Your next appointment:   3 - 6 month(s)  The format for your next appointment:   In Person  Provider:   You may see Dr. Fletcher Anon or one of the following Advanced Practice Providers on your designated Care Team:    Murray Hodgkins, NP  Christell Faith, PA-C  [name], PA-C  Cad[place], [place]  Laurann [place], NP

## 2020-07-15 NOTE — Progress Notes (Signed)
Office Visit    Patient Name: Tracy Bell Date of Encounter: 07/15/2020  Primary Care Provider:  Burnard Hawthorne, FNP Primary Cardiologist:  Dr. Fletcher Anon  Chief Complaint    Chief Complaint  Patient presents with  . Follow-up    3 months--overdue   70 year old female with history of SVT, new atrial flutter (8/23) with variable block, Mobitz I (Wenckebach), COPD, hyperlipidemia, aortic stenosis, AAA (3.2cm, upper limits of normal with recommendation for recheck 09/2020), hypertension, HLD, carotid disease s/p right carotid endarterectomy (07/2018), tobacco use (1-2 packs daily), and seen today for 3 month follow-up.  Past Medical History    Past Medical History:  Diagnosis Date  . Anxiety   . COPD (chronic obstructive pulmonary disease) ([place])   . Cough   . Depression   . Dizziness   . Dyspnea   . Heart murmur   . Hyperlipidemia   . Hypertension   . Lightheadedness   . Personal history of tobacco use, presenting hazards to health 08/28/2015   Past Surgical History:  Procedure Laterality Date  . ABDOMINAL HYSTERECTOMY  1980   complete  . BREAST BIOPSY  1970   normal  . DILATION AND CURETTAGE OF UTERUS    . ENDARTERECTOMY Right 07/12/2018   Procedure: ENDARTERECTOMY CAROTID;  Surgeon: Algernon Huxley, MD;  Location: [facility] ORS;  Service: Vascular;  Laterality: Right;  . TUBAL LIGATION      Allergies  No Known Allergies  History of Present Illness    Tracy Bell is a 70 y.o. female with PMH as above.  She was seen in the past by Dr. Saralyn Pilar. It has been noted her previous   --CT scan showed evidence of CAC.  --05/2019 Zio was performed and showed 100 SVT runs, with the fastest lasting 1 min 26 seconds with max rate of 197bpm and the longest 50 min and 54 seconds with average rate of 144 bm. The MD notification criteria for SVT was met. Also noted was second degree AVB - Mobitz I (Wenckebach) with sleep study recommended at RTC but politely declined per patient.  She was started on diltiazem.  --Echo 06/2019 as below showed EF 60-65%, G2DD, mild TR, mild to moderate AS with mean gradient 48mHg and valve area 1.4cm2. Mildly elevated PASP was also noted with RVSP 32.840mg.  She has history of elevated BP with antihypertensive tx increased by both PCP and cardiology.. Vascular studies as below with RAS seen on images and recommendation for follow-up. Salt, tobacco, EtOH, and caffeine discussed. She has not quit smoking and does not drink EtOH. She is working on cutting back caffeine.  She has been seen by seen vascular surgery with recommendation to continue to monitor her vascular dz, including AAA dilation, renal dilation/stenosis, and vascular plaque. Most recent 10/2019 scan showed infrarenal aorta at 3.2cm, which is at the upper limits of normal, and with recommendation for duplex recheck in 1 year or ~ 09/2020. CT also showed L renal artery had heavily calcified atherosclerotic plaque of the proximal left renal artery and at least moderate focal stenosis, as well as heavily calcified atherosclerotic plaque of the iliac arteries bilaterally with multifocal regions of mild to moderate stenosis.  She is followed for her carotids with bilateral stenosis.  She has h/o  fatigue and Mobitz I with sleep study recommended but deferred.   She has tachypalpitations and diagnosed with new atrial flutter (12/23/19). She was started on anticoagulation with diltiazem dose increased.   EP referral and  sleep study deferred.    She has SOB/DOE and known COPD/emphysema. AS has been discussed as possibly contributing as well with any intervention to valve deferred. She was prescribed Chantix but declined to start this medication. Pulmonary referral recommended for COPD/ emphysema.  PFTs 08/2019 recommend OP pulmonology referral. 04/2020 CT scan shows interval development of new left upper lobe and lower lobe pulmonary nodules.  Recommendation was for follow-up chest CT without  contrast in 3 months.  PET scan was noted to also be a possibility for repeat imaging.    She has reported dysphagia and lymphadenopathy with ENT referral recommended and subsequent head CT performed as under studies but without malignancy.  GI referral recommended for dysphagia and nausea but deferred.  When last seen 04/16/2020,  BP 160/100. She had not taken her diltiazem.  She was up 4 pounds up from her previous clinic weight but not felt to be volume overloaded.  Due to increased cost of Eliquis, samples provided. She decliend sleep study and lower extremity arterial studies until out of her donut hole.  She continued to smoke at 1-1/2 packs/day and not yet cut back.  She had some dizziness with standing.    Today, 07/15/2020, she returns to clinic and notes that she is doing about the same as previous visits.  She denies any chest pain, racing heart rate, or palpitations.  She does continue to report right neck pulsations with episodes of atrial fibrillation/flutter but otherwise denies any symptoms associated with her A. fib/flutter.  She reports significant improvement since starting on diltiazem.  She is avoiding all salt and alcohol, which has helped her BP.  She has not yet quit smoking and still declines Chantix; she is not certain if she wants to quit smoking.   BP log reviewed with overall BP well controlled, though recent elevated heart rate with lower BP, which we discussed is likely when she is in atrial flutter.  Sleep study and EP referral again reviewed with patient preference to defer. She also defers updated echo for her known AV dz, though she does note ongoing shortness of breath/dyspnea.  She has been following with pulmonology for her COPD/emphysema, and she notes that she has been given some breathing exercises that seem to be helping her. She reports left-sided hip pain and bilateral lower extremity burning/claudication symptoms with walking, as well as LE coldness.  She has been  following with vascular surgery with recommendation she update lower extremity studies, carotid studies, and CTA as indicated by vascular surgery. No further dizziness. No amaurosis fugax.  She denies signs or symptoms of volume overload and is euvolemic on exam.  She reports medication compliance, though she does admit that she occasionally forgets her second dose of Eliquis.  Recommended she write notes or set phone alarms in order to ensure compliance with anticoagulation, given this will reduce her risk of stroke with patient understanding. No reported signs or symptoms of bleeding.   On review of PCP note, lipid study was deferred at her previous visit. We discussed lipid control is important to risk factor modification, given her known vascular disease.  Recommend follow-up lipid study when next to PCP, given collection via her PCP is cheaper than that then through our office.    Home Medications    Current Outpatient Medications on File Prior to Visit  Medication Sig Dispense Refill  . apixaban (ELIQUIS) 5 MG TABS tablet Take 1 tablet (5 mg total) by mouth 2 (two) times daily. 60 tablet 11  .  cholecalciferol (VITAMIN D3) 25 MCG (1000 UT) tablet Take 1,000 Units by mouth daily.    Marland Kitchen losartan (COZAAR) 50 MG tablet Take 1 tablet (50 mg total) by mouth daily. 90 tablet 1  . Multiple Vitamins-Minerals (CENTRUM ADULTS) TABS Take 1 tablet by mouth daily. 30 tablet 6  . nicotine (NICODERM CQ) 21 mg/24hr patch Place 1 patch (21 mg total) onto the skin daily. 28 patch 3  . nicotine polacrilex (COMMIT) 4 MG lozenge Take 1 lozenge (4 mg total) by mouth as needed for smoking cessation. 100 tablet 3  . Tiotropium Bromide-Olodaterol (STIOLTO RESPIMAT) 2.5-2.5 MCG/ACT AERS Inhale 2 puffs into the lungs daily. 4 g 0  . atorvastatin (LIPITOR) 80 MG tablet Take 1 tablet (80 mg total) by mouth daily. 90 tablet 3   No current facility-administered medications on file prior to visit.     Review of Systems     She denies chest pain, palpitations, dyspnea, pnd, orthopnea, n, v, dizziness, syncope, edema, weight gain, or early satiety.  She reports stable dyspnea and shortness of breath.  Resolution of dizziness.  Ongoing bilateral lower extremity erythema and claudication symptoms noted with patient preference to defer arterial studies.  All other systems reviewed and are otherwise negative except as noted above.  Physical Exam    VS:  BP 120/78   Pulse (!) 59   Ht '5\' 6"'  (1.676 m)   Wt 165 lb (74.8 kg)   BMI 26.63 kg/m  , BMI Body mass index is 26.63 kg/m.   General: Well developed, well nourished, in no acute distress. Head: Normocephalic, atraumatic, sclera non-icteric, no xanthomas, nares are without discharge. Neck: right sided carotid bruit versus radiation from aortic valve, no left bruit. JVP not elevated. Lungs:Bilateral wheezing and coarse breath sounds. Breathing is unlabored. Heart: bradycardic but regular, S1 S2 3/6 systolic murmur RUSB. No rubs, or gallops.  Abdomen: Soft, non-tender, non-distended with normoactive bowel sounds. No rebound/guarding. Extremities: Bilateral erythema and moderate bilateral non-pitting edema. Distal pedal pulses are 1+ and equal bilaterally. Neuro: Alert and oriented X 3. Moves all extremities spontaneously. Psych:  Responds to questions appropriately with a normal affect.  Accessory Clinical Findings    ECG personally reviewed by me today SB, PACs, 59bpm, peaked p waves  VITALS Reviewed today   Temp Readings from Last 3 Encounters:  06/18/20 (!) 97.1 F (36.2 C) (Temporal)  06/12/20 98 F (36.7 C) (Oral)  04/20/20 98.3 F (36.8 C)   BP Readings from Last 3 Encounters:  07/15/20 120/78  06/18/20 126/74  06/12/20 130/88   Pulse Readings from Last 3 Encounters:  07/15/20 (!) 59  06/18/20 61  06/12/20 80    Wt Readings from Last 3 Encounters:  07/15/20 165 lb (74.8 kg)  06/18/20 166 lb (75.3 kg)  06/12/20 166 lb 4.8 oz (75.4 kg)      LABS  reviewed today    Lab Results  Component Value Date   WBC 8.1 01/17/2020   HGB 15.2 (H) 01/17/2020   HCT 44.7 01/17/2020   MCV 91.9 01/17/2020   PLT 210.0 01/17/2020   Lab Results  Component Value Date   CREATININE 0.84 06/01/2020   BUN 21 06/01/2020   NA 140 06/01/2020   K 4.1 06/01/2020   CL 104 06/01/2020   CO2 30 06/01/2020   Lab Results  Component Value Date   ALT 19 05/15/2019   AST 21 05/15/2019   ALKPHOS 74 05/15/2019   BILITOT 0.8 05/15/2019   Lab Results  Component  Value Date   CHOL 167 05/15/2019   HDL 58.10 05/15/2019   LDLCALC 90 05/15/2019   TRIG 93.0 05/15/2019   CHOLHDL 3 05/15/2019    Lab Results  Component Value Date   HGBA1C 5.8 03/23/2018   Lab Results  Component Value Date   TSH 3.05 03/23/2018     STUDIES/PROCEDURES reviewed today   Zio Predominantly NSR, HR 25-197 with average HR 65bpm. 100 SVT runs occurred. The run with the fastest interval lasted 1 minute and 26 seconds with maximum rate of 197 bpm, the longest lasting 50 min 54 seconds with an average rate of 144bpm. Second degree AVB Mobitz (Wenckebach) was present. SVT was detected within +- 45 seconds of symptomatic patient events. Isolated SVE were rare (<1.0%), SVE Couplets were rare (<1.0%), and SVE Triplets were rare (<1.0%). Isolated VE were rare (<1.0%), VE Couplets were rare (<1.0%), and no VE Triplets were present. MD notification for SVT met.   Echo 06/05/19 1. Left ventricular ejection fraction, by visual estimation, is 60 to  65%. The left ventricle has normal function. There is borderline left  ventricular hypertrophy.  2. Left ventricular diastolic parameters are consistent with Grade II  diastolic dysfunction (pseudonormalization).  3. The left ventricle has no regional wall motion abnormalities.  4. Global right ventricle has normal systolic function.The right  ventricular size is normal. Right vetricular wall thickness was not  assessed.  5. Left  atrial size was normal.  6. Right atrial size was normal.  7. The mitral valve is normal in structure. No evidence of mitral valve  regurgitation.  8. The tricuspid valve is normal in structure.  9. The tricuspid valve is normal in structure. Tricuspid valve  regurgitation is mild.  10. The aortic valve is abnormal. Aortic valve regurgitation is not  visualized. Mild to moderate aortic valve stenosis.  11. Peak AV gradient 9mHg, MG 187mg, DVI 0.45, with LVOT diameter of  2cm, AVA 1.4cm2.  12. The pulmonic valve was not well visualized. Pulmonic valve  regurgitation is not visualized.  13. Mildly elevated pulmonary artery systolic pressure.  14. The inferior vena cava is normal in size with greater than 50%  respiratory variability, suggesting right atrial pressure of 3 mmHg.    Assessment & Plan    Paroxysmal Atrial Flutter with variable block and RVR History of SVT --Improvement in tachypalpitations with diltiazem 24041m Continue..  --Continue on Eliquis 5 mg twice daily given CHA2DS2VASc score of at least 5 (CHF, HTN, agex1, vascular, female).  She denies any signs or symptoms of bleeding. .  --Politely decline sleep study, EP referral.   --Politely declines repeat labs to check electrolytes. --No medication changes or further work-up at this time.  Mobitz I (Wenckebach) Possible sleep apnea --Declines a sleep study.She is being seen by pulmonology.  Aortic stenosis, mild to moderate --Stable DOE. Most recent echo as above with mild to moderate AS and abnormal valve. Harsh murmur on exam. Dizziness with standing resolved. She declines repeat echo +/- referral to the structural heart team at this time.   Coronary artery disease involving the native coronary arteries on CT --No recent or current CP and stable DOE. Recent 04/03/20 CT with coronary and aortic atherosclerosis.She does have significant risk factors for coronary artery disease, including current smoker.  Further ischemic workup declined. Continue atorvastatin 34m45m and Eliquis in lieu of ASA.Risk factor modification discussed / lifestyle changes. Repeat labs at PCP. Smoking cessation encouraged.  HLD, goal LDL below 70 --Continue high  intensity statin with Lipitor 44m daily.  LDL goal <70. Continue Eliquis in lieu of ASA. Lifestyle changes discussed, including smoking cessation.    Defer to PCP regarding annual monitoring of lipids. She declined repeat lipids today.   Essential hypertension, poorly controlled (no medications taken today) --BP well controlled in office and at home. Reviewed fluid and salt restrictions. Lifestyle changes discussed.  Continue current medications.  Ascending aortic aneurysm, 4.1cm by most recent non-contrast CT --Previous 05/18/19 CT scan noted 4.3 cm ascending aortic aneurysm.  Recent 04/2020 CT of chest for lung cancer screening measured thoracic aorta at 4.1cm with recommendation for close monitoring during repeat scan within 3 months. Follow-up with vascular surgery as recommended, which we discussed today. Continue Lipitor 869mdaily with Eliquis in place of ASA. Smoking cessation. Avoid heavy lifting and FQ.Follow with [facility] as scheduled.  Carotid artery diseases/p Rcarotid endarterectomy --Followed by [facility]VS.  No reported amaurosis fugax. Smokingcessation, as well as BP/heart rate control.Continue Lipitor 8011mContinue Eliquis in place of ASA. Risk factor modification recommended.  Current smoker(1.5 packs / day) Pulmonary nodules --Ongoing DOE/SOB. CT scan with tiny bilateral pulmonary nodules and development of new 7.4mm84mdule of medial left upper lobe, as well as new irregular left lower lobe nodule measuring 6.3mm.38mng -RADS 4A with recommendation for follow-up low dose chest CT without contrast in 3 months. PET scan was also noted to be considered. Smoking cessation advised.  She was prescribed Chantix; however, she prefers to avoid this  medication on reading about it further.   PVD --Reports claudication sx. Pt preference is to continue to defer scans at this time - recommended she follow with vascular surgery for repeat carotids, LE studies, CTA. Continue risk factor modification with high intensity statin. She will call the office if any concerning or worsening sx.  Medication changes: None.   Labs ordered: None. Lipids per PCP. Studies / Imaging ordered: None.  Future considerations: At RTC, reassess smoking cessation.  Reassess desire for sleep study, lower extremity arterial studies, carotids, repeat echo, repeat imaging of AAA as directed. Encourage repeat imaging with low threshold for TEE for her aortic valve, given her harsh murmur on exam. Disposition: RTC 3 - 6 months.   JacquArvil ChacoC 07/15/2020

## 2020-07-15 NOTE — Progress Notes (Deleted)
Cardiology Office Note:    Date:  07/15/2020   ID:  Brendan, Gruwell 08-Apr-1951, MRN [id number]  PCP:  Burnard Hawthorne, [facility] Cardiologist:  No primary care provider on file.  [facility] Electrophysiologist:  None   Referring MD: Burnard Hawthorne, FNP   Chief Complaint: 3 month follow-up  History of Present Illness:    Tracy Bell is a 70 y.o. female with a hx of SVT, new aflutter 8/23 wit variable block, Mobitz 1, COPD, HLD, aortic stenosis, AAA (3.2cm with recheck for 09/2020), HTN, HLD, carotid disease s/p right carotid endarectomy 07/2018, tobacco use who is here for 3 month folow-up.   She has been seen by Dr. Saralyn Pilar. Previoust CT scat with evidence of CAD. She had a stress echo 12/2015 for which she exercissed for 4 minutes and 6 seconds without symptoms. Wcho showed nl LVSF with mild AS and no ischemia.   She had palpitations in 2019 with 72 hour heart monitor showing SR with frequent PVCs and occasional brief atrial runs, longest 7 beats.   Echo 04/2018 showed nl LVSF, mild to mod AS with mead gradient 38mmHg and valve area 1.16.   Zio showed SVT and Mobitz type 1 and a sleep study was recommended but patient declined.   Echo showed LVEF 60-65%, G2DD, mild TR, mild to mod As with mean gradient 61mmHg and valve area 1.4cm2, mildly elevated PASP was also noted with RVSP 32.71mmHg. Given normal EF and SVT she was transitioned to amlodipine to diltiazem for better control  Vascular recommend Recheck aortia 09/2020  She was seen in clinic an found tohave new aflutter and started on anticoagulation with diltiazem. She saw EP and deferred DCCV.   Last seen 04/16/20 and was doing the same. BP high but had not taken diltiazem.   PAF wuth variable block and RVR Hx of Aflutter CHADSVAC 5 (CHF, HTN, agex1, vascular, female)  Mobitz type 1 Possible sleep apnea Deferred sleep study  Aortic stenosis, mild to mod AS and abnormal valve  CAD per chest  CT  HLD  HTN  Carotid disease s/p R carotid endarectomy  Tobacco use   Past Medical History:  Diagnosis Date  . Anxiety   . COPD (chronic obstructive pulmonary disease) ([place])   . Cough   . Depression   . Dizziness   . Dyspnea   . Heart murmur   . Hyperlipidemia   . Hypertension   . Lightheadedness   . Personal history of tobacco use, presenting hazards to health 08/28/2015    Past Surgical History:  Procedure Laterality Date  . ABDOMINAL HYSTERECTOMY  1980   complete  . BREAST BIOPSY  1970   normal  . DILATION AND CURETTAGE OF UTERUS    . ENDARTERECTOMY Right 07/12/2018   Procedure: ENDARTERECTOMY CAROTID;  Surgeon: Algernon Huxley, MD;  Location: [facility] ORS;  Service: Vascular;  Laterality: Right;  . TUBAL LIGATION      Current Medications: No outpatient medications have been marked as taking for the 07/15/20 encounter (Appointment) with Arvil Chaco, PA-C.     Allergies:   Patient has no known allergies.   Social History   Socioeconomic History  . Marital status: Widowed    Spouse name: Not on file  . Number of children: Not on file  . Years of education: Not on file  . Highest education level: Not on file  Occupational History  . Occupation: retired  Tobacco Use  . Smoking status:  Current Every Day Smoker    Packs/day: 2.00    Years: 60.00    Pack years: 120.00    Start date: 05/03/1963  . Smokeless tobacco: Never Used  . Tobacco comment: 1.5PPD 06/18/2020  Vaping Use  . Vaping Use: Never used  Substance and Sexual Activity  . Alcohol use: Yes    Alcohol/week: 0.0 standard drinks    Comment: [place] occasionally  . Drug use: No  . Sexual activity: Not on file  Other Topics Concern  . Not on file  Social History Narrative   Lives with husband. Has 11 pets in home. Has one son, lives locally.   Was born and raised in [place].   Husband is older than her and she provides care for him.      Work - retired. Invented water bottle for  pets   Social Determinants of Health   Financial Resource Strain: Not on file  Food Insecurity: Not on file  Transportation Needs: Not on file  Physical Activity: Not on file  Stress: Not on file  Social Connections: Not on file     Family History: The patient's ***family history includes Cancer in her father, mother, and sister; Lung cancer in her sister and sister.  ROS:   Please see the history of present illness.    *** All other systems reviewed and are negative.  EKGs/Labs/Other Studies Reviewed:    The following studies were reviewed today: ***  EKG:  EKG is *** ordered today.  The ekg ordered today demonstrates ***  Recent Labs: 01/17/2020: Hemoglobin 15.2; Platelets 210.0 06/01/2020: BUN 21; Creatinine, Ser 0.84; Potassium 4.1; Sodium 140  Recent Lipid Panel    Component Value Date/Time   CHOL 167 05/15/2019 1055   TRIG 93.0 05/15/2019 1055   HDL 58.10 05/15/2019 1055   CHOLHDL 3 05/15/2019 1055   VLDL 18.6 05/15/2019 1055   LDLCALC 90 05/15/2019 1055     Risk Assessment/Calculations:   {Does this patient have ATRIAL FIBRILLATION?:[phone]}   Physical Exam:    VS:  There were no vitals taken for this visit.    Wt Readings from Last 3 Encounters:  06/18/20 166 lb (75.3 kg)  06/12/20 166 lb 4.8 oz (75.4 kg)  04/20/20 161 lb 3.2 oz (73.1 kg)     GEN: *** Well nourished, well developed in no acute distress HEENT: Normal NECK: No JVD; No carotid bruits LYMPHATICS: No lymphadenopathy CARDIAC: ***RRR, no murmurs, rubs, gallops RESPIRATORY:  Clear to auscultation without rales, wheezing or rhonchi  ABDOMEN: Soft, non-tender, non-distended MUSCULOSKELETAL:  No edema; No deformity  SKIN: Warm and dry NEUROLOGIC:  Alert and oriented x 3 PSYCHIATRIC:  Normal affect   ASSESSMENT:    No diagnosis found. PLAN:    In order of problems listed above:  1. ***  Disposition: Follow up {follow up:15908} with ***   Shared Decision Making/Informed  Consent   {Are you ordering a CV Procedure (e.g. stress test, cath, DCCV, TEE, etc)?   Press F2        :[id number]}    Signed, Anelly Samarin Arlyss Repress  07/15/2020 6:47 AM    [facility]

## 2020-07-20 ENCOUNTER — Other Ambulatory Visit: Payer: Self-pay

## 2020-07-20 ENCOUNTER — Ambulatory Visit
Admission: RE | Admit: 2020-07-20 | Discharge: 2020-07-20 | Disposition: A | Discharge status: Home / Self Care | Payer: Medicare Other | Source: Ambulatory Visit | Attending: Nurse Practitioner | Admitting: Nurse Practitioner

## 2020-07-20 DIAGNOSIS — Z87891 Personal history of nicotine dependence: Secondary | ICD-10-CM | POA: Insufficient documentation

## 2020-07-20 DIAGNOSIS — J432 Centrilobular emphysema: Secondary | ICD-10-CM | POA: Diagnosis not present

## 2020-07-20 DIAGNOSIS — R918 Other nonspecific abnormal finding of lung field: Secondary | ICD-10-CM | POA: Diagnosis not present

## 2020-07-20 DIAGNOSIS — I2510 Atherosclerotic heart disease of native coronary artery without angina pectoris: Secondary | ICD-10-CM | POA: Diagnosis not present

## 2020-07-20 DIAGNOSIS — I358 Other nonrheumatic aortic valve disorders: Secondary | ICD-10-CM | POA: Diagnosis not present

## 2020-07-20 DIAGNOSIS — I712 Thoracic aortic aneurysm, without rupture: Secondary | ICD-10-CM | POA: Diagnosis not present

## 2020-07-22 ENCOUNTER — Other Ambulatory Visit: Payer: Self-pay | Admitting: Family

## 2020-07-23 ENCOUNTER — Telehealth: Payer: Self-pay | Admitting: *Deleted

## 2020-07-23 NOTE — Telephone Encounter (Signed)
Notified patient of LDCT lung cancer screening program results with recommendation for 6 month follow up imaging. Also notified of incidental findings noted below and is encouraged to discuss further with PCP who will receive a copy of this note and/or the CT report. Patient verbalizes understanding.   IMPRESSION: 1. Lung-RADS 3S, probably benign findings. Short-term follow-up in 6 months is recommended with repeat low-dose chest CT without contrast (please use the following order, "CT CHEST LCS NODULE FOLLOW-UP W/O CM"). 2. The "S" modifier above refers to potentially clinically significant non lung cancer related findings. Specifically, there is aortic atherosclerosis, in addition to left main and 3 vessel coronary artery disease. There is also aneurysmal dilatation of the ascending thoracic aorta (4.5 cm in diameter). Attention at time of routine annual low-dose lung cancer screening chest CT is recommended to ensure the stability of this finding. 3. Mild diffuse bronchial wall thickening with mild centrilobular and paraseptal emphysema; imaging findings suggestive of underlying COPD. 4. Cholelithiasis. 5. Nephrolithiasis.  Aortic Atherosclerosis (ICD10-I70.0) and Emphysema (ICD10-J43.9).

## 2020-07-27 NOTE — Telephone Encounter (Signed)
I spoke with patient & advised on below. She stated that she was not having an flank pain or abdominal pain. She said just the intermittent pain that is not severe, she stated she mention last visit. She said it is located under left rib cage &  thinks it is due to the way he leans to the left in her chair. I advised that she please let us know if she develops any new pain or if her pain worsens. As of now she was good & will let us know if she needs to be seen sooner than May. She knows that CT will be repeated in 6 months.

## 2020-07-27 NOTE — Telephone Encounter (Signed)
Call pt  I received results from your annual CT lung scan from [facility] which showed benign findings of lungs. You will require repeat testing in 6 months time and this is ordered/scheduled by shawn. Please let me know if you do not hear from him to get this scheduled.    It was noted again on the exam that you have coronary artery atherosclerosis, or often referred to as hardening of the arteries.  Please continue lipitor and following with cardiology  Image also shows gallstone and non obstructing left renal stone. If she is having abdominal pain or flank, please let me know and sch an appointment.

## 2020-08-06 ENCOUNTER — Other Ambulatory Visit: Payer: Self-pay | Admitting: *Deleted

## 2020-08-06 MED ORDER — DILTIAZEM HCL ER COATED BEADS 240 MG PO CP24: 240.0000 mg | ORAL_CAPSULE | Freq: Every day | ORAL | 0 refills | Status: DC

## 2020-08-21 ENCOUNTER — Ambulatory Visit: Payer: Medicare Other | Admitting: Family

## 2020-09-09 ENCOUNTER — Encounter: Payer: Self-pay | Admitting: Family

## 2020-09-09 ENCOUNTER — Other Ambulatory Visit: Payer: Self-pay

## 2020-09-09 ENCOUNTER — Ambulatory Visit (INDEPENDENT_AMBULATORY_CARE_PROVIDER_SITE_OTHER): Payer: Medicare Other | Admitting: Family

## 2020-09-09 VITALS — BP 132/80 | HR 76 | Temp 97.9°F | Ht 66.0 in | Wt 164.4 lb

## 2020-09-09 DIAGNOSIS — I714 Abdominal aortic aneurysm, without rupture, unspecified: Secondary | ICD-10-CM

## 2020-09-09 DIAGNOSIS — I700 Atherosclerosis of aorta: Secondary | ICD-10-CM

## 2020-09-09 DIAGNOSIS — M79605 Pain in left leg: Secondary | ICD-10-CM

## 2020-09-09 DIAGNOSIS — I10 Essential (primary) hypertension: Secondary | ICD-10-CM

## 2020-09-09 DIAGNOSIS — I350 Nonrheumatic aortic (valve) stenosis: Secondary | ICD-10-CM

## 2020-09-09 LAB — LIPID PANEL
Cholesterol: 160 mg/dL (ref 0–200)
HDL: 51.1 mg/dL (ref >39.00)
LDL Cholesterol: 86 mg/dL (ref 0–99)
NonHDL: 108.45
Total CHOL/HDL Ratio: 3
Triglycerides: 111 mg/dL (ref 0.0–149.0)
VLDL: 22.2 mg/dL (ref 0.0–40.0)

## 2020-09-09 LAB — BASIC METABOLIC PANEL
BUN: 16 mg/dL (ref 6–23)
CO2: 30 mEq/L (ref 19–32)
Calcium: 9.3 mg/dL (ref 8.4–10.5)
Chloride: 103 mEq/L (ref 96–112)
Creatinine, Ser: 0.97 mg/dL (ref 0.40–1.20)
GFR: 58.47 mL/min — ABNORMAL LOW (ref >60.00)
Glucose, Bld: 90 mg/dL (ref 70–99)
Potassium: 4.2 mEq/L (ref 3.5–5.1)
Sodium: 141 mEq/L (ref 135–145)

## 2020-09-09 LAB — TSH: TSH: 4.6 u[IU]/mL — ABNORMAL HIGH (ref 0.35–4.50)

## 2020-09-09 LAB — HEMOGLOBIN A1C: Hgb A1c MFr Bld: 5.8 % (ref 4.6–6.5)

## 2020-09-09 LAB — B12 AND FOLATE PANEL
Folate: >24.4 ng/mL (ref >5.9)
Vitamin B-12: 525 pg/mL (ref 211–911)

## 2020-09-09 NOTE — Assessment & Plan Note (Signed)
Chronic. She upcoming appointment with Dr Lucky Cowboy regarding surveillance and US imaging ordered.

## 2020-09-09 NOTE — Progress Notes (Signed)
Subjective:    Patient ID: Tracy Bell, female    DOB: 13-Jul-1950, 70 y.o.   MRN: [id number]  CC: KADA FRIESEN is a 70 y.o. female who presents today for follow up.   HPI: Complains of distal numbness over left chin, intermittent, which she noticed over the past month.  No leg swelling, rash, pain in calf when walking, redness or discoloration. .   She reports occasional low back pain with shooting pain down posterior buttocks to either thigh. Denies back pain or numbness currently. Endorses low back 'stiffness'.   HTN- compliant with losartan 50mg . She is not on the losartan 25mg . She has not been checking blood pressure at home.   She will occasionally feel palpitations which she feels has improved from baseline. No associated CP , dizziness.    Breathing at baseline. She is compliant with Stioloto.   CT lung cancer screen showed known atherosclerosis, known Mild aneurysmal dilatation of the ascending thoracic aorta. Resolved previously noted nodules. Gallstone.  Repeat in 6 months  Following with [name] for paroxysmal atrial flutter, aortic stenosis, CAD.  Compliant with diltazem 240mg , eliquis 5mg  BID, atorvastatin 80mg    Last saw Dr 10/2019 to follow carotid stenosis and abdominal aneurysm. Follow up scheduled 10/27/20    HISTORY:  Past Medical History:  Diagnosis Date  . Anxiety   . COPD (chronic obstructive pulmonary disease) ([place])   . Cough   . Depression   . Dizziness   . Dyspnea   . Heart murmur   . Hyperlipidemia   . Hypertension   . Lightheadedness   . Personal history of tobacco use, presenting hazards to health 08/28/2015   Past Surgical History:  Procedure Laterality Date  . ABDOMINAL HYSTERECTOMY  1980   complete  . BREAST BIOPSY  1970   normal  . DILATION AND CURETTAGE OF UTERUS    . ENDARTERECTOMY Right 07/12/2018   Procedure: ENDARTERECTOMY CAROTID;  Surgeon: Algernon Huxley, MD;  Location: [facility] ORS;  Service: Vascular;  Laterality:  Right;  . TUBAL LIGATION     Family History  Problem Relation Age of Onset  . Cancer Mother        Pancreatic Cancer   . Cancer Father        Stomach Cancer   . Lung cancer Sister   . Cancer Sister        ovarian  . Lung cancer Sister     Allergies: Patient has no known allergies. Current Outpatient Medications on File Prior to Visit  Medication Sig Dispense Refill  . apixaban (ELIQUIS) 5 MG TABS tablet Take 1 tablet (5 mg total) by mouth 2 (two) times daily. 60 tablet 11  . cholecalciferol (VITAMIN D3) 25 MCG (1000 UT) tablet Take 1,000 Units by mouth daily.    Marland Kitchen diltiazem (CARDIZEM CD) 240 MG 24 hr capsule Take 1 capsule (240 mg total) by mouth daily. 90 capsule 0  . losartan (COZAAR) 50 MG tablet Take 1 tablet (50 mg total) by mouth daily. 90 tablet 1  . Multiple Vitamins-Minerals (CENTRUM ADULTS) TABS Take 1 tablet by mouth daily. 30 tablet 6  . Tiotropium Bromide-Olodaterol (STIOLTO RESPIMAT) 2.5-2.5 MCG/ACT AERS Inhale 2 puffs into the lungs daily. 4 g 0  . atorvastatin (LIPITOR) 80 MG tablet Take 1 tablet (80 mg total) by mouth daily. 90 tablet 3  . COVID-19 mRNA Vac-TriS, Pfizer, SUSP injection USE AS DIRECTED (Patient not taking: Reported on 09/09/2020) .3 mL 0  . losartan (COZAAR) 25  MG tablet TAKE 1 TABLET (25 MG TOTAL) BY MOUTH DAILY. (Patient not taking: Reported on 09/09/2020) 90 tablet 1  . nicotine (NICODERM CQ) 21 mg/24hr patch Place 1 patch (21 mg total) onto the skin daily. (Patient not taking: Reported on 09/09/2020) 28 patch 3  . nicotine polacrilex (COMMIT) 4 MG lozenge Take 1 lozenge (4 mg total) by mouth as needed for smoking cessation. (Patient not taking: Reported on 09/09/2020) 100 tablet 3   No current facility-administered medications on file prior to visit.    Social History   Tobacco Use  . Smoking status: Current Every Day Smoker    Packs/day: 2.00    Years: 60.00    Pack years: 120.00    Start date: 05/03/1963  . Smokeless tobacco: Never Used  .  Tobacco comment: 1.5PPD 06/18/2020  Vaping Use  . Vaping Use: Never used  Substance Use Topics  . Alcohol use: Yes    Alcohol/week: 0.0 standard drinks    Comment: [place] occasionally  . Drug use: No    Review of Systems  Constitutional: Negative for chills and fever.  Respiratory: Negative for cough.   Cardiovascular: Positive for palpitations. Negative for chest pain and leg swelling.  Gastrointestinal: Negative for nausea and vomiting.  Neurological: Positive for numbness. Negative for dizziness.      Objective:    BP 132/80   Pulse 76   Temp 97.9 F (36.6 C) (Oral)   Ht 5\' 6"  (1.676 m)   Wt 164 lb 6.4 oz (74.6 kg)   SpO2 96%   BMI 26.53 kg/m  BP Readings from Last 3 Encounters:  09/09/20 132/80  07/15/20 120/78  06/18/20 126/74   Wt Readings from Last 3 Encounters:  09/09/20 164 lb 6.4 oz (74.6 kg)  07/15/20 165 lb (74.8 kg)  06/18/20 166 lb (75.3 kg)    Physical Exam Vitals reviewed.  Constitutional:      Appearance: She is well-developed.  Eyes:     Conjunctiva/sclera: Conjunctivae normal.  Cardiovascular:     Rate and Rhythm: Normal rate and regular rhythm.     Pulses: Normal pulses.     Heart sounds: Murmur heard.   Systolic murmur is present with a grade of 2/6.     Comments: SEM II/VI, Loudest LSB, non radiating, no thrill  No LE edema, palpable cords or masses. No erythema or increased warmth. No asymmetry in calf size when compared bilaterally Scant hair growth bilaterally.  No discoloration or varicosities noted. LE warm and palpable pedal pulses. Sensation intact bilaterally.   Pulmonary:     Effort: Pulmonary effort is normal.     Breath sounds: Normal breath sounds. No wheezing, rhonchi or rales.  Musculoskeletal:     Right lower leg: No edema.     Left lower leg: No edema.  Skin:    General: Skin is warm and dry.  Neurological:     Mental Status: She is alert.  Psychiatric:        Speech: Speech normal.        Behavior:  Behavior normal.        Thought Content: Thought content normal.        Assessment & Plan:   Problem List Items Addressed This Visit      Cardiovascular and Mediastinum   Abdominal aneurysm ([place])    Chronic. She upcoming appointment with Dr Lucky Cowboy regarding surveillance and US imaging ordered.       Aortic stenosis, mild    Chronic stable. Asymptomatic  and following with cardiology in regards to surveillance.       Atherosclerosis of aorta (HCC)    Chronic, stable. Seen again CT chest 06/2020. Continue lipitor 80mg . Pending lipid panel.       Essential hypertension - Primary    Controlled. Continue losartan 50mg  and diltiazem 240mg .       Relevant Orders   B12 and Folate Panel   Basic metabolic panel   TSH   Hemoglobin A1c   Lipid panel     Other   Leg pain, lateral, left    Chronic, unchanged. Benign exam however expressed by concern regarding known atherosclerosis,  CAD in regards to symptoms raising concern for PAD. She is also a smoker. Strongly recommend Lumbar XR , left leg XR, and duplex studies with Dr Lucky Cowboy. She declines further evaluation. Pending baseline labs to look metabolic etiology although I think less likely.           I am having Tracy Bell maintain her Centrum Adults, cholecalciferol, apixaban, atorvastatin, losartan, Stiolto Respimat, nicotine, nicotine polacrilex, losartan, diltiazem, and COVID-19 mRNA Vac-TriS [employer]).   No orders of the defined types were placed in this encounter.   Return precautions given.   Risks, benefits, and alternatives of the medications and treatment plan prescribed today were discussed, and patient expressed understanding.   Education regarding symptom management and diagnosis given to patient on AVS.  Continue to follow with Burnard Hawthorne, FNP for routine health maintenance.   Jeffery M Rockers and I agreed with plan.   [name], FNP

## 2020-09-09 NOTE — Assessment & Plan Note (Signed)
Chronic stable. Asymptomatic and following with cardiology in regards to surveillance.

## 2020-09-09 NOTE — Assessment & Plan Note (Signed)
Chronic, unchanged. Benign exam however expressed by concern regarding known atherosclerosis,  CAD in regards to symptoms raising concern for PAD. She is also a smoker. Strongly recommend Lumbar XR , left leg XR, and duplex studies with Dr Lucky Cowboy. She declines further evaluation. Pending baseline labs to look metabolic etiology although I think less likely.

## 2020-09-09 NOTE — Patient Instructions (Signed)
Nice to see you Please keep follow up with Dr Lucky Cowboy

## 2020-09-09 NOTE — Assessment & Plan Note (Addendum)
Chronic, stable. Seen again CT chest 06/2020. Continue lipitor 80mg . Pending lipid panel.

## 2020-09-09 NOTE — Assessment & Plan Note (Signed)
Controlled. Continue losartan 50mg  and diltiazem 240mg .

## 2020-09-14 ENCOUNTER — Other Ambulatory Visit: Payer: Self-pay | Admitting: Family

## 2020-09-14 ENCOUNTER — Telehealth: Payer: Self-pay

## 2020-09-14 DIAGNOSIS — R946 Abnormal results of thyroid function studies: Secondary | ICD-10-CM

## 2020-09-14 NOTE — Telephone Encounter (Signed)
LMTCB for lab results.  

## 2020-09-22 DIAGNOSIS — H10503 Unspecified blepharoconjunctivitis, bilateral: Secondary | ICD-10-CM | POA: Diagnosis not present

## 2020-10-19 ENCOUNTER — Other Ambulatory Visit (INDEPENDENT_AMBULATORY_CARE_PROVIDER_SITE_OTHER): Payer: Medicare Other

## 2020-10-19 ENCOUNTER — Other Ambulatory Visit: Payer: Self-pay

## 2020-10-19 DIAGNOSIS — R946 Abnormal results of thyroid function studies: Secondary | ICD-10-CM

## 2020-10-19 LAB — TSH: TSH: 6.38 u[IU]/mL — ABNORMAL HIGH (ref 0.35–4.50)

## 2020-10-19 LAB — T3, FREE: T3, Free: 3.1 pg/mL (ref 2.3–4.2)

## 2020-10-19 LAB — T4, FREE: Free T4: 0.68 ng/dL (ref 0.60–1.60)

## 2020-10-26 ENCOUNTER — Other Ambulatory Visit: Payer: Self-pay | Admitting: Family

## 2020-10-26 DIAGNOSIS — R7989 Other specified abnormal findings of blood chemistry: Secondary | ICD-10-CM

## 2020-10-27 ENCOUNTER — Ambulatory Visit (INDEPENDENT_AMBULATORY_CARE_PROVIDER_SITE_OTHER): Payer: Medicare Other

## 2020-10-27 ENCOUNTER — Other Ambulatory Visit: Payer: Self-pay

## 2020-10-27 ENCOUNTER — Ambulatory Visit (INDEPENDENT_AMBULATORY_CARE_PROVIDER_SITE_OTHER): Payer: Medicare Other | Admitting: Vascular Surgery

## 2020-10-27 VITALS — BP 162/82 | HR 76 | Ht 66.0 in | Wt 156.0 lb

## 2020-10-27 DIAGNOSIS — I10 Essential (primary) hypertension: Secondary | ICD-10-CM

## 2020-10-27 DIAGNOSIS — M79605 Pain in left leg: Secondary | ICD-10-CM

## 2020-10-27 DIAGNOSIS — I6523 Occlusion and stenosis of bilateral carotid arteries: Secondary | ICD-10-CM | POA: Diagnosis not present

## 2020-10-27 DIAGNOSIS — M79604 Pain in right leg: Secondary | ICD-10-CM | POA: Diagnosis not present

## 2020-10-27 DIAGNOSIS — F17200 Nicotine dependence, unspecified, uncomplicated: Secondary | ICD-10-CM

## 2020-10-27 DIAGNOSIS — I714 Abdominal aortic aneurysm, without rupture, unspecified: Secondary | ICD-10-CM

## 2020-10-27 NOTE — Progress Notes (Signed)
MRN : [id number]  Tracy Bell is a 70 y.o. (1950/08/06) female who presents with chief complaint of  Chief Complaint  Patient presents with   Follow-up    [place]  .  History of Present Illness: Patient presents in follow-up of multiple vascular issues.  One of her primary complaints today is of foot pain.  She has pain in the ball of her foot and in her heel.  This is unrelated to activity.  This is in both feet.  No open wounds or infection.  This does not wake her from sleep.   She is also status post carotid endarterectomy in the past on the right side for high-grade stenosis.  No focal neurologic symptoms.  Carotid duplex today shows a widely patent right carotid endarterectomy with 1 to 39% left ICA stenosis. She is also followed for her abdominal aortic aneurysm.  No aneurysm related symptoms. Specifically, the patient denies new back or abdominal pain, or signs of peripheral embolization. Duplex today shows some increase in the size of her abdominal aortic aneurysm now measuring 4.2 cm in maximal diameter.  This is increased about 3 mm in the past year and a half.  Current Outpatient Medications  Medication Sig Dispense Refill   apixaban (ELIQUIS) 5 MG TABS tablet Take 1 tablet (5 mg total) by mouth 2 (two) times daily. 60 tablet 11   cholecalciferol (VITAMIN D3) 25 MCG (1000 UT) tablet Take 1,000 Units by mouth daily.     COVID-19 mRNA Vac-TriS, Pfizer, SUSP injection USE AS DIRECTED .3 mL 0   diltiazem (CARDIZEM CD) 240 MG 24 hr capsule Take 1 capsule (240 mg total) by mouth daily. 90 capsule 0   losartan (COZAAR) 25 MG tablet TAKE 1 TABLET (25 MG TOTAL) BY MOUTH DAILY. 90 tablet 1   losartan (COZAAR) 50 MG tablet Take 1 tablet (50 mg total) by mouth daily. 90 tablet 1   Multiple Vitamins-Minerals (CENTRUM ADULTS) TABS Take 1 tablet by mouth daily. 30 tablet 6   Tiotropium Bromide-Olodaterol (STIOLTO RESPIMAT) 2.5-2.5 MCG/ACT AERS Inhale 2 puffs into the lungs daily. 4 g 0    atorvastatin (LIPITOR) 80 MG tablet Take 1 tablet (80 mg total) by mouth daily. 90 tablet 3   No current facility-administered medications for this visit.    Past Medical History:  Diagnosis Date   Anxiety    COPD (chronic obstructive pulmonary disease) (HCC)    Cough    Depression    Dizziness    Dyspnea    Heart murmur    Hyperlipidemia    Hypertension    Lightheadedness    Personal history of tobacco use, presenting hazards to health 08/28/2015    Past Surgical History:  Procedure Laterality Date   ABDOMINAL HYSTERECTOMY  1980   complete   BREAST BIOPSY  1970   normal   DILATION AND CURETTAGE OF UTERUS     ENDARTERECTOMY Right 07/12/2018   Procedure: ENDARTERECTOMY CAROTID;  Surgeon: Algernon Huxley, MD;  Location: [facility] ORS;  Service: Vascular;  Laterality: Right;   TUBAL LIGATION       Social History   Tobacco Use   Smoking status: Every Day    Packs/day: 2.00    Years: 60.00    Pack years: 120.00    Types: Cigarettes    Start date: 05/03/1963   Smokeless tobacco: Never   Tobacco comments:    1.5PPD 06/18/2020  Vaping Use   Vaping Use: Never used  Substance Use Topics  Alcohol use: Yes    Alcohol/week: 0.0 standard drinks    Comment: [place] occasionally   Drug use: No      Family History  Problem Relation Age of Onset   Cancer Mother        Pancreatic Cancer    Cancer Father        Stomach Cancer    Lung cancer Sister    Cancer Sister        ovarian   Lung cancer Sister      No Known Allergies   REVIEW OF SYSTEMS (Negative unless checked)   Constitutional: [] Weight loss  [] Fever  [] Chills Cardiac: [] Chest pain   [] Chest pressure   [] Palpitations   [] Shortness of breath when laying flat   [] Shortness of breath at rest   [] Shortness of breath with exertion. Vascular:  [] Pain in legs with walking   [] Pain in legs at rest   [] Pain in legs when laying flat   [] Claudication   [] Pain in feet when walking  [] Pain in feet at rest  [] Pain in feet  when laying flat   [] History of DVT   [] Phlebitis   [] Swelling in legs   [] Varicose veins   [] Non-healing ulcers Pulmonary:   [] Uses home oxygen   [] Productive cough   [] Hemoptysis   [] Wheeze  [x] COPD   [] Asthma Neurologic:  [x] Dizziness  [] Blackouts   [] Seizures   [] History of stroke   [] History of TIA  [] Aphasia   [] Temporary blindness   [] Dysphagia   [] Weakness or numbness in arms   [] Weakness or numbness in legs Musculoskeletal:  [x] Arthritis   [] Joint swelling   [] Joint pain   [] Low back pain Hematologic:  [] Easy bruising  [] Easy bleeding   [] Hypercoagulable state   [] Anemic   Gastrointestinal:  [] Blood in stool   [] Vomiting blood  [] Gastroesophageal reflux/heartburn   [] Abdominal pain Genitourinary:  [] Chronic kidney disease   [] Difficult urination  [] Frequent urination  [] Burning with urination   [] Hematuria Skin:  [] Rashes   [] Ulcers   [] Wounds Psychological:  [x] History of anxiety   [x]  History of major depression.  Physical Examination  Vitals:   10/27/20 1056  BP: (!) 162/82  Pulse: 76  Weight: 156 lb (70.8 kg)  Height: 5\' 6"  (1.676 m)   Body mass index is 25.18 kg/m. Gen:  WD/WN, NAD. Appears younger than stated age. Head: [place]/AT, No temporalis wasting. Ear/Nose/Throat: Hearing grossly intact, nares w/o erythema or drainage, trachea midline Eyes: Conjunctiva clear. Sclera non-icteric Neck: Supple.  No bruit  Pulmonary:  Good air movement, equal and clear to auscultation bilaterally.  Cardiac: RRR, No JVD Vascular:  Vessel Right Left  Radial Palpable Palpable       Musculoskeletal: M/S 5/5 throughout.  No deformity or atrophy. No edema. Neurologic: CN 2-12 intact. Sensation grossly intact in extremities.  Symmetrical.  Speech is fluent. Motor exam as listed above. Psychiatric: Judgment intact, Mood & affect appropriate for pt's clinical situation. Dermatologic: No rashes or ulcers noted.  No cellulitis or open wounds.     CBC Lab Results  Component Value Date    WBC 8.1 01/17/2020   HGB 15.2 (H) 01/17/2020   HCT 44.7 01/17/2020   MCV 91.9 01/17/2020   PLT 210.0 01/17/2020    BMET    Component Value Date/Time   NA 141 09/09/2020 1114   K 4.2 09/09/2020 1114   CL 103 09/09/2020 1114   CO2 30 09/09/2020 1114   GLUCOSE 90 09/09/2020 1114   BUN 16 09/09/2020  1114   CREATININE 0.97 09/09/2020 1114   CALCIUM 9.3 09/09/2020 1114   GFRNONAA >60 07/13/2018 0425   GFRAA >60 07/13/2018 0425   CrCl cannot be calculated (Patient's most recent lab result is older than the maximum 21 days allowed.).  COAG Lab Results  Component Value Date   INR 1.0 07/03/2018    Radiology No results found.   Assessment/Plan Essential hypertension blood pressure control important in reducing the progression of atherosclerotic disease. On appropriate oral medications.     Tobacco use disorder Represents an atherosclerotic risk factor for progression of her carotid disease as well as aneurysmal growth.  Carotid stenosis, bilateral Carotid duplex today shows a widely patent right carotid endarterectomy with 1 to 39% left ICA stenosis.  Continue current medical regimen which includes anticoagulation and statin agent.  Recheck in 1 year.  Abdominal aneurysm (HCC) Duplex today shows some increase in the size of her abdominal aortic aneurysm now measuring 4.2 cm in maximal diameter.  This is increased about 3 mm in the past year and a half.  We would now follow this on 46-month intervals due to the increase in size over 4 cm.  Pain in limb Although her pain does not sound entirely vascular, I have offered her noninvasive studies to assess her perfusion to see if this may be a contributing factor.  She does not want to have that done at this time.    Leotis Pain, MD  10/28/2020 5:18 PM    This note was created with Dragon medical transcription system.  Any errors from dictation are purely unintentional

## 2020-10-28 DIAGNOSIS — M79609 Pain in unspecified limb: Secondary | ICD-10-CM | POA: Insufficient documentation

## 2020-10-28 NOTE — Assessment & Plan Note (Signed)
Duplex today shows some increase in the size of her abdominal aortic aneurysm now measuring 4.2 cm in maximal diameter.  This is increased about 3 mm in the past year and a half.  We would now follow this on 77-month intervals due to the increase in size over 4 cm.

## 2020-10-28 NOTE — Assessment & Plan Note (Signed)
Carotid duplex today shows a widely patent right carotid endarterectomy with 1 to 39% left ICA stenosis.  Continue current medical regimen which includes anticoagulation and statin agent.  Recheck in 1 year.

## 2020-10-28 NOTE — Assessment & Plan Note (Signed)
Although her pain does not sound entirely vascular, I have offered her noninvasive studies to assess her perfusion to see if this may be a contributing factor.  She does not want to have that done at this time.

## 2020-11-13 ENCOUNTER — Other Ambulatory Visit: Payer: Self-pay | Admitting: *Deleted

## 2020-11-13 MED ORDER — DILTIAZEM HCL ER COATED BEADS 240 MG PO CP24: 240.0000 mg | ORAL_CAPSULE | Freq: Every day | ORAL | 0 refills | Status: DC

## 2020-11-24 ENCOUNTER — Ambulatory Visit: Payer: Medicare Other | Admitting: Physician Assistant

## 2020-11-24 ENCOUNTER — Other Ambulatory Visit: Payer: Self-pay

## 2020-11-24 ENCOUNTER — Encounter: Payer: Self-pay | Admitting: Physician Assistant

## 2020-11-24 VITALS — BP 140/94 | HR 73 | Ht 66.0 in | Wt 161.0 lb

## 2020-11-24 DIAGNOSIS — I471 Supraventricular tachycardia, unspecified: Secondary | ICD-10-CM

## 2020-11-24 DIAGNOSIS — Z7901 Long term (current) use of anticoagulants: Secondary | ICD-10-CM

## 2020-11-24 DIAGNOSIS — Z8679 Personal history of other diseases of the circulatory system: Secondary | ICD-10-CM | POA: Diagnosis not present

## 2020-11-24 DIAGNOSIS — I7121 Aneurysm of the ascending aorta, without rupture: Secondary | ICD-10-CM

## 2020-11-24 DIAGNOSIS — I4892 Unspecified atrial flutter: Secondary | ICD-10-CM | POA: Diagnosis not present

## 2020-11-24 DIAGNOSIS — R911 Solitary pulmonary nodule: Secondary | ICD-10-CM

## 2020-11-24 DIAGNOSIS — I5033 Acute on chronic diastolic (congestive) heart failure: Secondary | ICD-10-CM | POA: Diagnosis not present

## 2020-11-24 DIAGNOSIS — I6523 Occlusion and stenosis of bilateral carotid arteries: Secondary | ICD-10-CM | POA: Diagnosis not present

## 2020-11-24 DIAGNOSIS — Z720 Tobacco use: Secondary | ICD-10-CM

## 2020-11-24 DIAGNOSIS — I10 Essential (primary) hypertension: Secondary | ICD-10-CM

## 2020-11-24 DIAGNOSIS — I350 Nonrheumatic aortic (valve) stenosis: Secondary | ICD-10-CM

## 2020-11-24 DIAGNOSIS — J449 Chronic obstructive pulmonary disease, unspecified: Secondary | ICD-10-CM | POA: Diagnosis not present

## 2020-11-24 DIAGNOSIS — M79605 Pain in left leg: Secondary | ICD-10-CM

## 2020-11-24 DIAGNOSIS — E785 Hyperlipidemia, unspecified: Secondary | ICD-10-CM

## 2020-11-24 DIAGNOSIS — R002 Palpitations: Secondary | ICD-10-CM

## 2020-11-24 DIAGNOSIS — I2510 Atherosclerotic heart disease of native coronary artery without angina pectoris: Secondary | ICD-10-CM

## 2020-11-24 DIAGNOSIS — I712 Thoracic aortic aneurysm, without rupture: Secondary | ICD-10-CM

## 2020-11-24 NOTE — Patient Instructions (Addendum)
Medication Instructions:   Please call [phone] for Eliquis Patient Assistance.  Or go to [email or web address] to fill out a patient assistance application and bring [place] the completed forms.  *If you need a refill on your cardiac medications before your next appointment, please call your pharmacy*   Lab Work: None ordered If you have labs (blood work) drawn today and your tests are completely normal, you will receive your results only by: [place] (if you have MyChart) OR A paper copy in the mail If you have any lab test that is abnormal or we need to change your treatment, we will call you to review the results.   Testing/Procedures: None ordered   Follow-Up: At [facility], you and your health needs are our priority.  As part of our continuing mission to provide you with exceptional heart care, we have created designated Provider Care Teams.  These Care Teams include your primary Cardiologist (physician) and Advanced Practice Providers (APPs -  Physician Assistants and Nurse Practitioners) who all work together to provide you with the care you need, when you need it.  We recommend signing up for the patient portal called "MyChart".  Sign up information is provided on this After Visit Summary.  MyChart is used to connect with patients for Virtual Visits (Telemedicine).  Patients are able to view lab/test results, encounter notes, upcoming appointments, etc.  Non-urgent messages can be sent to your provider as well.   To learn more about what you can do with MyChart, go to [email or web address].    Your next appointment:   Beginning of November   The format for your next appointment:   In Person  Provider:   You may see Kathlyn Sacramento, MD or one of the following Advanced Practice Providers on your designated Care Team:   Murray Hodgkins, NP Christell Faith, PA-C [name], PA-C Cadence Kathlen Mody, [place]   Other Instructions  Medication Samples have been provided to the  patient, in addition to the link for Eliquis PAF, and their direct phone number as patient is currently in the donut hole.  Drug name: Eliquis       Strength: 5 MG        Qty: 2 boxes  LOT: [id number]  Exp.Date: 5/24

## 2020-11-24 NOTE — Progress Notes (Signed)
Office Visit    Patient Name: Tracy Bell Date of Encounter: 11/24/2020  Primary Care Provider:  Burnard Hawthorne, FNP Primary Cardiologist:  Dr. Fletcher Anon  Chief Complaint    Chief Complaint  Patient presents with   Follow-up   70 year old female with history of SVT, new atrial flutter (8/23) with variable block, Mobitz I (Wenckebach), COPD, hyperlipidemia, aortic stenosis, AAA (3.2cm, upper limits of normal with recommendation for recheck 09/2020), hypertension, HLD, carotid disease s/p right carotid endarterectomy (07/2018), tobacco use (1-2 packs daily), and seen today for 3 month follow-up.  Past Medical History    Past Medical History:  Diagnosis Date   Anxiety    COPD (chronic obstructive pulmonary disease) (HCC)    Cough    Depression    Dizziness    Dyspnea    Heart murmur    Hyperlipidemia    Hypertension    Lightheadedness    Personal history of tobacco use, presenting hazards to health 08/28/2015   Past Surgical History:  Procedure Laterality Date   ABDOMINAL HYSTERECTOMY  1980   complete   BREAST BIOPSY  1970   normal   DILATION AND CURETTAGE OF UTERUS     ENDARTERECTOMY Right 07/12/2018   Procedure: ENDARTERECTOMY CAROTID;  Surgeon: Algernon Huxley, MD;  Location: [facility] ORS;  Service: Vascular;  Laterality: Right;   TUBAL LIGATION      Allergies  No Known Allergies  History of Present Illness    Tracy Bell is a 70 y.o. female with PMH as above.  She was seen in the past by Dr. Saralyn Pilar. It has been noted her previous   --CT scan showed evidence of CAC.  --05/2019 Zio was performed and showed 100 SVT runs, with the fastest lasting 1 min 26 seconds with max rate of 197bpm and the longest 50 min and 54 seconds with average rate of 144 bm. The MD notification criteria for SVT was met. Also noted was second degree AVB - Mobitz I (Wenckebach) with sleep study recommended at RTC but politely declined per patient. She was started on diltiazem.  --Echo  06/2019 as below showed EF 60-65%, G2DD, mild TR, mild to moderate AS with mean gradient 32mHg and valve area 1.4cm2. Mildly elevated PASP was also noted with RVSP 32.870mg.  She has history of elevated BP with antihypertensive tx increased by both PCP and cardiology.. Vascular studies as below with RAS seen on images and recommendation for follow-up. Salt, tobacco, EtOH, and caffeine discussed. She has not quit smoking and does not drink EtOH. She is working on cutting back caffeine.  She has been seen by seen vascular surgery with recommendation to continue to monitor her vascular dz, including AAA dilation, renal dilation/stenosis, and vascular plaque. Most recent 10/2019 scan showed infrarenal aorta at 3.2cm, which is at the upper limits of normal, and with recommendation for duplex recheck in 1 year or ~ 09/2020. CT also showed L renal artery had heavily calcified atherosclerotic plaque of the proximal left renal artery and at least moderate focal stenosis, as well as heavily calcified atherosclerotic plaque of the iliac arteries bilaterally with multifocal regions of mild to moderate stenosis.  She is followed for her carotids with bilateral stenosis.  Most recent 2022 studies as below with left mild stenosis of the carotids and increased abdominal aortic aneurysm size to 4.2cm (see below).  She has h/o  fatigue and Mobitz I with sleep study recommended but deferred.   She has tachypalpitations and diagnosed  with new atrial flutter (12/23/19). She was started on anticoagulation with diltiazem dose increased.   EP referral and sleep study deferred.    She has SOB/DOE and known COPD/emphysema. AS has been discussed as possibly contributing as well with any intervention to valve deferred. She was prescribed Chantix but declined to start this medication. Pulmonary referral recommended for COPD/ emphysema.  PFTs 08/2019 recommend OP pulmonology referral. 04/2020 CT scan shows interval development of new left  upper lobe and lower lobe pulmonary nodules.  Recommendation was for follow-up chest CT without contrast in 3 months.  PET scan was noted to also be a possibility for repeat imaging.    She has reported dysphagia and lymphadenopathy with ENT referral recommended and subsequent head CT performed as under studies but without malignancy.  GI referral recommended for dysphagia and nausea but deferred.  When seen 04/16/2020,  BP 160/100. She had not taken her diltiazem.  She was up 4 pounds up from her previous clinic weight but not felt to be volume overloaded.  Due to increased cost of Eliquis, samples provided. She decliend sleep study and lower extremity arterial studies until out of her donut hole.  She continued to smoke at 1-1/2 packs/day and not yet cut back.  She had some dizziness with standing.    Seen 3/22 and doing the same. She reported right neck pulsations with episodes of atrial fibrillation/flutter. She was still smoking. Sleep study and EP referral again reviewed with patient preference to defer. She also defers updated echo for her known AV dz, though she does note ongoing shortness of breath/dyspnea.  She has been following with pulmonology for her COPD/emphysema, and she notes that she has been given some breathing exercises that seem to be helping her. She reports left-sided hip pain and bilateral lower extremity burning/claudication symptoms with walking, as well as LE coldness.  She has been following with vascular surgery with recommendation she update lower extremity studies, carotid studies, and CTA as indicated by vascular surgery. She was occasionally forgetting her second dose of Eliquis.      Recent updated duplex shows increase in the size of her abdominal aortic aneurysm, now measuring 4.2 cm in maximum diameter.  This is increased 3 mm from the past year and a half.  Carotid duplex shows widely patent right carotid endarterectomy with 1 to 39% left ICA stenosis.  Recommendation  was for recheck of carotids in 1 year.  Monitoring of abdominal aneurysm was increased in frequency to every 6 months, given size of a 4 cm.  Lower extremity studies for her reported bilateral lower extremity pain as outlined below were discussed with patient preference to defer.  Today, 11/24/2020, she returns to clinic and notes that her breathing is worse when compared with previous clinic visits.  She feels that her breathing becomes worse with the heat wave.  Sometimes, her shortness of breath lasts all day.  She reports swollen bilateral feet and 1+ bilateral edema.  She reports that her left leg will occasionally go numb and her right foot is more swollen than that of her left.  She is in the donut hole and unable to afford her Eliquis.  She reports that she still forgets her second dose of Eliquis at times.  She would likely benefit from transition to [place] in the future as discussed today.  It was recommended she reach out to her insurance company to see if this is covered, given her recent Eliquis cost.  SBP 130s with DBP  60 to 80s at home.  When she has an episode of atrial fibrillation/SVT, she reports SBP in the 80s to 90s.  We discussed that this is likely an error and due to the ectopy registered on her BP cuff.  Her heart rate will occasionally elevate into the 140s to 130s.  She reports ongoing tachypalpitations and a pulsation in her neck when these occur, though they only last a few seconds before dissipating. Of note, she reports that her home number is still working at this time but will be discontinued in the near future.  She has not yet quit smoking. She reports she will get her EKG done at her PCP. Home Medications    Current Outpatient Medications on File Prior to Visit  Medication Sig Dispense Refill   apixaban (ELIQUIS) 5 MG TABS tablet Take 1 tablet (5 mg total) by mouth 2 (two) times daily. 60 tablet 11   atorvastatin (LIPITOR) 80 MG tablet Take 1 tablet (80 mg total) by mouth  daily. 90 tablet 3   cholecalciferol (VITAMIN D3) 25 MCG (1000 UT) tablet Take 1,000 Units by mouth daily.     diltiazem (CARDIZEM CD) 240 MG 24 hr capsule Take 1 capsule (240 mg total) by mouth daily. 90 capsule 0   losartan (COZAAR) 50 MG tablet Take 1 tablet (50 mg total) by mouth daily. 90 tablet 1   Multiple Vitamins-Minerals (CENTRUM ADULTS) TABS Take 1 tablet by mouth daily. 30 tablet 6   Tiotropium Bromide-Olodaterol (STIOLTO RESPIMAT) 2.5-2.5 MCG/ACT AERS Inhale 2 puffs into the lungs daily. 4 g 0   No current facility-administered medications on file prior to visit.     Review of Systems    She denies chest pain, pnd, orthopnea, n, v,, syncope, or early satiety.  She reports increased  dyspnea and shortness of breath.  She has increased LEE and wt gain. She has bilateral LE pedal pain and L sided leg numbness. Her RLE pedal edema is greater than L. She reports brief but ongoing pulsation of the beck with SVT/Afib occurs. Resolution of dizziness.   All other systems reviewed and are otherwise negative except as noted above.  Physical Exam    VS:  BP (!) 140/94 (BP Location: Left Arm, Patient Position: Sitting, Cuff Size: Normal)   Pulse 73   Ht '5\' 6"'  (1.676 m)   Wt 161 lb (73 kg)   SpO2 96%   BMI 25.99 kg/m  , BMI Body mass index is 25.99 kg/m.   General: Well developed, well nourished, in no acute distress. Head: Normocephalic, atraumatic, sclera non-icteric, no xanthomas, nares are without discharge. Neck: L sided carotid bruit and pulsation of R neck appreciated. Lungs:Bilateral wheezing and coarse breath sounds. Breathing is unlabored. Heart: RRR, S1 S2 3/6 systolic murmur RUSB. No rubs, or gallops.  Abdomen: Soft, non-tender, non-distended with normoactive bowel sounds. No rebound/guarding. Extremities: Bilateral 1+ edema with pedal edema greater on R. Distal pedal pulses are 1+ and equal bilaterally. Neuro: Alert and oriented X 3. Moves all extremities  spontaneously. Psych:  Responds to questions appropriately with a normal affect.  Accessory Clinical Findings    ECG personally reviewed by me today no ekg  VITALS Reviewed today   Temp Readings from Last 3 Encounters:  09/09/20 97.9 F (36.6 C) (Oral)  06/18/20 (!) 97.1 F (36.2 C) (Temporal)  06/12/20 98 F (36.7 C) (Oral)   BP Readings from Last 3 Encounters:  11/24/20 (!) 140/94  10/27/20 (!) 162/82  09/09/20 132/80  Pulse Readings from Last 3 Encounters:  11/24/20 73  10/27/20 76  09/09/20 76    Wt Readings from Last 3 Encounters:  11/24/20 161 lb (73 kg)  10/27/20 156 lb (70.8 kg)  09/09/20 164 lb 6.4 oz (74.6 kg)     LABS  reviewed today    Lab Results  Component Value Date   WBC 8.1 01/17/2020   HGB 15.2 (H) 01/17/2020   HCT 44.7 01/17/2020   MCV 91.9 01/17/2020   PLT 210.0 01/17/2020   Lab Results  Component Value Date   CREATININE 0.97 09/09/2020   BUN 16 09/09/2020   NA 141 09/09/2020   K 4.2 09/09/2020   CL 103 09/09/2020   CO2 30 09/09/2020   Lab Results  Component Value Date   ALT 19 05/15/2019   AST 21 05/15/2019   ALKPHOS 74 05/15/2019   BILITOT 0.8 05/15/2019   Lab Results  Component Value Date   CHOL 160 09/09/2020   HDL 51.10 09/09/2020   LDLCALC 86 09/09/2020   TRIG 111.0 09/09/2020   CHOLHDL 3 09/09/2020    Lab Results  Component Value Date   HGBA1C 5.8 09/09/2020   Lab Results  Component Value Date   TSH 6.38 (H) 10/19/2020     STUDIES/PROCEDURES reviewed today   AAA duplex 09/2020 Summary:  Abdominal Aorta: There is evidence of abnormal dilatation of the proximal,  mid and distal Abdominal aorta. There is evidence of abnormal dilation of  the Right Common Iliac artery and Left Common Iliac artery. The largest  aortic measurement is 4.2 cm. CT  measured 3.9cms in 04/2019. Today's measurement appears to be slightly  increased. The largest aortic diameter has increased compared to prior  exam. Previous  diameter measurement was 3.9 cm obtained on 04/2019 CT.   Carotids 09/2020 Summary:  Right Carotid: There is no evidence of stenosis in the right ICA.  Left Carotid: Velocities in the left ICA are consistent with a 1-39%  stenosis.  Vertebrals:  Bilateral vertebral arteries demonstrate antegrade flow.  Subclavians: Normal flow hemodynamics were seen in bilateral subclavian               arteries.    Zio Predominantly NSR, HR 25-197 with average HR 65bpm. 100 SVT runs occurred. The run with the fastest interval lasted 1 minute and 26 seconds with maximum rate of 197 bpm, the longest lasting 50 min 54 seconds with an average rate of 144bpm. Second degree AVB Mobitz (Wenckebach) was present. SVT was detected within +- 45 seconds of symptomatic patient events. Isolated SVE were rare (<1.0%), SVE Couplets were rare (<1.0%), and SVE Triplets were rare (<1.0%). Isolated VE were rare (<1.0%), VE Couplets were rare (<1.0%), and no VE Triplets were present. MD notification for SVT met.    Echo 06/05/19  1. Left ventricular ejection fraction, by visual estimation, is 60 to  65%. The left ventricle has normal function. There is borderline left  ventricular hypertrophy.   2. Left ventricular diastolic parameters are consistent with Grade II  diastolic dysfunction (pseudonormalization).   3. The left ventricle has no regional wall motion abnormalities.   4. Global right ventricle has normal systolic function.The right  ventricular size is normal. Right vetricular wall thickness was not  assessed.   5. Left atrial size was normal.   6. Right atrial size was normal.   7. The mitral valve is normal in structure. No evidence of mitral valve  regurgitation.   8. The tricuspid valve is  normal in structure.   9. The tricuspid valve is normal in structure. Tricuspid valve  regurgitation is mild.  10. The aortic valve is abnormal. Aortic valve regurgitation is not  visualized. Mild to moderate aortic valve  stenosis.  11. Peak AV gradient 46mHg, MG 187mg, DVI 0.45, with LVOT diameter of  2cm, AVA 1.4cm2.  12. The pulmonic valve was not well visualized. Pulmonic valve  regurgitation is not visualized.  13. Mildly elevated pulmonary artery systolic pressure.  14. The inferior vena cava is normal in size with greater than 50%  respiratory variability, suggesting right atrial pressure of 3 mmHg.     Assessment & Plan    Paroxysmal Atrial Flutter with variable block and RVR History of SVT --Brief / improved tachypalpitations with diltiazem 24070m Continue..  --Continue on Eliquis 5 mg twice daily given CHA2DS2VASc score of at least 5 (CHF, HTN, agex1, vascular, female).  She denies any signs or symptoms of bleeding. Consider Xarelto to encourage compliance. Given she is in the donut hole, we also discussed transition to Warfarin. She prefers to defer for now. We discussed that [place] her at risk for stroke and pulmonary embolism. Given her asymmetric edema and recent SOB, confirmed that it is infrequent lately that she will miss an evening dose of Eliquis and that she has not interrupted therapy lately. She continues to decline sleep study, EP referral.  We discussed her volume overload today and relationship between Afib and volume overload. She declines updating an echo and diuresis.. No medication changes or further work-up at this time per pt preference.  AOC Diastolic dysfunction --SOB/breathing worse from previous visits with LEE noted on exam and pt appears volume up. Suspect exacerbation of volume status with elevated rates as above. She declines diuresis and updating an echo. Continue diltiazem, ARB. Salt and fluid restrictions reviewed.   Mobitz I (Wenckebach) Possible sleep apnea --Declines a sleep study. She is being seen by pulmonology.  Aortic stenosis, mild to moderate --Worsening DOE. Most recent echo as above with mild to moderate AS and abnormal valve. Harsh murmur on  exam. Dizziness with standing resolved. She declines repeat echo +/- referral to the structural heart team at this time.    Coronary artery disease involving the native coronary arteries on CT --No recent or current CP but increased DOE. Recent 04/03/20 CT with coronary and aortic atherosclerosis.  She does have significant risk factors for coronary artery disease, including current smoker. Further ischemic workup declined including MPI and catheterization, as well as echo. Continue atorvastatin 56m38m and Eliquis in lieu of ASA. Risk factor modification discussed / lifestyle changes. Repeat labs at PCP. Smoking cessation encouraged.   HLD, goal LDL below 70 --Continue high intensity statin with Lipitor 56mg62mly.  LDL goal <70. Continue Eliquis in lieu of ASA. Lifestyle changes discussed, including smoking cessation.    Defer to PCP regarding annual monitoring of lipids. She declined repeat lipids today.    Essential hypertension, poorly controlled (no medications taken today) --BP elevated, likely due to volume status as volume up on exam. Reviewed fluid and salt restrictions. Lifestyle changes discussed.  Continue current medications. She declines a diuretic today. She continues on Losartan 50mg 70my (medication list updated to reflect as Losartan was on the list twice as both 25mg a16m0mg). 52mscending aortic aneurysm, 4.2cm --Previous 05/18/19 CT scan noted 4.3 cm ascending aortic aneurysm.  04/2020 CT of chest for lung cancer screening measured thoracic aorta at 4.1cm  with recommendation for close monitoring during repeat scan within 3 months. Most recent scan shows aneurysm at 4.2cm with repeat scan recommended in 6 months. Recommend HR and BP support. Recommend improved volume status as above. Continue Lipitor 35m daily with Eliquis in place of ASA. Smoking cessation. Avoid heavy lifting and FQ. Follow with [facility] as scheduled.   Carotid artery disease s/p R carotid endarterectomy --Followed  by [facility]. Most recent scan with 1-39%s of L carotid.  No reported amaurosis fugax. Smoking cessation, as well as BP / heart rate control.  Continue Lipitor 822m Continue Eliquis in place of ASA.  Risk factor modification recommended.   Current smoker (1.5 packs / day) Pulmonary nodules --Worsening DOE. CT scan with tiny bilateral pulmonary nodules and development of new 7.60m19module of medial left upper lobe, as well as new irregular left lower lobe nodule measuring 6.3mm43mung -RADS 4A with recommendation for follow-up low dose chest CT without contrast in 3 months. PET scan was also noted to be considered. Smoking cessation advised.  She was prescribed Chantix; however, she prefers to avoid this medication on reading about it further. She is still smoking at this time and does not feel she will be able to quit smoking moving forward.  PVD --Reports claudication sx. Pt preference is to continue to defer scans at this time - she also deferred LE studies when seen by vascular. Continue risk factor modification with high intensity statin. She will call the office if any concerning or worsening sx.   Medication changes: Declined diuresis, additional medications for BP support.  Updated med list to include increased dose Losartan.  Labs ordered: None. Lipids per PCP. Studies / Imaging ordered: None. Declined echo/MPI/cath/EP referral/sleep study/LE studies.  Future considerations: Reassess declined medications and studies. As recommended, she should update her AAA [place] months. Repeat carotids. Disposition: RTC 3 months   JacqArvil Chaco-C 11/24/2020

## 2020-11-27 ENCOUNTER — Other Ambulatory Visit: Payer: Self-pay

## 2020-11-27 ENCOUNTER — Encounter: Payer: Self-pay | Admitting: Family

## 2020-11-27 ENCOUNTER — Ambulatory Visit (INDEPENDENT_AMBULATORY_CARE_PROVIDER_SITE_OTHER): Payer: Medicare Other | Admitting: Family

## 2020-11-27 ENCOUNTER — Ambulatory Visit (INDEPENDENT_AMBULATORY_CARE_PROVIDER_SITE_OTHER): Payer: Medicare Other | Admitting: Pharmacist

## 2020-11-27 DIAGNOSIS — I471 Supraventricular tachycardia: Secondary | ICD-10-CM | POA: Diagnosis not present

## 2020-11-27 DIAGNOSIS — R002 Palpitations: Secondary | ICD-10-CM

## 2020-11-27 DIAGNOSIS — I714 Abdominal aortic aneurysm, without rupture, unspecified: Secondary | ICD-10-CM

## 2020-11-27 DIAGNOSIS — E7800 Pure hypercholesterolemia, unspecified: Secondary | ICD-10-CM | POA: Diagnosis not present

## 2020-11-27 DIAGNOSIS — I700 Atherosclerosis of aorta: Secondary | ICD-10-CM | POA: Diagnosis not present

## 2020-11-27 DIAGNOSIS — J449 Chronic obstructive pulmonary disease, unspecified: Secondary | ICD-10-CM

## 2020-11-27 DIAGNOSIS — I10 Essential (primary) hypertension: Secondary | ICD-10-CM | POA: Diagnosis not present

## 2020-11-27 NOTE — Patient Instructions (Signed)
Visit Information   PATIENT GOALS:   Goals Addressed               This Visit's Progress     Patient Stated     Medication Access (pt-stated)        Patient Goals/Self-Care Activities Over the next 90 days, patient will:  - take medications as prescribed collaborate with provider on medication access solutions        Consent to CCM Services: Ms. Lavin was given information about Chronic Care Management services today including:  CCM service includes personalized support from designated clinical staff supervised by her physician, including individualized plan of care and coordination with other care providers 24/7 contact phone numbers for assistance for urgent and routine care needs. Service will only be billed when office clinical staff spend 20 minutes or more in a month to coordinate care. Only one practitioner may furnish and bill the service in a calendar month. The patient may stop CCM services at any time (effective at the end of the month) by phone call to the office staff. The patient will be responsible for cost sharing (co-pay) of up to 20% of the service fee (after annual deductible is met).  Patient agreed to services and verbal consent obtained.   Patient verbalizes understanding of instructions provided today and agrees to view in [place].    Plan: Telephone follow up appointment with care management team member scheduled for:  ~6 weeks  Catie Darnelle Maffucci, PharmD, [place], CPP Clinical Pharmacist [place] [facility] [place]: Patient Care Plan: Medication Management     Problem Identified: COPD, Afib, CHF      Long-Range Goal: Disease Progression Prevention   Start Date: 11/27/2020  Priority: High  Note:   Current Barriers:  Unable to independently afford treatment regimen  Pharmacist Clinical Goal(s):  Over the next 90 days, patient will verbalize ability to afford treatment regimen through collaboration  with PharmD and provider.   Interventions: 1:1 collaboration with Burnard Hawthorne, FNP regarding development and update of comprehensive plan of care as evidenced by provider attestation and co-signature Inter-disciplinary care team collaboration (see longitudinal plan of care) Comprehensive medication review performed; medication list updated in electronic medical record  Atrial Fibrillation, HF, HTN: Improved; current rate control: diltiazem 240 mg daily; anticoagulant treatment: Eliquis 5 mg BID Additional antihypertensive: losartan 50 mg  Reported cost concerns to [place], [place] at cardiology office prompting medication nonadherence.  Consider alternative ARB moving forward for improved BP control.  Discussed patient assistance for Eliquis. Patient meets income criteria and may meet out of pocket spend. Assisted in completing patient portion of application today, she will return with proof of income and proof of out of pocket spend. Will collaborate w/ CPhT to have cardiology provider sign.  Chronic Obstructive Pulmonary Disease: Control limited by affordability; current treatment: Stiolto 2.5/2.5 mcg 2 puffs daily;  Most recent Pulmonary Function Testing: 09/06/2019-pulmonary function test-FVC 1.85 (57% predicted), postbronchodilator ratio 62, postbronchodilator FEV1 1.27 (52% predicted), no bronchodilator response Counseled on appropriate administration.  Discussed patient assistance for Stiolto. Patient meets income criteria. Assisted in completing patient portion of application today, she will return with proof of income and proof of out of pocket spend. Will collaborate w/ CPhT to have pulmonary provider sign. Will discuss tobacco cessation moving forward.   Hyperlipidemia: Uncontrolled; current treatment: atorvastatin 80 mg;  Moving forward, consider addition of ezetimibe to target goal LDL <70  Supplements: Vitamin D, multivitamin   Patient Goals/Self-Care  Activities Over the  next 90 days, patient will:  - take medications as prescribed collaborate with provider on medication access solutions  Follow Up Plan: Telephone follow up appointment with care management team member scheduled for: 6 weeks

## 2020-11-27 NOTE — Chronic Care Management (AMB) (Signed)
Chronic Care Management Pharmacy Note  11/27/2020 Name:  Tracy Bell MRN:  [id number] DOB:  [birth year]  Subjective: Tracy Bell is an 70 y.o. year old female who is a primary patient of Burnard Hawthorne, FNP.  The CCM team was consulted for assistance with disease management and care coordination needs.    Engaged with patient face to face for initial visit in response to provider referral for pharmacy case management and/or care coordination services.   Consent to Services:  The patient was given the following information about Chronic Care Management services today, agreed to services, and gave verbal consent: 1. CCM service includes personalized support from designated clinical staff supervised by the primary care provider, including individualized plan of care and coordination with other care providers 2. 24/7 contact phone numbers for assistance for urgent and routine care needs. 3. Service will only be billed when office clinical staff spend 20 minutes or more in a month to coordinate care. 4. Only one practitioner may furnish and bill the service in a calendar month. 5.The patient may stop CCM services at any time (effective at the end of the month) by phone call to the office staff. 6. The patient will be responsible for cost sharing (co-pay) of up to 20% of the service fee (after annual deductible is met). Patient agreed to services and consent obtained.  Patient Care Team: Burnard Hawthorne, FNP as PCP - General (Family Medicine) Wellington Hampshire, MD as PCP - Cardiology (Cardiology) Arvil Chaco, PA-C as Physician Assistant (Cardiology) De Hollingshead, RPH-CPP as Pharmacist (Pharmacist)  Objective:  Lab Results  Component Value Date   CREATININE 0.97 09/09/2020   CREATININE 0.84 06/01/2020   CREATININE 0.94 05/07/2020    Lab Results  Component Value Date   HGBA1C 5.8 09/09/2020   Last diabetic Eye exam: No results found for: HMDIABEYEEXA  Last  diabetic Foot exam: No results found for: HMDIABFOOTEX      Component Value Date/Time   CHOL 160 09/09/2020 1114   TRIG 111.0 09/09/2020 1114   HDL 51.10 09/09/2020 1114   CHOLHDL 3 09/09/2020 1114   VLDL 22.2 09/09/2020 1114   LDLCALC 86 09/09/2020 1114    Hepatic Function Latest Ref Rng & Units 05/15/2019 03/23/2018 11/05/2015  Total Protein 6.0 - 8.3 g/dL 7.2 7.1 7.0  Albumin 3.5 - 5.2 g/dL 4.5 4.4 4.4  AST 0 - 37 U/L '21 18 19  ' ALT 0 - 35 U/L '19 16 18  ' Alk Phosphatase 39 - 117 U/L 74 69 72  Total Bilirubin 0.2 - 1.2 mg/dL 0.8 0.5 1.0    Lab Results  Component Value Date/Time   TSH 6.38 (H) 10/19/2020 10:46 AM   TSH 4.60 (H) 09/09/2020 11:14 AM   FREET4 0.68 10/19/2020 10:46 AM   FREET4 0.79 03/14/2014 09:50 AM    CBC Latest Ref Rng & Units 01/17/2020 12/23/2019 07/27/2018  WBC 4.0 - 10.5 K/uL 8.1 9.1 11.3(H)  Hemoglobin 12.0 - 15.0 g/dL 15.2(H) 16.2(H) 15.2(H)  Hematocrit 36.0 - 46.0 % 44.7 47.1(H) 44.6  Platelets 150.0 - 400.0 K/uL 210.0 219 279.0    Lab Results  Component Value Date/Time   VD25OH 16.32 (L) 03/23/2018 10:10 AM   VD25OH 19.92 (L) 03/14/2014 09:50 AM    Clinical ASCVD: No  The 10-year ASCVD risk score Mikey Bussing DC Jr., et al., 2013) is: 25.6%   Values used to calculate the score:     Age: 84 years     Sex:  Female     Is Non-Hispanic African American: No     Diabetic: No     Tobacco smoker: Yes     Systolic Blood Pressure: 545 mmHg     Is BP treated: Yes     HDL Cholesterol: 51.1 mg/dL     Total Cholesterol: 160 mg/dL    Social History   Tobacco Use  Smoking Status Every Day   Packs/day: 2.00   Years: 60.00   Pack years: 120.00   Types: Cigarettes   Start date: 05/03/1963  Smokeless Tobacco Never  Tobacco Comments   1.5PPD 06/18/2020   BP Readings from Last 3 Encounters:  11/27/20 138/82  11/24/20 (!) 140/94  10/27/20 (!) 162/82   Pulse Readings from Last 3 Encounters:  11/27/20 (!) 59  11/24/20 73  10/27/20 76   Wt Readings from Last  3 Encounters:  11/27/20 162 lb 6.4 oz (73.7 kg)  11/24/20 161 lb (73 kg)  10/27/20 156 lb (70.8 kg)    Assessment: Review of patient past medical history, allergies, medications, health status, including review of consultants reports, laboratory and other test data, was performed as part of comprehensive evaluation and provision of chronic care management services.   SDOH:  (Social Determinants of Health) assessments and interventions performed:  SDOH Interventions    Flowsheet Row Most Recent Value  SDOH Interventions   Financial Strain Interventions Other (Comment)  [manufacturer assistance]       CCM Care Plan  No Known Allergies  Medications Reviewed Today     Reviewed by De Hollingshead, RPH-CPP (Pharmacist) on 11/27/20 at 1204  Med List Status: <None>   Medication Order Taking? Sig Documenting Provider Last Dose Status Informant  apixaban (ELIQUIS) 5 MG TABS tablet [id number] Yes Take 1 tablet (5 mg total) by mouth 2 (two) times daily. [name] D, PA-C Taking Active   atorvastatin (LIPITOR) 80 MG tablet [id number] Yes Take 1 tablet (80 mg total) by mouth daily. [name] D, PA-C Taking Active   cholecalciferol (VITAMIN D3) 25 MCG (1000 UT) tablet [id number] Yes Take 1,000 Units by mouth daily. [provider] Taking Active   diltiazem (CARDIZEM CD) 240 MG 24 hr capsule [id number] Yes Take 1 capsule (240 mg total) by mouth daily. [name] D, PA-C Taking Active   losartan (COZAAR) 50 MG tablet [id number] Yes Take 1 tablet (50 mg total) by mouth daily. Burnard Hawthorne, FNP Taking Active   Multiple Vitamins-Minerals (CENTRUM ADULTS) TABS [id number] Yes Take 1 tablet by mouth daily. Jackolyn Confer, MD Taking Active   Tiotropium Bromide-Olodaterol (STIOLTO RESPIMAT) 2.5-2.5 MCG/ACT AERS [id number] Yes Inhale 2 puffs into the lungs daily. Lauraine Rinne, NP Taking Active             Patient Active Problem List   Diagnosis Date Noted    Pain in limb 10/28/2020   Abnormal findings on diagnostic imaging of lung 06/18/2020   Leg pain, lateral, left 06/12/2020   Healthcare maintenance 01/13/2020   Do not resuscitate discussion 12/03/2019   Lymphadenopathy 10/14/2019   Urinary incontinence 10/14/2019   SVT (supraventricular tachycardia) ([place]) 06/21/2019   Palpitations 07/27/2018   Carotid stenosis, right 07/12/2018   Carotid stenosis, bilateral 05/10/2018   Premature atrial contractions 05/01/2018   Erythrocytosis 04/14/2018   Left carotid bruit 04/10/2018   Thoracic aortic aneurysm without rupture ([place]) 04/10/2018   SOB (shortness of breath) on exertion 04/05/2018   Depression, recurrent ([place]) 03/23/2018   Dizziness 03/23/2018   Abdominal aneurysm ([place])  03/23/2018   Chest pain with high risk for cardiac etiology 12/15/2015   Stage 2 moderate COPD by GOLD classification ([place]) 12/15/2015   Nodule of left lung 11/17/2015   Hypercholesteremia 11/17/2015   Coronary atherosclerosis 11/05/2015   Personal history of tobacco use, presenting hazards to health 08/28/2015   Adjustment disorder with mixed anxiety and depressed mood 09/30/2014   Essential hypertension 05/22/2014   Atherosclerosis of aorta ([place]) 04/17/2014   Aortic stenosis, mild 04/17/2014   Tobacco use disorder 03/14/2014   Special screening for malignant neoplasms, colon 03/14/2014   Murmur 03/14/2014   Dysphagia 03/14/2014   Tobacco dependence syndrome 03/14/2014    Immunization History  Administered Date(s) Administered   Fluad Quad(high Dose 65+) 01/17/2020   PFIZER Comirnaty(Gray Top)Covid-19 Tri-Sucrose Vaccine 07/03/2020   PFIZER(Purple Top)SARS-COV-2 Vaccination 08/01/2019, 08/28/2019   Pneumococcal Polysaccharide-23 07/13/2018   Tdap 12/02/2014    Conditions to be addressed/monitored: Atrial Fibrillation, HTN, HLD, and COPD  Care Plan : Medication Management  Updates made by De Hollingshead, RPH-CPP since 11/27/2020 12:00 AM      Problem: COPD, Afib, CHF      Long-Range Goal: Disease Progression Prevention   Start Date: 11/27/2020  Priority: High  Note:   Current Barriers:  Unable to independently afford treatment regimen  Pharmacist Clinical Goal(s):  Over the next 90 days, patient will verbalize ability to afford treatment regimen through collaboration with PharmD and provider.   Interventions: 1:1 collaboration with Burnard Hawthorne, FNP regarding development and update of comprehensive plan of care as evidenced by provider attestation and co-signature Inter-disciplinary care team collaboration (see longitudinal plan of care) Comprehensive medication review performed; medication list updated in electronic medical record  Atrial Fibrillation, HF, HTN: Improved; current rate control: diltiazem 240 mg daily; anticoagulant treatment: Eliquis 5 mg BID Additional antihypertensive: losartan 50 mg  Reported cost concerns to [place], [place] at cardiology office prompting medication nonadherence.  Consider alternative ARB moving forward for improved BP control.  Discussed patient assistance for Eliquis. Patient meets income criteria and may meet out of pocket spend. Assisted in completing patient portion of application today, she will return with proof of income and proof of out of pocket spend. Will collaborate w/ CPhT to have cardiology provider sign.  Chronic Obstructive Pulmonary Disease: Control limited by affordability; current treatment: Stiolto 2.5/2.5 mcg 2 puffs daily;  Most recent Pulmonary Function Testing: 09/06/2019-pulmonary function test-FVC 1.85 (57% predicted), postbronchodilator ratio 62, postbronchodilator FEV1 1.27 (52% predicted), no bronchodilator response Counseled on appropriate administration.  Discussed patient assistance for Stiolto. Patient meets income criteria. Assisted in completing patient portion of application today, she will return with proof of income and proof of out of pocket spend.  Will collaborate w/ CPhT to have pulmonary provider sign. Will discuss tobacco cessation moving forward.   Hyperlipidemia: Uncontrolled; current treatment: atorvastatin 80 mg;  Moving forward, consider addition of ezetimibe to target goal LDL <70  Supplements: Vitamin D, multivitamin   Patient Goals/Self-Care Activities Over the next 90 days, patient will:  - take medications as prescribed collaborate with provider on medication access solutions  Follow Up Plan: Telephone follow up appointment with care management team member scheduled for: 6 weeks      Medication Assistance: Application for Stiolto, Eliquis  medication assistance program. in process.  Anticipated assistance start date TBD.  See plan of care for additional detail.  Patient's preferred pharmacy is:  [name] [id number]:  [phone] Fax: [phone]  Uses pill box? Yes  Follow Up:  Patient agrees to Care Plan and Follow-up.  Plan: Telephone follow up appointment with care management team member scheduled for:  ~6 weeks  Catie Darnelle Maffucci, PharmD, [place], [place] Clinical Pharmacist [employer] at [employer] [phone]

## 2020-11-27 NOTE — Progress Notes (Signed)
Subjective:    Patient ID: Tracy Bell, female    DOB: [birth year], 70 y.o.   MRN: [id number]  CC: [name] is a 70 y.o. female who presents today for follow up.   HPI: She feels well today.  No new health complaints.  Pharm.D., Alphonzo Severance, accompanied me on this visit.  She is concerned with increased cost of Eliquis.  Discussed with cardiology whether she should transition to [place] or coumadin. Patient deferred.  Patient also declined sleep study and EP referral.  BP at home range from 117/69, 131/68, 126/75, 111/73, 104/79,   HR 68-136.  She continues to have episodic elevations in her heart rate which feel very similar to prior episodes.  These are self-limiting and resolve after couple minutes.  No syncope, chest pain or shortness of breath.  She would like to donate her body to [facility] for research and has document for to sign and witness her signature.     HTN- compliant with  losartan '50mg'$ . No cp   CAD- compliant with atorvastatin '80mg'$  qd and Eliquis  3 days ago patient had follow-up with cardiology, Mickle Plumb, [place].   History of SVT-compliant with diltiazem '240mg'$ .  Moderate aortic stenosis  Following with vascular abdominal aortic aneurysm, carotid stenosis, appt with Dr Lucky Cowboy. Due to increase of AAA, he will now follow in 6 month interval , due in 04/2021.  Pulmonology- following with Wyn Quaker.  Overdue for an appointment  HISTORY:  Past Medical History:  Diagnosis Date   Anxiety    COPD (chronic obstructive pulmonary disease) (HCC)    Cough    Depression    Dizziness    Dyspnea    Heart murmur    Hyperlipidemia    Hypertension    Lightheadedness    Personal history of tobacco use, presenting hazards to health 08/28/2015   Past Surgical History:  Procedure Laterality Date   ABDOMINAL HYSTERECTOMY  1980   complete   BREAST BIOPSY  1970   normal   DILATION AND CURETTAGE OF UTERUS     ENDARTERECTOMY Right 07/12/2018   Procedure: ENDARTERECTOMY  CAROTID;  Surgeon: Algernon Huxley, MD;  Location: [facility] ORS;  Service: Vascular;  Laterality: Right;   TUBAL LIGATION     Family History  Problem Relation Age of Onset   Cancer Mother        Pancreatic Cancer    Cancer Father        Stomach Cancer    Lung cancer Sister    Cancer Sister        ovarian   Lung cancer Sister     Allergies: Patient has no known allergies. Current Outpatient Medications on File Prior to Visit  Medication Sig Dispense Refill   apixaban (ELIQUIS) 5 MG TABS tablet Take 1 tablet (5 mg total) by mouth 2 (two) times daily. 60 tablet 11   atorvastatin (LIPITOR) 80 MG tablet Take 1 tablet (80 mg total) by mouth daily. 90 tablet 3   cholecalciferol (VITAMIN D3) 25 MCG (1000 UT) tablet Take 1,000 Units by mouth daily.     diltiazem (CARDIZEM CD) 240 MG 24 hr capsule Take 1 capsule (240 mg total) by mouth daily. 90 capsule 0   losartan (COZAAR) 50 MG tablet Take 1 tablet (50 mg total) by mouth daily. 90 tablet 1   Multiple Vitamins-Minerals (CENTRUM ADULTS) TABS Take 1 tablet by mouth daily. 30 tablet 6   Tiotropium Bromide-Olodaterol (STIOLTO RESPIMAT) 2.5-2.5 MCG/ACT AERS Inhale 2 puffs into  the lungs daily. 4 g 0   No current facility-administered medications on file prior to visit.    Social History   Tobacco Use   Smoking status: Every Day    Packs/day: 2.00    Years: 60.00    Pack years: 120.00    Types: Cigarettes    Start date: 05/03/1963   Smokeless tobacco: Never   Tobacco comments:    1.5PPD 06/18/2020  Vaping Use   Vaping Use: Never used  Substance Use Topics   Alcohol use: Yes    Alcohol/week: 0.0 standard drinks    Comment: [place] occasionally   Drug use: No    Review of Systems  Constitutional:  Negative for chills and fever.  Respiratory:  Negative for cough.   Cardiovascular:  Positive for palpitations. Negative for chest pain and leg swelling.  Gastrointestinal:  Negative for nausea and vomiting.     Objective:    BP  138/82 (BP Location: Left Arm, Patient Position: Sitting, Cuff Size: Large)   Pulse (!) 59   Temp 98.7 F (37.1 C) (Oral)   Ht '5\' 6"'$  (1.676 m)   Wt 162 lb 6.4 oz (73.7 kg)   SpO2 95%   BMI 26.21 kg/m  BP Readings from Last 3 Encounters:  11/27/20 138/82  11/24/20 (!) 140/94  10/27/20 (!) 162/82   Wt Readings from Last 3 Encounters:  11/27/20 162 lb 6.4 oz (73.7 kg)  11/24/20 161 lb (73 kg)  10/27/20 156 lb (70.8 kg)    Physical Exam Vitals reviewed.  Constitutional:      Appearance: She is well-developed.  Eyes:     Conjunctiva/sclera: Conjunctivae normal.  Cardiovascular:     Rate and Rhythm: Normal rate and regular rhythm.     Pulses: Normal pulses.     Heart sounds: Normal heart sounds.  Pulmonary:     Effort: Pulmonary effort is normal.     Breath sounds: Normal breath sounds. No wheezing, rhonchi or rales.  Skin:    General: Skin is warm and dry.  Neurological:     Mental Status: She is alert.  Psychiatric:        Speech: Speech normal.        Behavior: Behavior normal.        Thought Content: Thought content normal.       Assessment & Plan:   Problem List Items Addressed This Visit       Cardiovascular and Mediastinum   Abdominal aneurysm (HCC)    Chronic, stable. Increased in size. Dr Lucky Cowboy following every 6 months  , due in 04/2021. Will follow       Atherosclerosis of aorta (HCC)    Chronic, stable.  Continue atorvastatin 80 mg, eliquis '5mg'$  bid.  Recheck lipid panel at follow-up and consider Zetia adjunct for LDL to be less than '70mg'$ . Medication assistance provided from Catie, pharmD and to be sent/signed by cardiology.       Essential hypertension    Chronic , controlled. Reviewed log of home blood pressure readings which on average less than 130/80.  Continue losartan 50 mg at this time.       SVT (supraventricular tachycardia) (HCC)    Chronic stable. Continue diltiazem '240mg'$ .         Of note: I have signed form as witness for [employer]  body donation.    I am having Tracy Bell maintain her Centrum Adults, cholecalciferol, apixaban, atorvastatin, losartan, Stiolto Respimat, and diltiazem.   No orders of the  defined types were placed in this encounter.   Return precautions given.   Risks, benefits, and alternatives of the medications and treatment plan prescribed today were discussed, and patient expressed understanding.   Education regarding symptom management and diagnosis given to patient on AVS.  Continue to follow with Burnard Hawthorne, FNP for routine health maintenance.   Krystall M Dougal and I agreed with plan.   [name], FNP

## 2020-11-27 NOTE — Patient Instructions (Addendum)
Call to make an appointment for repeat Lung cancer screen  With CT Chest: [phone]. This is due 12/2020  Please also call pulmonology to schedule a follow up   Let me know if any issues in doing so.

## 2020-12-01 ENCOUNTER — Other Ambulatory Visit: Payer: Self-pay | Admitting: *Deleted

## 2020-12-01 DIAGNOSIS — Z87891 Personal history of nicotine dependence: Secondary | ICD-10-CM

## 2020-12-01 DIAGNOSIS — F17210 Nicotine dependence, cigarettes, uncomplicated: Secondary | ICD-10-CM

## 2020-12-01 DIAGNOSIS — R911 Solitary pulmonary nodule: Secondary | ICD-10-CM

## 2020-12-01 NOTE — Assessment & Plan Note (Addendum)
Chronic , controlled. Reviewed log of home blood pressure readings which on average less than 130/80.  Continue losartan 50 mg at this time.

## 2020-12-01 NOTE — Progress Notes (Signed)
FYI all the lung cancers screenings are now done through [facility]. You can still leave a message at the number you provided me, but they refer you to [facility]. I called & spoke with Langley Gauss who helps get patient's set up for CT lung cancer screenings & she will have her scheduler call to get patient up. She said that she just hasn't been called yet bc they call one month in advance to get patient scheduled.

## 2020-12-01 NOTE — Assessment & Plan Note (Signed)
Chronic, stable. Increased in size. Dr Lucky Cowboy following every 6 months  , due in 04/2021. Will follow

## 2020-12-01 NOTE — Assessment & Plan Note (Addendum)
Chronic, stable.  Continue atorvastatin 80 mg, eliquis '5mg'$  bid.  Recheck lipid panel at follow-up and consider Zetia adjunct for LDL to be less than '70mg'$ . Medication assistance provided from Catie, pharmD and to be sent/signed by cardiology.

## 2020-12-01 NOTE — Assessment & Plan Note (Signed)
Chronic stable. Continue diltiazem '240mg'$ .

## 2020-12-02 ENCOUNTER — Telehealth: Payer: Self-pay

## 2020-12-02 ENCOUNTER — Telehealth: Payer: Self-pay | Admitting: Pharmacy Technician

## 2020-12-02 DIAGNOSIS — Z596 Low income: Secondary | ICD-10-CM

## 2020-12-02 NOTE — Telephone Encounter (Signed)
Incoming fax from Tarri Fuller , CPhT with [employer].   She needs the provider portion of a patient assistance application for Eliquis signed by [name], PA.   I have placed this on her desk.   She stated to please fax back ASAP to [phone] ATTN: Sharee Pimple

## 2020-12-02 NOTE — Progress Notes (Addendum)
[facility])                                            [place] Team    12/02/2020  Tracy Bell Jul 21, 1950 [id number]                                      Medication Assistance Referral  Referral From: [place] RPh [name]   Medication/Company: Eliquis / [employer] Patient application portion:  Mailed Provider application portion: Faxed  to [name], [facility] Provider address/fax verified via: Office website  Medication/Company: [name] / [employer] Patient application portion:  Mailed Provider application portion:  N/A Embedded PharmD to have provider sign in clinic  to [name], [place] Provider address/fax verified via: Office website  [name] [name], [place]  [phone]

## 2020-12-02 NOTE — Telephone Encounter (Signed)
Application has been signed by Malachi Bonds and faxed to the number provided.

## 2020-12-08 ENCOUNTER — Ambulatory Visit: Payer: Medicare Other | Admitting: Pharmacist

## 2020-12-08 DIAGNOSIS — I700 Atherosclerosis of aorta: Secondary | ICD-10-CM

## 2020-12-08 DIAGNOSIS — F17200 Nicotine dependence, unspecified, uncomplicated: Secondary | ICD-10-CM

## 2020-12-08 DIAGNOSIS — J449 Chronic obstructive pulmonary disease, unspecified: Secondary | ICD-10-CM

## 2020-12-08 DIAGNOSIS — R002 Palpitations: Secondary | ICD-10-CM

## 2020-12-08 DIAGNOSIS — I471 Supraventricular tachycardia: Secondary | ICD-10-CM

## 2020-12-08 NOTE — Chronic Care Management (AMB) (Signed)
Chronic Care Management Pharmacy Note  12/08/2020 Name:  Tracy Bell MRN:  [id number] DOB:  May 14, 1950  Subjective: Tracy Bell is an 70 y.o. year old female who is a primary patient of Burnard Hawthorne, FNP.  The CCM team was consulted for assistance with disease management and care coordination needs.    Engaged with patient by telephone for  medication access f/u  in response to provider referral for pharmacy case management and/or care coordination services.   Consent to Services:  The patient was given information about Chronic Care Management services, agreed to services, and gave verbal consent prior to initiation of services.  Please see initial visit note for detailed documentation.   Patient Care Team: Burnard Hawthorne, FNP as PCP - General (Family Medicine) Wellington Hampshire, MD as PCP - Cardiology (Cardiology) Arvil Chaco, PA-C as Physician Assistant (Cardiology) De Hollingshead, RPH-CPP as Pharmacist (Pharmacist)   Objective:  Lab Results  Component Value Date   CREATININE 0.97 09/09/2020   CREATININE 0.84 06/01/2020   CREATININE 0.94 05/07/2020    Lab Results  Component Value Date   HGBA1C 5.8 09/09/2020   Last diabetic Eye exam: No results found for: HMDIABEYEEXA  Last diabetic Foot exam: No results found for: HMDIABFOOTEX      Component Value Date/Time   CHOL 160 09/09/2020 1114   TRIG 111.0 09/09/2020 1114   HDL 51.10 09/09/2020 1114   CHOLHDL 3 09/09/2020 1114   VLDL 22.2 09/09/2020 1114   LDLCALC 86 09/09/2020 1114    Hepatic Function Latest Ref Rng & Units 05/15/2019 03/23/2018 11/05/2015  Total Protein 6.0 - 8.3 g/dL 7.2 7.1 7.0  Albumin 3.5 - 5.2 g/dL 4.5 4.4 4.4  AST 0 - 37 U/L _0 ALT 0 - 35 U/L _1 Alk Phosphatase 39 - 117 U/L 74 69 72  Total Bilirubin 0.2 - 1.2 mg/dL 0.8 0.5 1.0    Lab Results  Component Value Date/Time   TSH 6.38 (H) 10/19/2020 10:46 AM   TSH 4.60 (H) 09/09/2020 11:14 AM    FREET4 0.68 10/19/2020 10:46 AM   FREET4 0.79 03/14/2014 09:50 AM    CBC Latest Ref Rng & Units 01/17/2020 12/23/2019 07/27/2018  WBC 4.0 - 10.5 K/uL 8.1 9.1 11.3(H)  Hemoglobin 12.0 - 15.0 g/dL 15.2(H) 16.2(H) 15.2(H)  Hematocrit 36.0 - 46.0 % 44.7 47.1(H) 44.6  Platelets 150.0 - 400.0 K/uL 210.0 219 279.0    Lab Results  Component Value Date/Time   VD25OH 16.32 (L) 03/23/2018 10:10 AM   VD25OH 19.92 (L) 03/14/2014 09:50 AM    Clinical ASCVD: No  The 10-year ASCVD risk score Mikey Bussing DC Jr., et al., 2013) is: 25.6%   Values used to calculate the score:     Age: 36 years     Sex: Female     Is Non-Hispanic African American: No     Diabetic: No     Tobacco smoker: Yes     Systolic Blood Pressure: 967 mmHg     Is BP treated: Yes     HDL Cholesterol: 51.1 mg/dL     Total Cholesterol: 160 mg/dL     CHA2DS2-VASc Score = 5  This indicates a 7.2% annual risk of stroke. The patient's score is based upon: CHF History: Yes HTN History: Yes Diabetes History: No Stroke History: No Vascular Disease History: Yes Age Score: 1 Gender Score: 1   Social History   Tobacco Use  Smoking Status Every  Day   Packs/day: 2.00   Years: 60.00   Pack years: 120.00   Types: Cigarettes   Start date: 05/03/1963  Smokeless Tobacco Never  Tobacco Comments   1.5PPD 06/18/2020   BP Readings from Last 3 Encounters:  11/27/20 138/82  11/24/20 (!) 140/94  10/27/20 (!) 162/82   Pulse Readings from Last 3 Encounters:  11/27/20 (!) 59  11/24/20 73  10/27/20 76   Wt Readings from Last 3 Encounters:  11/27/20 162 lb 6.4 oz (73.7 kg)  11/24/20 161 lb (73 kg)  10/27/20 156 lb (70.8 kg)    Assessment: Review of patient past medical history, allergies, medications, health status, including review of consultants reports, laboratory and other test data, was performed as part of comprehensive evaluation and provision of chronic care management services.   SDOH:  (Social Determinants of Health)  assessments and interventions performed:  SDOH Interventions    Flowsheet Row Most Recent Value  SDOH Interventions   Financial Strain Interventions Other (Comment)  [manufacturer assistance]       CCM Care Plan  No Known Allergies  Medications Reviewed Today     Reviewed by De Hollingshead, RPH-CPP (Pharmacist) on 11/27/20 at 1204  Med List Status: <None>   Medication Order Taking? Sig Documenting Provider Last Dose Status Informant  apixaban (ELIQUIS) 5 MG TABS tablet [id number] Yes Take 1 tablet (5 mg total) by mouth 2 (two) times daily. [name] D, PA-C Taking Active   atorvastatin (LIPITOR) 80 MG tablet [id number] Yes Take 1 tablet (80 mg total) by mouth daily. [name] D, PA-C Taking Active   cholecalciferol (VITAMIN D3) 25 MCG (1000 UT) tablet [id number] Yes Take 1,000 Units by mouth daily. [provider] Taking Active   diltiazem (CARDIZEM CD) 240 MG 24 hr capsule [id number] Yes Take 1 capsule (240 mg total) by mouth daily. [name] D, PA-C Taking Active   losartan (COZAAR) 50 MG tablet [id number] Yes Take 1 tablet (50 mg total) by mouth daily. Burnard Hawthorne, FNP Taking Active   Multiple Vitamins-Minerals (CENTRUM ADULTS) TABS [id number] Yes Take 1 tablet by mouth daily. Jackolyn Confer, MD Taking Active   Tiotropium Bromide-Olodaterol (STIOLTO RESPIMAT) 2.5-2.5 MCG/ACT AERS [id number] Yes Inhale 2 puffs into the lungs daily. Lauraine Rinne, NP Taking Active             Patient Active Problem List   Diagnosis Date Noted   Pain in limb 10/28/2020   Abnormal findings on diagnostic imaging of lung 06/18/2020   Leg pain, lateral, left 06/12/2020   Healthcare maintenance 01/13/2020   Do not resuscitate discussion 12/03/2019   Lymphadenopathy 10/14/2019   Urinary incontinence 10/14/2019   SVT (supraventricular tachycardia) ([place]) 06/21/2019   Palpitations 07/27/2018   Carotid stenosis, right 07/12/2018   Carotid stenosis,  bilateral 05/10/2018   Premature atrial contractions 05/01/2018   Erythrocytosis 04/14/2018   Left carotid bruit 04/10/2018   Thoracic aortic aneurysm without rupture ([place]) 04/10/2018   SOB (shortness of breath) on exertion 04/05/2018   Depression, recurrent ([place]) 03/23/2018   Dizziness 03/23/2018   Abdominal aneurysm ([place]) 03/23/2018   Chest pain with high risk for cardiac etiology 12/15/2015   Stage 2 moderate COPD by GOLD classification ([place]) 12/15/2015   Nodule of left lung 11/17/2015   Hypercholesteremia 11/17/2015   Coronary atherosclerosis 11/05/2015   Personal history of tobacco use, presenting hazards to health 08/28/2015   Adjustment disorder with mixed anxiety and depressed mood 09/30/2014   Essential hypertension 05/22/2014  Atherosclerosis of aorta ([place]) 04/17/2014   Aortic stenosis, mild 04/17/2014   Tobacco use disorder 03/14/2014   Special screening for malignant neoplasms, colon 03/14/2014   Murmur 03/14/2014   Dysphagia 03/14/2014   Tobacco dependence syndrome 03/14/2014    Immunization History  Administered Date(s) Administered   Fluad Quad(high Dose 65+) 01/17/2020   PFIZER Comirnaty(Gray Top)Covid-19 Tri-Sucrose Vaccine 07/03/2020   PFIZER(Purple Top)SARS-COV-2 Vaccination 08/01/2019, 08/28/2019   Pneumococcal Polysaccharide-23 07/13/2018   Tdap 12/02/2014  .  Conditions to be addressed/monitored: Atrial Fibrillation, HTN, and HLD  Care Plan : Medication Management  Updates made by De Hollingshead, RPH-CPP since 12/08/2020 12:00 AM     Problem: COPD, Afib, CHF      Long-Range Goal: Disease Progression Prevention   Start Date: 11/27/2020  This Visit's Progress: On track  Priority: High  Note:   Current Barriers:  Unable to independently afford treatment regimen  Pharmacist Clinical Goal(s):  Over the next 90 days, patient will verbalize ability to afford treatment regimen through collaboration with PharmD and provider.    Interventions: 1:1 collaboration with Burnard Hawthorne, FNP regarding development and update of comprehensive plan of care as evidenced by provider attestation and co-signature Inter-disciplinary care team collaboration (see longitudinal plan of care) Comprehensive medication review performed; medication list updated in electronic medical record  Atrial Fibrillation, HF, HTN: Improved; current rate control: diltiazem 240 mg daily; anticoagulant treatment: Eliquis 5 mg BID Additional antihypertensive: losartan 50 mg  Contacted patient to f/u on collection of proof of income and out of pocket spend. She has not had a chance to collect yet, but will get to me when she can.   Chronic Obstructive Pulmonary Disease: Control limited by affordability; current treatment: Stiolto 2.5/2.5 mcg 2 puffs daily;  Most recent Pulmonary Function Testing: 09/06/2019-pulmonary function test-FVC 1.85 (57% predicted), postbronchodilator ratio 62, postbronchodilator FEV1 1.27 (52% predicted), no bronchodilator response Contacted patient to f/u on collection of proof of income and out of pocket spend. She has not had a chance to collect yet, but will get to me when she can.   Hyperlipidemia: Uncontrolled; current treatment: atorvastatin 80 mg;  Moving forward, consider addition of ezetimibe to target goal LDL <70  Supplements: Vitamin D, multivitamin   Patient Goals/Self-Care Activities Over the next 90 days, patient will:  - take medications as prescribed collaborate with provider on medication access solutions  Follow Up Plan: Telephone follow up appointment with care management team member scheduled for: 4 weeks as previously scheduled      Medication Assistance:  Patient assistance for Eliquis, Stiolto in process  [facility]  [name]: [phone]: [phone] Up:   Patient agrees to Care Plan and Follow-up.  Plan: Telephone follow up appointment with care management team member scheduled for:  ~ 4 weeks as previously scheduled  Catie TDarnelle Maffucci PharmD, BOffutt AFB CAtkinson MillsClinical Pharmacist LOccidental Petroleumat BJohnson [employer] [phone]

## 2020-12-08 NOTE — Patient Instructions (Signed)
Visit Information  PATIENT GOALS:  Goals Addressed               This Visit's Progress     Patient Stated     Medication Access (pt-stated)        Patient Goals/Self-Care Activities Over the next 90 days, patient will:  - take medications as prescribed collaborate with provider on medication access solutions         Patient verbalizes understanding of instructions provided today and agrees to view in [place].   Plan: Telephone follow up appointment with care management team member scheduled for:  ~ 4 weeks as previously scheduled  Catie Darnelle Maffucci, PharmD, [place], [place] Clinical Pharmacist [employer] at [employer] [phone]

## 2020-12-10 ENCOUNTER — Ambulatory Visit (INDEPENDENT_AMBULATORY_CARE_PROVIDER_SITE_OTHER): Payer: Medicare Other | Admitting: Pharmacist

## 2020-12-10 DIAGNOSIS — R002 Palpitations: Secondary | ICD-10-CM

## 2020-12-10 DIAGNOSIS — J449 Chronic obstructive pulmonary disease, unspecified: Secondary | ICD-10-CM | POA: Diagnosis not present

## 2020-12-10 DIAGNOSIS — I700 Atherosclerosis of aorta: Secondary | ICD-10-CM

## 2020-12-10 NOTE — Patient Instructions (Signed)
Visit Information  PATIENT GOALS:  Goals Addressed               This Visit's Progress     Patient Stated     Medication Access (pt-stated)        Patient Goals/Self-Care Activities Over the next 90 days, patient will:  - take medications as prescribed collaborate with provider on medication access solutions        Patient verbalizes understanding of instructions provided today and agrees to view in [place].   Plan: Telephone follow up appointment with care management team member scheduled for:  ~ 4 weeks as previously scheduled  Catie Darnelle Maffucci, PharmD, [place], [place] Clinical Pharmacist [employer] at [employer] [phone]

## 2020-12-10 NOTE — Chronic Care Management (AMB) (Signed)
Chronic Care Management Pharmacy Note  12/10/2020 Name:  Tracy Bell MRN:  [id number] DOB:  1951/03/31  Subjective: Tracy Bell is an 70 y.o. year old female who is a primary patient of Burnard Hawthorne, FNP.  The CCM team was consulted for assistance with disease management and care coordination needs.    Care coordination  for  medication access  in response to provider referral for pharmacy case management and/or care coordination services.   Consent to Services:  The patient was given information about Chronic Care Management services, agreed to services, and gave verbal consent prior to initiation of services.  Please see initial visit note for detailed documentation.   Patient Care Team: Burnard Hawthorne, FNP as PCP - General (Family Medicine) Wellington Hampshire, MD as PCP - Cardiology (Cardiology) Arvil Chaco, PA-C as Physician Assistant (Cardiology) De Hollingshead, RPH-CPP as Pharmacist (Pharmacist)   Objective:  Lab Results  Component Value Date   CREATININE 0.97 09/09/2020   CREATININE 0.84 06/01/2020   CREATININE 0.94 05/07/2020    Lab Results  Component Value Date   HGBA1C 5.8 09/09/2020   Last diabetic Eye exam: No results found for: HMDIABEYEEXA  Last diabetic Foot exam: No results found for: HMDIABFOOTEX      Component Value Date/Time   CHOL 160 09/09/2020 1114   TRIG 111.0 09/09/2020 1114   HDL 51.10 09/09/2020 1114   CHOLHDL 3 09/09/2020 1114   VLDL 22.2 09/09/2020 1114   LDLCALC 86 09/09/2020 1114    Hepatic Function Latest Ref Rng & Units 05/15/2019 03/23/2018 11/05/2015  Total Protein 6.0 - 8.3 g/dL 7.2 7.1 7.0  Albumin 3.5 - 5.2 g/dL 4.5 4.4 4.4  AST 0 - 37 U/L '21 18 19  ' ALT 0 - 35 U/L '19 16 18  ' Alk Phosphatase 39 - 117 U/L 74 69 72  Total Bilirubin 0.2 - 1.2 mg/dL 0.8 0.5 1.0    Lab Results  Component Value Date/Time   TSH 6.38 (H) 10/19/2020 10:46 AM   TSH 4.60 (H) 09/09/2020 11:14 AM   FREET4 0.68 10/19/2020  10:46 AM   FREET4 0.79 03/14/2014 09:50 AM    CBC Latest Ref Rng & Units 01/17/2020 12/23/2019 07/27/2018  WBC 4.0 - 10.5 K/uL 8.1 9.1 11.3(H)  Hemoglobin 12.0 - 15.0 g/dL 15.2(H) 16.2(H) 15.2(H)  Hematocrit 36.0 - 46.0 % 44.7 47.1(H) 44.6  Platelets 150.0 - 400.0 K/uL 210.0 219 279.0    Lab Results  Component Value Date/Time   VD25OH 16.32 (L) 03/23/2018 10:10 AM   VD25OH 19.92 (L) 03/14/2014 09:50 AM    Clinical ASCVD: Yes  The 10-year ASCVD risk score Mikey Bussing DC Jr., et al., 2013) is: 25.6%   Values used to calculate the score:     Age: 21 years     Sex: Female     Is Non-Hispanic African American: No     Diabetic: No     Tobacco smoker: Yes     Systolic Blood Pressure: 153 mmHg     Is BP treated: Yes     HDL Cholesterol: 51.1 mg/dL     Total Cholesterol: 160 mg/dL     Social History   Tobacco Use  Smoking Status Every Day   Packs/day: 2.00   Years: 60.00   Pack years: 120.00   Types: Cigarettes   Start date: 05/03/1963  Smokeless Tobacco Never  Tobacco Comments   1.5PPD 06/18/2020   BP Readings from Last 3 Encounters:  11/27/20 138/82  11/24/20 (!) 140/94  10/27/20 (!) 162/82   Pulse Readings from Last 3 Encounters:  11/27/20 (!) 59  11/24/20 73  10/27/20 76   Wt Readings from Last 3 Encounters:  11/27/20 162 lb 6.4 oz (73.7 kg)  11/24/20 161 lb (73 kg)  10/27/20 156 lb (70.8 kg)    Assessment: Review of patient past medical history, allergies, medications, health status, including review of consultants reports, laboratory and other test data, was performed as part of comprehensive evaluation and provision of chronic care management services.   SDOH:  (Social Determinants of Health) assessments and interventions performed:  SDOH Interventions    Flowsheet Row Most Recent Value  SDOH Interventions   Financial Strain Interventions Other (Comment)  [manufacturer assistance]       CCM Care Plan  No Known Allergies  Medications Reviewed Today      Reviewed by De Hollingshead, RPH-CPP (Pharmacist) on 11/27/20 at 1204  Med List Status: <None>   Medication Order Taking? Sig Documenting Provider Last Dose Status Informant  apixaban (ELIQUIS) 5 MG TABS tablet [id number] Yes Take 1 tablet (5 mg total) by mouth 2 (two) times daily. [name] D, PA-C Taking Active   atorvastatin (LIPITOR) 80 MG tablet [id number] Yes Take 1 tablet (80 mg total) by mouth daily. [name] D, PA-C Taking Active   cholecalciferol (VITAMIN D3) 25 MCG (1000 UT) tablet [id number] Yes Take 1,000 Units by mouth daily. [provider] Taking Active   diltiazem (CARDIZEM CD) 240 MG 24 hr capsule [id number] Yes Take 1 capsule (240 mg total) by mouth daily. [name] D, PA-C Taking Active   losartan (COZAAR) 50 MG tablet [id number] Yes Take 1 tablet (50 mg total) by mouth daily. Burnard Hawthorne, FNP Taking Active   Multiple Vitamins-Minerals (CENTRUM ADULTS) TABS [id number] Yes Take 1 tablet by mouth daily. Jackolyn Confer, MD Taking Active   Tiotropium Bromide-Olodaterol (STIOLTO RESPIMAT) 2.5-2.5 MCG/ACT AERS [id number] Yes Inhale 2 puffs into the lungs daily. Lauraine Rinne, NP Taking Active             Patient Active Problem List   Diagnosis Date Noted   Pain in limb 10/28/2020   Abnormal findings on diagnostic imaging of lung 06/18/2020   Leg pain, lateral, left 06/12/2020   Healthcare maintenance 01/13/2020   Do not resuscitate discussion 12/03/2019   Lymphadenopathy 10/14/2019   Urinary incontinence 10/14/2019   SVT (supraventricular tachycardia) ([place]) 06/21/2019   Palpitations 07/27/2018   Carotid stenosis, right 07/12/2018   Carotid stenosis, bilateral 05/10/2018   Premature atrial contractions 05/01/2018   Erythrocytosis 04/14/2018   Left carotid bruit 04/10/2018   Thoracic aortic aneurysm without rupture ([place]) 04/10/2018   SOB (shortness of breath) on exertion 04/05/2018   Depression, recurrent ([place]) 03/23/2018    Dizziness 03/23/2018   Abdominal aneurysm ([place]) 03/23/2018   Chest pain with high risk for cardiac etiology 12/15/2015   Stage 2 moderate COPD by GOLD classification ([place]) 12/15/2015   Nodule of left lung 11/17/2015   Hypercholesteremia 11/17/2015   Coronary atherosclerosis 11/05/2015   Personal history of tobacco use, presenting hazards to health 08/28/2015   Adjustment disorder with mixed anxiety and depressed mood 09/30/2014   Essential hypertension 05/22/2014   Atherosclerosis of aorta ([place]) 04/17/2014   Aortic stenosis, mild 04/17/2014   Tobacco use disorder 03/14/2014   Special screening for malignant neoplasms, colon 03/14/2014   Murmur 03/14/2014   Dysphagia 03/14/2014   Tobacco dependence syndrome 03/14/2014    Immunization History  Administered Date(s) Administered   Fluad Quad(high Dose 65+) 01/17/2020   PFIZER Comirnaty(Gray Top)Covid-19 Tri-Sucrose Vaccine 07/03/2020   PFIZER(Purple Top)SARS-COV-2 Vaccination 08/01/2019, 08/28/2019   Pneumococcal Polysaccharide-23 07/13/2018   Tdap 12/02/2014    Conditions to be addressed/monitored: Atrial Fibrillation, CHF, and COPD  Care Plan : Medication Management  Updates made by De Hollingshead, RPH-CPP since 12/10/2020 12:00 AM     Problem: COPD, Afib, CHF      Long-Range Goal: Disease Progression Prevention   Start Date: 11/27/2020  This Visit's Progress: On track  Recent Progress: On track  Priority: High  Note:   Current Barriers:  Unable to independently afford treatment regimen  Pharmacist Clinical Goal(s):  Over the next 90 days, patient will verbalize ability to afford treatment regimen through collaboration with PharmD and provider.   Interventions: 1:1 collaboration with Burnard Hawthorne, FNP regarding development and update of comprehensive plan of care as evidenced by provider attestation and co-signature Inter-disciplinary care team collaboration (see longitudinal plan of care) Comprehensive  medication review performed; medication list updated in electronic medical record  Atrial Fibrillation, HF, HTN: Improved; current rate control: diltiazem 240 mg daily; anticoagulant treatment: Eliquis 5 mg BID Additional antihypertensive: losartan 50 mg  Received patient portion of application for Eliquis. Provider portion received from cardiology. Application submitted, will collaborate w/ CPhT for f/u.  Chronic Obstructive Pulmonary Disease: Control limited by affordability; current treatment: Stiolto 2.5/2.5 mcg 2 puffs daily;  Most recent Pulmonary Function Testing: 09/06/2019-pulmonary function test-FVC 1.85 (57% predicted), postbronchodilator ratio 62, postbronchodilator FEV1 1.27 (52% predicted), no bronchodilator response Received patient portion of application for Stiolto. Application submitted, will collaborate w/ CPhT for f/u.  Hyperlipidemia: Uncontrolled; current treatment: atorvastatin 80 mg;  Moving forward, consider addition of ezetimibe to target goal LDL <70  Supplements: Vitamin D, multivitamin   Patient Goals/Self-Care Activities Over the next 90 days, patient will:  - take medications as prescribed collaborate with provider on medication access solutions  Follow Up Plan: Telephone follow up appointment with care management team member scheduled for: 4 weeks as previously scheduled      Medication Assistance: Application for Stiolto, Eliquis  medication assistance program. in process.  Anticipated assistance start date TBD.  See plan of care for additional detail.  Patient[facility]  [name] [id number]: [phone]: [phone]  Follow Up:  Patient agrees to Care Plan and Follow-up.  Plan: Telephone follow up appointment with care management team member scheduled for:  ~ 4 weeks as previously scheduled  Catie TDarnelle Maffucci PharmD, BParcelas Penuelas CChacraClinical  Pharmacist LOccidental [employer] B[employer] [phone]

## 2020-12-11 ENCOUNTER — Telehealth: Payer: Self-pay | Admitting: Pharmacy Technician

## 2020-12-11 DIAGNOSIS — Z596 Low income: Secondary | ICD-10-CM

## 2020-12-11 NOTE — Progress Notes (Signed)
[facility])                                            [place] Team    12/11/2020  ANESSIA JAFFE 07-21-1950 [id number]  Received both patient and provider portion(s) of patient assistance application(s) for Eliquis and Stiolto. Faxed completed application and required documents into [employer] and BI respectively.   Melane Windholz P. Damarien Nyman, [place]  [phone]

## 2020-12-14 ENCOUNTER — Telehealth: Payer: Self-pay | Admitting: Pharmacy Technician

## 2020-12-14 DIAGNOSIS — Z596 Low income: Secondary | ICD-10-CM

## 2020-12-14 NOTE — Progress Notes (Signed)
[facility])                                            [place] Team    12/14/2020  SHERRIKA RILEY 08-Jan-1951 [id number]  Care coordination call placed to [place] in regards to patient's application for Stiolto.  Spoke to [place] who informed patient was APPROVED 12/11/20-05/01/21. He informed patient would receive a 90 days supply delivered to her home in the next 5-10 business days.  Edlin Ford P. Festus Pursel, [place]  [phone]

## 2020-12-18 ENCOUNTER — Telehealth: Payer: Self-pay | Admitting: Pharmacy Technician

## 2020-12-18 DIAGNOSIS — Z596 Low income: Secondary | ICD-10-CM

## 2020-12-18 NOTE — Progress Notes (Signed)
[name])                                            [place] Team    12/18/2020  KIMIA FORSMAN [birth year] [id number]  Care coordination call placed to [employer] in regards to Eliquis application.  Spoke to [place] who informed patient was APPROVED 12/17/20-05/01/21. A 90 days supply of medication will be delivered to the patient's home.  Jacere Pangborn P. Delrick Dehart, [place]  [phone]

## 2020-12-21 NOTE — Telephone Encounter (Signed)
Letter received and patient has been approved from 12/17/20 to 05/01/21. Placed letter in samples closet file.

## 2020-12-29 ENCOUNTER — Other Ambulatory Visit: Payer: Self-pay

## 2020-12-29 MED ORDER — DILTIAZEM HCL ER COATED BEADS 240 MG PO CP24: 240.0000 mg | ORAL_CAPSULE | Freq: Every day | ORAL | 3 refills | Status: DC

## 2020-12-29 NOTE — Telephone Encounter (Signed)
Refill sent for Diltiazem 240 mg

## 2020-12-30 ENCOUNTER — Telehealth: Payer: Self-pay

## 2020-12-30 MED ORDER — ATORVASTATIN CALCIUM 80 MG PO TABS: 80.0000 mg | ORAL_TABLET | Freq: Every day | ORAL | 3 refills | Status: DC

## 2020-12-30 NOTE — Telephone Encounter (Signed)
This is a duplicate request, the medication has already been sent to the pharmacy per patient request. Note will be closed.

## 2020-12-30 NOTE — Telephone Encounter (Signed)
*  STAT* If patient is at the pharmacy, call can be transferred to refill team.   1. Which medications need to be refilled? (please list name of each medication and dose if known) Lipitor 80 mg 1 tablet daily  2. Which pharmacy/location (including street and city if local pharmacy) is medication to be sent to? [facility] [place]  3. Do they need a 30 day or 90 day supply? 90 day

## 2021-01-05 ENCOUNTER — Encounter: Payer: Self-pay | Admitting: Family

## 2021-01-06 ENCOUNTER — Telehealth: Payer: Self-pay | Admitting: Pulmonary Disease

## 2021-01-07 NOTE — Telephone Encounter (Signed)
Tracy Bell, can you contact pt to schedule her f/u CT?

## 2021-01-07 NOTE — Telephone Encounter (Signed)
Spoke with pt and scheduled f/u low dose ct 01/20/21 10:30 at [facility] in [place]. Pt verbalized understanding. Nothing further needed.

## 2021-01-12 NOTE — Progress Notes (Signed)
Pt has been scheduled.  °

## 2021-01-13 ENCOUNTER — Other Ambulatory Visit: Payer: Self-pay | Admitting: Family

## 2021-01-13 DIAGNOSIS — I10 Essential (primary) hypertension: Secondary | ICD-10-CM

## 2021-01-15 ENCOUNTER — Ambulatory Visit (INDEPENDENT_AMBULATORY_CARE_PROVIDER_SITE_OTHER): Payer: Medicare Other | Admitting: Pharmacist

## 2021-01-15 DIAGNOSIS — J449 Chronic obstructive pulmonary disease, unspecified: Secondary | ICD-10-CM

## 2021-01-15 DIAGNOSIS — I700 Atherosclerosis of aorta: Secondary | ICD-10-CM

## 2021-01-15 DIAGNOSIS — R002 Palpitations: Secondary | ICD-10-CM

## 2021-01-15 DIAGNOSIS — E7800 Pure hypercholesterolemia, unspecified: Secondary | ICD-10-CM

## 2021-01-15 DIAGNOSIS — F17200 Nicotine dependence, unspecified, uncomplicated: Secondary | ICD-10-CM

## 2021-01-15 NOTE — Patient Instructions (Addendum)
[place],   It was great to talk to you today!  We recommend you get the influenza vaccine for this season.   We recommend you get the updated bivalent COVID-19 booster, at least 2 months after any prior doses. You may consider delaying a booster dose by 3 months from a prior episode of COVID-19 per the CDC.   You can find pharmacies that have this formulation in stock at [email or web address].   We also would recommend you consider the Shingrix vaccine to protect against shingles. You can also get this at your local pharmacy.   Call me with any questions or concerns!  Catie Darnelle Maffucci, PharmD [phone]  Visit Information  PATIENT GOALS:  Goals Addressed               This Visit's Progress     Patient Stated     Medication Access (pt-stated)        Patient Goals/Self-Care Activities Over the next 90 days, patient will:  - take medications as prescribed collaborate with provider on medication access solutions         The patient verbalized understanding of instructions, educational materials, and care plan provided today and agreed to receive a mailed copy of patient instructions, educational materials, and care plan.   Plan: Telephone follow up appointment with care management team member scheduled for:  ~12 weeks   Catie Darnelle Maffucci, PharmD, [place], [place] Clinical Pharmacist [employer] at [employer] [phone]

## 2021-01-15 NOTE — Chronic Care Management (AMB) (Signed)
Chronic Care Management Pharmacy Note  01/15/2021 Name:  Tracy Bell MRN:  [id number] DOB:  Apr 08, 1951   Subjective: Tracy Bell is an 70 y.o. year old female who is a primary patient of Burnard Hawthorne, FNP.  The CCM team was consulted for assistance with disease management and care coordination needs.    Engaged with patient by telephone for follow up visit in response to provider referral for pharmacy case management and/or care coordination services.   Consent to Services:  The patient was given information about Chronic Care Management services, agreed to services, and gave verbal consent prior to initiation of services.  Please see initial visit note for detailed documentation.   Patient Care Team: Burnard Hawthorne, FNP as PCP - General (Family Medicine) Wellington Hampshire, MD as PCP - Cardiology (Cardiology) Arvil Chaco, PA-C as Physician Assistant (Cardiology) De Hollingshead, RPH-CPP as Pharmacist (Pharmacist)   Objective:  Lab Results  Component Value Date   CREATININE 0.97 09/09/2020   CREATININE 0.84 06/01/2020   CREATININE 0.94 05/07/2020    Lab Results  Component Value Date   HGBA1C 5.8 09/09/2020   Last diabetic Eye exam: No results found for: HMDIABEYEEXA  Last diabetic Foot exam: No results found for: HMDIABFOOTEX      Component Value Date/Time   CHOL 160 09/09/2020 1114   TRIG 111.0 09/09/2020 1114   HDL 51.10 09/09/2020 1114   CHOLHDL 3 09/09/2020 1114   VLDL 22.2 09/09/2020 1114   LDLCALC 86 09/09/2020 1114    Hepatic Function Latest Ref Rng & Units 05/15/2019 03/23/2018 11/05/2015  Total Protein 6.0 - 8.3 g/dL 7.2 7.1 7.0  Albumin 3.5 - 5.2 g/dL 4.5 4.4 4.4  AST 0 - 37 U/L '21 18 19  ' ALT 0 - 35 U/L '19 16 18  ' Alk Phosphatase 39 - 117 U/L 74 69 72  Total Bilirubin 0.2 - 1.2 mg/dL 0.8 0.5 1.0    Lab Results  Component Value Date/Time   TSH 6.38 (H) 10/19/2020 10:46 AM   TSH 4.60 (H) 09/09/2020 11:14 AM   FREET4  0.68 10/19/2020 10:46 AM   FREET4 0.79 03/14/2014 09:50 AM    CBC Latest Ref Rng & Units 01/17/2020 12/23/2019 07/27/2018  WBC 4.0 - 10.5 K/uL 8.1 9.1 11.3(H)  Hemoglobin 12.0 - 15.0 g/dL 15.2(H) 16.2(H) 15.2(H)  Hematocrit 36.0 - 46.0 % 44.7 47.1(H) 44.6  Platelets 150.0 - 400.0 K/uL 210.0 219 279.0    Lab Results  Component Value Date/Time   VD25OH 16.32 (L) 03/23/2018 10:10 AM   VD25OH 19.92 (L) 03/14/2014 09:50 AM    Clinical ASCVD: No  The 10-year ASCVD risk score (Arnett DK, et al., 2019) is: 25.6%   Values used to calculate the score:     Age: 50 years     Sex: Female     Is Non-Hispanic African American: No     Diabetic: No     Tobacco smoker: Yes     Systolic Blood Pressure: 008 mmHg     Is BP treated: Yes     HDL Cholesterol: 51.1 mg/dL     Total Cholesterol: 160 mg/dL      Social History   Tobacco Use  Smoking Status Every Day   Packs/day: 2.00   Years: 60.00   Pack years: 120.00   Types: Cigarettes   Start date: 05/03/1963  Smokeless Tobacco Never  Tobacco Comments   1.5PPD 06/18/2020   BP Readings from Last 3 Encounters:  11/27/20  138/82  11/24/20 (!) 140/94  10/27/20 (!) 162/82   Pulse Readings from Last 3 Encounters:  11/27/20 (!) 59  11/24/20 73  10/27/20 76   Wt Readings from Last 3 Encounters:  11/27/20 162 lb 6.4 oz (73.7 kg)  11/24/20 161 lb (73 kg)  10/27/20 156 lb (70.8 kg)    Assessment: Review of patient past medical history, allergies, medications, health status, including review of consultants reports, laboratory and other test data, was performed as part of comprehensive evaluation and provision of chronic care management services.   SDOH:  (Social Determinants of Health) assessments and interventions performed:  SDOH Interventions    Flowsheet Row Most Recent Value  SDOH Interventions   SDOH Interventions for the Following Domains Tobacco  Financial Strain Interventions Other (Comment)  [manufacturer assistance]  Tobacco  Interventions Cessation Materials Given and Reviewed       CCM Care Plan  No Known Allergies  Medications Reviewed Today     Reviewed by De Hollingshead, RPH-CPP (Pharmacist) on 01/15/21 at 1123  Med List Status: <None>   Medication Order Taking? Sig Documenting Provider Last Dose Status Informant  apixaban (ELIQUIS) 5 MG TABS tablet [id number] Yes Take 1 tablet (5 mg total) by mouth 2 (two) times daily. [name] D, PA-C Taking Active   atorvastatin (LIPITOR) 80 MG tablet [id number] Yes Take 1 tablet (80 mg total) by mouth daily. [name] D, PA-C Taking Active   cholecalciferol (VITAMIN D3) 25 MCG (1000 UT) tablet [id number] Yes Take 1,000 Units by mouth daily. [provider] Taking Active   diltiazem (CARDIZEM CD) 240 MG 24 hr capsule [id number] Yes Take 1 capsule (240 mg total) by mouth daily. [name] D, PA-C Taking Active   losartan (COZAAR) 50 MG tablet [id number] Yes TAKE 1 TABLET BY MOUTH EVERY DAY Burnard Hawthorne, FNP Taking Active   Multiple Vitamins-Minerals (CENTRUM ADULTS) TABS [id number] Yes Take 1 tablet by mouth daily. Jackolyn Confer, MD Taking Active   Tiotropium Bromide-Olodaterol (STIOLTO RESPIMAT) 2.5-2.5 MCG/ACT AERS [id number] Yes Inhale 2 puffs into the lungs daily. Lauraine Rinne, NP Taking Active             Patient Active Problem List   Diagnosis Date Noted   Pain in limb 10/28/2020   Abnormal findings on diagnostic imaging of lung 06/18/2020   Leg pain, lateral, left 06/12/2020   Healthcare maintenance 01/13/2020   Do not resuscitate discussion 12/03/2019   Lymphadenopathy 10/14/2019   Urinary incontinence 10/14/2019   SVT (supraventricular tachycardia) ([place]) 06/21/2019   Palpitations 07/27/2018   Carotid stenosis, right 07/12/2018   Carotid stenosis, bilateral 05/10/2018   Premature atrial contractions 05/01/2018   Erythrocytosis 04/14/2018   Left carotid bruit 04/10/2018   Thoracic aortic aneurysm without  rupture ([place]) 04/10/2018   SOB (shortness of breath) on exertion 04/05/2018   Depression, recurrent ([place]) 03/23/2018   Dizziness 03/23/2018   Abdominal aneurysm ([place]) 03/23/2018   Chest pain with high risk for cardiac etiology 12/15/2015   Stage 2 moderate COPD by GOLD classification ([place]) 12/15/2015   Nodule of left lung 11/17/2015   Hypercholesteremia 11/17/2015   Coronary atherosclerosis 11/05/2015   Personal history of tobacco use, presenting hazards to health 08/28/2015   Adjustment disorder with mixed anxiety and depressed mood 09/30/2014   Essential hypertension 05/22/2014   Atherosclerosis of aorta ([place]) 04/17/2014   Aortic stenosis, mild 04/17/2014   Tobacco use disorder 03/14/2014   Special screening for malignant neoplasms, colon 03/14/2014   Murmur 03/14/2014  Dysphagia 03/14/2014   Tobacco dependence syndrome 03/14/2014    Immunization History  Administered Date(s) Administered   Fluad Quad(high Dose 65+) 01/17/2020   PFIZER Comirnaty(Gray Top)Covid-19 Tri-Sucrose Vaccine 07/03/2020   PFIZER(Purple Top)SARS-COV-2 Vaccination 08/01/2019, 08/28/2019   Pneumococcal Polysaccharide-23 07/13/2018   Tdap 12/02/2014    Conditions to be addressed/monitored: Atrial Fibrillation, CHF, and COPD  Care Plan : Medication Management  Updates made by De Hollingshead, RPH-CPP since 01/15/2021 12:00 AM     Problem: COPD, Afib, CHF      Long-Range Goal: Disease Progression Prevention   Start Date: 11/27/2020  This Visit's Progress: On track  Recent Progress: On track  Priority: High  Note:   Current Barriers:  Unable to independently afford treatment regimen  Pharmacist Clinical Goal(s):  Over the next 90 days, patient will verbalize ability to afford treatment regimen through collaboration with PharmD and provider.   Interventions: 1:1 collaboration with Burnard Hawthorne, FNP regarding development and update of comprehensive plan of care as evidenced by provider  attestation and co-signature Inter-disciplinary care team collaboration (see longitudinal plan of care) Comprehensive medication review performed; medication list updated in electronic medical record  Health Maintenance Yearly influenza vaccination: due - recommended to pursue yearly dose Td/Tdap vaccination: up to date Pneumonia vaccination: due - previously declined by patient, will discuss moving forward COVID vaccinations: due - recommended bivalent booster, patient will consider Shingrix vaccinations: due - recommended patient pursue at local pharmacy, patient will consider Colonoscopy: due Bone density scan: up to date Mammogram: due  Atrial Fibrillation, HF, HTN: Improved per patient report; current rate control: diltiazem 240 mg daily; anticoagulant treatment: Eliquis 5 mg BID Additional antihypertensive: losartan 50 mg  Approved for Eliquis assistance through [employer]MS through 05/01/21 Home BP readings: Reports she only checks a few days before providers visits and if she is having a "spell". Reports occasional "spells". Feels that her pulse in her throat is fast, so she takes her blood pressure. Last time this happened, BP 110/79 and pulse was 85. Per her documentation, BP/HR are generally normal during these spells, though she does report 1 episode where pulse was in 100. She will rest, focus on deep breathing, and the spells will pass. Appears she did discuss these spells with cardiology at last visit 10/2020.  Encouraged to discuss these "spells" with cardiology moving forward, especially if increasing frequency moving forward.  Recommended to continue current regimen at this time.   Chronic Obstructive Pulmonary Disease: Moderately well controlled; current treatment: Stiolto 2.5/2.5 mcg 2 puffs daily;  Approved for Stiolto assistance through 05/01/21 Most recent Pulmonary Function Testing: 09/06/2019-pulmonary function test-FVC 1.85 (57% predicted), postbronchodilator ratio 62,  postbronchodilator FEV1 1.27 (52% predicted), no bronchodilator response Tobacco use: 1.5-2 packs per day; Previous quit attempts: unsuccessful, never used patches/gum, never tried Chantix but refuses to try due to what she has read about it. Hx bupropion with no benefit Triggers to smoke: habit, has episodes of panic when she thinks about quitting Discussed importance for overall health to consider cessation, or at least reduction. Patient is not interested in discussing at this time. Encouraged to continue to think about it. Discussed that black box warning for psychiatric side effects related to Chantix has been removed. Reviewed benefit of dual nicotine replacement therapy in the future if she is interested.  Recommended to continue current regimen at this itme   Hyperlipidemia: Uncontrolled; current treatment: atorvastatin 80 mg;  Moving forward, consider addition of ezetimibe to target goal LDL <70. Recommended to continue current  regimen at this time  Supplements: Vitamin D, multivitamin, reports she will also take ibuprofen 2-3 tabs PRN for sleep. Discussed increased bleed risk with NSAIDS + DOACs. Advised to minimize use of ibuprofen and preferentially use acetaminophen   Patient Goals/Self-Care Activities Over the next 90 days, patient will:  - take medications as prescribed collaborate with provider on medication access solutions  Follow Up Plan: Telephone follow up appointment with care management team member scheduled for: 12 weeks     Medication Assistance:  Stiolto obtained through [place] medication assistance program.  Enrollment ends 05/01/21.  Eliquis obtained through [employer] medication assistance program. Enrollment ends 05/01/21  Patient's preferred pharmacy is:  [id number] [place] [id number]: [phone]: [phone]  Follow Up:  Patient agrees to Care Plan and Follow-up.  Plan: Telephone  follow up appointment with care management team member scheduled for:  ~12 weeks  Catie TDarnelle Maffucci PharmD, [place] CMillicanClinical Pharmacist [phone]

## 2021-01-20 ENCOUNTER — Ambulatory Visit
Admission: RE | Admit: 2021-01-20 | Discharge: 2021-01-20 | Disposition: A | Discharge status: Home / Self Care | Payer: Medicare Other | Source: Ambulatory Visit | Attending: Acute Care | Admitting: Acute Care

## 2021-01-20 ENCOUNTER — Other Ambulatory Visit: Payer: Self-pay

## 2021-01-20 DIAGNOSIS — J439 Emphysema, unspecified: Secondary | ICD-10-CM | POA: Diagnosis not present

## 2021-01-20 DIAGNOSIS — R911 Solitary pulmonary nodule: Secondary | ICD-10-CM | POA: Diagnosis not present

## 2021-01-20 DIAGNOSIS — Z87891 Personal history of nicotine dependence: Secondary | ICD-10-CM | POA: Insufficient documentation

## 2021-01-20 DIAGNOSIS — I700 Atherosclerosis of aorta: Secondary | ICD-10-CM | POA: Diagnosis not present

## 2021-01-20 DIAGNOSIS — F17210 Nicotine dependence, cigarettes, uncomplicated: Secondary | ICD-10-CM | POA: Insufficient documentation

## 2021-01-29 DIAGNOSIS — J449 Chronic obstructive pulmonary disease, unspecified: Secondary | ICD-10-CM

## 2021-01-29 DIAGNOSIS — E7800 Pure hypercholesterolemia, unspecified: Secondary | ICD-10-CM

## 2021-02-18 NOTE — Progress Notes (Signed)
Please call patient and let them  know their  low dose Ct was read as a Lung  RADS 3, nodules that are probably benign findings, short term follow up suggested: includes nodules with a low likelihood of becoming a clinically active cancer. Radiology recommends a 6 month repeat LDCT follow up. .Please let them  know we will order and schedule their  annual screening scan for End of March 2023. Please let them  know there was notation of CAD on their  scan.  Please remind the patient  that this is a non-gated exam therefore degree or severity of disease  cannot be determined. Please have them  follow up with their PCP regarding potential risk factor modification, dietary therapy or pharmacologic therapy if clinically indicated. Pt.  is  currently on statin therapy. Please place order for annual  screening scan for  end of 06/2021 and fax results to PCP. Thanks so much.  Please let patient know there is a notation of a Dilated 4.4 cm ascending thoracic aorta. Recommend annual imaging follow up by CTA or MRA. Please refer to thoracic surgery . We can do annual monitoring through the screening program until she ages out in 63 years. Thanks so much

## 2021-02-23 ENCOUNTER — Ambulatory Visit: Payer: Medicare Other | Admitting: Family

## 2021-02-23 ENCOUNTER — Encounter: Payer: Self-pay | Admitting: Family

## 2021-03-01 ENCOUNTER — Encounter: Payer: Self-pay | Admitting: Dermatology

## 2021-03-01 ENCOUNTER — Ambulatory Visit: Payer: Medicare Other | Admitting: Dermatology

## 2021-03-01 ENCOUNTER — Other Ambulatory Visit: Payer: Self-pay

## 2021-03-01 ENCOUNTER — Other Ambulatory Visit: Payer: Self-pay | Admitting: *Deleted

## 2021-03-01 DIAGNOSIS — Z85828 Personal history of other malignant neoplasm of skin: Secondary | ICD-10-CM | POA: Diagnosis not present

## 2021-03-01 DIAGNOSIS — L905 Scar conditions and fibrosis of skin: Secondary | ICD-10-CM | POA: Diagnosis not present

## 2021-03-01 DIAGNOSIS — L578 Other skin changes due to chronic exposure to nonionizing radiation: Secondary | ICD-10-CM

## 2021-03-01 DIAGNOSIS — F17210 Nicotine dependence, cigarettes, uncomplicated: Secondary | ICD-10-CM

## 2021-03-01 DIAGNOSIS — L853 Xerosis cutis: Secondary | ICD-10-CM

## 2021-03-01 DIAGNOSIS — R911 Solitary pulmonary nodule: Secondary | ICD-10-CM

## 2021-03-01 DIAGNOSIS — Z87891 Personal history of nicotine dependence: Secondary | ICD-10-CM

## 2021-03-01 DIAGNOSIS — L821 Other seborrheic keratosis: Secondary | ICD-10-CM | POA: Diagnosis not present

## 2021-03-01 DIAGNOSIS — L570 Actinic keratosis: Secondary | ICD-10-CM | POA: Diagnosis not present

## 2021-03-01 DIAGNOSIS — B078 Other viral warts: Secondary | ICD-10-CM

## 2021-03-01 NOTE — Progress Notes (Signed)
   New Patient Visit  Subjective  Tracy Bell is a 70 y.o. female who presents for the following: Skin Problem (Check spots, hx of skin cancer on the scalp ). Pt has a list of spots she need checked today.   The following portions of the chart were reviewed this encounter and updated as appropriate:   Tobacco  Allergies  Meds  Problems  Med Hx  Surg Hx  Fam Hx     Review of Systems:  No other skin or systemic complaints except as noted in HPI or Assessment and Plan.  Objective  Well appearing patient in no apparent distress; mood and affect are within normal limits.  A focused examination was performed including face.scalp. Relevant physical exam findings are noted in the Assessment and Plan.  left posterior crown scalp Well healed scar with indention   left cheek x 2, right cheek x 1 (3) Erythematous thin papules/macules with gritty scale.   right thumb Verrucous papules -- Discussed viral etiology and contagion.    Assessment & Plan  Scar from previous cancer removal left posterior crown scalp Clear today no evidence of recurrence Benign-appearing.  Observation.  Call clinic for new or changing lesions.  Recommend daily use of broad spectrum spf 30+ sunscreen to sun-exposed areas.   AK (actinic keratosis) (3) left cheek x 2, right cheek x 1  Destruction of lesion - left cheek x 2, right cheek x 1 Complexity: simple   Destruction method: cryotherapy   Informed consent: discussed and consent obtained   Timeout:  patient name, date of birth, surgical site, and procedure verified Lesion destroyed using liquid nitrogen: Yes   Region frozen until ice ball extended beyond lesion: Yes   Outcome: patient tolerated procedure well with no complications   Post-procedure details: wound care instructions given    Other viral warts right thumb Discussed viral etiology and risk of spread.  Discussed multiple treatments may be required to clear warts.  Discussed possible  post-treatment dyspigmentation and risk of recurrence.   Pt declined treatment   Seborrheic Keratoses Cheeks,arms, left foot  - Stuck-on, waxy, tan-brown papules and/or plaques  - Benign-appearing - Discussed benign etiology and prognosis. - Observe - Call for any changes  Actinic Damage - chronic, secondary to cumulative UV radiation exposure/sun exposure over time - diffuse scaly erythematous macules with underlying dyspigmentation - Recommend daily broad spectrum sunscreen SPF 30+ to sun-exposed areas, reapply every 2 hours as needed.  - Recommend staying in the shade or wearing long sleeves, sun glasses (UVA+UVB protection) and wide brim hats (4-inch brim around the entire circumference of the hat). - Call for new or changing lesions.   Xerosis Body  - diffuse xerotic patches - recommend gentle, hydrating skin care - gentle skin care handout given   Return in about 1 year (around 03/01/2022) for hx of Skin cancer .  IMarye Round, CMA, am acting as scribe for Sarina Ser, MD .  Documentation: I have reviewed the above documentation for accuracy and completeness, and I agree with the above.  Sarina Ser, MD

## 2021-03-01 NOTE — Patient Instructions (Addendum)
Gentle Skin Care Guide  1. Bathe no more than once a day.  2. Avoid bathing in hot water  3. Use a mild soap like Dove, Vanicream, Cetaphil, CeraVe. Can use Lever 2000 or Cetaphil antibacterial soap  4. Use soap only where you need it. On most days, use it under your arms, between your legs, and on your feet. Let the water rinse other areas unless visibly dirty.  5. When you get out of the bath/shower, use a towel to gently blot your skin dry, don't rub it.  6. While your skin is still a little damp, apply a moisturizing cream such as Vanicream, CeraVe, Cetaphil, Eucerin, Sarna lotion or plain Vaseline Jelly. For hands apply Neutrogena [place] Hand Cream or Excipial Hand Cream.  7. Reapply moisturizer any time you start to itch or feel dry.  8. Sometimes using free and clear laundry detergents can be helpful. Fabric softener sheets should be avoided. Downy Free & Gentle liquid, or any liquid fabric softener that is free of dyes and perfumes, it acceptable to use  9. If your doctor has given you prescription creams you may apply moisturizers over them        Cryotherapy Aftercare  Wash gently with soap and water everyday.   Apply Vaseline and Band-Aid daily until healed.     If you have any questions or concerns for your doctor, please call our main line at [phone] and press option 4 to reach your doctor's medical assistant. If no one answers, please leave a voicemail as directed and we will return your call as soon as possible. Messages left after 4 pm will be answered the following business day.   You may also send [place] a message via [place]. We typically respond to MyChart messages within 1-2 business days.  For prescription refills, please ask your pharmacy to contact our office. Our fax number is [phone].  If you have an urgent issue when the clinic is closed that cannot wait until the next business day, you can page your doctor at the number below.    Please  note that while we do our best to be available for urgent issues outside of office hours, we are not available 24/7.   If you have an urgent issue and are unable to reach [place], you may choose to seek medical care at your doctor's office, retail clinic, urgent care center, or emergency room.  If you have a medical emergency, please immediately call 911 or go to the emergency department.  Pager Numbers  - Dr. Nehemiah Massed: [phone]  - Dr. Laurence Ferrari: [phone]  - Dr. Nicole Kindred: [phone]  In the event of inclement weather, please call our main line at [phone] for an update on the status of any delays or closures.  Dermatology Medication Tips: Please keep the boxes that topical medications come in in order to help keep track of the instructions about where and how to use these. Pharmacies typically print the medication instructions only on the boxes and not directly on the medication tubes.   If your medication is too expensive, please contact our office at [phone] option 4 or send [place] a message through [place].   We are unable to tell what your co-pay for medications will be in advance as this is different depending on your insurance coverage. However, we may be able to find a substitute medication at lower cost or fill out paperwork to get insurance to cover a needed medication.   If a prior authorization is  required to get your medication covered by your insurance company, please allow [place] 1-2 business days to complete this process.  Drug prices often vary depending on where the prescription is filled and some pharmacies may offer cheaper prices.  The website www.goodrx.com contains coupons for medications through different pharmacies. The prices here do not account for what the cost may be with help from insurance (it may be cheaper with your insurance), but the website can give you the price if you did not use any insurance.  - You can print the associated coupon and take it with  your prescription to the pharmacy.  - You may also stop by our office during regular business hours and pick up a GoodRx coupon card.  - If you need your prescription sent electronically to a different pharmacy, notify our office through [facility] or by phone at [phone] option 4.

## 2021-03-02 ENCOUNTER — Telehealth: Payer: Self-pay | Admitting: Family

## 2021-03-02 NOTE — Telephone Encounter (Signed)
  Tracy Bell,  Call pt  Let her known that CT chest reveals new solid nodule which she will require repeat imaging in 6 months time.  Lung cancer program  will order this image which will be due approximately 07/2021.  Please ask her to call our office if she does not hear from them in regards to scheduling this follow-up.    Emphasize importance of continued follow-up with Dr. Lucky Cowboy as thoracic aneurysm, due to size, will need to be followed every 6 months

## 2021-03-02 NOTE — Telephone Encounter (Signed)
FYI patient called and advised on message. She had also spoke with Doroteo Glassman, RN already as well. She is rescheduled with you 12/14 & wants to discuss should she still see as well as who she should see at cardiology since Lorenso Quarry left. She is very discouraged with being in Medicare donut hole & frustrated with her expensive drug costs.

## 2021-03-02 NOTE — Telephone Encounter (Signed)
-----   Message from Christie Beckers, RN sent at 03/01/2021  9:37 AM EDT ----- We will repeat Chest CT in 6 months

## 2021-03-03 NOTE — Telephone Encounter (Signed)
Dr. Rockey Situ was recommended & she stated that she would like to talk when she comes for f/u.

## 2021-03-03 NOTE — Telephone Encounter (Signed)
Noted Could recommend [facility] other providers- Dr Rockey Situ, Charlestine Night, [place], or [place]. All are excellent

## 2021-03-04 ENCOUNTER — Ambulatory Visit: Payer: Medicare Other | Admitting: Pulmonary Disease

## 2021-03-09 ENCOUNTER — Ambulatory Visit: Payer: Medicare Other | Admitting: Physician Assistant

## 2021-03-23 ENCOUNTER — Telehealth: Payer: Self-pay | Admitting: Pharmacy Technician

## 2021-03-23 ENCOUNTER — Ambulatory Visit (INDEPENDENT_AMBULATORY_CARE_PROVIDER_SITE_OTHER): Payer: Medicare Other | Admitting: Pharmacist

## 2021-03-23 DIAGNOSIS — J449 Chronic obstructive pulmonary disease, unspecified: Secondary | ICD-10-CM

## 2021-03-23 DIAGNOSIS — E7800 Pure hypercholesterolemia, unspecified: Secondary | ICD-10-CM

## 2021-03-23 DIAGNOSIS — I700 Atherosclerosis of aorta: Secondary | ICD-10-CM

## 2021-03-23 NOTE — Patient Instructions (Signed)
Visit Information  Following are the goals we discussed today:  Patient Goals/Self-Care Activities Over the next 90 days, patient will:  - take medications as prescribed collaborate with provider on medication access solutions        Plan: Telephone follow up appointment with care management team member scheduled for:  3 weeks as previously scheduled   Catie Darnelle Maffucci, PharmD, [place], CPP Clinical Pharmacist [place] at [facility] [phone]     Please call the care guide team at [phone] if you need to cancel or reschedule your appointment.   Patient verbalizes understanding of instructions provided today and agrees to view in [place].

## 2021-03-23 NOTE — Progress Notes (Signed)
[facility])                                            [place] Team    03/23/2021  Tracy Bell [birth year] [id number]  FOR 2023 RE ENROLLMENT                                      Medication Assistance Referral  Referral From: [facility] Embedded RPh [name]   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 Faxed completed application and required documents into [employer].   Jnyah Brazee P. Lonni Dirden, [place]  [phone]

## 2021-03-23 NOTE — Chronic Care Management (AMB) (Signed)
Chronic Care Management CCM Pharmacy Note  03/23/2021 Name:  Tracy Bell MRN:  [id number] DOB:  1951/04/06  Summary: - Due to reapply for patient assistance  Recommendations/Changes made from today's visit: - Completed Stiolto reapplication today.   Subjective: CAROLE DONER is an 70 y.o. year old female who is a primary patient of Burnard Hawthorne, FNP.  The CCM team was consulted for assistance with disease management and care coordination needs.    Care coordination  for  medication access  for pharmacy case management and/or care coordination services.   Objective:  Medications Reviewed Today     Reviewed by Ralene Bathe, MD (Physician) on 03/01/21 at [place] List Status: <None>   Medication Order Taking? Sig Documenting Provider Last Dose Status Informant  apixaban (ELIQUIS) 5 MG TABS tablet [id number] No Take 1 tablet (5 mg total) by mouth 2 (two) times daily. [name] D, PA-C Taking Active   atorvastatin (LIPITOR) 80 MG tablet [id number] No Take 1 tablet (80 mg total) by mouth daily. [name] D, PA-C Taking Active   cholecalciferol (VITAMIN D3) 25 MCG (1000 UT) tablet [id number] No Take 1,000 Units by mouth daily. [provider] Taking Active   diltiazem (CARDIZEM CD) 240 MG 24 hr capsule [id number] No Take 1 capsule (240 mg total) by mouth daily. [name] D, PA-C Taking Active   losartan (COZAAR) 50 MG tablet [id number] No TAKE 1 TABLET BY MOUTH EVERY DAY Arnett, Yvetta Coder, FNP Taking Active   Multiple Vitamins-Minerals (CENTRUM ADULTS) TABS [id number] No Take 1 tablet by mouth daily. Jackolyn Confer, MD Taking Active   Tiotropium Bromide-Olodaterol (STIOLTO RESPIMAT) 2.5-2.5 MCG/ACT AERS [id number] No Inhale 2 puffs into the lungs daily. Lauraine Rinne, NP Taking Active             Pertinent Labs:   Lab Results  Component Value Date   HGBA1C 5.8 09/09/2020   Lab Results  Component Value Date   CHOL 160  09/09/2020   HDL 51.10 09/09/2020   LDLCALC 86 09/09/2020   TRIG 111.0 09/09/2020   CHOLHDL 3 09/09/2020   Lab Results  Component Value Date   CREATININE 0.97 09/09/2020   BUN 16 09/09/2020   NA 141 09/09/2020   K 4.2 09/09/2020   CL 103 09/09/2020   CO2 30 09/09/2020    SDOH:  (Social Determinants of Health) assessments and interventions performed:  SDOH Interventions    Flowsheet Row Most Recent Value  SDOH Interventions   Financial Strain Interventions Other (Comment)  [manufacturer assistance]       CCM Care Plan  Review of patient past medical history, allergies, medications, health status, including review of consultants reports, laboratory and other test data, was performed as part of comprehensive evaluation and provision of chronic care management services.   Care Plan : Medication Management  Updates made by De Hollingshead, RPH-CPP since 03/23/2021 12:00 AM     Problem: COPD, Afib, CHF      Long-Range Goal: Disease Progression Prevention   Start Date: 11/27/2020  Recent Progress: On track  Priority: High  Note:   Current Barriers:  Unable to independently afford treatment regimen  Pharmacist Clinical Goal(s):  Over the next 90 days, patient will verbalize ability to afford treatment regimen through collaboration with PharmD and provider.   Interventions: 1:1 collaboration with Burnard Hawthorne, FNP regarding development and update of comprehensive plan of care as evidenced by provider attestation and co-signature Inter-disciplinary care team  collaboration (see longitudinal plan of care) Comprehensive medication review performed; medication list updated in electronic medical record  Health Maintenance Yearly influenza vaccination: due - recommended to pursue yearly dose Td/Tdap vaccination: up to date Pneumonia vaccination: due - previously declined by patient, will discuss moving forward COVID vaccinations: due - recommended bivalent booster,  patient will consider Shingrix vaccinations: due - recommended patient pursue at local pharmacy, patient will consider Colonoscopy: due Bone density scan: up to date Mammogram: due  Atrial Fibrillation, HF, HTN: Improved per patient report; current rate control: diltiazem 240 mg daily; anticoagulant treatment: Eliquis 5 mg BID Additional antihypertensive: losartan 50 mg  Approved for Eliquis assistance through BMS through 05/01/21. Has to meet out of pocket spend before she can reapply for 2023.  Previously recommended to continue current regimen at this time.   Chronic Obstructive Pulmonary Disease: Moderately well controlled; current treatment: Stiolto 2.5/2.5 mcg 2 puffs daily;  Approved for Stiolto assistance through 05/01/21 Most recent Pulmonary Function Testing: 09/06/2019-pulmonary function test-FVC 1.85 (57% predicted), postbronchodilator ratio 62, postbronchodilator FEV1 1.27 (52% predicted), no bronchodilator response Tobacco use: 1.5-2 packs per day; Previous quit attempts: unsuccessful, never used patches/gum, never tried Chantix but refuses to try due to what she has read about it. Hx bupropion with no benefit Triggers to smoke: habit, has episodes of panic when she thinks about quitting Due to reapply for Stiolto assistance for 2023. Completed application and submitted today. Will collaborate w/ CPhT for f/u  Hyperlipidemia: Uncontrolled; current treatment: atorvastatin 80 mg;  Moving forward, consider addition of ezetimibe to target goal LDL <70. Previously recommended to continue current regimen at this time  Supplements: Vitamin D, multivitamin   Patient Goals/Self-Care Activities Over the next 90 days, patient will:  - take medications as prescribed collaborate with provider on medication access solutions      Plan: Telephone follow up appointment with care management team member scheduled for:  3 weeks as previously scheduled  Catie Darnelle Maffucci, PharmD, [place],  [place] Pharmacist [employer] at [employer] [phone]

## 2021-03-31 DIAGNOSIS — J449 Chronic obstructive pulmonary disease, unspecified: Secondary | ICD-10-CM

## 2021-03-31 DIAGNOSIS — E7800 Pure hypercholesterolemia, unspecified: Secondary | ICD-10-CM | POA: Diagnosis not present

## 2021-04-13 ENCOUNTER — Ambulatory Visit: Payer: Medicare Other | Admitting: Pharmacist

## 2021-04-13 DIAGNOSIS — F17200 Nicotine dependence, unspecified, uncomplicated: Secondary | ICD-10-CM

## 2021-04-13 DIAGNOSIS — I471 Supraventricular tachycardia: Secondary | ICD-10-CM

## 2021-04-13 DIAGNOSIS — E7800 Pure hypercholesterolemia, unspecified: Secondary | ICD-10-CM

## 2021-04-13 DIAGNOSIS — J449 Chronic obstructive pulmonary disease, unspecified: Secondary | ICD-10-CM

## 2021-04-13 DIAGNOSIS — I700 Atherosclerosis of aorta: Secondary | ICD-10-CM

## 2021-04-13 NOTE — Patient Instructions (Signed)
Visit Information  Following are the goals we discussed today:  Patient Goals/Self-Care Activities Over the next 90 days, patient will:  - take medications as prescribed collaborate with provider on medication access solutions        Plan: Telephone follow up appointment with care management team member scheduled for:  3 months   Catie Darnelle Maffucci, PharmD, [place], CPP Clinical Pharmacist [place] at [facility] [phone]     Please call the care guide team at [phone] if you need to cancel or reschedule your appointment.   Patient verbalizes understanding of instructions provided today and agrees to view in [place].

## 2021-04-13 NOTE — Chronic Care Management (AMB) (Signed)
Chronic Care Management CCM Pharmacy Note  04/13/2021 Name:  AMAYRANY CAFARO MRN:  [id number] DOB:  [birth year]  Summary: - Approved for [place] assistance through 2023. Reviewed today  Recommendations/Changes made from today's visit: - Continue current regimen at this time. Follow up with PCP tomorrow as scheduled  Subjective: TERRIE HARING is an 70 y.o. year old female who is a primary patient of Burnard Hawthorne, FNP.  The CCM team was consulted for assistance with disease management and care coordination needs.    Engaged with patient by telephone for follow up visit for pharmacy case management and/or care coordination services.   Objective:  Medications Reviewed Today     Reviewed by De Hollingshead, RPH-CPP (Pharmacist) on 04/13/21 at 1049  Med List Status: <None>   Medication Order Taking? Sig Documenting Provider Last Dose Status Informant  apixaban (ELIQUIS) 5 MG TABS tablet [id number] Yes Take 1 tablet (5 mg total) by mouth 2 (two) times daily. [name] D, PA-C Taking Active   atorvastatin (LIPITOR) 80 MG tablet [id number] Yes Take 1 tablet (80 mg total) by mouth daily. [name] D, PA-C Taking Active   cholecalciferol (VITAMIN D3) 25 MCG (1000 UT) tablet [id number] Yes Take 1,000 Units by mouth daily. [provider] Taking Active   diltiazem (CARDIZEM CD) 240 MG 24 hr capsule [id number] Yes Take 1 capsule (240 mg total) by mouth daily. [name] D, PA-C Taking Active   losartan (COZAAR) 50 MG tablet [id number] Yes TAKE 1 TABLET BY MOUTH EVERY DAY Burnard Hawthorne, FNP Taking Active   Multiple Vitamins-Minerals (CENTRUM ADULTS) TABS [id number] Yes Take 1 tablet by mouth daily. Jackolyn Confer, MD Taking Active   Tiotropium Bromide-Olodaterol (STIOLTO RESPIMAT) 2.5-2.5 MCG/ACT AERS [id number] Yes Inhale 2 puffs into the lungs daily. Lauraine Rinne, NP Taking Active             Pertinent Labs:   Lab Results  Component Value  Date   HGBA1C 5.8 09/09/2020   Lab Results  Component Value Date   CHOL 160 09/09/2020   HDL 51.10 09/09/2020   LDLCALC 86 09/09/2020   TRIG 111.0 09/09/2020   CHOLHDL 3 09/09/2020   Lab Results  Component Value Date   CREATININE 0.97 09/09/2020   BUN 16 09/09/2020   NA 141 09/09/2020   K 4.2 09/09/2020   CL 103 09/09/2020   CO2 30 09/09/2020    SDOH:  (Social Determinants of Health) assessments and interventions performed:  SDOH Interventions    Flowsheet Row Most Recent Value  SDOH Interventions   Financial Strain Interventions Other (Comment)  [medication assistance]       CCM Care Plan  Review of patient past medical history, allergies, medications, health status, including review of consultants reports, laboratory and other test data, was performed as part of comprehensive evaluation and provision of chronic care management services.   Care Plan : Medication Management  Updates made by De Hollingshead, RPH-CPP since 04/13/2021 12:00 AM     Problem: COPD, Afib, CHF      Long-Range Goal: Disease Progression Prevention   Start Date: 11/27/2020  Recent Progress: On track  Priority: High  Note:   Current Barriers:  Unable to independently afford treatment regimen  Pharmacist Clinical Goal(s):  Over the next 90 days, patient will verbalize ability to afford treatment regimen through collaboration with PharmD and provider.   Interventions: 1:1 collaboration with Burnard Hawthorne, FNP regarding development and update of comprehensive plan of  care as evidenced by provider attestation and co-signature Inter-disciplinary care team collaboration (see longitudinal plan of care) Comprehensive medication review performed; medication list updated in electronic medical record  Health Maintenance Yearly influenza vaccination: due - recommended to pursue yearly dose Td/Tdap vaccination: up to date Pneumonia vaccination: due - previously declined by patient, will  discuss moving forward COVID vaccinations: due - recommended bivalent booster, patient will consider Shingrix vaccinations: due - recommended patient pursue at local pharmacy, patient will consider Colonoscopy: due Bone density scan: up to date Mammogram: due  Atrial Fibrillation, HF, HTN: Improved per patient report; current rate control: diltiazem 240 mg daily; anticoagulant treatment: Eliquis 5 mg BID Additional antihypertensive: losartan 50 mg  Approved for Eliquis assistance through BMS through 05/01/21. Has to meet out of pocket spend before she can reapply for 2023. Reviewed this today Reviewed that she is overdue for cardiology follow up. She will plan to call and schedule.  Recommended to continue current regimen at this time.   Chronic Obstructive Pulmonary Disease: Moderately well controlled; current treatment: Stiolto 2.5/2.5 mcg 2 puffs daily;  Approved for Stiolto assistance through 05/01/21 Most recent Pulmonary Function Testing: 09/06/2019-pulmonary function test-FVC 1.85 (57% predicted), postbronchodilator ratio 62, postbronchodilator FEV1 1.27 (52% predicted), no bronchodilator response Tobacco use: 1.5-2 packs per day; Previous quit attempts: unsuccessful, never used patches/gum, never tried Chantix but refuses to try due to what she has read about it. Hx bupropion with no benefit Triggers to smoke: habit, has episodes of panic when she thinks about quitting. Consider discussion regarding nicotine replacement therapy moving forward.  Approved for [place] for 2023. Reviewed refill procedure with her today. She verbalizes understanding and appreciation.   Hyperlipidemia: Uncontrolled; current treatment: atorvastatin 80 mg;  Moving forward, consider addition of ezetimibe to target goal LDL <70. Previously recommended to continue current regimen at this time. PCP follow up tomorrow.  Supplements: Vitamin D, multivitamin   Patient Goals/Self-Care Activities Over the next  90 days, patient will:  - take medications as prescribed collaborate with provider on medication access solutions      Plan: Telephone follow up appointment with care management team member scheduled for:  3 months  Catie Darnelle Maffucci, PharmD, [place], [place] Clinical Pharmacist [employer] at [employer] [phone]

## 2021-04-14 ENCOUNTER — Other Ambulatory Visit: Payer: Self-pay

## 2021-04-14 ENCOUNTER — Encounter: Payer: Self-pay | Admitting: Family

## 2021-04-14 ENCOUNTER — Ambulatory Visit (INDEPENDENT_AMBULATORY_CARE_PROVIDER_SITE_OTHER): Payer: Medicare Other | Admitting: Family

## 2021-04-14 VITALS — BP 138/70 | HR 65 | Temp 96.2°F | Ht 66.0 in | Wt 170.4 lb

## 2021-04-14 DIAGNOSIS — I10 Essential (primary) hypertension: Secondary | ICD-10-CM | POA: Diagnosis not present

## 2021-04-14 DIAGNOSIS — I700 Atherosclerosis of aorta: Secondary | ICD-10-CM | POA: Diagnosis not present

## 2021-04-14 DIAGNOSIS — J449 Chronic obstructive pulmonary disease, unspecified: Secondary | ICD-10-CM | POA: Diagnosis not present

## 2021-04-14 DIAGNOSIS — I471 Supraventricular tachycardia: Secondary | ICD-10-CM | POA: Diagnosis not present

## 2021-04-14 NOTE — Assessment & Plan Note (Signed)
Chronic, stable.  Pending updating lipid panel.  Continue atorvastatin 80 mg

## 2021-04-14 NOTE — Patient Instructions (Addendum)
CT chest due 06/2021 Please let me know if you are not contacted by with our pulmonology to schedule this.  I can see order in the system.  For cardiology, look into Dr Rockey Situ, Dr End, or Dr Charlestine Night.   For pulmonology, look into Dr Mortimer Fries

## 2021-04-14 NOTE — Assessment & Plan Note (Signed)
Chronic, symptomatically stable . Continue diltiazem 240 mg, Eliquis 5 mg twice daily as managed by cardiology.  Advised to make prompt follow-up with cardiology and maintain regular surveillance.  Patient verbalized understanding will call to schedule.

## 2021-04-14 NOTE — Assessment & Plan Note (Signed)
Chronic, stable.  Continue losartan 50 mg 

## 2021-04-14 NOTE — Assessment & Plan Note (Signed)
Chronic, overall stable. Anticipate progression in setting of smoking.  She will continue follow-up with pulmonology as I  encouraged her to do so today.  She will call to schedule appointment

## 2021-04-14 NOTE — Progress Notes (Signed)
Subjective:    Patient ID: Tracy Bell, female    DOB: May 07, 1950, 70 y.o.   MRN: [id number]  CC: Tracy Bell is a 70 y.o. female who presents today for follow up.   HPI: She feels well today.  No chest pain or recent episodes of palpitations.  She has been overall very pleased with her health.  She has yet to establish with a new cardiologist since her prior PA has left.  She is considering which provider to establish with. She was last seen July of this year    Hyperlipidemia-compliant with atorvastatin 80 mg . She is not fasting today.   atrial fibrillation, h/o SVT-compliant with diltiazem 240 mg, Eliquis 5 mg twice daily.   Hypertension-compliant losartan 50 mg   COPD-compliant with Stiolto 2.5/2.60mcg. SOB at baseline.   Lung RADS 3, probably benign findings CT chest 01/21/21.  Due for follow-up in 6 months HISTORY:  Past Medical History:  Diagnosis Date   Anxiety    COPD (chronic obstructive pulmonary disease) (HCC)    Cough    Depression    Dizziness    Dyspnea    Heart murmur    Hyperlipidemia    Hypertension    Lightheadedness    Personal history of tobacco use, presenting hazards to health 08/28/2015   Past Surgical History:  Procedure Laterality Date   ABDOMINAL HYSTERECTOMY  1980   complete   BREAST BIOPSY  1970   normal   DILATION AND CURETTAGE OF UTERUS     ENDARTERECTOMY Right 07/12/2018   Procedure: ENDARTERECTOMY CAROTID;  Surgeon: Algernon Huxley, MD;  Location: [facility] ORS;  Service: Vascular;  Laterality: Right;   TUBAL LIGATION     Family History  Problem Relation Age of Onset   Cancer Mother        Pancreatic Cancer    Cancer Father        Stomach Cancer    Lung cancer Sister    Cancer Sister        ovarian   Lung cancer Sister     Allergies: Patient has no known allergies. Current Outpatient Medications on File Prior to Visit  Medication Sig Dispense Refill   apixaban (ELIQUIS) 5 MG TABS tablet Take 1 tablet (5 mg total) by mouth  2 (two) times daily. 60 tablet 11   atorvastatin (LIPITOR) 80 MG tablet Take 1 tablet (80 mg total) by mouth daily. 90 tablet 3   cholecalciferol (VITAMIN D3) 25 MCG (1000 UT) tablet Take 1,000 Units by mouth daily.     diltiazem (CARDIZEM CD) 240 MG 24 hr capsule Take 1 capsule (240 mg total) by mouth daily. 90 capsule 3   losartan (COZAAR) 50 MG tablet TAKE 1 TABLET BY MOUTH EVERY DAY 90 tablet 1   Multiple Vitamins-Minerals (CENTRUM ADULTS) TABS Take 1 tablet by mouth daily. 30 tablet 6   Tiotropium Bromide-Olodaterol (STIOLTO RESPIMAT) 2.5-2.5 MCG/ACT AERS Inhale 2 puffs into the lungs daily. 4 g 0   No current facility-administered medications on file prior to visit.    Social History   Tobacco Use   Smoking status: Every Day    Packs/day: 2.00    Years: 60.00    Pack years: 120.00    Types: Cigarettes    Start date: 05/03/1963   Smokeless tobacco: Never   Tobacco comments:    1.5PPD 06/18/2020  Vaping Use   Vaping Use: Never used  Substance Use Topics   Alcohol use: Yes  Alcohol/week: 0.0 standard drinks    Comment: [place] occasionally   Drug use: No    Review of Systems  Constitutional:  Negative for chills and fever.  Respiratory:  Positive for shortness of breath (at baseline). Negative for cough.   Cardiovascular:  Negative for chest pain and palpitations.  Gastrointestinal:  Negative for nausea and vomiting.     Objective:    BP 138/70    Pulse 65    Temp (!) 96.2 F (35.7 C) (Skin)    Ht 5\' 6"  (1.676 m)    Wt 170 lb 6.4 oz (77.3 kg)    SpO2 93%    BMI 27.50 kg/m  BP Readings from Last 3 Encounters:  04/14/21 138/70  11/27/20 138/82  11/24/20 (!) 140/94   Wt Readings from Last 3 Encounters:  04/14/21 170 lb 6.4 oz (77.3 kg)  11/27/20 162 lb 6.4 oz (73.7 kg)  11/24/20 161 lb (73 kg)    Physical Exam Vitals reviewed.  Constitutional:      Appearance: She is well-developed.  Eyes:     Conjunctiva/sclera: Conjunctivae normal.  Cardiovascular:      Rate and Rhythm: Normal rate and regular rhythm.     Pulses: Normal pulses.     Heart sounds: Normal heart sounds.  Pulmonary:     Effort: Pulmonary effort is normal.     Breath sounds: Normal breath sounds. No wheezing, rhonchi or rales.  Skin:    General: Skin is warm and dry.  Neurological:     Mental Status: She is alert.  Psychiatric:        Speech: Speech normal.        Behavior: Behavior normal.        Thought Content: Thought content normal.       Assessment & Plan:   Problem List Items Addressed This Visit       Cardiovascular and Mediastinum   Atherosclerosis of aorta (HCC)    Chronic, stable.  Pending updating lipid panel.  Continue atorvastatin 80 mg       Essential hypertension - Primary    Chronic, stable.  Continue losartan 50mg       Relevant Orders   Lipid panel   Comprehensive metabolic panel   T3, free   T4, free   TSH   SVT (supraventricular tachycardia) (HCC)    Chronic, symptomatically stable . Continue diltiazem 240 mg, Eliquis 5 mg twice daily as managed by cardiology.  Advised to make prompt follow-up with cardiology and maintain regular surveillance.  Patient verbalized understanding will call to schedule.         Respiratory   Stage 2 moderate COPD by GOLD classification (HCC)    Chronic, overall stable. Anticipate progression in setting of smoking.  She will continue follow-up with pulmonology as I  encouraged her to do so today.  She will call to schedule appointment        I am having Tracy Bell maintain her Centrum Adults, cholecalciferol, apixaban, Stiolto Respimat, diltiazem, atorvastatin, and losartan.   No orders of the defined types were placed in this encounter.   Return precautions given.   Risks, benefits, and alternatives of the medications and treatment plan prescribed today were discussed, and patient expressed understanding.   Education regarding symptom management and diagnosis given to patient on  AVS.  Continue to follow with Burnard Hawthorne, FNP for routine health maintenance.   Maeson M Kossman and I agreed with plan.   [name], FNP

## 2021-04-20 ENCOUNTER — Other Ambulatory Visit (INDEPENDENT_AMBULATORY_CARE_PROVIDER_SITE_OTHER): Payer: Medicare Other

## 2021-04-20 ENCOUNTER — Ambulatory Visit (INDEPENDENT_AMBULATORY_CARE_PROVIDER_SITE_OTHER): Payer: Medicare Other | Admitting: Vascular Surgery

## 2021-04-21 ENCOUNTER — Telehealth: Payer: Self-pay | Admitting: Pharmacy Technician

## 2021-04-21 DIAGNOSIS — Z596 Low income: Secondary | ICD-10-CM

## 2021-04-21 NOTE — Progress Notes (Signed)
[place] [facility])                                            [place] Team    04/21/2021  Tracy Bell 08/17/1950 [id number]  Care coordination call placed to BI in regard to [facility] application.   Spoke to [place] who informed patient was APPROVED 05/02/21-05/01/22. Patient will need to call in for refills as done previously in 2022 and medication will be shipped to patient's home. Patient is aware.  Tracy Bell P. Teshara Moree, [place]  [phone]

## 2021-05-26 ENCOUNTER — Telehealth: Payer: Self-pay | Admitting: Pulmonary Disease

## 2021-05-26 NOTE — Telephone Encounter (Signed)
Will await Dr. Kasa's response.  

## 2021-05-26 NOTE — Telephone Encounter (Signed)
Patient is requesting to switch from Dr. Patsey Berthold to Dr. Mortimer Fries.    Dr. Patsey Berthold and Dr. Mortimer Fries, please advise. Thanks

## 2021-05-26 NOTE — Telephone Encounter (Signed)
I really have not seen her since June 2021.  She was then seen by Wyn Quaker after that.  It is okay with me if she wants to switch.

## 2021-05-27 NOTE — Telephone Encounter (Signed)
Appt scheduled 08/04/2021 at 10:30. Patient is aware and voiced her understanding.  Nothing further needed at this time.

## 2021-06-09 ENCOUNTER — Encounter: Payer: Self-pay | Admitting: Family

## 2021-07-06 ENCOUNTER — Telehealth: Payer: Self-pay | Admitting: Family

## 2021-07-06 ENCOUNTER — Ambulatory Visit: Payer: Self-pay | Admitting: Pharmacist

## 2021-07-06 ENCOUNTER — Encounter: Payer: Self-pay | Admitting: Family

## 2021-07-06 NOTE — Chronic Care Management (AMB) (Signed)
?  Chronic Care Management  ? ?Note ? ?07/06/2021 ?Name: Tracy Bell MRN: [id number] DOB: [birth year] ? ? ? ?Closing pharmacy CCM case at this time. Will collaborate with Care Guide to outreach to schedule follow up with RN CM. Patient has clinic contact information for future questions or concerns.  ? ?Catie Darnelle Maffucci, PharmD, [place], CPP ?Clinical Pharmacist ?Therapist, music at [employer] ?[phone] ? ?

## 2021-07-06 NOTE — Telephone Encounter (Signed)
Pt want to have lab work done with her 3/15 appointment if the provider needs it. ?

## 2021-07-12 ENCOUNTER — Other Ambulatory Visit: Payer: Self-pay

## 2021-07-12 ENCOUNTER — Telehealth: Payer: Medicare Other

## 2021-07-12 ENCOUNTER — Telehealth: Payer: Self-pay

## 2021-07-12 ENCOUNTER — Other Ambulatory Visit (INDEPENDENT_AMBULATORY_CARE_PROVIDER_SITE_OTHER): Payer: Medicare Other

## 2021-07-12 DIAGNOSIS — I10 Essential (primary) hypertension: Secondary | ICD-10-CM | POA: Diagnosis not present

## 2021-07-12 LAB — LIPID PANEL
Cholesterol: 137 mg/dL (ref 0–200)
HDL: 44.7 mg/dL (ref >39.00)
LDL Cholesterol: 74 mg/dL (ref 0–99)
NonHDL: 92.39
Total CHOL/HDL Ratio: 3
Triglycerides: 92 mg/dL (ref 0.0–149.0)
VLDL: 18.4 mg/dL (ref 0.0–40.0)

## 2021-07-12 LAB — COMPREHENSIVE METABOLIC PANEL
ALT: 21 U/L (ref 0–35)
AST: 19 U/L (ref 0–37)
Albumin: 4.4 g/dL (ref 3.5–5.2)
Alkaline Phosphatase: 76 U/L (ref 39–117)
BUN: 18 mg/dL (ref 6–23)
CO2: 31 mEq/L (ref 19–32)
Calcium: 9.3 mg/dL (ref 8.4–10.5)
Chloride: 103 mEq/L (ref 96–112)
Creatinine, Ser: 1.14 mg/dL (ref 0.40–1.20)
GFR: 47.89 mL/min — ABNORMAL LOW (ref >60.00)
Glucose, Bld: 93 mg/dL (ref 70–99)
Potassium: 4.3 mEq/L (ref 3.5–5.1)
Sodium: 140 mEq/L (ref 135–145)
Total Bilirubin: 0.7 mg/dL (ref 0.2–1.2)
Total Protein: 7 g/dL (ref 6.0–8.3)

## 2021-07-12 LAB — TSH: TSH: 6.71 u[IU]/mL — ABNORMAL HIGH (ref 0.35–5.50)

## 2021-07-12 LAB — T3, FREE: T3, Free: 2.9 pg/mL (ref 2.3–4.2)

## 2021-07-12 LAB — T4, FREE: Free T4: 0.65 ng/dL (ref 0.60–1.60)

## 2021-07-12 NOTE — Chronic Care Management (AMB) (Signed)
?  Chronic Care Management  ? ?Note ? ?07/12/2021 ?Name: DIM MEISINGER MRN: [id number] DOB: December 11, 1950 ? ?Ceasia SARIN COMUNALE is a 71 y.o. year old female who is a primary care patient of Burnard Hawthorne, FNP. Cristen KARYSA HEFT is currently enrolled in care management services. An additional referral for RN CM  was placed.  ? ?Follow up plan: ?Patient declines further follow up and engagement by the care management team. Appropriate care team members and provider have been notified via electronic communication.  ? ?Noreene Larsson, RMA ?Care Guide, Embedded Care Coordination ?[facility]  ?[place], [place] [address] ?Direct Dial: [phone] ?[email or web address] ?[email or web address]: [email or web address]  ? ?

## 2021-07-14 ENCOUNTER — Ambulatory Visit (INDEPENDENT_AMBULATORY_CARE_PROVIDER_SITE_OTHER): Payer: Medicare Other | Admitting: Family

## 2021-07-14 ENCOUNTER — Other Ambulatory Visit: Payer: Self-pay

## 2021-07-14 ENCOUNTER — Encounter: Payer: Self-pay | Admitting: Family

## 2021-07-14 DIAGNOSIS — E038 Other specified hypothyroidism: Secondary | ICD-10-CM

## 2021-07-14 DIAGNOSIS — I2510 Atherosclerotic heart disease of native coronary artery without angina pectoris: Secondary | ICD-10-CM

## 2021-07-14 DIAGNOSIS — I10 Essential (primary) hypertension: Secondary | ICD-10-CM

## 2021-07-14 DIAGNOSIS — I471 Supraventricular tachycardia: Secondary | ICD-10-CM

## 2021-07-14 MED ORDER — APIXABAN 5 MG PO TABS: 5.0000 mg | ORAL_TABLET | Freq: Two times a day (BID) | ORAL | 11 refills | Status: DC

## 2021-07-14 NOTE — Assessment & Plan Note (Signed)
Chronic, stable.  No increase of palpitations.  Continue  diltiazem 240 mg, Eliquis 5 mg twice daily.  Advised her the importance of reestablishing with cardiology for ongoing surveillance.  She politely declines at this time.  We will discuss this at follow-up.  I have refilled her Eliquis as requested today ?

## 2021-07-14 NOTE — Assessment & Plan Note (Signed)
Chronic, stable.  Continue losartan 50 mg 

## 2021-07-14 NOTE — Progress Notes (Signed)
? ?Subjective:  ? ? Patient ID: Tracy Bell, female    DOB: 07/03/1950, 71 y.o.   MRN: [id number] ? ?CC: Tracy Bell is a 71 y.o. female who presents today for follow up.  ? ?HPI: Feels well today.  No new complaints ? ?Subclinical hypothyroidism-occasional constipation but not particularly bothersome.  She will take MiraLAX occasionally for this. She cant recall the last time she had palpitations. ? ?Rare NSAIDs. Drinking plenty of water.  ? ?atherosclerosis of aorta-compliant with atorvastatin 80 mg ? ?Hypertension-compliant with losartan 50 mg ? ?SVT-compliant with diltiazem 240 mg, Eliquis 5 mg twice daily. No cp, sob. She requests refill of eliquis ? ?COPD-upcoming follow-up with Dr. Mortimer Fries, pulmonology 08/04/2021 ? ?HISTORY:  ?Past Medical History:  ?Diagnosis Date  ? Anxiety   ? COPD (chronic obstructive pulmonary disease) ([place])   ? Cough   ? Depression   ? Dizziness   ? Dyspnea   ? Heart murmur   ? Hyperlipidemia   ? Hypertension   ? Lightheadedness   ? Personal history of tobacco use, presenting hazards to health 08/28/2015  ? ?Past Surgical History:  ?Procedure Laterality Date  ? ABDOMINAL HYSTERECTOMY  1980  ? complete  ? BREAST BIOPSY  1970  ? normal  ? DILATION AND CURETTAGE OF UTERUS    ? ENDARTERECTOMY Right 07/12/2018  ? Procedure: ENDARTERECTOMY CAROTID;  Surgeon: Algernon Huxley, MD;  Location: [facility] ORS;  Service: Vascular;  Laterality: Right;  ? TUBAL LIGATION    ? ?Family History  ?Problem Relation Age of Onset  ? Cancer Mother   ?     Pancreatic Cancer   ? Cancer Father   ?     Stomach Cancer   ? Lung cancer Sister   ? Cancer Sister 63  ?     ovarian  ? Lung cancer Sister   ? ? ?Allergies: Patient has no known allergies. ?Current Outpatient Medications on File Prior to Visit  ?Medication Sig Dispense Refill  ? atorvastatin (LIPITOR) 80 MG tablet Take 1 tablet (80 mg total) by mouth daily. 90 tablet 3  ? cholecalciferol (VITAMIN D3) 25 MCG (1000 UT) tablet Take 1,000 Units by mouth daily.    ?  diltiazem (CARDIZEM CD) 240 MG 24 hr capsule Take 1 capsule (240 mg total) by mouth daily. 90 capsule 3  ? losartan (COZAAR) 50 MG tablet TAKE 1 TABLET BY MOUTH EVERY DAY 90 tablet 1  ? Multiple Vitamins-Minerals (CENTRUM ADULTS) TABS Take 1 tablet by mouth daily. 30 tablet 6  ? Tiotropium Bromide-Olodaterol (STIOLTO RESPIMAT) 2.5-2.5 MCG/ACT AERS Inhale 2 puffs into the lungs daily. 4 g 0  ? ?No current facility-administered medications on file prior to visit.  ? ? ?Social History  ? ?Tobacco Use  ? Smoking status: Every Day  ?  Packs/day: 2.00  ?  Years: 60.00  ?  Pack years: 120.00  ?  Types: Cigarettes  ?  Start date: 05/03/1963  ? Smokeless tobacco: Never  ? Tobacco comments:  ?  1.5PPD 06/18/2020  ?Vaping Use  ? Vaping Use: Never used  ?Substance Use Topics  ? Alcohol use: Yes  ?  Alcohol/week: 0.0 standard drinks  ?  Comment: [place] occasionally  ? Drug use: No  ? ? ?Review of Systems  ?Constitutional:  Negative for chills and fever.  ?Respiratory:  Negative for cough and shortness of breath.   ?Cardiovascular:  Negative for chest pain, palpitations and leg swelling.  ?Gastrointestinal:  Negative for nausea and  vomiting.  ?   ?Objective:  ?  ?BP 126/78 (BP Location: Left Arm, Patient Position: Sitting, Cuff Size: Normal)   Pulse (!) 101   Temp 97.9 ?F (36.6 ?C) (Oral)   Ht '5\' 5"'$  (1.651 m)   Wt 167 lb 6.4 oz (75.9 kg)   SpO2 96%   BMI 27.86 kg/m?  ?BP Readings from Last 3 Encounters:  ?07/14/21 126/78  ?04/14/21 138/70  ?11/27/20 138/82  ? ?Wt Readings from Last 3 Encounters:  ?07/14/21 167 lb 6.4 oz (75.9 kg)  ?04/14/21 170 lb 6.4 oz (77.3 kg)  ?11/27/20 162 lb 6.4 oz (73.7 kg)  ? ? ?Physical Exam ?Vitals reviewed.  ?Constitutional:   ?   Appearance: She is well-developed.  ?Eyes:  ?   Conjunctiva/sclera: Conjunctivae normal.  ?Cardiovascular:  ?   Rate and Rhythm: Normal rate and regular rhythm.  ?   Pulses: Normal pulses.  ?   Heart sounds: Normal heart sounds.  ?Pulmonary:  ?   Effort: Pulmonary  effort is normal.  ?   Breath sounds: Normal breath sounds. No wheezing, rhonchi or rales.  ?Skin: ?   General: Skin is warm and dry.  ?Neurological:  ?   Mental Status: She is alert.  ?Psychiatric:     ?   Speech: Speech normal.     ?   Behavior: Behavior normal.     ?   Thought Content: Thought content normal.  ? ? ?   ?Assessment & Plan:  ? ?Problem List Items Addressed This Visit   ? ?  ? Cardiovascular and Mediastinum  ? Coronary atherosclerosis  ?  Excellent control.  Continue atorvastatin 80 mg ?  ?  ? Relevant Medications  ? apixaban (ELIQUIS) 5 MG TABS tablet  ? Essential hypertension  ?  Chronic, stable.  Continue losartan 50 mg ?  ?  ? Relevant Medications  ? apixaban (ELIQUIS) 5 MG TABS tablet  ? SVT (supraventricular tachycardia) ([place])  ?  Chronic, stable.  No increase of palpitations.  Continue  diltiazem 240 mg, Eliquis 5 mg twice daily.  Advised her the importance of reestablishing with cardiology for ongoing surveillance.  She politely declines at this time.  We will discuss this at follow-up.  I have refilled her Eliquis as requested today ?  ?  ? Relevant Medications  ? apixaban (ELIQUIS) 5 MG TABS tablet  ?  ? Endocrine  ? Subclinical hypothyroidism  ?  TSH 6.7 ( 6.38) .  No overt symptoms of hypothyroidism and agreed to defer starting thyroid replacement.  We jointly agreed to continue to monitor as she may convert to hypothyroidism ?  ?  ? ? ? ?I am having [name] maintain her Centrum Adults, cholecalciferol, Stiolto Respimat, diltiazem, atorvastatin, losartan, and apixaban. ? ? ?Meds ordered this encounter  ?Medications  ? apixaban (ELIQUIS) 5 MG TABS tablet  ?  Sig: Take 1 tablet (5 mg total) by mouth 2 (two) times daily.  ?  Dispense:  60 tablet  ?  Refill:  11  ?  Order Specific Question:   Supervising Provider  ?  Answer:   Crecencio Mc [[id number]]  ? ? ?Return precautions given.  ? ?Risks, benefits, and alternatives of the medications and treatment plan prescribed today were  discussed, and patient expressed understanding.  ? ?Education regarding symptom management and diagnosis given to patient on AVS. ? ?Continue to follow with Burnard Hawthorne, FNP for routine health maintenance.  ? ?[name] and  I agreed with plan.  ? ?[name], FNP ? ? ?

## 2021-07-14 NOTE — Assessment & Plan Note (Signed)
Excellent control.  Continue atorvastatin 80 mg ?

## 2021-07-14 NOTE — Assessment & Plan Note (Signed)
TSH 6.7 ( 6.38) .  No overt symptoms of hypothyroidism and agreed to defer starting thyroid replacement.  We jointly agreed to continue to monitor as she may convert to hypothyroidism ?

## 2021-07-14 NOTE — Patient Instructions (Addendum)
Nice to see you!   

## 2021-07-15 ENCOUNTER — Other Ambulatory Visit: Payer: Self-pay | Admitting: Family

## 2021-07-15 ENCOUNTER — Telehealth: Payer: Self-pay | Admitting: Family

## 2021-07-15 NOTE — Telephone Encounter (Signed)
Called pt to schedule follow up appt for 3 Months Chronic Management.... Pt stated that she doesn't have her calendar and that she will call the office back later to schedule follow up...  ?

## 2021-07-21 ENCOUNTER — Other Ambulatory Visit: Payer: Self-pay

## 2021-07-21 ENCOUNTER — Encounter: Payer: Self-pay | Admitting: Family

## 2021-07-21 DIAGNOSIS — I10 Essential (primary) hypertension: Secondary | ICD-10-CM

## 2021-07-21 MED ORDER — LOSARTAN POTASSIUM 50 MG PO TABS: 50.0000 mg | ORAL_TABLET | Freq: Every day | ORAL | 3 refills | Status: DC

## 2021-08-04 ENCOUNTER — Ambulatory Visit: Payer: Medicare Other | Admitting: Internal Medicine

## 2021-08-11 ENCOUNTER — Other Ambulatory Visit: Payer: Self-pay

## 2021-08-11 ENCOUNTER — Ambulatory Visit
Admission: RE | Admit: 2021-08-11 | Discharge: 2021-08-11 | Disposition: A | Discharge status: Home / Self Care | Payer: Medicare Other | Source: Ambulatory Visit | Attending: Acute Care | Admitting: Acute Care

## 2021-08-11 DIAGNOSIS — J439 Emphysema, unspecified: Secondary | ICD-10-CM | POA: Diagnosis not present

## 2021-08-11 DIAGNOSIS — F17210 Nicotine dependence, cigarettes, uncomplicated: Secondary | ICD-10-CM | POA: Diagnosis not present

## 2021-08-11 DIAGNOSIS — I3139 Other pericardial effusion (noninflammatory): Secondary | ICD-10-CM | POA: Diagnosis not present

## 2021-08-11 DIAGNOSIS — Z87891 Personal history of nicotine dependence: Secondary | ICD-10-CM | POA: Diagnosis not present

## 2021-08-11 DIAGNOSIS — R911 Solitary pulmonary nodule: Secondary | ICD-10-CM | POA: Diagnosis not present

## 2021-08-16 ENCOUNTER — Other Ambulatory Visit: Payer: Self-pay | Admitting: Acute Care

## 2021-08-16 DIAGNOSIS — Z87891 Personal history of nicotine dependence: Secondary | ICD-10-CM

## 2021-08-16 DIAGNOSIS — Z122 Encounter for screening for malignant neoplasm of respiratory organs: Secondary | ICD-10-CM

## 2021-08-16 DIAGNOSIS — F17210 Nicotine dependence, cigarettes, uncomplicated: Secondary | ICD-10-CM

## 2021-08-31 ENCOUNTER — Telehealth: Payer: Self-pay | Admitting: Family

## 2021-08-31 NOTE — Telephone Encounter (Signed)
Call pt ? ? ?I received results from your annual CT lung scan from [place] which showed benign findings of lungs. You will need do this again next year so please ensure you are contacted directed from [facility] to schedule.  ? ?Please call our office if you are not contacted for another test.  ? ?You have small amount of fluid around your heart and need follow up with cardiology.  ? ?You are overdue for follow-up with cardiology,please call to schedule with heartcare asap.  I recommend Dr. Rockey Situ ,Dr. Charlestine Night,  Dr Fletcher Anon and Dr. Saunders Revel.  They are all excellent.  ? ?Please keep upcoming follow-up with Dr. Lucky Cowboy for thoracic aortic aneurysm ? ?

## 2021-08-31 NOTE — Telephone Encounter (Signed)
Spoke to patient about notes in detail and she feel that she would like a second opinion outside of [facility]. She stated that she would contact us when she found another Pulmonologist, and Cardiologist. ?

## 2021-09-01 NOTE — Telephone Encounter (Signed)
noted 

## 2021-09-07 ENCOUNTER — Ambulatory Visit (INDEPENDENT_AMBULATORY_CARE_PROVIDER_SITE_OTHER): Payer: Medicare Other

## 2021-09-07 ENCOUNTER — Ambulatory Visit (INDEPENDENT_AMBULATORY_CARE_PROVIDER_SITE_OTHER): Payer: Medicare Other | Admitting: Vascular Surgery

## 2021-09-07 DIAGNOSIS — I6523 Occlusion and stenosis of bilateral carotid arteries: Secondary | ICD-10-CM | POA: Diagnosis not present

## 2021-10-05 ENCOUNTER — Ambulatory Visit (INDEPENDENT_AMBULATORY_CARE_PROVIDER_SITE_OTHER): Payer: Medicare Other | Admitting: Vascular Surgery

## 2021-10-05 ENCOUNTER — Encounter (INDEPENDENT_AMBULATORY_CARE_PROVIDER_SITE_OTHER): Payer: Self-pay | Admitting: Vascular Surgery

## 2021-10-05 ENCOUNTER — Other Ambulatory Visit (INDEPENDENT_AMBULATORY_CARE_PROVIDER_SITE_OTHER): Payer: Medicare Other

## 2021-10-05 ENCOUNTER — Encounter (INDEPENDENT_AMBULATORY_CARE_PROVIDER_SITE_OTHER): Payer: Medicare Other

## 2021-10-05 ENCOUNTER — Ambulatory Visit (INDEPENDENT_AMBULATORY_CARE_PROVIDER_SITE_OTHER): Payer: Medicare Other

## 2021-10-05 VITALS — BP 124/86 | HR 89 | Resp 16 | Ht 66.0 in | Wt 163.0 lb

## 2021-10-05 DIAGNOSIS — I714 Abdominal aortic aneurysm, without rupture, unspecified: Secondary | ICD-10-CM

## 2021-10-05 DIAGNOSIS — I10 Essential (primary) hypertension: Secondary | ICD-10-CM | POA: Diagnosis not present

## 2021-10-05 DIAGNOSIS — I6523 Occlusion and stenosis of bilateral carotid arteries: Secondary | ICD-10-CM | POA: Diagnosis not present

## 2021-10-05 DIAGNOSIS — E7800 Pure hypercholesterolemia, unspecified: Secondary | ICD-10-CM

## 2021-10-05 NOTE — Assessment & Plan Note (Signed)
Carotid duplex today shows a widely patent right carotid endarterectomy and 1 to 39% left ICA stenosis.  No role for intervention.  Continue current medical regimen.  Recheck in 1 year

## 2021-10-05 NOTE — Progress Notes (Signed)
MRN : [id number]  Tracy Bell is a 71 y.o. ([birth year]) female who presents with chief complaint of No chief complaint on file. Marland Kitchen  History of Present Illness: patient returns in follow up of multiple vascular issues.  She is doing reasonably well today without complaints.  She is status post right carotid endarterectomy several years ago.  No focal neurologic symptoms. Specifically, the patient denies amaurosis fugax, speech or swallowing difficulties, or arm or leg weakness or numbness.  Carotid duplex today shows a widely patent right carotid endarterectomy and 1 to 39% left ICA stenosis. She is also followed for an abdominal aortic aneurysm.  No aneurysm related symptoms since her last visit. Specifically, the patient denies new back or abdominal pain, or signs of peripheral embolization. Duplex today did not demonstrate the same size of the aneurysm and was not as well seen appearing only a little over 3 cm.  We know this is previously been closer to 4, but it does not appear to have significantly grown.  Current Outpatient Medications  Medication Sig Dispense Refill   apixaban (ELIQUIS) 5 MG TABS tablet Take 1 tablet (5 mg total) by mouth 2 (two) times daily. 60 tablet 11   atorvastatin (LIPITOR) 80 MG tablet Take 1 tablet (80 mg total) by mouth daily. 90 tablet 3   cholecalciferol (VITAMIN D3) 25 MCG (1000 UT) tablet Take 1,000 Units by mouth daily.     diltiazem (CARDIZEM CD) 240 MG 24 hr capsule Take 1 capsule (240 mg total) by mouth daily. 90 capsule 3   losartan (COZAAR) 50 MG tablet Take 1 tablet (50 mg total) by mouth daily. 90 tablet 3   Multiple Vitamins-Minerals (CENTRUM ADULTS) TABS Take 1 tablet by mouth daily. 30 tablet 6   Tiotropium Bromide-Olodaterol (STIOLTO RESPIMAT) 2.5-2.5 MCG/ACT AERS Inhale 2 puffs into the lungs daily. 4 g 0   No current facility-administered medications for this visit.    Past Medical History:  Diagnosis Date   Anxiety    COPD (chronic  obstructive pulmonary disease) (H[facility])    Cough    Depression    Dizziness    Dyspnea    Heart murmur    Hyperlipidemia    Hypertension    Lightheadedness    Personal history of tobacco use, presenting hazards to health 08/28/2015    Past Surgical History:  Procedure Laterality Date   ABDOMINAL HYSTERECTOMY  1980   complete   BREAST BIOPSY  1970   normal   DILATION AND CURETTAGE OF UTERUS     ENDARTERECTOMY Right 07/12/2018   Procedure: ENDARTERECTOMY CAROTID;  Surgeon: Algernon Huxley, MD;  Location: [facility] ORS;  Service: Vascular;  Laterality: Right;   TUBAL LIGATION       Social History   Tobacco Use   Smoking status: Every Day    Packs/day: 2.00    Years: 60.00    Pack years: 120.00    Types: Cigarettes    Start date: 05/03/1963   Smokeless tobacco: Never   Tobacco comments:    1.5PPD 06/18/2020  Vaping Use   Vaping Use: Never used  Substance Use Topics   Alcohol use: Yes    Alcohol/week: 0.0 standard drinks    Comment: [place] occasionally   Drug use: No      Family History  Problem Relation Age of Onset   Cancer Mother        Pancreatic Cancer    Cancer Father        Stomach  Cancer    Lung cancer Sister    Cancer Sister 91       ovarian   Lung cancer Sister      No Known Allergies    REVIEW OF SYSTEMS (Negative unless checked)   Constitutional: '[]'$ Weight loss  '[]'$ Fever  '[]'$ Chills Cardiac: '[]'$ Chest pain   '[]'$ Chest pressure   '[]'$ Palpitations   '[]'$ Shortness of breath when laying flat   '[]'$ Shortness of breath at rest   '[]'$ Shortness of breath with exertion. Vascular:  '[]'$ Pain in legs with walking   '[]'$ Pain in legs at rest   '[]'$ Pain in legs when laying flat   '[]'$ Claudication   '[]'$ Pain in feet when walking  '[]'$ Pain in feet at rest  '[]'$ Pain in feet when laying flat   '[]'$ History of DVT   '[]'$ Phlebitis   '[]'$ Swelling in legs   '[]'$ Varicose veins   '[]'$ Non-healing ulcers Pulmonary:   '[]'$ Uses home oxygen   '[]'$ Productive cough   '[]'$ Hemoptysis   '[]'$ Wheeze  '[x]'$ COPD   '[]'$ Asthma Neurologic:   '[x]'$ Dizziness  '[]'$ Blackouts   '[]'$ Seizures   '[]'$ History of stroke   '[]'$ History of TIA  '[]'$ Aphasia   '[]'$ Temporary blindness   '[]'$ Dysphagia   '[]'$ Weakness or numbness in arms   '[]'$ Weakness or numbness in legs Musculoskeletal:  '[x]'$ Arthritis   '[]'$ Joint swelling   '[]'$ Joint pain   '[]'$ Low back pain Hematologic:  '[]'$ Easy bruising  '[]'$ Easy bleeding   '[]'$ Hypercoagulable state   '[]'$ Anemic   Gastrointestinal:  '[]'$ Blood in stool   '[]'$ Vomiting blood  '[]'$ Gastroesophageal reflux/heartburn   '[]'$ Abdominal pain Genitourinary:  '[]'$ Chronic kidney disease   '[]'$ Difficult urination  '[]'$ Frequent urination  '[]'$ Burning with urination   '[]'$ Hematuria Skin:  '[]'$ Rashes   '[]'$ Ulcers   '[]'$ Wounds Psychological:  '[x]'$ History of anxiety   '[x]'$  History of major depression.  Physical Examination  Vitals:   10/05/21 1130  BP: 124/86  Pulse: 89  Resp: 16  Weight: 163 lb (73.9 kg)  Height: '5\' 6"'$  (1.676 m)   Body mass index is 26.31 kg/m. Gen:  WD/WN, NAD Head: [place]/AT, No temporalis wasting. Ear/Nose/Throat: Hearing grossly intact, nares w/o erythema or drainage, trachea midline Eyes: Conjunctiva clear. Sclera non-icteric Neck: Supple.  No bruit  Pulmonary:  Good air movement, equal and clear to auscultation bilaterally.  Cardiac: RRR, No JVD Vascular:  Vessel Right Left  Radial Palpable Palpable           Musculoskeletal: M/S 5/5 throughout.  No deformity or atrophy.  No edema. Neurologic: CN 2-12 intact. Sensation grossly intact in extremities.  Symmetrical.  Speech is fluent. Motor exam as listed above. Psychiatric: Judgment intact, Mood & affect appropriate for pt's clinical situation. Dermatologic: No rashes or ulcers noted.  No cellulitis or open wounds.     CBC Lab Results  Component Value Date   WBC 8.1 01/17/2020   HGB 15.2 (H) 01/17/2020   HCT 44.7 01/17/2020   MCV 91.9 01/17/2020   PLT 210.0 01/17/2020    BMET    Component Value Date/Time   NA 140 07/12/2021 1400   K 4.3 07/12/2021 1400   CL 103 07/12/2021 1400   CO2 31  07/12/2021 1400   GLUCOSE 93 07/12/2021 1400   BUN 18 07/12/2021 1400   CREATININE 1.14 07/12/2021 1400   CALCIUM 9.3 07/12/2021 1400   GFRNONAA >60 07/13/2018 0425   GFRAA >60 07/13/2018 0425   CrCl cannot be calculated (Patient's most recent lab result is older than the maximum 21 days allowed.).  COAG Lab Results  Component Value Date   INR 1.0 07/03/2018  Radiology VAS US CAROTID  Result Date: 09/13/2021 Carotid Arterial Duplex Study Patient Name:  Tracy Bell  Date of Exam:   09/07/2021 Medical Rec #: [id number]         Accession #:    [id number] Date of Birth: 22-Nov-1950         Patient Gender: F Patient Age:   9 years Exam Location:  [facility] Procedure:      VAS US CAROTID Referring Phys: Leotis Pain --------------------------------------------------------------------------------  Indications:       Carotid artery disease. Other Factors:     07/12/2018: Rt Car [place]. Comparison Study:  10/27/2020 Performing Technologist: Almira Coaster RVS  Examination Guidelines: A complete evaluation includes B-mode imaging, spectral Doppler, color Doppler, and power Doppler as needed of all accessible portions of each vessel. Bilateral testing is considered an integral part of a complete examination. Limited examinations for reoccurring indications may be performed as noted.  Right Carotid Findings: +----------+--------+--------+--------+------------------+--------+           PSV cm/sEDV cm/sStenosisPlaque DescriptionComments +----------+--------+--------+--------+------------------+--------+ CCA Prox  40      13                                         +----------+--------+--------+--------+------------------+--------+ CCA Mid   47      18                                         +----------+--------+--------+--------+------------------+--------+ CCA Distal47      17                                          +----------+--------+--------+--------+------------------+--------+ ICA Prox  34      15                                         +----------+--------+--------+--------+------------------+--------+ ICA Mid   79      31                                         +----------+--------+--------+--------+------------------+--------+ ICA Distal80      31                                         +----------+--------+--------+--------+------------------+--------+ ECA       39      9                                          +----------+--------+--------+--------+------------------+--------+ +----------+--------+-------+--------+-------------------+           PSV cm/sEDV cmsDescribeArm Pressure (mmHG) +----------+--------+-------+--------+-------------------+ [id number]      0                                  +----------+--------+-------+--------+-------------------+ +---------+--------+--+--------+-+  VertebralPSV cm/s39EDV cm/s8 +---------+--------+--+--------+-+  Left Carotid Findings: +----------+--------+--------+--------+------------------+--------+           PSV cm/sEDV cm/sStenosisPlaque DescriptionComments +----------+--------+--------+--------+------------------+--------+ CCA Prox  57      22                                         +----------+--------+--------+--------+------------------+--------+ CCA Mid   62      19                                         +----------+--------+--------+--------+------------------+--------+ CCA Distal53      21                                         +----------+--------+--------+--------+------------------+--------+ ICA Prox  36      10                                         +----------+--------+--------+--------+------------------+--------+ ICA Mid   95      31                                         +----------+--------+--------+--------+------------------+--------+ ICA Distal87      27                                          +----------+--------+--------+--------+------------------+--------+ ECA       49      17                                         +----------+--------+--------+--------+------------------+--------+ +----------+--------+--------+--------+-------------------+           PSV cm/sEDV cm/sDescribeArm Pressure (mmHG) +----------+--------+--------+--------+-------------------+ Subclavian120     0                                   +----------+--------+--------+--------+-------------------+ +---------+--------+--+--------+--+ VertebralPSV cm/s32EDV cm/s13 +---------+--------+--+--------+--+   Summary: Right Carotid: Velocities in the right ICA are consistent with a 1-39% stenosis. Left Carotid: Velocities in the left ICA are consistent with a 1-39% stenosis. Vertebrals:  Bilateral vertebral arteries demonstrate antegrade flow. Subclavians: Normal flow hemodynamics were seen in bilateral subclavian              arteries. *See table(s) above for measurements and observations.  Electronically signed by Leotis Pain MD on 09/13/2021 at 2:02:46 PM.    Final      Assessment/Plan Essential hypertension blood pressure control important in reducing the progression of atherosclerotic disease. On appropriate oral medications.     Tobacco use disorder Represents an atherosclerotic risk factor for progression of her carotid disease as well as aneurysmal growth.  Abdominal aneurysm ([facility]CC) Duplex today did not demonstrate the same size of the aneurysm and was not as well seen appearing only a  little over 3 cm.  We know this is previously been closer to 4, but it does not appear to have significantly grown.  We will check this in 1 year with duplex.  Carotid stenosis, bilateral Carotid duplex today shows a widely patent right carotid endarterectomy and 1 to 39% left ICA stenosis.  No role for intervention.  Continue current medical regimen.  Recheck in 1  year    Leotis Pain, MD  10/05/2021 4:47 PM    This note was created with Dragon medical transcription system.  Any errors from dictation are purely unintentional

## 2021-10-05 NOTE — Assessment & Plan Note (Signed)
Duplex today did not demonstrate the same size of the aneurysm and was not as well seen appearing only a little over 3 cm.  We know this is previously been closer to 4, but it does not appear to have significantly grown.  We will check this in 1 year with duplex.

## 2021-10-26 ENCOUNTER — Ambulatory Visit (INDEPENDENT_AMBULATORY_CARE_PROVIDER_SITE_OTHER): Payer: Medicare Other | Admitting: Vascular Surgery

## 2021-10-26 ENCOUNTER — Encounter (INDEPENDENT_AMBULATORY_CARE_PROVIDER_SITE_OTHER): Payer: Medicare Other

## 2021-10-26 NOTE — Progress Notes (Signed)
Subjective:    Patient ID: Tracy Bell, female    DOB: 09/01/1950, 71 y.o.   MRN: [id number]  CC: Tracy Bell is a 71 y.o. female who presents today for follow up.   HPI: Feels well today No new complaints    Coronary atherosclerosis-compliant with atorvastatin 80 mg  Hypertension-compliant with losartan 50 mg SVT-compliant with diltiazem 240 mg, Eliquis 5 mg twice daily.  She is yet to establish with cardiology.  No further recurrence of palpitations.  Denies chest pain, dizziness  COPD- compliant with stiolto. She is smoking. SOB is at baseline but she feels there has been a 'gradual' progression.  She is able to take care of her animals. She would like to make her own pulmonology appointment.    Follow up with Dr. Lucky Cowboy 10/05/2021 for abdominal aneurysm, carotid stenosis HISTORY:  Past Medical History:  Diagnosis Date   Anxiety    COPD (chronic obstructive pulmonary disease) (HCC)    Cough    Depression    Dizziness    Dyspnea    Heart murmur    Hyperlipidemia    Hypertension    Lightheadedness    Personal history of tobacco use, presenting hazards to health 08/28/2015   Past Surgical History:  Procedure Laterality Date   ABDOMINAL HYSTERECTOMY  1980   complete   BREAST BIOPSY  1970   normal   DILATION AND CURETTAGE OF UTERUS     ENDARTERECTOMY Right 07/12/2018   Procedure: ENDARTERECTOMY CAROTID;  Surgeon: Algernon Huxley, MD;  Location: [facility] ORS;  Service: Vascular;  Laterality: Right;   TUBAL LIGATION     Family History  Problem Relation Age of Onset   Cancer Mother        Pancreatic Cancer    Cancer Father        Stomach Cancer    Lung cancer Sister    Cancer Sister 25       ovarian   Lung cancer Sister     Allergies: Patient has no known allergies. Current Outpatient Medications on File Prior to Visit  Medication Sig Dispense Refill   apixaban (ELIQUIS) 5 MG TABS tablet Take 1 tablet (5 mg total) by mouth 2 (two) times daily. 60 tablet 11    atorvastatin (LIPITOR) 80 MG tablet Take 1 tablet (80 mg total) by mouth daily. 90 tablet 3   cholecalciferol (VITAMIN D3) 25 MCG (1000 UT) tablet Take 1,000 Units by mouth daily.     diltiazem (CARDIZEM CD) 240 MG 24 hr capsule Take 1 capsule (240 mg total) by mouth daily. 90 capsule 3   losartan (COZAAR) 50 MG tablet Take 1 tablet (50 mg total) by mouth daily. 90 tablet 3   Multiple Vitamins-Minerals (CENTRUM ADULTS) TABS Take 1 tablet by mouth daily. 30 tablet 6   Tiotropium Bromide-Olodaterol (STIOLTO RESPIMAT) 2.5-2.5 MCG/ACT AERS Inhale 2 puffs into the lungs daily. 4 g 0   No current facility-administered medications on file prior to visit.    Social History   Tobacco Use   Smoking status: Every Day    Packs/day: 2.00    Years: 60.00    Total pack years: 120.00    Types: Cigarettes    Start date: 05/03/1963   Smokeless tobacco: Never   Tobacco comments:    1.5PPD 06/18/2020  Vaping Use   Vaping Use: Never used  Substance Use Topics   Alcohol use: Yes    Alcohol/week: 0.0 standard drinks of alcohol    Comment: [place]  [place] occasionally   Drug use: No    Review of Systems  Constitutional:  Negative for chills and fever.  Respiratory:  Positive for shortness of breath. Negative for cough.   Cardiovascular:  Negative for chest pain, palpitations and leg swelling.  Gastrointestinal:  Negative for nausea and vomiting.      Objective:    BP 128/78 (BP Location: Left Arm, Patient Position: Sitting, Cuff Size: Normal)   Pulse (!) 105   Temp 98 F (36.7 C) (Oral)   Ht '5\' 5"'$  (1.651 m)   Wt 162 lb 12.8 oz (73.8 kg)   SpO2 96%   BMI 27.09 kg/m  BP Readings from Last 3 Encounters:  10/29/21 128/78  10/05/21 124/86  07/14/21 126/78   Wt Readings from Last 3 Encounters:  10/29/21 162 lb 12.8 oz (73.8 kg)  10/05/21 163 lb (73.9 kg)  07/14/21 167 lb 6.4 oz (75.9 kg)    Physical Exam Vitals reviewed.  Constitutional:      Appearance: She is well-developed.  Eyes:      Conjunctiva/sclera: Conjunctivae normal.  Cardiovascular:     Rate and Rhythm: Normal rate and regular rhythm.     Pulses: Normal pulses.     Heart sounds: Normal heart sounds.  Pulmonary:     Effort: Pulmonary effort is normal.     Breath sounds: Normal breath sounds. No wheezing, rhonchi or rales.  Musculoskeletal:     Right lower leg: No edema.     Left lower leg: No edema.  Skin:    General: Skin is warm and dry.  Neurological:     Mental Status: She is alert.  Psychiatric:        Speech: Speech normal.        Behavior: Behavior normal.        Thought Content: Thought content normal.        Assessment & Plan:   Problem List Items Addressed This Visit       Cardiovascular and Mediastinum   Atherosclerosis of aorta (HCC)    Chronic, symptomatically stable.  Continue atorvastatin 80 mg      SVT (supraventricular tachycardia) (HCC)    Chronic, symptomatically stable.  Continue diltiazem 240 mg, Eliquis 5 mg twice daily.   I advised patient to reestablish cardiology. She will call to schedule an appointment.         Respiratory   Stage 2 moderate COPD by GOLD classification (HCC)    Chronic, slow progression as patient described today.  She remains compliant with Stiolto.  She would like to make her own pulmonology appointment and will let me know who is in her insurance network.        I am having [name] maintain her Centrum Adults, cholecalciferol, Stiolto Respimat, diltiazem, atorvastatin, apixaban, and losartan.   No orders of the defined types were placed in this encounter.   Return precautions given.   Risks, benefits, and alternatives of the medications and treatment plan prescribed today were discussed, and patient expressed understanding.   Education regarding symptom management and diagnosis given to patient on AVS.  Continue to follow with Burnard Hawthorne, FNP for routine health maintenance.   Anjelina M Jamil and I agreed with  plan.   [name], FNP

## 2021-10-29 ENCOUNTER — Ambulatory Visit (INDEPENDENT_AMBULATORY_CARE_PROVIDER_SITE_OTHER): Payer: Medicare Other | Admitting: Family

## 2021-10-29 ENCOUNTER — Encounter: Payer: Self-pay | Admitting: Family

## 2021-10-29 DIAGNOSIS — I471 Supraventricular tachycardia, unspecified: Secondary | ICD-10-CM

## 2021-10-29 DIAGNOSIS — I700 Atherosclerosis of aorta: Secondary | ICD-10-CM | POA: Diagnosis not present

## 2021-10-29 DIAGNOSIS — J449 Chronic obstructive pulmonary disease, unspecified: Secondary | ICD-10-CM | POA: Diagnosis not present

## 2021-10-29 NOTE — Assessment & Plan Note (Signed)
Chronic, slow progression as patient described today.  She remains compliant with Stiolto.  She would like to make her own pulmonology appointment and will let me know who is in her insurance network.

## 2021-10-29 NOTE — Patient Instructions (Addendum)
Please send me names of pulmonologist on your insurance company.   I would recommend Dr Rockey Situ with [facility].   Nice to see you!

## 2021-10-29 NOTE — Assessment & Plan Note (Signed)
Chronic, symptomatically stable.  Continue diltiazem 240 mg, Eliquis 5 mg twice daily.   I advised patient to reestablish cardiology. She will call to schedule an appointment.

## 2021-10-29 NOTE — Assessment & Plan Note (Signed)
Chronic, symptomatically stable.  Continue atorvastatin 80 mg

## 2021-12-21 ENCOUNTER — Other Ambulatory Visit: Payer: Self-pay | Admitting: Family

## 2021-12-21 MED ORDER — STIOLTO RESPIMAT 2.5-2.5 MCG/ACT IN AERS: 2.0000 | INHALATION_SPRAY | Freq: Every day | RESPIRATORY_TRACT | 3 refills | Status: DC

## 2021-12-21 NOTE — Progress Notes (Signed)
close

## 2021-12-26 ENCOUNTER — Encounter: Payer: Self-pay | Admitting: Family

## 2021-12-27 ENCOUNTER — Other Ambulatory Visit: Payer: Self-pay

## 2021-12-27 MED ORDER — ATORVASTATIN CALCIUM 80 MG PO TABS: 80.0000 mg | ORAL_TABLET | Freq: Every day | ORAL | 2 refills | Status: DC

## 2021-12-29 ENCOUNTER — Encounter: Payer: Self-pay | Admitting: Family

## 2021-12-30 ENCOUNTER — Other Ambulatory Visit: Payer: Self-pay

## 2021-12-30 DIAGNOSIS — J449 Chronic obstructive pulmonary disease, unspecified: Secondary | ICD-10-CM

## 2022-01-23 ENCOUNTER — Other Ambulatory Visit: Payer: Self-pay | Admitting: Family

## 2022-02-21 ENCOUNTER — Telehealth: Payer: Self-pay

## 2022-02-21 NOTE — Progress Notes (Signed)
[facility]) Care Management  [place]   02/21/2022  Tracy Bell 1950/08/23 [id number]   2024 Medication Assistance Renewal Application Summary:  Patient was outreached by [place] Team regarding medication assistance renewal for 2024. Verified address, anticipated insurance for 2024, and income has not changed. Patient renames interested in PAP for 2024 for Stiolto, no other new medications were identified for medication assistance.    Plan: I will route patient assistance letter to [place] technician who will coordinate patient assistance program application process for medications listed above.  [facility] pharmacy technician will assist with obtaining all required documents from both patient and provider(s) and submit application(s) once completed.    Thank you for allowing pharmacy to be a part of this patient's care.    [name], PharmD Clinical Pharmacist [place] Cell: [phone]

## 2022-02-24 DIAGNOSIS — R0602 Shortness of breath: Secondary | ICD-10-CM | POA: Diagnosis not present

## 2022-02-28 ENCOUNTER — Encounter (INDEPENDENT_AMBULATORY_CARE_PROVIDER_SITE_OTHER): Payer: Self-pay

## 2022-02-28 ENCOUNTER — Telehealth: Payer: Self-pay | Admitting: Pharmacy Technician

## 2022-02-28 DIAGNOSIS — Z596 Low income: Secondary | ICD-10-CM

## 2022-02-28 NOTE — Progress Notes (Signed)
[facility])                                            [place] Team    02/28/2022  Tracy Bell 1950/05/14 [id number]                                      Medication Assistance Referral-FOR 2024 RE ENROLLMENT  Referral From:  [facility] R[facility]  [name]  Medication/Company: [name] / [employer] Patient application portion:  Mailed Provider application portion: Faxed  to [name], FNP Provider address/fax verified via: Office website    Brandie Lopes P. Alisyn Lequire, [place]  [phone]

## 2022-03-02 ENCOUNTER — Ambulatory Visit: Payer: Medicare Other | Admitting: Dermatology

## 2022-04-08 ENCOUNTER — Telehealth: Payer: Self-pay | Admitting: Pharmacy Technician

## 2022-04-08 DIAGNOSIS — Z596 Low income: Secondary | ICD-10-CM

## 2022-04-08 NOTE — Progress Notes (Signed)
[facility])                                            [place] Team    04/08/2022  Tracy Bell 1950/11/27 [id number]  Received both patient and provider portion(s) of patient assistance application(s) for Stiolto. Faxed completed application and required documents into [employer].    Graeme Menees P. Kalli Greenfield, [place]  [phone]

## 2022-04-11 ENCOUNTER — Ambulatory Visit: Payer: Medicare Other | Admitting: Family

## 2022-05-04 ENCOUNTER — Ambulatory Visit (INDEPENDENT_AMBULATORY_CARE_PROVIDER_SITE_OTHER): Payer: Medicare Other | Admitting: Family

## 2022-05-04 ENCOUNTER — Encounter: Payer: Self-pay | Admitting: Family

## 2022-05-04 VITALS — BP 138/82 | HR 94 | Temp 98.0°F | Ht 66.0 in | Wt 164.0 lb

## 2022-05-04 DIAGNOSIS — I471 Supraventricular tachycardia, unspecified: Secondary | ICD-10-CM

## 2022-05-04 DIAGNOSIS — I10 Essential (primary) hypertension: Secondary | ICD-10-CM

## 2022-05-04 DIAGNOSIS — R42 Dizziness and giddiness: Secondary | ICD-10-CM | POA: Diagnosis not present

## 2022-05-04 NOTE — Progress Notes (Signed)
Assessment & Plan:  Dizziness Assessment & Plan: She is not orthostatic based on flow sheet. EKG nonspecific T wave changes, atrial fibrillation.EKG faxed to [facility].  Consulted with Tracy Bell via secure chat  to review EKG.   Appointment made with Tracy Faith PA tomorrow at 8 AM which per Tracy. Rockey Bell is appropriate.  Rate is well-controlled.  Patient is compliant with Eliquis, diltiazem and she is understands importance of seeing cardiology ASAP as scheduled tomorrow.   Encouraged her to remain very careful when changing positions.   Orders: -     EKG 12-Lead -     Ambulatory referral to Cardiology  SVT (supraventricular tachycardia) -     ECHOCARDIOGRAM COMPLETE; Future  Essential hypertension Assessment & Plan: Chronic, stable.  Continue losartan 50 mg, diltiazem '240mg'$  QD.       Return precautions given.   Risks, benefits, and alternatives of the medications and treatment plan prescribed today were discussed, and patient expressed understanding.   Education regarding symptom management and diagnosis given to patient on AVS either electronically or printed.  No follow-ups on file.  Tracy Paris, FNP  Subjective:    Patient ID: Tracy Bell, female    DOB: 24-Jul-1950, 72 y.o.   MRN: [id number]  CC: Tracy Bell is a 72 y.o. female who presents today for follow up.   HPI: Complains of sensation of feeling 'cloudy' , first episode 3 weeks ago and since , she has had an episode daily She describes self limiting episode of dizziness. This only happens when she first gets up from sleep.  When she arises from sofa after a nap, no episode.  No associated syncope, n, vertigo, congestion, cp, palpitations, HA, vision changes.  She is staying with active with animals at home.  Drinking lots of water.    Upcoming appointment with pulmonology, Tracy Bell  History of SVT, atrial flutter, COPD, aortic stenosis, AAA and tobacco use  Last visit with  cardiology 7/26 2022.  She remains compliant with Eliquis 5 mg twice daily.  Today she reports being in the donut hole as it relates to cost for Eliquis.  Previously declined sleep study, EP referral  Echocardiogram 06/2019 Allergies: Patient has no known allergies. Current Outpatient Medications on File Prior to Visit  Medication Sig Dispense Refill   apixaban (ELIQUIS) 5 MG TABS tablet Take 1 tablet (5 mg total) by mouth 2 (two) times daily. 60 tablet 11   atorvastatin (LIPITOR) 80 MG tablet Take 1 tablet (80 mg total) by mouth daily. 90 tablet 2   cholecalciferol (VITAMIN D3) 25 MCG (1000 UT) tablet Take 1,000 Units by mouth daily.     diltiazem (CARDIZEM CD) 240 MG 24 hr capsule TAKE 1 CAPSULE BY MOUTH EVERY DAY 90 capsule 3   losartan (COZAAR) 50 MG tablet Take 1 tablet (50 mg total) by mouth daily. 90 tablet 3   Multiple Vitamins-Minerals (CENTRUM ADULTS) TABS Take 1 tablet by mouth daily. 30 tablet 6   Tiotropium Bromide-Olodaterol (STIOLTO RESPIMAT) 2.5-2.5 MCG/ACT AERS Inhale 2 puffs into the lungs daily. 4 g 3   No current facility-administered medications on file prior to visit.    Review of Systems  Constitutional:  Negative for chills and fever.  Respiratory:  Negative for cough.   Cardiovascular:  Negative for chest pain and palpitations.  Gastrointestinal:  Negative for nausea and vomiting.  Neurological:  Positive for dizziness.      Objective:    BP 138/82   Pulse  94   Temp 98 F (36.7 C) (Oral)   Ht '5\' 6"'$  (1.676 m)   Wt 164 lb (74.4 kg)   SpO2 97%   BMI 26.47 kg/m  BP Readings from Last 3 Encounters:  05/04/22 138/82  10/29/21 128/78  10/05/21 124/86   Wt Readings from Last 3 Encounters:  05/04/22 164 lb (74.4 kg)  10/29/21 162 lb 12.8 oz (73.8 kg)  10/05/21 163 lb (73.9 kg)  Orthostatic VS for the past 24 hrs (Last 3 readings):  BP- Lying Pulse- Lying BP- Sitting Pulse- Sitting BP- Standing at 0 minutes Pulse- Standing at 0 minutes  05/04/22 1345  140/78 72 136/76 67 134/70 98     Physical Exam Vitals reviewed.  Constitutional:      Appearance: She is well-developed.  Eyes:     Conjunctiva/sclera: Conjunctivae normal.  Cardiovascular:     Rate and Rhythm: Normal rate and regular rhythm.     Pulses: Normal pulses.     Heart sounds: Normal heart sounds.  Pulmonary:     Effort: Pulmonary effort is normal.     Breath sounds: Normal breath sounds. No wheezing, rhonchi or rales.  Skin:    General: Skin is warm and dry.  Neurological:     Mental Status: She is alert.  Psychiatric:        Speech: Speech normal.        Behavior: Behavior normal.        Thought Content: Thought content normal.    I have spent 25 minutes with a patient including precharting, exam, reviewing medical records, and discussion plan of care.

## 2022-05-04 NOTE — Progress Notes (Unsigned)
Cardiology Office Note:    Date:  05/05/2022   ID:  Tracy, Bell [birth year], MRN [id number]  PCP:  Burnard Hawthorne, [place] Providers Cardiologist:  Kathlyn Sacramento, MD Cardiology APP:  Arvil Chaco, PA-C (Inactive)     Referring MD: Burnard Hawthorne, FNP   Chief complaint: dizziness  History of Present Illness:    Tracy Bell is a 72 y.o. 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  Last seen in our office with [name] PA on 11/24/20, doing ok but having issues affording her Eliquis and frequently forgetting her second dose.   Was evaluated by her PCP on 05/04/22 for new onset of dizziness. Workup revealed she was in AF, rate controlled, and we were asked to see her for further workup.  She presents today with complaints of "feeling like her head is an egg timer". This first started on 04/13/22, she woke up in the night to use the restroom and had this sensation in her head. She advised this sensation has occurred most nights since the 13th, she says this sensation has progressed to "feeling like her head is in a washing machine". Again, she only noticed this sensation at night when she gets up to use the restroom. She has not had this sensation at all for the last two days. She does endorse a generalized "fogginess" and just feels not as "sharp" as she used to. She denies any presyncope or syncope. She denies chest pain, palpitations, dyspnea, pnd, orthopnea, n, v, weight gain, or early satiety.  She endorses pedal edema, better today than it was yesterday. She frequently eats processed foods and frozen dinner meals. Discussed reducing her sodium intake and elevating her feet to help with her pedal edema.   She states she takes her  medications as prescribed, but then "sometimes" forgets her second dose of Eliquis. When pressed, she states she maybe forgets 5 times/month to take her second dose. Discussed alternatives to Eliquis (Xarelto, coumadin), she is adamant she does not want to change from Eliquis. We discussed the imperative nature of taking her Eliquis twice per day and the deleterious effects from skipping her second dose. She states she did not realize how important it was to take it twice/day and she will make a more concerted effort to take as prescribed. She denies hematochezia, hematuria, hemoptysis. She continues to smoke 1 ppd, and has not intentions of stopping, states the last few times she tried to stop smoking she had "episodes" of presyncope but since she has decided to continue to smoke, these "episodes" have subsided.    Past Medical History:  Diagnosis Date   Anxiety    COPD (chronic obstructive pulmonary disease) (HCC)    Cough    Depression    Dizziness    Dyspnea    Heart murmur    Hyperlipidemia    Hypertension    Lightheadedness    Personal history of tobacco use, presenting hazards to health 08/28/2015    Past Surgical History:  Procedure Laterality Date   ABDOMINAL HYSTERECTOMY  1980   complete   BREAST BIOPSY  1970   normal   DILATION AND CURETTAGE OF UTERUS     ENDARTERECTOMY Right 07/12/2018   Procedure: ENDARTERECTOMY CAROTID;  Surgeon: Algernon Huxley, MD;  Location: [facility] ORS;  Service: Vascular;  Laterality: Right;   TUBAL LIGATION      Current Medications: Current Meds  Medication Sig   apixaban (ELIQUIS) 5 MG TABS tablet Take 1 tablet (5 mg total) by mouth 2 (two) times daily.   atorvastatin (LIPITOR) 80 MG tablet Take 1 tablet (80 mg total) by mouth daily.   cholecalciferol (VITAMIN D3) 25 MCG (1000 UT) tablet Take 1,000 Units by mouth daily.   diltiazem (CARDIZEM CD) 240 MG 24 hr capsule TAKE 1 CAPSULE BY MOUTH EVERY DAY   losartan (COZAAR) 50 MG tablet Take 1 tablet (50  mg total) by mouth daily.   Multiple Vitamins-Minerals (CENTRUM ADULTS) TABS Take 1 tablet by mouth daily.   Tiotropium Bromide-Olodaterol (STIOLTO RESPIMAT) 2.5-2.5 MCG/ACT AERS Inhale 2 puffs into the lungs daily.     Allergies:   Patient has no known allergies.   Social History   Socioeconomic History   Marital status: Widowed    Spouse name: Not on file   Number of children: Not on file   Years of education: Not on file   Highest education level: Not on file  Occupational History   Occupation: retired  Tobacco Use   Smoking status: Every Day    Packs/day: 2.00    Years: 60.00    Total pack years: 120.00    Types: Cigarettes    Start date: 05/03/1963   Smokeless tobacco: Never   Tobacco comments:    1 PPD: 02/20/2022  Vaping Use   Vaping Use: Never used  Substance and Sexual Activity   Alcohol use: Yes    Alcohol/week: 0.0 standard drinks of alcohol    Comment: [place] occasionally   Drug use: No   Sexual activity: Not on file  Other Topics Concern   Not on file  Social History Narrative   Lives with husband. Has 11 pets in home. Has one son, lives locally.   Was born and raised in [place].   Husband is older than her and she provides care for him.      Work - retired. Invented water bottle for pets   Social Determinants of Health   Financial Resource Strain: Medium Risk (04/13/2021)   Overall Financial Resource Strain (CARDIA)    Difficulty of Paying Living Expenses: Somewhat hard  Food Insecurity: Not on file  Transportation Needs: Not on file  Physical Activity: Not on file  Stress: Not on file  Social Connections: Not on file     Family History: The patient's family history includes Cancer in her father and mother; Cancer (age of onset: 27) in her sister; Lung cancer in her sister and sister.  ROS:   Review of Systems  Constitutional: Negative.   HENT:  Negative for nosebleeds.   Eyes:  Negative for blurred vision and double vision.   Respiratory:  Positive for cough. Negative for hemoptysis, sputum production, shortness of breath and wheezing.   Cardiovascular:  Positive for leg swelling. Negative for chest pain, palpitations, orthopnea, claudication and PND.  Gastrointestinal:  Negative for blood in stool and melena.  Genitourinary:  Negative for hematuria.  Musculoskeletal: Negative.  Negative for falls.  Skin: Negative.   Neurological:  Positive for dizziness. Negative for tingling, loss of consciousness and headaches.  Endo/Heme/Allergies:  Bruises/bleeds easily.  Psychiatric/Behavioral: Negative.       EKGs/Labs/Other Studies Reviewed:    The following studies were reviewed today:  10/05/21 AAA duplex -  Mild dilitation of the distal aorta compared to the mid dimensions with  moderate atherosclerosis throughout. The proximal  abdominal aorta shows dimensions above 3cm but does not appear aneurysmal  09/07/21 Carotid doppler - R carotid 1-39% stenosis, L carotid 1-39% stenosis  06/04/21 echo complete - EF 60-65%, borderline LVH, grade II diastolic dysfunction, no RWMA. Mild TVR. AV mild > mod stenosis. Mildly elevated PA pressures (32).   06/21/19 14 day ZIO - Normal sinus rhythm with an average heart rate of 65 bpm. Sinus bradycardia during early morning hours with lowest heart rate of 43 bpm. Frequent episodes of SVT with a total of 100 runs.  The longest run lasted 50 minutes and 54 seconds. Intermittent Mobitz 1 second-degree AV block and early morning hours. Most triggered events correlated with SVT.     EKG:  EKG is ordered today.  The ekg ordered today demonstrates atrial fibrillation, HR 83 bpm.   Recent Labs: 07/12/2021: ALT 21; BUN 18; Creatinine, Ser 1.14; Potassium 4.3; Sodium 140; TSH 6.71  Recent Lipid Panel    Component Value Date/Time   CHOL 137 07/12/2021 1400   TRIG 92.0 07/12/2021 1400   HDL 44.70 07/12/2021 1400   CHOLHDL 3 07/12/2021 1400   VLDL 18.4 07/12/2021 1400   LDLCALC 74  07/12/2021 1400     Risk Assessment/Calculations:    CHA2DS2-VASc Score = 3   This indicates a 3.2% annual risk of stroke. The patient's score is based upon: CHF History: 0 HTN History: 1 Diabetes History: 0 Stroke History: 0 Vascular Disease History: 0 Age Score: 1 Gender Score: 1     HYPERTENSION CONTROL Vitals:   05/05/22 0800  BP: (!) 124/90    The patient's blood pressure is elevated above target today.  In order to address the patient's elevated BP: Blood pressure will be monitored at home to determine if medication changes need to be made.            Physical Exam:    VS:  BP (!) 124/90 (BP Location: Left Arm, Patient Position: Sitting, Cuff Size: Normal)   Pulse 83   Ht '5\' 6"'$  (1.676 m)   Wt 165 lb (74.8 kg)   SpO2 93%   BMI 26.63 kg/m     Wt Readings from Last 3 Encounters:  05/05/22 165 lb (74.8 kg)  05/04/22 164 lb (74.4 kg)  10/29/21 162 lb 12.8 oz (73.8 kg)     GEN:  Well nourished, well developed in no acute distress HEENT: Normal NECK: No JVD; No carotid bruits LYMPHATICS: No lymphadenopathy CARDIAC: irregular rhythm, 2/6 murmur noted right and left sternal border, no rubs,  no gallops RESPIRATORY:  Clear to auscultation without rales, wheezing or rhonchi  ABDOMEN: Soft, non-tender, non-distended MUSCULOSKELETAL:  +1 edema mid tibia; No deformity  SKIN: Warm and dry NEUROLOGIC:  Alert and oriented x 3 PSYCHIATRIC:  Normal affect   ASSESSMENT:    1. Paroxysmal atrial fibrillation (HCC)   2. Dizziness   3. Coronary artery calcification seen on CT scan   4. Aortic stenosis, moderate   5. History of Mobitz type I block   6. Chronic anticoagulation   7. Ectatic aorta ([place])   8. Essential hypertension   9. Hyperlipidemia LDL goal <70    PLAN:    In order of problems listed above:  PAF - CHA2DS2-VASc Score = 3 [CHF History: 0, HTN History: 1, Diabetes History: 0, Stroke History: 0, Vascular Disease History: 0, Age Score: 1, Gender  Score: 1].  Therefore, the patient's annual risk of stroke is 3.2 %. Currently on  Eliquis 5 mg twice daily (no indication for dose reduction), cardizem 240 mg daily. She has missed several doses of her Eliquis, but wishes to remain on it. Concerns with affording Eliquis, will assist her with patient assistance program. Will arrange 14 day live Zio monitor. Check CBC, BMET, TSH to rule out any organic causes. Will order Echo complete.  Dizziness - Relatively persistent sensations associated with dizziness during the night, although absent for the last few nights. MRI w/o to rule out any infarcts. Orthostatic v/s were negative in the office. Will check CBC to r/o anemia. Coronary artery calcification - Noted on AAA duplex. Continue lipitor 80 mg daily, LDL on 07/12/21 was 74. Denies any angina or acute decompensation.  Aortic stenosis - Asymptomatic. Echo ordered.  History of Mobitz type I - Seen on previous Zio monitor ~ 5 am. 14 day live Zio ordered.  Chronic anticoagulation - Continue Eliquis 5 mg twice day (no indication for dose reduction), will assist with patient assistance as she states it is too expensive for her.  Ectatic aorta - She follows with [facility]. Recent duplex on 10/05/21 Mild dilitation of the distal aorta compared to the mid dimensions with moderate atherosclerosis throughout. The proximal. abdominal aorta shows dimensions above 3cm but does not appear aneurysmal.  HTN - BP 124/90, well controlled, continue Cozaar 50 mg daily, continue diltiazem 240 mg daily. HLD - Continue lipitor 80 mg daily, LDL on 07/12/21 was 74, not at desired range. Consider adding Zetia if still not in desired range at her next lipid panel ~ March 2024.            Medication Adjustments/Labs and Tests Ordered: Current medicines are reviewed at length with the patient today.  Concerns regarding medicines are outlined above.  Orders Placed This Encounter  Procedures   MR Brain Wo Contrast    CBC   Basic metabolic panel   TSH   LONG TERM MONITOR-LIVE TELEMETRY (3-14 DAYS)   EKG 12-Lead   ECHOCARDIOGRAM COMPLETE   No orders of the defined types were placed in this encounter.   Patient Instructions  Medication Instructions:  No changes at this time.   *If you need a refill on your cardiac medications before your next appointment, please call your pharmacy*   Lab Work: CBC, BMET, and TSH to be done today over at the [facility] entrance.  If you have labs (blood work) drawn today and your tests are completely normal, you will receive your results only by: [place] (if you have MyChart) OR A paper copy in the mail If you have any lab test that is abnormal or we need to change your treatment, we will call you to review the results.   Testing/Procedures:   Your physician has requested that you have an echocardiogram. Echocardiography is a painless test that uses sound waves to create images of your heart. It provides your doctor with information about the size and shape of your heart and how well your heart's chambers and valves are working. This procedure takes approximately one hour. There are no restrictions for this procedure. Please do NOT wear cologne, perfume, aftershave, or lotions (deodorant is allowed). Please arrive 15 minutes prior to your appointment time.  Your physician has recommended that you wear a Zio AT Live monitor for 14 days.   This monitor is a medical device that records the heart's electrical activity. Doctors most often use these monitors to diagnose arrhythmias. Arrhythmias are problems with the  speed or rhythm of the heartbeat. The monitor is a small device applied to your chest. You can wear one while you do your normal daily activities. While wearing this monitor if you have any symptoms to push the button and record what you felt. Once you have worn this monitor for the period of time provider prescribed (Usually 14 days), you  will return the monitor device in the postage paid box/bag. Once it is returned they will download the data collected and provide [place] with a report which the provider will then review and we will call you with those results.   Important tips:  Avoid showering during the first 24 hours of wearing the monitor. Avoid excessive sweating to help maximize wear time. Do not submerge the device, no hot tubs, and no swimming pools. Keep any lotions or oils away from the patch. After 24 hours you may shower with the patch on. Take brief showers with your back facing the shower head.  Do not remove patch once it has been placed because that will interrupt data and decrease adhesive wear time. Push the button when you have any symptoms and write down what you were feeling. Once you have completed wearing your monitor, remove and place into box which has postage paid and place in your outgoing mailbox.  If for some reason you have misplaced your box then call our office and we can provide another box and/or mail it off for you. Keep the transmitter within 10 feet at all times.  Expect a welcome phone call within 07-62 hrs of application from [place] [facility].  This call will include your copay information, so please answer any unknown phone calls while wearing Zio (it could also be important information about your heart) The envelope to return Zio is in the back of the transmitter. Removal instructions are on the last page of the symptom diary.  Place the patch sticky side up inside the transmitter and the symptom diary inside the envelope to return on your last wear day inside your mailbox or any USPS mailbox.    Follow-Up: At [facility], you and your health needs are our priority.  As part of our continuing mission to provide you with exceptional heart care, we have created designated Provider Care Teams.  These Care Teams include your primary Cardiologist (physician) and Advanced Practice Providers (APPs -   Physician Assistants and Nurse Practitioners) who all work together to provide you with the care you need, when you need it.   Your next appointment:   6 week(s)  The format for your next appointment:   In Person  Provider:   Kathlyn Sacramento, MD or Christell Faith, PA-C        Important Information About Sugar          Signed, Trudi Ida, NP  05/05/2022 11:52 AM    [place]

## 2022-05-04 NOTE — Assessment & Plan Note (Addendum)
She is not orthostatic based on flow sheet. EKG nonspecific T wave changes, atrial fibrillation.EKG faxed to [facility].  Consulted with Dr. Esmond Plants via secure chat  to review EKG.   Appointment made with Christell Faith PA tomorrow at 8 AM which per Dr. Rockey Situ is appropriate.  Rate is well-controlled.  Patient is compliant with Eliquis, diltiazem and she is understands importance of seeing cardiology ASAP as scheduled tomorrow.   Encouraged her to remain very careful when changing positions.

## 2022-05-04 NOTE — Assessment & Plan Note (Signed)
Chronic, stable.  Continue losartan 50 mg, diltiazem '240mg'$  QD.

## 2022-05-04 NOTE — Patient Instructions (Addendum)
Symptoms consistent with orthostatic hypotension as you become dizzy when laying to sitting.    However I am most concerned with the changes on your EKG showing atrial fibrillation and T wave changes  It is imperative that you keep cardiology appointment tomorrow.    Orthostatic Hypotension Blood pressure is a measurement of how strongly, or weakly, your circulating blood is pressing against the walls of your arteries. Orthostatic hypotension is a drop in blood pressure that can happen when you change positions, such as when you go from lying down to standing. Arteries are blood vessels that carry blood from your heart throughout your body. When blood pressure is too low, you may not get enough blood to your brain or to the rest of your organs. Orthostatic hypotension can cause light-headedness, sweating, rapid heartbeat, blurred vision, and fainting. These symptoms require further investigation into the cause. What are the causes? Orthostatic hypotension can be caused by many things, including: Sudden changes in posture, such as standing up quickly after you have been sitting or lying down. Loss of blood (anemia) or loss of body fluids (dehydration). Heart problems, neurologic problems, or hormone problems. Pregnancy. Aging. The risk for this condition increases as you get older. Severe infection (sepsis). Certain medicines, such as medicines for high blood pressure or medicines that make the body lose excess fluids (diuretics). What are the signs or symptoms? Symptoms of this condition may include: Weakness, light-headedness, or dizziness. Sweating. Blurred vision. Tiredness (fatigue). Rapid heartbeat. Fainting, in severe cases. How is this diagnosed? This condition is diagnosed based on: Your symptoms and medical history. Your blood pressure measurements. Your health care provider will check your blood pressure when you are: Lying down. Sitting. Standing. A blood pressure reading  is recorded as two numbers, such as "120 over 80" (or 120/80). The first ("top") number is called the systolic pressure. It is a measure of the pressure in your arteries as your heart beats. The second ("bottom") number is called the diastolic pressure. It is a measure of the pressure in your arteries when your heart relaxes between beats. Blood pressure is measured in a unit called mmHg. Healthy blood pressure for most adults is 120/80 mmHg. Orthostatic hypotension is defined as a 20 mmHg drop in systolic pressure or a 10 mmHg drop in diastolic pressure within 3 minutes of standing. Other information or tests that may be used to diagnose orthostatic hypotension include: Your other vital signs, such as your heart rate and temperature. Blood tests. An electrocardiogram (ECG) or echocardiogram. A Holter monitor. This is a device you wear that records your heart rhythm continuously, usually for 24-48 hours. Tilt table test. For this test, you will be safely secured to a table that moves you from a lying position to an upright position. Your heart rhythm and blood pressure will be monitored during the test. How is this treated? This condition may be treated by: Changing your diet. This may involve eating more salt (sodium) or drinking more water. Changing the dosage of certain medicines you are taking that might be lowering your blood pressure. Correcting the underlying reason for the orthostatic hypotension. Wearing compression stockings. Taking medicines to raise your blood pressure. Avoiding actions that trigger symptoms. Follow these instructions at home: Medicines Take over-the-counter and prescription medicines only as told by your health care provider. Follow instructions from your health care provider about changing the dosage of your current medicines, if this applies. Do not stop or adjust any of your medicines on your own.  Eating and drinking  Drink enough fluid to keep your urine pale  yellow. Eat extra salt only as directed. Do not add extra salt to your diet unless advised by your health care provider. Eat frequent, small meals. Avoid standing up suddenly after eating. General instructions  Get up slowly from lying down or sitting positions. This gives your blood pressure a chance to adjust. Avoid hot showers and excessive heat as directed by your health care provider. Engage in regular physical activity as directed by your health care provider. If you have compression stockings, wear them as told. Keep all follow-up visits. This is important. Contact a health care provider if: You have a fever for more than 2-3 days. You feel more thirsty than usual. You feel dizzy or weak. Get help right away if: You have chest pain. You have a fast or irregular heartbeat. You become sweaty or feel light-headed. You feel short of breath. You faint. You have any symptoms of a stroke. "BE FAST" is an easy way to remember the main warning signs of a stroke: B - Balance. Signs are dizziness, sudden trouble walking, or loss of balance. E - Eyes. Signs are trouble seeing or a sudden change in vision. F - Face. Signs are sudden weakness or numbness of the face, or the face or eyelid drooping on one side. A - Arms. Signs are weakness or numbness in an arm. This happens suddenly and usually on one side of the body. S - Speech. Signs are sudden trouble speaking, slurred speech, or trouble understanding what people say. T - Time. Time to call emergency services. Write down what time symptoms started. You have other signs of a stroke, such as: A sudden, severe headache with no known cause. Nausea or vomiting. Seizure. These symptoms may represent a serious problem that is an emergency. Do not wait to see if the symptoms will go away. Get medical help right away. Call your local emergency services (911 in the [place] Do not drive yourself to the hospital. Summary Orthostatic hypotension is  a sudden drop in blood pressure. It can cause light-headedness, sweating, rapid heartbeat, blurred vision, and fainting. Orthostatic hypotension can be diagnosed by having your blood pressure taken while lying down, sitting, and then standing. Treatment may involve changing your diet, wearing compression stockings, sitting up slowly, adjusting your medicines, or correcting the underlying reason for the orthostatic hypotension. Get help right away if you have chest pain, a fast or irregular heartbeat, or symptoms of a stroke. This information is not intended to replace advice given to you by your health care provider. Make sure you discuss any questions you have with your health care provider. Document Revised: 07/02/2020 Document Reviewed: 07/02/2020 Elsevier Patient Education  [place].

## 2022-05-05 ENCOUNTER — Ambulatory Visit: Payer: Medicare Other | Attending: Physician Assistant | Admitting: Cardiology

## 2022-05-05 ENCOUNTER — Telehealth: Payer: Self-pay | Admitting: Pharmacy Technician

## 2022-05-05 ENCOUNTER — Encounter: Payer: Self-pay | Admitting: Physician Assistant

## 2022-05-05 ENCOUNTER — Ambulatory Visit (INDEPENDENT_AMBULATORY_CARE_PROVIDER_SITE_OTHER): Payer: Medicare Other

## 2022-05-05 VITALS — BP 124/90 | HR 83 | Ht 66.0 in | Wt 165.0 lb

## 2022-05-05 DIAGNOSIS — I350 Nonrheumatic aortic (valve) stenosis: Secondary | ICD-10-CM | POA: Diagnosis not present

## 2022-05-05 DIAGNOSIS — I2510 Atherosclerotic heart disease of native coronary artery without angina pectoris: Secondary | ICD-10-CM | POA: Diagnosis not present

## 2022-05-05 DIAGNOSIS — E785 Hyperlipidemia, unspecified: Secondary | ICD-10-CM | POA: Diagnosis not present

## 2022-05-05 DIAGNOSIS — R002 Palpitations: Secondary | ICD-10-CM

## 2022-05-05 DIAGNOSIS — I10 Essential (primary) hypertension: Secondary | ICD-10-CM

## 2022-05-05 DIAGNOSIS — Z8679 Personal history of other diseases of the circulatory system: Secondary | ICD-10-CM | POA: Diagnosis not present

## 2022-05-05 DIAGNOSIS — Z7901 Long term (current) use of anticoagulants: Secondary | ICD-10-CM | POA: Diagnosis not present

## 2022-05-05 DIAGNOSIS — I77819 Aortic ectasia, unspecified site: Secondary | ICD-10-CM

## 2022-05-05 DIAGNOSIS — I480 Paroxysmal atrial fibrillation: Secondary | ICD-10-CM | POA: Diagnosis not present

## 2022-05-05 DIAGNOSIS — R42 Dizziness and giddiness: Secondary | ICD-10-CM

## 2022-05-05 DIAGNOSIS — Z596 Low income: Secondary | ICD-10-CM

## 2022-05-05 NOTE — Patient Instructions (Addendum)
Medication Instructions:  No changes at this time.   *If you need a refill on your cardiac medications before your next appointment, please call your pharmacy*   Lab Work: CBC, BMET, and TSH to be done today over at the [facility] entrance.  If you have labs (blood work) drawn today and your tests are completely normal, you will receive your results only by: [place] (if you have MyChart) OR A paper copy in the mail If you have any lab test that is abnormal or we need to change your treatment, we will call you to review the results.   Testing/Procedures:   Your physician has requested that you have an echocardiogram. Echocardiography is a painless test that uses sound waves to create images of your heart. It provides your doctor with information about the size and shape of your heart and how well your heart's chambers and valves are working. This procedure takes approximately one hour. There are no restrictions for this procedure. Please do NOT wear cologne, perfume, aftershave, or lotions (deodorant is allowed). Please arrive 15 minutes prior to your appointment time.  Your physician has recommended that you wear a Zio AT Live monitor for 14 days.   This monitor is a medical device that records the heart's electrical activity. Doctors most often use these monitors to diagnose arrhythmias. Arrhythmias are problems with the speed or rhythm of the heartbeat. The monitor is a small device applied to your chest. You can wear one while you do your normal daily activities. While wearing this monitor if you have any symptoms to push the button and record what you felt. Once you have worn this monitor for the period of time provider prescribed (Usually 14 days), you will return the monitor device in the postage paid box/bag. Once it is returned they will download the data collected and provide [place] with a report which the provider will then review and we will call you with those results.    Important tips:  Avoid showering during the first 24 hours of wearing the monitor. Avoid excessive sweating to help maximize wear time. Do not submerge the device, no hot tubs, and no swimming pools. Keep any lotions or oils away from the patch. After 24 hours you may shower with the patch on. Take brief showers with your back facing the shower head.  Do not remove patch once it has been placed because that will interrupt data and decrease adhesive wear time. Push the button when you have any symptoms and write down what you were feeling. Once you have completed wearing your monitor, remove and place into box which has postage paid and place in your outgoing mailbox.  If for some reason you have misplaced your box then call our office and we can provide another box and/or mail it off for you. Keep the transmitter within 10 feet at all times.  Expect a welcome phone call within 63-33 hrs of application from [place].  This call will include your copay information, so please answer any unknown phone calls while wearing Zio (it could also be important information about your heart) The envelope to return Zio is in the back of the transmitter. Removal instructions are on the last page of the symptom diary.  Place the patch sticky side up inside the transmitter and the symptom diary inside the envelope to return on your last wear day inside your mailbox or any USPS mailbox.    Follow-Up: At [facility], you and  your health needs are our priority.  As part of our continuing mission to provide you with exceptional heart care, we have created designated Provider Care Teams.  These Care Teams include your primary Cardiologist (physician) and Advanced Practice Providers (APPs -  Physician Assistants and Nurse Practitioners) who all work together to provide you with the care you need, when you need it.   Your next appointment:   6 week(s)  The format for your next appointment:   In  Person  Provider:   Kathlyn Sacramento, MD or Christell Faith, PA-C        Important Information About [place]

## 2022-05-05 NOTE — Progress Notes (Signed)
[place] [facility])                                            [place] Team    05/05/2022  Tracy Bell July 31, 1950 [id number]  Care coordination call placed to BI in regard to [facility] application.  Spoke to [place] who informs patient is APPROVED 05/02/22-05/02/23. Patient will need to call in for refills which will then be delivered to her home address.  Frankye Schwegel P. Michelina Mexicano, [place]  [phone]

## 2022-05-06 ENCOUNTER — Telehealth: Payer: Self-pay | Admitting: Physician Assistant

## 2022-05-06 NOTE — Telephone Encounter (Signed)
Spoke with patient in regards to her blood work and MRI brain. She apologized stating that she forgot to go over for her labs. Provided her with number to schedule MRI but she didn't have anything to write it down. Advised that I would be happy to send that information to her through my chart and that if she should have any further questions to please give [place] a call.

## 2022-05-13 ENCOUNTER — Other Ambulatory Visit
Admission: RE | Admit: 2022-05-13 | Discharge: 2022-05-13 | Disposition: A | Discharge status: Home / Self Care | Payer: Medicare Other | Attending: Cardiology | Admitting: Cardiology

## 2022-05-13 ENCOUNTER — Ambulatory Visit: Payer: Medicare Other | Attending: Cardiology

## 2022-05-13 DIAGNOSIS — R42 Dizziness and giddiness: Secondary | ICD-10-CM | POA: Diagnosis not present

## 2022-05-13 DIAGNOSIS — I480 Paroxysmal atrial fibrillation: Secondary | ICD-10-CM

## 2022-05-13 DIAGNOSIS — Z7901 Long term (current) use of anticoagulants: Secondary | ICD-10-CM | POA: Diagnosis not present

## 2022-05-13 DIAGNOSIS — I2510 Atherosclerotic heart disease of native coronary artery without angina pectoris: Secondary | ICD-10-CM | POA: Diagnosis not present

## 2022-05-13 DIAGNOSIS — Z8679 Personal history of other diseases of the circulatory system: Secondary | ICD-10-CM | POA: Diagnosis not present

## 2022-05-13 LAB — CBC
HCT: 48.5 % — ABNORMAL HIGH (ref 36.0–46.0)
Hemoglobin: 15.8 g/dL — ABNORMAL HIGH (ref 12.0–15.0)
MCH: 30.8 pg (ref 26.0–34.0)
MCHC: 32.6 g/dL (ref 30.0–36.0)
MCV: 94.5 fL (ref 80.0–100.0)
Platelets: 201 K/uL (ref 150–400)
RBC: 5.13 MIL/uL — ABNORMAL HIGH (ref 3.87–5.11)
RDW: 14.7 % (ref 11.5–15.5)
WBC: 9.2 K/uL (ref 4.0–10.5)
nRBC: 0 % (ref 0.0–0.2)

## 2022-05-13 LAB — BASIC METABOLIC PANEL WITH GFR
Anion gap: 8 (ref 5–15)
BUN: 16 mg/dL (ref 8–23)
CO2: 28 mmol/L (ref 22–32)
Calcium: 8.9 mg/dL (ref 8.9–10.3)
Chloride: 103 mmol/L (ref 98–111)
Creatinine, Ser: 1.16 mg/dL — ABNORMAL HIGH (ref 0.44–1.00)
GFR, Estimated: 50 mL/min — ABNORMAL LOW
Glucose, Bld: 94 mg/dL (ref 70–99)
Potassium: 4 mmol/L (ref 3.5–5.1)
Sodium: 139 mmol/L (ref 135–145)

## 2022-05-14 DIAGNOSIS — R42 Dizziness and giddiness: Secondary | ICD-10-CM | POA: Diagnosis not present

## 2022-05-14 DIAGNOSIS — R002 Palpitations: Secondary | ICD-10-CM

## 2022-05-14 LAB — ECHOCARDIOGRAM COMPLETE
AR max vel: 1.26 cm2
AV Area VTI: 1.2 cm2
AV Area mean vel: 1.19 cm2
AV Mean grad: 18 mmHg
AV Peak grad: 28.8 mmHg
Ao pk vel: 2.68 m/s
Area-P 1/2: 4.68 cm2
Calc EF: 73.9 %
P 1/2 time: 1042 ms
S' Lateral: 2.4 cm
Single Plane A2C EF: 76.4 %
Single Plane A4C EF: 68.7 %

## 2022-05-15 DIAGNOSIS — R42 Dizziness and giddiness: Secondary | ICD-10-CM | POA: Diagnosis not present

## 2022-05-15 DIAGNOSIS — R002 Palpitations: Secondary | ICD-10-CM | POA: Diagnosis not present

## 2022-05-16 ENCOUNTER — Other Ambulatory Visit: Payer: Self-pay | Admitting: *Deleted

## 2022-05-16 DIAGNOSIS — I351 Nonrheumatic aortic (valve) insufficiency: Secondary | ICD-10-CM

## 2022-05-24 DIAGNOSIS — J438 Other emphysema: Secondary | ICD-10-CM | POA: Diagnosis not present

## 2022-05-24 DIAGNOSIS — F17218 Nicotine dependence, cigarettes, with other nicotine-induced disorders: Secondary | ICD-10-CM | POA: Diagnosis not present

## 2022-06-13 ENCOUNTER — Telehealth: Payer: Self-pay

## 2022-06-13 NOTE — Telephone Encounter (Signed)
-----   Message from Trudi Ida, NP sent at 06/13/2022  1:49 PM EST ----- Ms. Furuta,   Your monitor showed continued atrial fibrillation, no pauses or slowed rhythm. This is what we anticipated. Stable results.  Best,  Anderson Malta

## 2022-06-13 NOTE — Telephone Encounter (Signed)
Spoke with the patient and informed her of heart monitor results "Your monitor showed continued atrial fibrillation, no pauses or slowed rhythm. This is what we anticipated. Stable results." Patient satisfied with results

## 2022-06-16 NOTE — Progress Notes (Unsigned)
Cardiology Office Note    Date:  06/20/2022   ID:  Tracy, Bell Mar 22, 1951, MRN [id number]  PCP:  Tracy Hawthorne, FNP  Cardiologist:  Tracy Sacramento, MD  Electrophysiologist:  None   Chief Complaint: Follow-up  History of Present Illness:   Tracy Bell is a 72 y.o. female with history of coronary artery calcification noted on CT imaging, persistent A-fib, atrial flutter diagnosed in 12/2019, SVT, second-degree AV block type I, mild to moderate aortic stenosis, ectatic aorta, carotid artery disease status post right-sided CEA in 07/2018, COPD, HTN, HLD, pulmonary nodules, and ongoing tobacco use who presents for follow-up of Zio patch.  She was previously followed by Dr. Saralyn Bell with CT of the lungs showing evidence of coronary calcification.  Stress echo in 12/2015 showed normal LV systolic function with mild aortic stenosis and no evidence of ischemia with stress.  She had palpitations in 2019 with a 72-hour monitor showing sinus rhythm with frequent PACs and occasional brief atrial runs lasting up to 7 beats.  Echo from 04/2018 showed normal LV systolic function with mild to moderate aortic stenosis with a mean gradient of 13 mmHg and a valve area of 1.16 cm.  She transitioned her care to Dr. Fletcher Anon in 05/2019 for evaluation of tachypalpitations.  Zio patch at that time showed sinus rhythm with an average rate of 65 bpm, sinus bradycardia during the early morning hours with the lowest heart rate of 43 bpm, frequent episodes of SVT totaling 100 runs with the longest episode lasting 15 minutes and 54 seconds, and intermittent Mobitz type I second-degree AV block in the early morning hours.  Echo in 2021 showed an EF of 60 to 65%, borderline LVH, grade 2 diastolic dysfunction, no regional wall motion abnormalities, normal RV systolic function, mild tricuspid regurgitation, mild to moderate aortic stenosis with a mean gradient of 14 mmHg and a valve area of 1.4 cm.  She was  diagnosed with atrial flutter in 12/2019 and initiated on apixaban.  She was noted to have converted to sinus rhythm with titration of diltiazem in follow-up in 01/2020.  With regards to her vascular disease, carotid artery ultrasound from 08/2021 showed 1 to 39% bilateral ICA stenoses with bilateral antegrade flow of the vertebral arteries and normal flow hemodynamics in the bilateral subclavian arteries.  Most recent AAA ultrasound from 09/2021 showed mild dilatation of the distal aorta proximal aorta measuring 3 cm and not aneurysmal.  She was evaluated by her PCP on 05/04/2022 for dizziness.  She was found to be in rate controlled A-fib at that time.  Given this finding, she followed up in our office on 05/05/2022 with continued dizziness that she indicated dated back to mid December 2023.  She reported sometimes forgetting to take her second dose of apixaban.  Given missed doses of apixaban, MRI of the brain was ordered and remains pending at this time.  To quantify A-fib burden, she underwent Zio patch which showed a 100% A-fib burden with an average ventricular rate of 91 bpm with a range of 42 to 189 bpm.  Echo from 05/2022 showed an EF of 60 to 65%, no regional wall motion abnormalities, mild LVH, normal RV systolic function and ventricular cavity size, mildly elevated PASP estimated at 37.7 mmHg, moderately dilated left atrium, mild mitral regurgitation, moderate aortic valve stenosis with a mean gradient of 18 mmHg and a valve area of 1.2 cm, and an estimated right atrial pressure of 3 mmHg.  He comes in  doing well from a cardiac perspective, without symptoms of angina or cardiac decompensation.  She has only had 1 further episode of dizziness since her visit in early 05/2022.  She i attributes her dizziness to increased inhaler usage.  Her chronic dyspnea is stable, continues to smoke 1 pack/day.  No palpitations, presyncope, or syncope.  She notes lower extremity swelling that is progressive throughout the  day and improved in the morning.  She does continue to forget her evening doses of apixaban, having forgotten 1 dose each of the past 2 weeks.  She is not interested in transitioning to rivaroxaban.  No falls or symptoms concerning for bleeding.  No progressive orthopnea.  Blood pressure at home is typically been in the Q000111Q to Q000111Q systolic with heart rates in the 80s to 1-teens bpm.   Labs independently reviewed: 05/2022 - Hgb 15.8, PLT 201, potassium 4.0, BUN 16, serum creatinine 1.16 06/2021 - TC 137, TG 92, HDL 44, LDL 74, albumin 4.4, AST/ALT normal, TSH 6.71, free T4 normal 08/2020 - A1c 5.8  Past Medical History:  Diagnosis Date   Anxiety    COPD (chronic obstructive pulmonary disease) (HCC)    Cough    Depression    Dizziness    Dyspnea    Heart murmur    Hyperlipidemia    Hypertension    Lightheadedness    Personal history of tobacco use, presenting hazards to health 08/28/2015    Past Surgical History:  Procedure Laterality Date   ABDOMINAL HYSTERECTOMY  1980   complete   BREAST BIOPSY  1970   normal   DILATION AND CURETTAGE OF UTERUS     ENDARTERECTOMY Right 07/12/2018   Procedure: ENDARTERECTOMY CAROTID;  Surgeon: Tracy Huxley, MD;  Location: [facility] ORS;  Service: Vascular;  Laterality: Right;   TUBAL LIGATION      Current Medications: Current Meds  Medication Sig   apixaban (ELIQUIS) 5 MG TABS tablet Take 1 tablet (5 mg total) by mouth 2 (two) times daily.   atorvastatin (LIPITOR) 80 MG tablet Take 1 tablet (80 mg total) by mouth daily.   cholecalciferol (VITAMIN D3) 25 MCG (1000 UT) tablet Take 1,000 Units by mouth daily.   guaiFENesin (MUCINEX) 600 MG 12 hr tablet Take 600 mg by mouth daily as needed.   losartan (COZAAR) 50 MG tablet Take 1 tablet (50 mg total) by mouth daily.   Multiple Vitamins-Minerals (CENTRUM ADULTS) TABS Take 1 tablet by mouth daily.   Tiotropium Bromide-Olodaterol (STIOLTO RESPIMAT) 2.5-2.5 MCG/ACT AERS Inhale 2 puffs into the lungs daily.    [DISCONTINUED] diltiazem (CARDIZEM CD) 240 MG 24 hr capsule TAKE 1 CAPSULE BY MOUTH EVERY DAY    Allergies:   Patient has no known allergies.   Social History   Socioeconomic History   Marital status: Widowed    Spouse name: Not on file   Number of children: Not on file   Years of education: Not on file   Highest education level: Not on file  Occupational History   Occupation: retired  Tobacco Use   Smoking status: Every Day    Packs/day: 2.00    Years: 60.00    Total pack years: 120.00    Types: Cigarettes    Start date: 05/03/1963   Smokeless tobacco: Never   Tobacco comments:    1 PPD: 02/20/2022  Vaping Use   Vaping Use: Never used  Substance and Sexual Activity   Alcohol use: Yes    Alcohol/week: 0.0 standard drinks of alcohol  Comment: [place] occasionally   Drug use: No   Sexual activity: Not on file  Other Topics Concern   Not on file  Social History Narrative   Lives with husband. Has 11 pets in home. Has one son, lives locally.   Was born and raised in [place].   Husband is older than her and she provides care for him.      Work - retired. Invented water bottle for pets   Social Determinants of Health   Financial Resource Strain: Medium Risk (04/13/2021)   Overall Financial Resource Strain (CARDIA)    Difficulty of Paying Living Expenses: Somewhat hard  Food Insecurity: Not on file  Transportation Needs: Not on file  Physical Activity: Not on file  Stress: Not on file  Social Connections: Not on file     Family History:  The patient's family history includes Cancer in her father and mother; Cancer (age of onset: 78) in her sister; Lung cancer in her sister and sister.  ROS:   12-point review of systems is negative unless otherwise noted in the HPI.   EKGs/Labs/Other Studies Reviewed:    Studies reviewed were summarized above. The additional studies were reviewed today:  Zio patch 05/2022: Atrial Fibrillation occurred continuously  (100% burden), ranging from 42-189 bpm (avg of 91 bpm).  Rare PVCs. __________  2D echo 05/13/2022: 1. Left ventricular ejection fraction, by estimation, is 60 to 65%. The  left ventricle has normal function. The left ventricle has no regional  wall motion abnormalities. There is mild left ventricular hypertrophy.  Left ventricular diastolic parameters  are indeterminate.   2. Right ventricular systolic function is normal. The right ventricular  size is normal. There is mildly elevated pulmonary artery systolic  pressure. The estimated right ventricular systolic pressure is 0000000 mmHg.   3. Left atrial size was moderately dilated.   4. The mitral valve is normal in structure. Mild mitral valve  regurgitation. No evidence of mitral stenosis.   5. The aortic valve is normal in structure. Aortic valve regurgitation is  mild. Moderate aortic valve stenosis. Aortic valve area, by VTI measures  1.20 cm. Aortic valve mean gradient measures 18.0 mmHg. Aortic valve Vmax  measures 2.68 m/s.   6. The inferior vena cava is normal in size with greater than 50%  respiratory variability, suggesting right atrial pressure of 3 mmHg.   Comparison(s): Previous Echo showed LV EF 60-65%, normal LV function, mild  to moderate AS, mean gradient 14 mmHg.  __________  2D echo 06/05/2019: 1. Left ventricular ejection fraction, by visual estimation, is 60 to  65%. The left ventricle has normal function. There is borderline left  ventricular hypertrophy.   2. Left ventricular diastolic parameters are consistent with Grade II  diastolic dysfunction (pseudonormalization).   3. The left ventricle has no regional wall motion abnormalities.   4. Global right ventricle has normal systolic function.The right  ventricular size is normal. Right vetricular wall thickness was not  assessed.   5. Left atrial size was normal.   6. Right atrial size was normal.   7. The mitral valve is normal in structure. No evidence of  mitral valve  regurgitation.   8. The tricuspid valve is normal in structure.   9. The tricuspid valve is normal in structure. Tricuspid valve  regurgitation is mild.  10. The aortic valve is abnormal. Aortic valve regurgitation is not  visualized. Mild to moderate aortic valve stenosis.  11. Peak AV gradient 37mHg, MG 158mg, DVI  0.45, with LVOT diameter of  2cm, AVA 1.4cm2.  12. The pulmonic valve was not well visualized. Pulmonic valve  regurgitation is not visualized.  13. Mildly elevated pulmonary artery systolic pressure.  14. The inferior vena cava is normal in size with greater than 50%  respiratory variability, suggesting right atrial pressure of 3 mmHg.  __________  Elwyn Reach patch 05/2019: Normal sinus rhythm with an average heart rate of 65 bpm. Sinus bradycardia during early morning hours with lowest heart rate of 43 bpm. Frequent episodes of SVT with a total of 100 runs.  The longest run lasted 50 minutes and 54 seconds. Intermittent Mobitz 1 second-degree AV block and early morning hours. Most triggered events correlated with SVT.   EKG:  EKG is ordered today.  The EKG ordered today demonstrates A-fib with RVR, 106 bpm, nonspecific ST-T changes  Recent Labs: 07/12/2021: ALT 21; TSH 6.71 05/13/2022: BUN 16; Creatinine, Ser 1.16; Hemoglobin 15.8; Platelets 201; Potassium 4.0; Sodium 139  Recent Lipid Panel    Component Value Date/Time   CHOL 137 07/12/2021 1400   TRIG 92.0 07/12/2021 1400   HDL 44.70 07/12/2021 1400   CHOLHDL 3 07/12/2021 1400   VLDL 18.4 07/12/2021 1400   LDLCALC 74 07/12/2021 1400    PHYSICAL EXAM:    VS:  BP (!) 160/100 (BP Location: Left Arm, Patient Position: Sitting, Cuff Size: Normal)   Pulse (!) 106   Ht 5' 6"$  (1.676 m)   Wt 164 lb (74.4 kg)   SpO2 93%   BMI 26.47 kg/m   BMI: Body mass index is 26.47 kg/m.  Physical Exam Vitals reviewed.  Constitutional:      Appearance: She is well-developed.  HENT:     Head: Normocephalic and  atraumatic.  Eyes:     General:        Right eye: No discharge.        Left eye: No discharge.  Neck:     Vascular: No JVD.  Cardiovascular:     Rate and Rhythm: Normal rate and regular rhythm.     Pulses:          Posterior tibial pulses are 2+ on the right side and 2+ on the left side.     Heart sounds: S1 normal and S2 normal. Heart sounds not distant. No midsystolic click and no opening snap. Murmur heard.     Harsh midsystolic murmur is present with a grade of 2/6 at the upper right sternal border radiating to the neck.     No friction rub.  Pulmonary:     Effort: Pulmonary effort is normal. No respiratory distress.     Breath sounds: Normal breath sounds. No decreased breath sounds, wheezing or rales.  Chest:     Chest wall: No tenderness.  Abdominal:     General: There is no distension.  Musculoskeletal:     Cervical back: Normal range of motion.     Comments: Trivial bilateral pretibial edema.  Skin:    General: Skin is warm and dry.     Nails: There is no clubbing.  Neurological:     Mental Status: She is alert and oriented to person, place, and time.  Psychiatric:        Speech: Speech normal.        Behavior: Behavior normal.        Thought Content: Thought content normal.        Judgment: Judgment normal.     Wt Readings from Last 3 Encounters:  06/20/22 164 lb (74.4 kg)  05/05/22 165 lb (74.8 kg)  05/04/22 164 lb (74.4 kg)     ASSESSMENT & PLAN:   Persistent A-fib: Recent Zio patch showed 100% A-fib burden.  She is largely asymptomatic with regards to her A-fib.  We did discuss rhythm control strategy by pursuing DCCV, however this has been limited by missed doses of apixaban.  The importance of anticoagulation was discussed with the patient in detail today.  I did offer to transition her from a twice daily medicine to once daily (apixaban to rivaroxaban) for ease of dosing.  However, she declines this.  I have also recommended we follow-up with a CBC, BMP,  and TSH which she indicates that she can get for free through her PCP's office.  I have asked her to follow-up with her PCP in this setting for blood work.  For now, given that she has missed at least 2 doses of apixaban over the preceding 2 weeks, we will need to pursue rate control with the titration of Cardizem CD to 360 mg daily.  Avoiding beta-blocker at this time given underlying COPD with ongoing tobacco use.  CHA2DS2-VASc at least 4 (HTN, age x 1, vascular disease, sex category).  Continue apixaban 5 mg twice daily.  She does not meet reduced dosing criteria.  No symptoms concerning for bleeding.  Recommend labs as outlined above.  Ultimately, we may need to consider rate control strategy due to concerns regarding missed anticoagulation, particularly if sinus rhythm is restored.  Atrial flutter: Management as above.  SVT: Management as above.  Coronary artery calcification: She is without symptoms of angina or cardiac decompensation.  She does note a long history of shortness of breath that is stable and has been attributed to underlying COPD with ongoing tobacco use.  Risk factors include ongoing tobacco use, HTN, HLD, and known coronary artery calcification.  May benefit from a calcium score at a minimum to quantify calcium and assist with risk factor modification.  Has declined ischemic testing in the past.  May need to consider ischemic testing given baseline dyspnea.  She is on apixaban in place of aspirin given underlying A-fib/flutter.  Continue atorvastatin and risk factor modification, including complete smoking cessation.  Mild to moderate aortic stenosis: Asymptomatic.  Slight progression of aortic stenosis on echo last month when compared to study from 2021.  Repeat echo in 05/2023.  History of secondary AV block type I: Noted during nocturnal hours.  Has declined sleep study.  No indication for PPM at this time.  Will need to monitor EKG closely should rhythm control strategy be pursued  following the titration of AV nodal blocking medication.  HTN: Blood pressure is elevated in the office today.  Titrate diltiazem as outlined above with continuation of losartan.  HLD: LDL 74 in 06/2021.  He remains on atorvastatin 80 mg.  Carotid artery disease: Status post right-sided CEA.  Most recent carotid artery ultrasound from 08/2021 showed bilateral 1 to 39% ICA stenoses.  On apixaban in place of aspirin given A-fib/flutter, as well as atorvastatin.  Followed by vascular surgery.  Ectatic thoracic/abdominal aorta: Stable on most recent imaging.  Followed by vascular surgery.  Upper limits of normal ascending thoracic aorta on CT imaging 07/2021.  COPD with ongoing tobacco use: Stable, without acute exacerbation.  Complete smoking cessation is recommended.  Pulmonary nodules: Followed by nodule clinic.   Disposition: F/u with Dr. Fletcher Anon or an APP in 1 month.   Medication Adjustments/Labs and Tests Ordered:  Current medicines are reviewed at length with the patient today.  Concerns regarding medicines are outlined above. Medication changes, Labs and Tests ordered today are summarized above and listed in the Patient Instructions accessible in Encounters.   Signed, Christell Faith, PA-C 06/20/2022 12:53 PM     [employer] [place], [place] [address] [phone]

## 2022-06-20 ENCOUNTER — Ambulatory Visit: Payer: Medicare Other | Attending: Physician Assistant | Admitting: Physician Assistant

## 2022-06-20 ENCOUNTER — Encounter: Payer: Self-pay | Admitting: Physician Assistant

## 2022-06-20 VITALS — BP 160/100 | HR 106 | Ht 66.0 in | Wt 164.0 lb

## 2022-06-20 DIAGNOSIS — J449 Chronic obstructive pulmonary disease, unspecified: Secondary | ICD-10-CM

## 2022-06-20 DIAGNOSIS — I4819 Other persistent atrial fibrillation: Secondary | ICD-10-CM | POA: Diagnosis not present

## 2022-06-20 DIAGNOSIS — Z720 Tobacco use: Secondary | ICD-10-CM

## 2022-06-20 DIAGNOSIS — I4892 Unspecified atrial flutter: Secondary | ICD-10-CM

## 2022-06-20 DIAGNOSIS — I350 Nonrheumatic aortic (valve) stenosis: Secondary | ICD-10-CM | POA: Diagnosis not present

## 2022-06-20 DIAGNOSIS — I2510 Atherosclerotic heart disease of native coronary artery without angina pectoris: Secondary | ICD-10-CM | POA: Diagnosis not present

## 2022-06-20 DIAGNOSIS — I10 Essential (primary) hypertension: Secondary | ICD-10-CM | POA: Diagnosis not present

## 2022-06-20 DIAGNOSIS — E785 Hyperlipidemia, unspecified: Secondary | ICD-10-CM

## 2022-06-20 DIAGNOSIS — I471 Supraventricular tachycardia, unspecified: Secondary | ICD-10-CM

## 2022-06-20 DIAGNOSIS — R911 Solitary pulmonary nodule: Secondary | ICD-10-CM | POA: Diagnosis not present

## 2022-06-20 DIAGNOSIS — I77819 Aortic ectasia, unspecified site: Secondary | ICD-10-CM

## 2022-06-20 DIAGNOSIS — Z8679 Personal history of other diseases of the circulatory system: Secondary | ICD-10-CM

## 2022-06-20 MED ORDER — DILTIAZEM HCL ER COATED BEADS 360 MG PO CP24: 360.0000 mg | ORAL_CAPSULE | Freq: Every day | ORAL | 3 refills | Status: DC

## 2022-06-20 NOTE — Patient Instructions (Signed)
Medication Instructions:  Your physician has recommended you make the following change in your medication:   START - diltiazem (CARDIZEM CD) 360 MG 24 hr capsule, take 1 capsule by mouth daily  *If you need a refill on your cardiac medications before your next appointment, please call your pharmacy*   Lab Work: - None ordered If you have labs (blood work) drawn today and your tests are completely normal, you will receive your results only by: [place] (if you have MyChart) OR A paper copy in the mail If you have any lab test that is abnormal or we need to change your treatment, we will call you to review the results.   Testing/Procedures: - None ordered  Follow-Up: At [facility], you and your health needs are our priority.  As part of our continuing mission to provide you with exceptional heart care, we have created designated Provider Care Teams.  These Care Teams include your primary Cardiologist (physician) and Advanced Practice Providers (APPs -  Physician Assistants and Nurse Practitioners) who all work together to provide you with the care you need, when you need it.  We recommend signing up for the patient portal called "MyChart".  Sign up information is provided on this After Visit Summary.  MyChart is used to connect with patients for Virtual Visits (Telemedicine).  Patients are able to view lab/test results, encounter notes, upcoming appointments, etc.  Non-urgent messages can be sent to your provider as well.   To learn more about what you can do with MyChart, go to [email or web address].    Your next appointment:   1 month(s)  Provider:   Christell Faith, PA-C    Other Instructions - None

## 2022-07-16 ENCOUNTER — Encounter: Payer: Self-pay | Admitting: Family

## 2022-07-16 ENCOUNTER — Other Ambulatory Visit: Payer: Self-pay | Admitting: Family

## 2022-07-18 ENCOUNTER — Other Ambulatory Visit: Payer: Self-pay | Admitting: Family

## 2022-07-18 DIAGNOSIS — I4891 Unspecified atrial fibrillation: Secondary | ICD-10-CM | POA: Insufficient documentation

## 2022-07-18 DIAGNOSIS — I4811 Longstanding persistent atrial fibrillation: Secondary | ICD-10-CM

## 2022-07-18 MED ORDER — APIXABAN 5 MG PO TABS: 5.0000 mg | ORAL_TABLET | Freq: Two times a day (BID) | ORAL | 4 refills | Status: DC

## 2022-07-25 NOTE — Progress Notes (Unsigned)
Cardiology Office Note:    Date:  07/26/2022   ID:  Tracy Bell [birth year], MRN [id number]  PCP:  Burnard Hawthorne, [place] Prov[facility] Cardiologist:  Kathlyn Sacramento, MD Cardiology APP:  Arvil Chaco, PA-C (Inactive)      Referring MD: Burnard Hawthorne, FNP   Chief complaint: dizziness  History of Present Illness:    Tracy Bell is a 72 y.o. 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  She was previously followed by Dr. Saralyn Pilar for CT of the lung which showed evidence of coronary calcification.  She had a stress echo in 2017 which showed normal LV systolic function with mild aortic stenosis and no evidence of ischemia.  In 2019 she wore a 3-day monitor for palpitations which showed sinus rhythm with frequent PVCs and brief atrial runs.  Echo in 2019 showed normal LV systolic function.   In 2021 she transitioned her care to Dr. Fletcher Anon.  She wore a Zio patch in 2021 which showed sinus rhythm with an average heart rate of 65, frequent episodes of SVT, and intermittent Mobitz type I second-degree AV heart block.  Echo at this time showed an EF of 60-65%, borderline LVH, grade 2 DD, mild TR, mild to moderate aortic stenosis.  In August 2021 she was diagnosed with atrial flutter and started on Eliquis, she subsequently converted to sinus rhythm after titration of diltiazem.  Was evaluated by [name] PA on 11/24/20, doing ok but having issues affording her Eliquis and frequently forgetting her second dose.   Was evaluated by her PCP on 05/04/22 for new onset of dizziness. Workup revealed she was in AF, rate controlled, and we were asked to see her for further workup.  She presented on  05/05/2022 for follow-up of dizziness and atrial fibrillation, she reported she frequently forgets to take her second dose of Eliquis, we discussed changing to possibly Xarelto or Coumadin however she wanted to continue with Eliquis.  She wore a 14-day monitor which revealed she was continually in atrial fibrillation, no pauses noted.  Echo was completed on 05/14/2022 which showed an EF of 60 to 65%, mild LVH mildly elevated PA systolic pressure at 37 mmHg, moderate aortic stenosis.  Most recently she was evaluated by Christell Faith, PA on 06/20/2022, she was doing well from a cardiac perspective but continued to miss her second dose of Eliquis.  DCCV is not an option as she continued to miss her doses of Eliquis, she again declined to transition to Xarelto or Coumadin to assist with ease of dosing.  She presents today for follow-up of her atrial fibrillation.  She has been more compliant with her Eliquis but is still missing 1 dose generally every 1 to 2 weeks.  We again reviewed the importance of uninterrupted anticoagulation and that consideration of DCCV cannot even be entertained until she has maintained compliance.  Her blood pressure is elevated in the office today at 150/89, however she has not taken her Cozaar or diltiazem today.  She endorses pedal edema, weight gain and fatigue that has been progressive over the last 3 weeks, she does not weigh herself daily however reviewing her chart it looks like she has gained approximately 5 pounds over the last month.  She denies chest pain, palpitations,  dyspnea, pnd, orthopnea, n, v, dizziness, syncope, or early satiety.    Past Medical History:  Diagnosis Date   Anxiety    COPD (chronic obstructive pulmonary disease) (HCC)    Cough    Depression    Dizziness    Dyspnea    Heart murmur    Hyperlipidemia    Hypertension    Lightheadedness    Personal history of tobacco use, presenting hazards to health 08/28/2015    Past Surgical History:  Procedure  Laterality Date   ABDOMINAL HYSTERECTOMY  1980   complete   BREAST BIOPSY  1970   normal   DILATION AND CURETTAGE OF UTERUS     ENDARTERECTOMY Right 07/12/2018   Procedure: ENDARTERECTOMY CAROTID;  Surgeon: Algernon Huxley, MD;  Location: [facility] ORS;  Service: Vascular;  Laterality: Right;   TUBAL LIGATION      Current Medications: Current Meds  Medication Sig   apixaban (ELIQUIS) 5 MG TABS tablet Take 1 tablet (5 mg total) by mouth 2 (two) times daily.   atorvastatin (LIPITOR) 80 MG tablet Take 1 tablet (80 mg total) by mouth daily.   cholecalciferol (VITAMIN D3) 25 MCG (1000 UT) tablet Take 1,000 Units by mouth daily.   diltiazem (CARDIZEM CD) 360 MG 24 hr capsule Take 1 capsule (360 mg total) by mouth daily.   furosemide (LASIX) 20 MG tablet Take 1 tablet (20 mg total) by mouth daily as needed for edema (Take as needed for swelling or weight gain greater than 3 pounds overnight). TAKE Furosemide 20 mg once daily for 3 days THEN as needed   guaiFENesin (MUCINEX) 600 MG 12 hr tablet Take by mouth daily.   losartan (COZAAR) 50 MG tablet Take 1 tablet (50 mg total) by mouth daily.   Multiple Vitamins-Minerals (CENTRUM ADULTS) TABS Take 1 tablet by mouth daily.   Tiotropium Bromide-Olodaterol (STIOLTO RESPIMAT) 2.5-2.5 MCG/ACT AERS Inhale 2 puffs into the lungs daily.     Allergies:   Patient has no known allergies.   Social History   Socioeconomic History   Marital status: Widowed    Spouse name: Not on file   Number of children: Not on file   Years of education: Not on file   Highest education level: Not on file  Occupational History   Occupation: retired  Tobacco Use   Smoking status: Every Day    Packs/day: 2.00    Years: 60.00    Additional pack years: 0.00    Total pack years: 120.00    Types: Cigarettes    Start date: 05/03/1963   Smokeless tobacco: Never   Tobacco comments:    1 PPD: 02/20/2022  Vaping Use   Vaping Use: Never used  Substance and Sexual Activity    Alcohol use: Yes    Alcohol/week: 0.0 standard drinks of alcohol    Comment: [place] occasionally   Drug use: No   Sexual activity: Not on file  Other Topics Concern   Not on file  Social History Narrative   Lives with husband. Has 11 pets in home. Has one son, lives locally.   Was born and raised in [place].   Husband is older than her and she provides care for him.      Work - retired. Invented water bottle for pets   Social Determinants of Health   Financial Resource Strain: Medium Risk (04/13/2021)   Overall Financial Resource Strain (CARDIA)    Difficulty of Paying Living Expenses: Somewhat hard  Food Insecurity: Not on  file  Transportation Needs: Not on file  Physical Activity: Not on file  Stress: Not on file  Social Connections: Not on file     Family History: The patient's family history includes Cancer in her father and mother; Cancer (age of onset: 26) in her sister; Lung cancer in her sister and sister.  ROS:   Review of Systems  Constitutional: Negative.   HENT:  Negative for nosebleeds.   Eyes:  Negative for blurred vision and double vision.  Respiratory:  Positive for cough. Negative for hemoptysis, sputum production, shortness of breath and wheezing.   Cardiovascular:  Positive for leg swelling. Negative for chest pain, palpitations, orthopnea, claudication and PND.  Gastrointestinal:  Negative for blood in stool and melena.  Genitourinary:  Negative for hematuria.  Musculoskeletal: Negative.  Negative for falls.  Skin: Negative.   Neurological:  Negative for dizziness, tingling, loss of consciousness and headaches.  Endo/Heme/Allergies:  Bruises/bleeds easily.  Psychiatric/Behavioral: Negative.       EKGs/Labs/Other Studies Reviewed:    The following studies were reviewed today:  06/06/2022 ZIO monitor - Atrial Fibrillation occurred continuously (100% burden), ranging from 42-189 bpm (avg of 91 bpm). Rare PVCs.  05/13/2022 echo complete -EF  60 to 65%, mild LVH, LA moderately dilated, mild MR, moderate aortic valve stenosis, mildly elevated PA systolic pressure  [id number] duplex -  Mild dilitation of the distal aorta compared to the mid dimensions with moderate atherosclerosis throughout. The proximal  abdominal aorta shows dimensions above 3cm but does not appear aneurysmal  09/07/21 Carotid doppler - R carotid 1-39% stenosis, L carotid 1-39% stenosis  06/04/21 echo complete - EF 60-65%, borderline LVH, grade II diastolic dysfunction, no RWMA. Mild TVR. AV mild > mod stenosis. Mildly elevated PA pressures (32).   06/21/19 14 day ZIO - Normal sinus rhythm with an average heart rate of 65 bpm. Sinus bradycardia during early morning hours with lowest heart rate of 43 bpm. Frequent episodes of SVT with a total of 100 runs.  The longest run lasted 50 minutes and 54 seconds. Intermittent Mobitz 1 second-degree AV block and early morning hours. Most triggered events correlated with SVT.     EKG:  EKG is ordered today.  The ekg ordered today demonstrates atrial fibrillation, HR 83 bpm.   Recent Labs: 05/13/2022: BUN 16; Creatinine, Ser 1.16; Hemoglobin 15.8; Platelets 201; Potassium 4.0; Sodium 139  Recent Lipid Panel    Component Value Date/Time   CHOL 137 07/12/2021 1400   TRIG 92.0 07/12/2021 1400   HDL 44.70 07/12/2021 1400   CHOLHDL 3 07/12/2021 1400   VLDL 18.4 07/12/2021 1400   LDLCALC 74 07/12/2021 1400     Risk Assessment/Calculations:    CHA2DS2-VASc Score = 4   This indicates a 4.8% annual risk of stroke. The patient's score is based upon: CHF History: 0 HTN History: 1 Diabetes History: 0 Stroke History: 0 Vascular Disease History: 1 Age Score: 1 Gender Score: 1             Physical Exam:    VS:  BP (!) 150/89 (BP Location: Left Arm, Patient Position: Sitting, Cuff Size: Normal) Comment: Had not taken her blood pressure medication today  Pulse 90   Ht 5\' 6"  (1.676 m)   Wt 168 lb 6.4 oz (76.4 kg)    SpO2 92%   BMI 27.18 kg/m     Wt Readings from Last 3 Encounters:  07/26/22 168 lb 6.4 oz (76.4 kg)  06/20/22 164 lb (74.4 kg)  05/05/22 165 lb (74.8 kg)     GEN:  Well nourished, well developed in no acute distress HEENT: Normal NECK: No JVD; No carotid bruits LYMPHATICS: No lymphadenopathy CARDIAC: irregular rhythm, 2/6 murmur noted right and left sternal border, no rubs,  no gallops RESPIRATORY:  Clear to auscultation without rales, wheezing or rhonchi  ABDOMEN: Soft, non-tender, non-distended MUSCULOSKELETAL:  +1 edema mid tibia; No deformity  SKIN: Warm and dry NEUROLOGIC:  Alert and oriented x 3 PSYCHIATRIC:  Normal affect   ASSESSMENT:    1. Persistent atrial fibrillation ([place])   2. Aortic stenosis, moderate   3. Essential hypertension   4. Tobacco use   5. Hyperlipidemia LDL goal <70   6. Coronary artery calcification seen on CT scan   7. Chronic heart failure with preserved ejection fraction (HCC)    PLAN:    In order of problems listed above:  Persistent AF with chronic anticoagulation- CHA2DS2-VASc Score = 4 [CHF History: 0, HTN History: 1, Diabetes History: 0, Stroke History: 0, Vascular Disease History: 1, Age Score: 1, Gender Score: 1].  Therefore, the patient's annual risk of stroke is 4.8 %. Currently on Eliquis 5 mg twice daily (no indication for dose reduction), cardizem 360 mg daily.  She has been more compliant with her Eliquis however she continues to miss 1 dose every 1 to 2 weeks.  We again discussed the imperative nature of complying with her Eliquis dosing.  We briefly discussed DCCV, again explained that she must be in compliance prior to and after DCCV and that the longer she remains in atrial fibrillation the less likelihood there is of reestablishing normal sinus rhythm.  HFpEF - EF 60 to 65%, mild LVH, NYHA class I-II today, she is slightly volume overloaded, weight is up 5 lbs since last visit. Lasix 20 mg PO x 3 days, then PRN for worsening pedal  edema or wt gain 3 lbs/day. Check BNP, BMET today, then repeat BMET in 1 week.   HTN - BP today is elevated at 150/89, however she has not taken her Cozaar or diltiazem today.  It was elevated at her recent OV visit as well, may need to consider increasing her Cozaar to 100 mg at next visit.  Coronary artery calcification - Noted on AAA duplex. Continue lipitor 80 mg daily, LDL on 07/12/21 was 74.Stable with no anginal symptoms. No indication for ischemic evaluation.    Aortic stenosis - Repeat echo 05/13/22 EF 60-65%, mild LVH, mildly elevated PA pressure, LA mod dilated. Volume overloaded today, diuresing per above.   History of Mobitz type I - Seen on previous Zio monitor ~ 5 am. 14 day zio, which showed AF, no pauses.   Ectatic aorta - She follows with [facility]. Recent duplex on 10/05/21 Mild dilitation of the distal aorta compared to the mid dimensions with moderate atherosclerosis throughout. The proximal. abdominal aorta shows dimensions above 3cm but does not appear aneurysmal.   HLD - Continue lipitor 80 mg daily, LDL on 07/12/21 was 74, repeat testing at next appt. May need to add Zetia if not at goal.   Disposition - BMET, BNP today. Lasix 20 mg PO x next 3 am, then PRN. Repeat BMET in 1 week. Return in 1 month, see if she has been compliant with DOAC and discuss DCCV.          Medication Adjustments/Labs and Tests Ordered: Current medicines are reviewed at length with the patient today.  Concerns regarding medicines are outlined above.  Orders Placed This Encounter  Procedures   Basic metabolic panel   B Nat Peptide   EKG 12-Lead   Meds ordered this encounter  Medications   furosemide (LASIX) 20 MG tablet    Sig: Take 1 tablet (20 mg total) by mouth daily as needed for edema (Take as needed for swelling or weight gain greater than 3 pounds overnight). TAKE Furosemide 20 mg once daily for 3 days THEN as needed    Dispense:  90 tablet    Refill:  3     Patient Instructions  Medication Instructions:  Your physician has recommended you make the following change in your medication:   START Furosemide 20 mg once daily for 3 days THEN AS NEEDED for swelling or 3 pound weight gain overnight.  TAKE Eliquis TWICE A DAY  *If you need a refill on your cardiac medications before your next appointment, please call your pharmacy*   Lab Work: BMET & BNP today at the [facility]. Check in at registration desk.   Repeat BMET in one week at the [facility].   If you have labs (blood work) drawn today and your tests are completely normal, you will receive your results only by: [facility] (if you have MyChart) OR A paper copy in the mail If you have any lab test that is abnormal or we need to change your treatment, we will call you to review the results.   Testing/Procedures: None   Follow-Up: At [facility], you and your health needs are our priority.  As part of our continuing mission to provide you with exceptional heart care, we have created designated Provider Care Teams.  These Care Teams include your primary Cardiologist (physician) and Advanced Practice Providers (APPs -  Physician Assistants and Nurse Practitioners) who all work together to provide you with the care you need, when you need it.   Your next appointment:   1 month(s)  Provider:   Kathlyn Sacramento, MD or Christell Faith, PA-C       Signed, Trudi Ida, NP  07/26/2022 12:03 PM    [place]

## 2022-07-26 ENCOUNTER — Encounter: Payer: Self-pay | Admitting: Physician Assistant

## 2022-07-26 ENCOUNTER — Other Ambulatory Visit
Admission: RE | Admit: 2022-07-26 | Discharge: 2022-07-26 | Disposition: A | Discharge status: Home / Self Care | Payer: Medicare Other | Attending: Physician Assistant | Admitting: Physician Assistant

## 2022-07-26 ENCOUNTER — Ambulatory Visit: Payer: Medicare Other | Attending: Physician Assistant | Admitting: Cardiology

## 2022-07-26 VITALS — BP 150/89 | HR 90 | Ht 66.0 in | Wt 168.4 lb

## 2022-07-26 DIAGNOSIS — E785 Hyperlipidemia, unspecified: Secondary | ICD-10-CM | POA: Insufficient documentation

## 2022-07-26 DIAGNOSIS — I350 Nonrheumatic aortic (valve) stenosis: Secondary | ICD-10-CM

## 2022-07-26 DIAGNOSIS — I2510 Atherosclerotic heart disease of native coronary artery without angina pectoris: Secondary | ICD-10-CM | POA: Diagnosis not present

## 2022-07-26 DIAGNOSIS — I5032 Chronic diastolic (congestive) heart failure: Secondary | ICD-10-CM | POA: Diagnosis not present

## 2022-07-26 DIAGNOSIS — F17210 Nicotine dependence, cigarettes, uncomplicated: Secondary | ICD-10-CM | POA: Insufficient documentation

## 2022-07-26 DIAGNOSIS — Z720 Tobacco use: Secondary | ICD-10-CM

## 2022-07-26 DIAGNOSIS — I10 Essential (primary) hypertension: Secondary | ICD-10-CM | POA: Diagnosis not present

## 2022-07-26 DIAGNOSIS — I4819 Other persistent atrial fibrillation: Secondary | ICD-10-CM | POA: Insufficient documentation

## 2022-07-26 LAB — BASIC METABOLIC PANEL
Anion gap: 13 (ref 5–15)
BUN: 21 mg/dL (ref 8–23)
CO2: 29 mmol/L (ref 22–32)
Calcium: 9.2 mg/dL (ref 8.9–10.3)
Chloride: 99 mmol/L (ref 98–111)
Creatinine, Ser: 1.07 mg/dL — ABNORMAL HIGH (ref 0.44–1.00)
GFR, Estimated: 55 mL/min — ABNORMAL LOW (ref >60)
Glucose, Bld: 95 mg/dL (ref 70–99)
Potassium: 4.1 mmol/L (ref 3.5–5.1)
Sodium: 141 mmol/L (ref 135–145)

## 2022-07-26 LAB — BRAIN NATRIURETIC PEPTIDE: B Natriuretic Peptide: 498.8 pg/mL — ABNORMAL HIGH (ref 0.0–100.0)

## 2022-07-26 MED ORDER — FUROSEMIDE 20 MG PO TABS: 20.0000 mg | ORAL_TABLET | Freq: Every day | ORAL | 3 refills | Status: DC | PRN

## 2022-07-26 NOTE — Patient Instructions (Signed)
Medication Instructions:  Your physician has recommended you make the following change in your medication:   START Furosemide 20 mg once daily for 3 days THEN AS NEEDED for swelling or 3 pound weight gain overnight.  TAKE Eliquis TWICE A DAY  *If you need a refill on your cardiac medications before your next appointment, please call your pharmacy*   Lab Work: BMET & BNP today at the [facility]. Check in at registration desk.   Repeat BMET in one week at the [facility].   If you have labs (blood work) drawn today and your tests are completely normal, you will receive your results only by: [place] (if you have MyChart) OR A paper copy in the mail If you have any lab test that is abnormal or we need to change your treatment, we will call you to review the results.   Testing/Procedures: None   Follow-Up: At [facility], you and your health needs are our priority.  As part of our continuing mission to provide you with exceptional heart care, we have created designated Provider Care Teams.  These Care Teams include your primary Cardiologist (physician) and Advanced Practice Providers (APPs -  Physician Assistants and Nurse Practitioners) who all work together to provide you with the care you need, when you need it.   Your next appointment:   1 month(s)  Provider:   Kathlyn Sacramento, MD or Christell Faith, PA-C

## 2022-07-27 NOTE — Addendum Note (Signed)
Addended by: Valora Corporal on: 07/27/2022 09:55 AM   Modules accepted: Orders

## 2022-08-01 ENCOUNTER — Other Ambulatory Visit: Payer: Self-pay | Admitting: Family

## 2022-08-01 DIAGNOSIS — Z1231 Encounter for screening mammogram for malignant neoplasm of breast: Secondary | ICD-10-CM

## 2022-08-15 ENCOUNTER — Ambulatory Visit: Payer: Medicare Other

## 2022-08-15 ENCOUNTER — Ambulatory Visit
Admission: RE | Admit: 2022-08-15 | Discharge: 2022-08-15 | Disposition: A | Discharge status: Home / Self Care | Payer: Medicare Other | Source: Ambulatory Visit | Attending: Acute Care | Admitting: Acute Care

## 2022-08-15 DIAGNOSIS — F17210 Nicotine dependence, cigarettes, uncomplicated: Secondary | ICD-10-CM | POA: Diagnosis not present

## 2022-08-15 DIAGNOSIS — Z122 Encounter for screening for malignant neoplasm of respiratory organs: Secondary | ICD-10-CM

## 2022-08-15 DIAGNOSIS — Z87891 Personal history of nicotine dependence: Secondary | ICD-10-CM | POA: Diagnosis not present

## 2022-08-18 NOTE — Progress Notes (Signed)
Cardiology Office Note    Date:  08/26/2022   ID:  Tracy, Bell 03-28-1951, MRN [id number]  PCP:  Allegra Grana, FNP  Cardiologist:  Lorine Bears, MD  Electrophysiologist:  None   Chief Complaint: Follow-up  History of Present Illness:   Tracy Bell is a 72 y.o. female with history of coronary artery calcification noted on CT imaging, persistent A-fib, atrial flutter diagnosed in 12/2019, SVT, second-degree AV block type I, mild to moderate aortic stenosis, ectatic aorta, carotid artery disease status post right-sided CEA in 07/2018, COPD, HTN, HLD, pulmonary nodules, and ongoing tobacco use who presents for follow-up of Afib.  She was previously followed by Dr. Darrold Junker with CT of the lungs showing evidence of coronary calcification.  Stress echo in 12/2015 showed normal LV systolic function with mild aortic stenosis and no evidence of ischemia with stress.  She had palpitations in 2019 with a 72-hour monitor showing sinus rhythm with frequent PACs and occasional brief atrial runs lasting up to 7 beats.  Echo from 04/2018 showed normal LV systolic function with mild to moderate aortic stenosis with a mean gradient of 13 mmHg and a valve area of 1.16 cm.   She transitioned her care to Dr. Kirke Corin in 05/2019 for evaluation of tachypalpitations.  Zio patch at that time showed sinus rhythm with an average rate of 65 bpm, sinus bradycardia during the early morning hours with the lowest heart rate of 43 bpm, frequent episodes of SVT totaling 100 runs with the longest episode lasting 15 minutes and 54 seconds, and intermittent Mobitz type I second-degree AV block in the early morning hours.  Echo in 2021 showed an EF of 60 to 65%, borderline LVH, grade 2 diastolic dysfunction, no regional wall motion abnormalities, normal RV systolic function, mild tricuspid regurgitation, mild to moderate aortic stenosis with a mean gradient of 14 mmHg and a valve area of 1.4 cm.  She was diagnosed  with atrial flutter in 12/2019 and initiated on apixaban.  She was noted to have converted to sinus rhythm with titration of diltiazem in follow-up in 01/2020.   With regards to her vascular disease, carotid artery ultrasound from 08/2021 showed 1 to 39% bilateral ICA stenoses with bilateral antegrade flow of the vertebral arteries and normal flow hemodynamics in the bilateral subclavian arteries.  Most recent AAA ultrasound from 09/2021 showed mild dilatation of the distal aorta proximal aorta measuring 3 cm and not aneurysmal.   She was evaluated by her PCP on 05/04/2022 for dizziness.  She was found to be in rate controlled A-fib at that time.  Given this finding, she followed up in our office on 05/05/2022 with continued dizziness that she indicated dated back to mid December 2023.  She reported sometimes forgetting to take her second dose of apixaban.  Given missed doses of apixaban, MRI of the brain was ordered and remains pending at this time.  To quantify A-fib burden, she underwent Zio patch which showed a 100% A-fib burden with an average ventricular rate of 91 bpm with a range of 42 to 189 bpm.  Echo from 05/2022 showed an EF of 60 to 65%, no regional wall motion abnormalities, mild LVH, normal RV systolic function and ventricular cavity size, mildly elevated PASP estimated at 37.7 mmHg, moderately dilated left atrium, mild mitral regurgitation, moderate aortic valve stenosis with a mean gradient of 18 mmHg and a valve area of 1.2 cm, and an estimated right atrial pressure of 3 mmHg.  She  was seen in the office in 06/2022 noting some improvement in her dizziness.  She remained in rate controlled A-fib.  She did continue to miss evening doses of apixaban and was not interested in transitioning to rivaroxaban.    She was last seen in the office on 07/26/2022 and had been more compliant with her apixaban, though still reported missing one dose every 1 to 2 weeks.  She was felt to be volume up with a weight that  was up 5 pounds when compared to prior visit.  BNP 498 at that time.  She was advised to take Lasix 20 mg daily for 3 days followed by as needed thereafter.  She comes in doing well from a cardiac perspective and is without symptoms of angina or cardiac decompensation.  Her dizziness has resolved.  No palpitations, dyspnea, presyncope, or syncope.  She does note some bilateral ankle swelling that is improved first thing in the morning and progresses throughout the day.  Not currently wearing compression socks.  Sits at a table doing art and playing on the computer.  At times will elevate legs if she is going to rest.  Her weight is down 5 pounds when compared to her last clinic visit.  She reports adherence to apixaban, not missing any doses since she was last seen, taking medication twice daily.  She does not want to move forward with DCCV.  Prefers to not change diltiazem.  Continues to smoke and is not interested in quitting.   Labs independently reviewed: 07/2022 - potassium 4.1, BUN 21, serum creatinine 1.07 05/2022 - Hgb 15.8, PLT 201 06/2021 - TC 137, TG 92, HDL 44, LDL 74, albumin 4.4, AST/ALT normal, TSH 6.71, free T4 normal 08/2020 - A1c 5.8  Past Medical History:  Diagnosis Date   Anxiety    COPD (chronic obstructive pulmonary disease) (HCC)    Cough    Depression    Dizziness    Dyspnea    Heart murmur    Hyperlipidemia    Hypertension    Lightheadedness    Personal history of tobacco use, presenting hazards to health 08/28/2015    Past Surgical History:  Procedure Laterality Date   ABDOMINAL HYSTERECTOMY  1980   complete   BREAST BIOPSY  1970   normal   DILATION AND CURETTAGE OF UTERUS     ENDARTERECTOMY Right 07/12/2018   Procedure: ENDARTERECTOMY CAROTID;  Surgeon: Annice Needy, MD;  Location: [facility] ORS;  Service: Vascular;  Laterality: Right;   TUBAL LIGATION      Current Medications: Current Meds  Medication Sig   apixaban (ELIQUIS) 5 MG TABS tablet Take 1 tablet (5  mg total) by mouth 2 (two) times daily.   atorvastatin (LIPITOR) 80 MG tablet Take 1 tablet (80 mg total) by mouth daily.   cholecalciferol (VITAMIN D3) 25 MCG (1000 UT) tablet Take 1,000 Units by mouth daily.   diltiazem (CARDIZEM CD) 360 MG 24 hr capsule Take 1 capsule (360 mg total) by mouth daily.   furosemide (LASIX) 20 MG tablet Take 1 tablet (20 mg total) by mouth daily as needed for edema (Take as needed for swelling or weight gain greater than 3 pounds overnight). TAKE Furosemide 20 mg once daily for 3 days THEN as needed   guaiFENesin (MUCINEX) 600 MG 12 hr tablet Take by mouth daily.   losartan (COZAAR) 50 MG tablet Take 1 tablet (50 mg total) by mouth daily.   Multiple Vitamins-Minerals (CENTRUM ADULTS) TABS Take 1 tablet by  mouth daily.   Tiotropium Bromide-Olodaterol (STIOLTO RESPIMAT) 2.5-2.5 MCG/ACT AERS Inhale 2 puffs into the lungs daily.    Allergies:   Patient has no known allergies.   Social History   Socioeconomic History   Marital status: Widowed    Spouse name: Not on file   Number of children: Not on file   Years of education: Not on file   Highest education level: Not on file  Occupational History   Occupation: retired  Tobacco Use   Smoking status: Every Day    Packs/day: 2.00    Years: 60.00    Additional pack years: 0.00    Total pack years: 120.00    Types: Cigarettes    Start date: 05/03/1963   Smokeless tobacco: Never   Tobacco comments:    1 PPD: 02/20/2022  Vaping Use   Vaping Use: Never used  Substance and Sexual Activity   Alcohol use: Yes    Alcohol/week: 0.0 standard drinks of alcohol    Comment: [place]umante occasionally   Drug use: No   Sexual activity: Not on file  Other Topics Concern   Not on file  Social History Narrative   Lives with husband. Has 11 pets in home. Has one son, lives locally.   Was born and raised in [place].   Husband is older than her and she provides care for him.      Work - retired. Invented water  bottle for pets   Social Determinants of Health   Financial Resource Strain: Medium Risk (04/13/2021)   Overall Financial Resource Strain (CARDIA)    Difficulty of Paying Living Expenses: Somewhat hard  Food Insecurity: Not on file  Transportation Needs: Not on file  Physical Activity: Not on file  Stress: Not on file  Social Connections: Not on file     Family History:  The patient's family history includes Cancer in her father and mother; Cancer (age of onset: 54) in her sister; Lung cancer in her sister and sister.  ROS:   12-point review of systems is negative unless otherwise noted in the HPI.   EKGs/Labs/Other Studies Reviewed:    Studies reviewed were summarized above. The additional studies were reviewed today:  Zio patch 05/2022: Atrial Fibrillation occurred continuously (100% burden), ranging from 42-189 bpm (avg of 91 bpm).  Rare PVCs. __________   2D echo 05/13/2022: 1. Left ventricular ejection fraction, by estimation, is 60 to 65%. The  left ventricle has normal function. The left ventricle has no regional  wall motion abnormalities. There is mild left ventricular hypertrophy.  Left ventricular diastolic parameters  are indeterminate.   2. Right ventricular systolic function is normal. The right ventricular  size is normal. There is mildly elevated pulmonary artery systolic  pressure. The estimated right ventricular systolic pressure is 37.7 mmHg.   3. Left atrial size was moderately dilated.   4. The mitral valve is normal in structure. Mild mitral valve  regurgitation. No evidence of mitral stenosis.   5. The aortic valve is normal in structure. Aortic valve regurgitation is  mild. Moderate aortic valve stenosis. Aortic valve area, by VTI measures  1.20 cm. Aortic valve mean gradient measures 18.0 mmHg. Aortic valve Vmax  measures 2.68 m/s.   6. The inferior vena cava is normal in size with greater than 50%  respiratory variability, suggesting right  atrial pressure of 3 mmHg.   Comparison(s): Previous Echo showed LV EF 60-65%, normal LV function, mild  to moderate AS, mean gradient 14 mmHg.  __________   2D echo 06/05/2019: 1. Left ventricular ejection fraction, by visual estimation, is 60 to  65%. The left ventricle has normal function. There is borderline left  ventricular hypertrophy.   2. Left ventricular diastolic parameters are consistent with Grade II  diastolic dysfunction (pseudonormalization).   3. The left ventricle has no regional wall motion abnormalities.   4. Global right ventricle has normal systolic function.The right  ventricular size is normal. Right vetricular wall thickness was not  assessed.   5. Left atrial size was normal.   6. Right atrial size was normal.   7. The mitral valve is normal in structure. No evidence of mitral valve  regurgitation.   8. The tricuspid valve is normal in structure.   9. The tricuspid valve is normal in structure. Tricuspid valve  regurgitation is mild.  10. The aortic valve is abnormal. Aortic valve regurgitation is not  visualized. Mild to moderate aortic valve stenosis.  11. Peak AV gradient , MG , DVI 0.45, with LVOT diameter of  2cm, AVA 1.4cm2.  12. The pulmonic valve was not well visualized. Pulmonic valve  regurgitation is not visualized.  13. Mildly elevated pulmonary artery systolic pressure.  14. The inferior vena cava is normal in size with greater than 50%  respiratory variability, suggesting right atrial pressure of 3 mmHg.  __________   Luci [place] patch 05/2019: Normal sinus rhythm with an average heart rate of 65 bpm. Sinus bradycardia during early morning hours with lowest heart rate of 43 bpm. Frequent episodes of SVT with a total of 100 runs.  The longest run lasted 50 minutes and 54 seconds. Intermittent Mobitz 1 second-degree AV block and early morning hours. Most triggered events correlated with SVT.   EKG:  EKG is not ordered today, patient  declined.   Recent Labs: 05/13/2022: Hemoglobin 15.8; Platelets 201 07/26/2022: B Natriuretic Peptide 498.8; BUN 21; Creatinine, Ser 1.07; Potassium 4.1; Sodium 141  Recent Lipid Panel    Component Value Date/Time   CHOL 137 07/12/2021 1400   TRIG 92.0 07/12/2021 1400   HDL 44.70 07/12/2021 1400   CHOLHDL 3 07/12/2021 1400   VLDL 18.4 07/12/2021 1400   LDLCALC 74 07/12/2021 1400    PHYSICAL EXAM:    VS:  BP (!) 130/90   Pulse 92   Ht 5\' 6"  (1.676 m)   Wt 163 lb (73.9 kg)   BMI 26.31 kg/m   BMI: Body mass index is 26.31 kg/m.  Physical Exam Vitals reviewed.  Constitutional:      Appearance: She is well-developed.  HENT:     Head: Normocephalic and atraumatic.  Eyes:     General:        Right eye: No discharge.        Left eye: No discharge.  Neck:     Vascular: No JVD.  Cardiovascular:     Rate and Rhythm: Normal rate. Rhythm irregularly irregular.     Pulses:          Posterior tibial pulses are 2+ on the right side and 2+ on the left side.     Heart sounds: S1 normal and S2 normal. Heart sounds not distant. No midsystolic click and no opening snap. Murmur heard.     Harsh midsystolic murmur is present with a grade of 2/6 at the upper right sternal border radiating to the neck.     No friction rub.  Pulmonary:     Effort: Pulmonary effort is normal. No respiratory distress.  Breath sounds: Normal breath sounds. No decreased breath sounds, wheezing or rales.  Chest:     Chest wall: No tenderness.  Abdominal:     General: There is no distension.  Musculoskeletal:     Cervical back: Normal range of motion.     Right lower leg: No edema.     Left lower leg: No edema.  Skin:    General: Skin is warm and dry.     Nails: There is no clubbing.  Neurological:     Mental Status: She is alert and oriented to person, place, and time.  Psychiatric:        Speech: Speech normal.        Behavior: Behavior normal.        Thought Content: Thought content normal.         Judgment: Judgment normal.     Wt Readings from Last 3 Encounters:  08/26/22 163 lb (73.9 kg)  07/26/22 168 lb 6.4 oz (76.4 kg)  06/20/22 164 lb (74.4 kg)     ASSESSMENT & PLAN:   Persistent A-fib/flutter: Declined EKG, rhythm irregularly irregular to auscultation in the office.  Discussed that diltiazem could be contributing to her lower extremity swelling and offered to transition to beta-blocker.  She declined at this transition.  At her request, continue Cardizem CD 360 mg daily.  CHA2DS2-VASc at least 4 (HTN, age x 1, vascular disease, sex category).  Continue apixaban 5 mg twice daily, she does not meet reduced dosing criteria.  No symptoms concerning for bleeding with recent labs noted to be stable.  We did discuss the longer she is in A-fib the more difficult it will be for [place] to pursue rhythm control strategy.  She declines DCCV.  For now, at her request, pursue rate control.  HFpEF: Euvolemic and well compensated.  Remains on as needed furosemide.  Coronary artery calcification: No symptoms suggestive of angina or cardiac decompensation.  Has declined ischemic testing in the past.  Remains on apixaban in place of aspirin given underlying A-fib/flutter.  Continue atorvastatin and risk factor modification including recommendation for complete smoking cessation.  Moderate aortic stenosis: Asymptomatic.  Slight progression of aortic stenosis on echo in 05/2022 when compared to study from 2021.  Repeat echo in 05/2023.  History of second-degree AV block type I: Noted during nocturnal hours.  Has declined sleep study.  No indication for PPM at this time.  Will need to monitor EKG closely should rhythm control strategy be pursued.  HTN: Blood pressure is reasonably controlled in the office today.  She remains on diltiazem and losartan.  HLD: LDL 74 in 06/2021.  She remains on atorvastatin 80 mg.  Carotid artery disease: Status post right-sided CEA.  Most recent carotid artery ultrasound  from 08/2021 showed bilateral 1 to 39% ICA stenoses.  She is on apixaban in place of aspirin given underlying A-fib/flutter as well as atorvastatin.  Vascular surgery.  Ectatic thoracic/abdominal aorta: Stable on most recent imaging.  Followed by vascular surgery.  Upper limits of normal ascending thoracic aorta on CT imaging 07/2021.  COPD with ongoing tobacco use: Stable, without acute exacerbation.  Complete smoking cessation is recommended.  She does not wish to quit smoking at this time, stating her "cigarettes will be with me until the day I die."  Pulmonary nodules: Followed by nodule clinic.    Disposition: F/u with Dr. Kirke Corin or an APP in 6 months.   Medication Adjustments/Labs and Tests Ordered: Current medicines are reviewed at length  with the patient today.  Concerns regarding medicines are outlined above. Medication changes, Labs and Tests ordered today are summarized above and listed in the Patient Instructions accessible in Encounters.   SignedEula Listen, PA-C 08/26/2022 11:43 AM     [facility] [place] [address] [place], [place] [address] [phone]

## 2022-08-26 ENCOUNTER — Encounter: Payer: Self-pay | Admitting: Physician Assistant

## 2022-08-26 ENCOUNTER — Ambulatory Visit: Payer: Medicare Other | Attending: Physician Assistant | Admitting: Physician Assistant

## 2022-08-26 VITALS — BP 130/90 | HR 92 | Ht 66.0 in | Wt 163.0 lb

## 2022-08-26 DIAGNOSIS — I5032 Chronic diastolic (congestive) heart failure: Secondary | ICD-10-CM

## 2022-08-26 DIAGNOSIS — I2510 Atherosclerotic heart disease of native coronary artery without angina pectoris: Secondary | ICD-10-CM | POA: Diagnosis not present

## 2022-08-26 DIAGNOSIS — I4819 Other persistent atrial fibrillation: Secondary | ICD-10-CM

## 2022-08-26 DIAGNOSIS — E785 Hyperlipidemia, unspecified: Secondary | ICD-10-CM | POA: Diagnosis not present

## 2022-08-26 DIAGNOSIS — Z720 Tobacco use: Secondary | ICD-10-CM

## 2022-08-26 DIAGNOSIS — I6523 Occlusion and stenosis of bilateral carotid arteries: Secondary | ICD-10-CM

## 2022-08-26 DIAGNOSIS — I4892 Unspecified atrial flutter: Secondary | ICD-10-CM | POA: Diagnosis not present

## 2022-08-26 DIAGNOSIS — I77819 Aortic ectasia, unspecified site: Secondary | ICD-10-CM | POA: Diagnosis not present

## 2022-08-26 DIAGNOSIS — I350 Nonrheumatic aortic (valve) stenosis: Secondary | ICD-10-CM | POA: Diagnosis not present

## 2022-08-26 DIAGNOSIS — J449 Chronic obstructive pulmonary disease, unspecified: Secondary | ICD-10-CM

## 2022-08-26 DIAGNOSIS — Z8679 Personal history of other diseases of the circulatory system: Secondary | ICD-10-CM

## 2022-08-26 DIAGNOSIS — I10 Essential (primary) hypertension: Secondary | ICD-10-CM | POA: Diagnosis not present

## 2022-08-26 DIAGNOSIS — R911 Solitary pulmonary nodule: Secondary | ICD-10-CM

## 2022-08-26 NOTE — Patient Instructions (Signed)
Medication Instructions:  Your Physician recommend you continue on your current medication as directed.    *If you need a refill on your cardiac medications before your next appointment, please call your pharmacy*   Lab Work: No labs ordered today.   Testing/Procedures: No testing ordered today.    Follow-Up: At [facility], you and your health needs are our priority.  As part of our continuing mission to provide you with exceptional heart care, we have created designated Provider Care Teams.  These Care Teams include your primary Cardiologist (physician) and Advanced Practice Providers (APPs -  Physician Assistants and Nurse Practitioners) who all work together to provide you with the care you need, when you need it.   Your next appointment:   6 month(s)  Provider:   Lorine Bears, MD or Eula Listen, PA-C

## 2022-08-29 ENCOUNTER — Telehealth: Payer: Self-pay | Admitting: Family

## 2022-08-29 ENCOUNTER — Encounter: Payer: Self-pay | Admitting: Family

## 2022-08-29 NOTE — Telephone Encounter (Signed)
close

## 2022-09-07 ENCOUNTER — Telehealth: Payer: Self-pay | Admitting: Acute Care

## 2022-09-07 NOTE — Telephone Encounter (Signed)
I have called the patient with the results of the low-dose screening CT.  I explained that her scan was read as a lung RADS 2, follow-up scan in 12 months.  This will be due in April 2024. The patient was sent a letter through epic and her primary care was faxed to result. There were 2 incidental findings on the scan. The first of which is a gallstone.  Her PCP FNP Jason Coop has recommended an ultrasound of the abdomen.  Patient is going to contact her PCP and let her know she is in agreement with an ultrasound to better evaluate the finding of the gallstone.  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  This is usually followed by patient's vascular surgeon Dr. Wyn Quaker.  She is due to see him in June 2024  however she has not made the appointment.  In his last note he had documented that there would need to be another ultrasound done in June 2024.  Patient stated that she was going to call his office now and get scheduled for follow-up in June.  Angelique Blonder I have  faxed scan results to Dr. Festus Barren.   12 month follow up low dose Ct Chest. Thanks so much

## 2022-09-08 ENCOUNTER — Other Ambulatory Visit: Payer: Self-pay

## 2022-09-08 DIAGNOSIS — F17210 Nicotine dependence, cigarettes, uncomplicated: Secondary | ICD-10-CM

## 2022-09-08 DIAGNOSIS — Z87891 Personal history of nicotine dependence: Secondary | ICD-10-CM

## 2022-09-08 NOTE — Telephone Encounter (Signed)
Order placed for annual LDCT 2025 

## 2022-09-16 ENCOUNTER — Other Ambulatory Visit: Payer: Self-pay | Admitting: Family

## 2022-09-16 DIAGNOSIS — K8020 Calculus of gallbladder without cholecystitis without obstruction: Secondary | ICD-10-CM

## 2022-09-27 ENCOUNTER — Ambulatory Visit
Admission: RE | Admit: 2022-09-27 | Discharge: 2022-09-27 | Disposition: A | Discharge status: Home / Self Care | Payer: Medicare Other | Source: Ambulatory Visit | Attending: Family | Admitting: Family

## 2022-09-27 DIAGNOSIS — K8020 Calculus of gallbladder without cholecystitis without obstruction: Secondary | ICD-10-CM | POA: Diagnosis not present

## 2022-09-29 ENCOUNTER — Other Ambulatory Visit: Payer: Self-pay | Admitting: Family

## 2022-10-04 ENCOUNTER — Encounter: Payer: Self-pay | Admitting: Family

## 2022-10-04 ENCOUNTER — Ambulatory Visit (INDEPENDENT_AMBULATORY_CARE_PROVIDER_SITE_OTHER): Payer: Medicare Other | Admitting: Family

## 2022-10-04 ENCOUNTER — Telehealth: Payer: Self-pay | Admitting: Family

## 2022-10-04 VITALS — BP 126/78 | HR 79 | Temp 98.3°F | Ht 66.0 in | Wt 159.8 lb

## 2022-10-04 DIAGNOSIS — I10 Essential (primary) hypertension: Secondary | ICD-10-CM

## 2022-10-04 DIAGNOSIS — K8020 Calculus of gallbladder without cholecystitis without obstruction: Secondary | ICD-10-CM | POA: Insufficient documentation

## 2022-10-04 DIAGNOSIS — K8081 Other cholelithiasis with obstruction: Secondary | ICD-10-CM

## 2022-10-04 DIAGNOSIS — Z7189 Other specified counseling: Secondary | ICD-10-CM | POA: Diagnosis not present

## 2022-10-04 NOTE — Assessment & Plan Note (Signed)
08/2022 RUQ [place] with cholelithiasis, without acute cholecystitis Fortunately she is asymptomatic.  She would prefers to have consult with general surgery discuss potential surgery if and when.   Referral has been placed

## 2022-10-04 NOTE — Assessment & Plan Note (Addendum)
We had a long discussion in regards to what DO NOT RESUSCITATE order would mean.  Patient does not want  the use of artificial or heroic means to be kept alive. Patient would like me to write an order in the chart that if patient was to die, no attempt to resuscitate will be made.  Patient doesn't want intubation, CPR, ACLS or defibrillation. She would like comfort care and has completed this in her Living will. Patient's  son [name] is her health care power of attorney.  All questions answered and patient verbalized understanding of all. I have signed DO NOT RESUSCITATE order and provided patient with original copy.  Counseled patient on the importance of keeping DNR paperwork at home and visible so that people around patient are aware.  We have scanned DO NOT RESUSCITATE order, advanced directives and living well.

## 2022-10-04 NOTE — Progress Notes (Signed)
Assessment & Plan:  Do not resuscitate discussion Assessment & Plan: We had a long discussion in regards to what DO NOT RESUSCITATE order would mean.  Patient does not want  the use of artificial or heroic means to be kept alive. Patient would like me to write an order in the chart that if patient was to die, no attempt to resuscitate will be made.  Patient doesn't want intubation, CPR, ACLS or defibrillation. She would like comfort care and has completed this in her Living will. Patient's  son Tracy Bell is her health care power of attorney.  All questions answered and patient verbalized understanding of all. I have signed DO NOT RESUSCITATE order and provided patient with original copy.  Counseled patient on the importance of keeping DNR paperwork at home and visible so that people around patient are aware.  We have scanned DO NOT RESUSCITATE order, advanced directives and living well.   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  She would prefers to have consult with general surgery discuss potential surgery if and when.   Referral has been placed  Orders: -     Ambulatory referral to General Surgery  Essential hypertension Assessment & Plan: Chronic, stable.  Continue losartan 50 mg, diltiazem 360mg  QD.       Return precautions given.   Risks, benefits, and alternatives of the medications and treatment plan prescribed today were discussed, and patient expressed understanding.   Education regarding symptom management and diagnosis given to patient on AVS either electronically or printed.  Return in about 3 months (around 01/04/2023).  Rennie Plowman, FNP  Subjective:    Patient ID: Tracy Bell, female    DOB: December 10, 1950, 72 y.o.   MRN: [id number]  CC: Tracy Bell is a 72 y.o. female who  presents today for follow up.   HPI: Feels well today.  No new complaints   she brings with her DNR signed with her today.   She also brought her advance directives, living will and HCPOA.   Son Tracy Bell is her HCPOA.  She a strong faith and is confident in her decision to for DNR order     08/2022 RUQ [place] with cholelithiasis, without acute cholecystitis  Denies CP, palpitations, abdominal pain, weight loss, nausea, pain with eating, epigastric pain.   Mucinex has not been helpful for chronic thick congestion.  Cough is at baseline.  She is a smoker   Known aortic stenosis.  Seeing echocardiogram 05/13/2022 Allergies: Patient has no known allergies. Current Outpatient Medications on File Prior to Visit  Medication Sig Dispense Refill   apixaban (ELIQUIS) 5 MG TABS tablet Take 1 tablet (5 mg total) by mouth 2 (two) times daily. 180 tablet 4   atorvastatin (LIPITOR) 80 MG tablet TAKE 1 TABLET BY MOUTH EVERY DAY 90 tablet 2   cholecalciferol (VITAMIN D3) 25 MCG (1000 UT) tablet Take 1,000 Units by mouth daily.     diltiazem (CARDIZEM CD) 360 MG 24 hr capsule Take 1 capsule (360 mg total) by mouth daily. 90 capsule 3   furosemide (LASIX) 20 MG tablet Take 1 tablet (20 mg total) by mouth daily as needed for edema (Take as needed for swelling or weight gain greater than 3 pounds overnight). TAKE Furosemide 20 mg once daily for 3 days THEN as needed 90 tablet  3   guaiFENesin (MUCINEX) 600 MG 12 hr tablet Take by mouth daily.     losartan (COZAAR) 50 MG tablet Take 1 tablet (50 mg total) by mouth daily. 90 tablet 3   Multiple Vitamins-Minerals (CENTRUM ADULTS) TABS Take 1 tablet by mouth daily. 30 tablet 6   Tiotropium Bromide-Olodaterol (STIOLTO RESPIMAT) 2.5-2.5 MCG/ACT AERS Inhale 2 puffs into the lungs daily. 4 g 3   No current facility-administered medications on file prior to visit.    Review of Systems  Constitutional:  Negative for chills and fever.  Respiratory:  Negative  for cough.   Cardiovascular:  Negative for chest pain and palpitations.  Gastrointestinal:  Negative for abdominal pain, nausea and vomiting.      Objective:    BP 126/78   Pulse 79   Temp 98.3 F (36.8 C) (Oral)   Ht 5\' 6"  (1.676 m)   Wt 159 lb 12.8 oz (72.5 kg)   SpO2 96%   BMI 25.79 kg/m  BP Readings from Last 3 Encounters:  10/04/22 126/78  08/26/22 (!) 130/90  07/26/22 (!) 150/89   Wt Readings from Last 3 Encounters:  10/04/22 159 lb 12.8 oz (72.5 kg)  08/26/22 163 lb (73.9 kg)  07/26/22 168 lb 6.4 oz (76.4 kg)    Physical Exam Vitals reviewed.  Constitutional:      Appearance: She is well-developed.  Eyes:     Conjunctiva/sclera: Conjunctivae normal.  Cardiovascular:     Rate and Rhythm: Normal rate and regular rhythm.     Pulses: Normal pulses.     Heart sounds: Murmur heard.     Systolic murmur is present with a grade of 2/6.     Comments: SEM II/VI, Loudest LSB, non radiating, no thrill  Pulmonary:     Effort: Pulmonary effort is normal.     Breath sounds: Normal breath sounds. No wheezing, rhonchi or rales.  Skin:    General: Skin is warm and dry.  Neurological:     Mental Status: She is alert.  Psychiatric:        Speech: Speech normal.        Behavior: Behavior normal.        Thought Content: Thought content normal.

## 2022-10-04 NOTE — Assessment & Plan Note (Addendum)
Chronic, stable.  Continue losartan 50 mg, diltiazem 360mg  QD.

## 2022-10-04 NOTE — Patient Instructions (Addendum)
Trial of Claritin or zyrtec for thick congestion  Referral to general surgery  Let us know if you dont hear back within a week in regards to an appointment being scheduled.   So that you are aware, if you are [facility] MyChart user , please pay attention to your MyChart messages as you may receive a MyChart message with a phone number to call and schedule this test/appointment own your own from our referral coordinator. This is a new process so I do not want you to miss this message.  If you are not a MyChart user, you will receive a phone call.     Nice to see you!

## 2022-10-04 NOTE — Telephone Encounter (Signed)
Pt would like a copy of her AVS from today mailed to her

## 2022-10-06 ENCOUNTER — Ambulatory Visit (INDEPENDENT_AMBULATORY_CARE_PROVIDER_SITE_OTHER): Payer: Medicare Other

## 2022-10-06 ENCOUNTER — Ambulatory Visit (INDEPENDENT_AMBULATORY_CARE_PROVIDER_SITE_OTHER): Payer: Medicare Other | Admitting: Nurse Practitioner

## 2022-10-06 ENCOUNTER — Encounter (INDEPENDENT_AMBULATORY_CARE_PROVIDER_SITE_OTHER): Payer: Self-pay | Admitting: Nurse Practitioner

## 2022-10-06 VITALS — BP 133/94 | HR 88 | Resp 18 | Ht 66.0 in | Wt 159.4 lb

## 2022-10-06 DIAGNOSIS — I6523 Occlusion and stenosis of bilateral carotid arteries: Secondary | ICD-10-CM | POA: Diagnosis not present

## 2022-10-06 DIAGNOSIS — I714 Abdominal aortic aneurysm, without rupture, unspecified: Secondary | ICD-10-CM

## 2022-10-06 DIAGNOSIS — I700 Atherosclerosis of aorta: Secondary | ICD-10-CM | POA: Diagnosis not present

## 2022-10-06 DIAGNOSIS — I10 Essential (primary) hypertension: Secondary | ICD-10-CM | POA: Diagnosis not present

## 2022-10-06 DIAGNOSIS — M79604 Pain in right leg: Secondary | ICD-10-CM | POA: Diagnosis not present

## 2022-10-06 DIAGNOSIS — F17200 Nicotine dependence, unspecified, uncomplicated: Secondary | ICD-10-CM

## 2022-10-06 DIAGNOSIS — M79605 Pain in left leg: Secondary | ICD-10-CM | POA: Diagnosis not present

## 2022-10-06 DIAGNOSIS — M7989 Other specified soft tissue disorders: Secondary | ICD-10-CM | POA: Diagnosis not present

## 2022-10-06 NOTE — Progress Notes (Signed)
Subjective:    Patient ID: Tracy Bell, female    DOB: 1950/09/08, 72 y.o.   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  She is doing reasonably well today. She is status post right carotid endarterectomy several years ago.  No focal neurologic symptoms. Specifically, the patient denies amaurosis fugax, speech or swallowing difficulties, or arm or leg weakness or numbness.  Carotid duplex today shows a widely patent right carotid endarterectomy and 1 to 39% left ICA stenosis. She is also followed for an abdominal aortic aneurysm.  No aneurysm related symptoms since her last visit. Specifically, the patient denies new back or abdominal pain, or signs of peripheral embolization.  Today the abdominal aortic aneurysm measures about 2.9 cm which is slightly smaller than previous studies.  But we do not measure his around 4 cm from previous studies.  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  He does endorse having fatigue weakness in her hip area.  This is consistent with claudication-like symptoms.  She also notes having some swelling in the lower extremities as well.    Review of Systems  Cardiovascular:  Positive for leg swelling.  Neurological:  Positive for weakness and numbness.  All other systems reviewed and are negative.      Objective:   Physical Exam Vitals reviewed.  HENT:     Head: Normocephalic.  Cardiovascular:     Rate and Rhythm: Normal rate.  Pulmonary:     Effort: Pulmonary effort is normal.  Skin:    General: Skin is warm and dry.  Neurological:     Mental Status: She is alert and oriented to person, place, and time.  Psychiatric:        Mood and Affect: Mood normal.        Behavior: Behavior normal.        Thought Content: Thought content normal.        Judgment: Judgment normal.     BP (!)  133/94 (BP Location: Left Arm)   Pulse 88   Resp 18   Ht 5\' 6"  (1.676 m)   Wt 159 lb 6.4 oz (72.3 kg)   BMI 25.73 kg/m   Past Medical History:  Diagnosis Date   Anxiety    COPD (chronic obstructive pulmonary disease) (HCC)    Cough    Depression    Dizziness    Dyspnea    Heart murmur    Hyperlipidemia    Hypertension    Lightheadedness    Personal history of tobacco use, presenting hazards to health 08/28/2015    Social History   Socioeconomic History   Marital status: Widowed    Spouse name: Not on file   Number of children: Not on file   Years of education: Not on file   Highest education level: Not on file  Occupational History   Occupation: retired  Tobacco Use   Smoking status: Every Day    Packs/day: 2.00    Years: 60.00    Additional pack years: 0.00    Total pack years: 120.00    Types: Cigarettes    Start date: 05/03/1963   Smokeless tobacco: Never   Tobacco comments:    1 PPD: 02/20/2022  Vaping Use   Vaping Use: Never used  Substance and Sexual Activity   Alcohol use:  Yes    Alcohol/week: 0.0 standard drinks of alcohol    Comment: [place] occasionally   Drug use: No   Sexual activity: Not on file  Other Topics Concern   Not on file  Social History Narrative   Lives with husband. Has 11 pets in home. Has one son, lives locally.   Was born and raised in [place].   Husband is older than her and she provides care for him.      Work - retired. Invented water bottle for pets   Social Determinants of Health   Financial Resource Strain: Medium Risk (04/13/2021)   Overall Financial Resource Strain (CARDIA)    Difficulty of Paying Living Expenses: Somewhat hard  Food Insecurity: Not on file  Transportation Needs: Not on file  Physical Activity: Not on file  Stress: Not on file  Social Connections: Not on file  Intimate Partner Violence: Not on file    Past Surgical History:  Procedure Laterality Date   ABDOMINAL HYSTERECTOMY  1980    complete   BREAST BIOPSY  1970   normal   DILATION AND CURETTAGE OF UTERUS     ENDARTERECTOMY Right 07/12/2018   Procedure: ENDARTERECTOMY CAROTID;  Surgeon: Annice Needy, MD;  Location: [facility] ORS;  Service: Vascular;  Laterality: Right;   TUBAL LIGATION      Family History  Problem Relation Age of Onset   Cancer Mother        Pancreatic Cancer    Cancer Father        Stomach Cancer    Lung cancer Sister    Cancer Sister 58       ovarian   Lung cancer Sister     No Known Allergies     Latest Ref Rng & Units 05/13/2022   10:50 AM 01/17/2020   10:35 AM 12/23/2019   10:38 AM  CBC  WBC 4.0 - 10.5 K/uL 9.2  8.1  9.1   Hemoglobin 12.0 - 15.0 g/dL 53.6  64.4  03.4   Hematocrit 36.0 - 46.0 % 48.5  44.7  47.1   Platelets 150 - 400 K/uL 201  210.0  219       CMP     Component Value Date/Time   NA 141 07/26/2022 1208   K 4.1 07/26/2022 1208   CL 99 07/26/2022 1208   CO2 29 07/26/2022 1208   GLUCOSE 95 07/26/2022 1208   BUN 21 07/26/2022 1208   CREATININE 1.07 (H) 07/26/2022 1208   CALCIUM 9.2 07/26/2022 1208   PROT 7.0 07/12/2021 1400   ALBUMIN 4.4 07/12/2021 1400   AST 19 07/12/2021 1400   ALT 21 07/12/2021 1400   ALKPHOS 76 07/12/2021 1400   BILITOT 0.7 07/12/2021 1400   GFR 47.89 (L) 07/12/2021 1400   GFRNONAA 55 (L) 07/26/2022 1208     No results found.     Assessment & Plan:   1. Atherosclerosis of aorta (HCC) Today noninvasive studies show a moderate to severe stenosis greater than 70% in the bilateral iliac arteries.  This is consistent with the hip pain that she is having with activity.  We discussed typical intervention for this including angiogram.  However at this time patient is not interested with moving forward as she only has claudication symptoms and she feels that these are relatively well-controlled.  We discussed more worrisome symptoms such as worsening claudication, falls or worsening weakness.  Additionally the patient has open wounds or  ulcerations that are not healing this is  an indication to evaluate sooner.  We discussed the possibility of following up in 6 months however the patient prefers annual follow-ups.  Again, the patient is advised to follow-up sooner if there are issues.  2. Abdominal aneurysm (HCC) Duplex today shows fairly consistent size at about 2.9 cm.  However initial CT measured it at about 3.2 cm.  There was previous duplex which showed it to be approximately 4.2 cm.  The measurements last year were also around 2.9 cm.  It does not appear that it significantly grown.  We will check in 1 year per patient preference.  3. Pain in both lower extremities The patient has some numbness and tingling she notes in her shin area.  The description does not sound like it is entirely vascular however given her recent study showing significant iliac level disease, there is certainly the possibility.  I discussed with the patient that in order to have a more thorough look evaluation she need to have ABIs so that we could have an idea of her distal perfusion.  We discussed what that would involve and the patient elected not to move forward at this time but she will contact us if her issues worsen and she wanted to move forward with studies.  4. Tobacco use disorder Smoking cessation was discussed, 3-10 minutes spent on this topic specifically  5. Essential hypertension Continue antihypertensive medications as already ordered, these medications have been reviewed and there are no changes at this time.  6. Leg swelling The patient has some swelling that she notes.  Although today there is any significant edema.  She does have some spider varicosities noted bilaterally.  In order to fully evaluate the cause of the lower extremity edema patient will need bilateral venous reflux.  We discussed scheduling but the patient notes that she will contact us if she wishes to move forward with further evaluation.   Current Outpatient  Medications on File Prior to Visit  Medication Sig Dispense Refill   apixaban (ELIQUIS) 5 MG TABS tablet Take 1 tablet (5 mg total) by mouth 2 (two) times daily. 180 tablet 4   atorvastatin (LIPITOR) 80 MG tablet TAKE 1 TABLET BY MOUTH EVERY DAY 90 tablet 2   cholecalciferol (VITAMIN D3) 25 MCG (1000 UT) tablet Take 1,000 Units by mouth daily.     diltiazem (CARDIZEM CD) 360 MG 24 hr capsule Take 1 capsule (360 mg total) by mouth daily. 90 capsule 3   furosemide (LASIX) 20 MG tablet Take 1 tablet (20 mg total) by mouth daily as needed for edema (Take as needed for swelling or weight gain greater than 3 pounds overnight). TAKE Furosemide 20 mg once daily for 3 days THEN as needed 90 tablet 3   guaiFENesin (MUCINEX) 600 MG 12 hr tablet Take by mouth daily.     losartan (COZAAR) 50 MG tablet Take 1 tablet (50 mg total) by mouth daily. 90 tablet 3   Multiple Vitamins-Minerals (CENTRUM ADULTS) TABS Take 1 tablet by mouth daily. 30 tablet 6   Tiotropium Bromide-Olodaterol (STIOLTO RESPIMAT) 2.5-2.5 MCG/ACT AERS Inhale 2 puffs into the lungs daily. 4 g 3   No current facility-administered medications on file prior to visit.    There are no Patient Instructions on file for this visit. No follow-ups on file.   Georgiana Spinner, NP

## 2022-10-06 NOTE — Telephone Encounter (Signed)
AVS mailed to pt on 10/06/22

## 2022-10-10 NOTE — Progress Notes (Unsigned)
Patient ID: Tracy Bell, female   DOB: 1950/08/01, 72 y.o.   MRN: [id number]  Chief Complaint: Gallstones  History of Present Illness Tracy Bell is a 72 y.o. female with presentation today with apparently asymptomatic gallstones.  Reviewed likely presentations of gallstones and the wide range of symptoms that may be related.  She currently feels she has no GI issues, has no postprandial pain, and has no dietary limitations.  We discussed the role of elective cholecystectomy and the statistical increase in risk of complications from her gallstones with time.  Making it clear this was not a scare tactic just to make her informed that having symptoms would clearly justify proceeding with surgery, and aware what types of symptoms may be related or secondary to biliary colic.  She denies any history of jaundice, hepatitis, fever, chills, nausea or fatty food intolerance.  Past Medical History Past Medical History:  Diagnosis Date   Anxiety    COPD (chronic obstructive pulmonary disease) (HCC)    Cough    Depression    Dizziness    Dyspnea    Heart murmur    Hyperlipidemia    Hypertension    Lightheadedness    Personal history of tobacco use, presenting hazards to health 08/28/2015      Past Surgical History:  Procedure Laterality Date   ABDOMINAL HYSTERECTOMY  1980   complete   BREAST BIOPSY  1970   normal   DILATION AND CURETTAGE OF UTERUS     ENDARTERECTOMY Right 07/12/2018   Procedure: ENDARTERECTOMY CAROTID;  Surgeon: Annice Needy, MD;  Location: [facility] ORS;  Service: Vascular;  Laterality: Right;   TUBAL LIGATION      No Known Allergies  Current Outpatient Medications  Medication Sig Dispense Refill   apixaban (ELIQUIS) 5 MG TABS tablet Take 1 tablet (5 mg total) by mouth 2 (two) times daily. 180 tablet 4   atorvastatin (LIPITOR) 80 MG tablet TAKE 1 TABLET BY MOUTH EVERY DAY 90 tablet 2   cholecalciferol (VITAMIN D3) 25 MCG (1000 UT) tablet Take 1,000 Units by mouth  daily.     diltiazem (CARDIZEM CD) 360 MG 24 hr capsule Take 1 capsule (360 mg total) by mouth daily. 90 capsule 3   furosemide (LASIX) 20 MG tablet Take 1 tablet (20 mg total) by mouth daily as needed for edema (Take as needed for swelling or weight gain greater than 3 pounds overnight). TAKE Furosemide 20 mg once daily for 3 days THEN as needed 90 tablet 3   guaiFENesin (MUCINEX) 600 MG 12 hr tablet Take by mouth daily.     losartan (COZAAR) 50 MG tablet Take 1 tablet (50 mg total) by mouth daily. 90 tablet 3   Multiple Vitamins-Minerals (CENTRUM ADULTS) TABS Take 1 tablet by mouth daily. 30 tablet 6   Tiotropium Bromide-Olodaterol (STIOLTO RESPIMAT) 2.5-2.5 MCG/ACT AERS Inhale 2 puffs into the lungs daily. 4 g 3   No current facility-administered medications for this visit.    Family History Family History  Problem Relation Age of Onset   Cancer Mother        Pancreatic Cancer    Cancer Father        Stomach Cancer    Lung cancer Sister    Cancer Sister 44       ovarian   Lung cancer Sister       Social History Social History   Tobacco Use   Smoking status: Every Day    Packs/day: 2.00  Years: 60.00    Additional pack years: 0.00    Total pack years: 120.00    Types: Cigarettes    Start date: 05/03/1963   Smokeless tobacco: Never   Tobacco comments:    1 PPD: 02/20/2022  Vaping Use   Vaping Use: Never used  Substance Use Topics   Alcohol use: Yes    Alcohol/week: 0.0 standard drinks of alcohol    Comment: [place] occasionally   Drug use: No        Review of Systems  Constitutional: Negative.   HENT: Negative.    Eyes:  Positive for blurred vision.  Respiratory:  Positive for cough.   Cardiovascular:  Positive for leg swelling.  Gastrointestinal: Negative.   Genitourinary: Negative.   Skin: Negative.   Neurological: Negative.   Psychiatric/Behavioral: Negative.       Physical Exam Blood pressure (!) 142/98, pulse (!) 109, temperature 97.8 F  (36.6 C), temperature source Oral, height 5\' 6"  (1.676 m), weight 157 lb 6.4 oz (71.4 kg), SpO2 93 %. Last Weight  Most recent update: 10/11/2022 10:56 AM    Weight  71.4 kg (157 lb 6.4 oz)             CONSTITUTIONAL: Well developed, and nourished, appropriately responsive and aware without distress.   EYES: Sclera non-icteric.   EARS, NOSE, MOUTH AND THROAT:  The oropharynx is clear. Oral mucosa is pink and moist.    Hearing is intact to voice.  NECK: Trachea is midline, and there is no jugular venous distension.  LYMPH NODES:  Lymph nodes in the neck are not appreciated. RESPIRATORY:  Lungs are clear, and breath sounds are equal bilaterally.  Normal respiratory effort without pathologic use of accessory muscles. CARDIOVASCULAR: Heart is regular in rate and rhythm.   Well perfused.  GI: The abdomen is  soft, nontender, and nondistended. There were no palpable masses.  I did not appreciate hepatosplenomegaly.  MUSCULOSKELETAL:  Symmetrical muscle tone appreciated in all four extremities.    SKIN: Skin turgor is normal. No pathologic skin lesions appreciated.  NEUROLOGIC:  Motor and sensation appear grossly normal.  Cranial nerves are grossly without defect. PSYCH:  Alert and oriented to person, place and time. Affect is appropriate for situation.  Data Reviewed I have personally reviewed what is currently available of the patient's imaging, recent labs and medical records.   Labs:     Latest Ref Rng & Units 05/13/2022   10:50 AM 01/17/2020   10:35 AM 12/23/2019   10:38 AM  CBC  WBC 4.0 - 10.5 K/uL 9.2  8.1  9.1   Hemoglobin 12.0 - 15.0 g/dL 09.8  11.9  14.7   Hematocrit 36.0 - 46.0 % 48.5  44.7  47.1   Platelets 150 - 400 K/uL 201  210.0  219       Latest Ref Rng & Units 07/26/2022   12:08 PM 05/13/2022   10:50 AM 07/12/2021    2:00 PM  CMP  Glucose 70 - 99 mg/dL 95  94  93   BUN 8 - 23 mg/dL 21  16  18    Creatinine 0.44 - 1.00 mg/dL 8.29  5.62  1.30   Sodium 135 - 145 mmol/L  141  139  140   Potassium 3.5 - 5.1 mmol/L 4.1  4.0  4.3   Chloride 98 - 111 mmol/L 99  103  103   CO2 22 - 32 mmol/L 29  28  31    Calcium 8.9 -  10.3 mg/dL 9.2  8.9  9.3   Total Protein 6.0 - 8.3 g/dL   7.0   Total Bilirubin 0.2 - 1.2 mg/dL   0.7   Alkaline Phos 39 - 117 U/L   76   AST 0 - 37 U/L   19   ALT 0 - 35 U/L   21       Imaging: Radiological images reviewed:  CLINICAL DATA:  Cholelithiasis   EXAM: ULTRASOUND ABDOMEN LIMITED RIGHT UPPER QUADRANT   COMPARISON:  CT chest 08/15/2022   FINDINGS: Gallbladder:   Large stone in the gallbladder lumen measuring 2.3 cm. There is associated sludge. No gallbladder wall thickening or pericholecystic fluid. Negative sonographic Murphy's sign.   Common bile duct:   Diameter: 2.9 mm   Liver:   No focal lesion identified. Within normal limits in parenchymal echogenicity. Portal vein is patent on color Doppler imaging with normal direction of blood flow towards the liver.   Other: None.   IMPRESSION: Cholelithiasis without secondary signs of acute cholecystitis.     Electronically Signed   By: Annia Belt M.D.   On: 09/27/2022 13:26 Within last 24 hrs: No results found.  Assessment     Patient Active Problem List   Diagnosis Date Noted   Cholelithiasis 10/04/2022   Atrial fibrillation (HCC) 07/18/2022   Subclinical hypothyroidism 07/14/2021   Pain in limb 10/28/2020   Abnormal findings on diagnostic imaging of lung 06/18/2020   Leg pain, lateral, left 06/12/2020   Healthcare maintenance 01/13/2020   Do not resuscitate discussion 12/03/2019   Lymphadenopathy 10/14/2019   Urinary incontinence 10/14/2019   SVT (supraventricular tachycardia) 06/21/2019   Palpitations 07/27/2018   Carotid stenosis, right 07/12/2018   Carotid stenosis, bilateral 05/10/2018   Premature atrial contractions 05/01/2018   Erythrocytosis 04/14/2018   Left carotid bruit 04/10/2018   Thoracic aortic aneurysm without rupture (HCC)  04/10/2018   SOB (shortness of breath) on exertion 04/05/2018   Depression, recurrent (HCC) 03/23/2018   Dizziness 03/23/2018   Abdominal aneurysm (HCC) 03/23/2018   Chest pain with high risk for cardiac etiology 12/15/2015   Stage 2 moderate COPD by GOLD classification (HCC) 12/15/2015   Nodule of left lung 11/17/2015   Hypercholesteremia 11/17/2015   Coronary atherosclerosis 11/05/2015   Personal history of tobacco use, presenting hazards to health 08/28/2015   Adjustment disorder with mixed anxiety and depressed mood 09/30/2014   Essential hypertension 05/22/2014   Atherosclerosis of aorta (HCC) 04/17/2014   Aortic stenosis, mild 04/17/2014   Tobacco use disorder 03/14/2014   Special screening for malignant neoplasms, colon 03/14/2014   Murmur 03/14/2014   Dysphagia 03/14/2014   Tobacco dependence syndrome 03/14/2014    Plan    We discussed the role of elective cholecystectomy, and she has no interest in proceeding with surgery at this time.  We reviewed the risk of delaying treatment should she develop symptoms, and potential manners in which gallstones may become complicated.  I believe she understands and remains readily available to follow-up with [place] should there be any new symptoms arise or any change in her heart her mind regarding proceeding with surgery.  Face-to-face time spent with the patient and accompanying care providers(if present) was 20 minutes, with more than 50% of the time spent counseling, educating, and coordinating care of the patient.    These notes generated with voice recognition software. I apologize for typographical errors.  Campbell Lerner M.D., FACS 10/12/2022, 9:44 PM

## 2022-10-11 ENCOUNTER — Encounter: Payer: Self-pay | Admitting: Surgery

## 2022-10-11 ENCOUNTER — Ambulatory Visit: Payer: Medicare Other | Admitting: Surgery

## 2022-10-11 VITALS — BP 142/98 | HR 109 | Temp 97.8°F | Ht 66.0 in | Wt 157.4 lb

## 2022-10-11 DIAGNOSIS — K8020 Calculus of gallbladder without cholecystitis without obstruction: Secondary | ICD-10-CM

## 2022-10-11 NOTE — Patient Instructions (Addendum)
Advised to pursue a goal of 25 to 30 g of fiber daily.  Made aware that the majority of this may be through natural sources, but advised to be aware of actual consumption and to ensure minimal consumption by daily supplementation.  Various forms of supplements discussed.  Recommended Psyllium husk, that mixes well with applesauce, or the powder which goes down well shaken with chocolate milk.  Strongly advised to consume more fluids to ensure adequate hydration, instructed to watch color of urine to determine adequacy of hydration.  Clarity is pursued in urine output, and bowel activity that correlates to significant meal intake.   We need to avoid deferring having bowel movements, advised to take the time at the first sign of sensation, typically following meals, and in the morning.   Subsequent utilization of MiraLAX may be needed ensure at least daily movement, ideally twice daily bowel movements.  If multiple doses of MiraLAX are necessary utilize them. Never skip a day...  To be regular, we must do the above EVERY day.    If you have any concerns or questions, please feel free to call our office.   Cholelithiasis  Cholelithiasis happens when gallstones form in the gallbladder. The gallbladder stores bile. Bile is a fluid that helps digest fats. Bile can harden and form into gallstones. If they cause a blockage, they can cause pain (gallbladder attack). What are the causes? This condition may be caused by: Too much bilirubin in the bile. This happens if you have sickle cell anemia. Too much of a fat-like substance (cholesterol) in your bile. Not enough bile salts in your bile. These salts help the body absorb and digest fats. The gallbladder not emptying fully or often enough. This is common in pregnant women. What increases the risk? The following factors may make you more likely to develop this condition: Being older than age 37. Eating a lot of fried foods, fat, and refined carbs  (refined carbohydrates). Being female. Being pregnant many times. Using medicines with female hormones in them for a long time. Losing weight fast. Having gallstones in your family. Having health problems, such as diabetes, obesity, Crohn's disease, or liver disease. What are the signs or symptoms? Often, there may be gallstones but no symptoms. These gallstones are called silent gallstones. If a gallstone causes a blockage, you may get sudden pain. The pain: Can be in the upper right part of your belly (abdomen). Normally comes at night or after you eat. Can last an hour or more. Can spread to your right shoulder, back, or chest. Can feel like discomfort, burning, or fullness in the upper part of your belly (indigestion). If the blockage lasts more than a few hours, you can get an infection or swelling. You may: Vomit or feel like you may vomit (nauseous). Feel bloated. Have belly pain for 5 hours or more. Feel tender in your belly, often in the upper right part and under your ribs. Have a fever or chills. Have skin or the white parts of your eyes turn yellow (jaundice). Have dark pee (urine) or pale poop (stool). How is this treated? Treatment for this condition depends on how bad you feel. If you have symptoms, you may need: Home care, if symptoms are not very bad. Do not eat for 12-24 hours. Drink only water and clear liquids. After 1 or 2 days, start to eat simple or clear foods. Try broth and crackers. You may need medicines for pain or stomach upset or both. If you  have an infection, you will need antibiotics. A hospital stay, if you have very bad pain or a very bad infection. Surgery to remove your gallbladder. You may need this if: Gallstones keep coming back. You have very bad symptoms. Medicines to break up gallstones. Medicines may be used for 6-12 months. A procedure to find and take out gallstones or to break up gallstones. Follow these instructions at  home: Medicines Take over-the-counter and prescription medicines only as told by your doctor. If you were prescribed antibiotics, take them as told by your doctor. Do not stop taking them even if you start to feel better. Ask your doctor if you should avoid driving or using machines while you are taking your medicine. Eating and drinking Drink enough fluid to keep your pee pale yellow. Drink water or clear fluids. This is important when you have pain. Eat healthy foods. Choose: Fewer fatty foods, such as fried foods. Fewer refined carbs. Avoid breads and grains that are highly processed, such as white bread and white rice. Choose whole grains, such as whole-wheat bread and brown rice. More fiber. Almonds, fresh fruit, and beans are healthy sources. General instructions Keep a healthy weight. Keep all follow-up visits. You may need to see a specialist or a Careers adviser. Where to find more information [employer] of Diabetes and Digestive and Kidney Diseases: [email or web address] Contact a doctor if: You have sudden pain in the upper right part of your belly. Pain might spread to your right shoulder, back, or chest. Your pain lasts more than 2 hours. You have been diagnosed with gallstones that have no symptoms and you get: Belly pain. Discomfort, burning, or fullness in the upper part of your abdomen. You keep feeling like you may vomit. You have dark pee or pale poop. Get help right away if: You have pain in your abdomen, that: Lasts more than 5 hours. Keeps getting worse. You have a fever or chills. You can't stop vomiting. Your skin or the white parts of your eyes turn yellow. This information is not intended to replace advice given to you by your health care provider. Make sure you discuss any questions you have with your health care provider. Document Revised: 01/31/2022 Document Reviewed: 01/31/2022 Elsevier Patient Education  2024 [employer].

## 2022-10-15 ENCOUNTER — Other Ambulatory Visit: Payer: Self-pay | Admitting: Family

## 2022-10-15 DIAGNOSIS — I10 Essential (primary) hypertension: Secondary | ICD-10-CM

## 2022-11-22 DIAGNOSIS — R0609 Other forms of dyspnea: Secondary | ICD-10-CM | POA: Diagnosis not present

## 2022-11-28 ENCOUNTER — Encounter: Payer: Self-pay | Admitting: Physician Assistant

## 2022-12-13 DIAGNOSIS — J449 Chronic obstructive pulmonary disease, unspecified: Secondary | ICD-10-CM | POA: Diagnosis not present

## 2023-02-22 DIAGNOSIS — M7531 Calcific tendinitis of right shoulder: Secondary | ICD-10-CM | POA: Diagnosis not present

## 2023-02-22 DIAGNOSIS — M7501 Adhesive capsulitis of right shoulder: Secondary | ICD-10-CM | POA: Diagnosis not present

## 2023-03-06 ENCOUNTER — Encounter: Payer: Self-pay | Admitting: Family

## 2023-03-06 ENCOUNTER — Telehealth: Payer: Self-pay

## 2023-03-06 DIAGNOSIS — B370 Candidal stomatitis: Secondary | ICD-10-CM

## 2023-03-06 MED ORDER — CLOTRIMAZOLE 10 MG MT TROC
10.0000 mg | Freq: Three times a day (TID) | OROMUCOSAL | 0 refills | Status: DC
Start: 2023-03-06 — End: 2023-03-10

## 2023-03-06 NOTE — Telephone Encounter (Signed)
Call patient I have sent an antifungal atrocious medication for her to take for suspected thrush.  Please remind her to rinse her mouth out after use of inhaler which I am sure she already knows. Please encourage her see provider in our office sooner if symptoms do not improve and not to delay her care

## 2023-03-06 NOTE — Telephone Encounter (Signed)
Patient states she is calling to schedule an appointment with Rennie Plowman, FNP.  Patient states she has a multitude of things going on.  Patient states she will be seen for lost taste buds, oral thrush, very dry skin, hair, toenail and fingernail issues.  Patient states she only wants to see Rennie Plowman, FNP.  Patient is not available at the times Rennie Plowman, FNP, has available tomorrow.  I scheduled patient for the next available office visit with Claris Che that works with her schedule, which is 03/15/2023.  Patient states she is losing weight because she cannot taste her food.

## 2023-03-06 NOTE — Telephone Encounter (Signed)
Spoke to pt and scheduled her an appt for 03/10/23 but she would like to know if you can send in Fluconazole today for her mouth thrush

## 2023-03-06 NOTE — Telephone Encounter (Signed)
Spoke to patient notified her that   I have sent an antifungal atrocious medication for her to take for suspected thrush.  Please remind her to rinse her mouth out after use of inhaler which I am sure she already knows. Please encourage her see provider in our office sooner if symptoms do not improve and not to delay her care.  pt was offered sooner appt but that is the earliest she can come in

## 2023-03-07 DIAGNOSIS — M25511 Pain in right shoulder: Secondary | ICD-10-CM | POA: Diagnosis not present

## 2023-03-10 ENCOUNTER — Encounter: Payer: Self-pay | Admitting: Family

## 2023-03-10 ENCOUNTER — Ambulatory Visit (INDEPENDENT_AMBULATORY_CARE_PROVIDER_SITE_OTHER): Payer: Medicare Other | Admitting: Family

## 2023-03-10 VITALS — BP 138/78 | HR 92 | Temp 97.8°F | Ht 64.0 in | Wt 151.8 lb

## 2023-03-10 DIAGNOSIS — J449 Chronic obstructive pulmonary disease, unspecified: Secondary | ICD-10-CM

## 2023-03-10 DIAGNOSIS — B370 Candidal stomatitis: Secondary | ICD-10-CM

## 2023-03-10 DIAGNOSIS — N939 Abnormal uterine and vaginal bleeding, unspecified: Secondary | ICD-10-CM | POA: Diagnosis not present

## 2023-03-10 DIAGNOSIS — R7309 Other abnormal glucose: Secondary | ICD-10-CM | POA: Diagnosis not present

## 2023-03-10 DIAGNOSIS — H029 Unspecified disorder of eyelid: Secondary | ICD-10-CM | POA: Diagnosis not present

## 2023-03-10 DIAGNOSIS — L659 Nonscarring hair loss, unspecified: Secondary | ICD-10-CM | POA: Insufficient documentation

## 2023-03-10 DIAGNOSIS — R600 Localized edema: Secondary | ICD-10-CM | POA: Diagnosis not present

## 2023-03-10 LAB — URINALYSIS, ROUTINE W REFLEX MICROSCOPIC
Bilirubin Urine: NEGATIVE
Hgb urine dipstick: NEGATIVE
Ketones, ur: NEGATIVE
Leukocytes,Ua: NEGATIVE
Nitrite: NEGATIVE
Specific Gravity, Urine: 1.015 (ref 1.000–1.030)
Total Protein, Urine: 30 — AB
Urine Glucose: NEGATIVE
Urobilinogen, UA: 1 (ref 0.0–1.0)
pH: 7 (ref 5.0–8.0)

## 2023-03-10 LAB — COMPREHENSIVE METABOLIC PANEL
ALT: 40 U/L — ABNORMAL HIGH (ref 0–35)
AST: 20 U/L (ref 0–37)
Albumin: 4 g/dL (ref 3.5–5.2)
Alkaline Phosphatase: 74 U/L (ref 39–117)
BUN: 21 mg/dL (ref 6–23)
CO2: 29 meq/L (ref 19–32)
Calcium: 8.9 mg/dL (ref 8.4–10.5)
Chloride: 105 meq/L (ref 96–112)
Creatinine, Ser: 1.23 mg/dL — ABNORMAL HIGH (ref 0.40–1.20)
GFR: 43.21 mL/min — ABNORMAL LOW (ref >60.00)
Glucose, Bld: 75 mg/dL (ref 70–99)
Potassium: 4.5 meq/L (ref 3.5–5.1)
Sodium: 141 meq/L (ref 135–145)
Total Bilirubin: 0.9 mg/dL (ref 0.2–1.2)
Total Protein: 6.1 g/dL (ref 6.0–8.3)

## 2023-03-10 LAB — CBC WITH DIFFERENTIAL/PLATELET
Basophils Absolute: 0 10*3/uL (ref 0.0–0.1)
Basophils Relative: 0.3 % (ref 0.0–3.0)
Eosinophils Absolute: 0.1 10*3/uL (ref 0.0–0.7)
Eosinophils Relative: 0.8 % (ref 0.0–5.0)
HCT: 43 % (ref 36.0–46.0)
Hemoglobin: 14.3 g/dL (ref 12.0–15.0)
Lymphocytes Relative: 11.4 % — ABNORMAL LOW (ref 12.0–46.0)
Lymphs Abs: 1.6 10*3/uL (ref 0.7–4.0)
MCHC: 33.2 g/dL (ref 30.0–36.0)
MCV: 92.8 fL (ref 78.0–100.0)
Monocytes Absolute: 1.2 10*3/uL — ABNORMAL HIGH (ref 0.1–1.0)
Monocytes Relative: 8.2 % (ref 3.0–12.0)
Neutro Abs: 11.5 10*3/uL — ABNORMAL HIGH (ref 1.4–7.7)
Neutrophils Relative %: 79.3 % — ABNORMAL HIGH (ref 43.0–77.0)
Platelets: 196 10*3/uL (ref 150.0–400.0)
RBC: 4.63 Mil/uL (ref 3.87–5.11)
RDW: 17.1 % — ABNORMAL HIGH (ref 11.5–15.5)
WBC: 14.5 10*3/uL — ABNORMAL HIGH (ref 4.0–10.5)

## 2023-03-10 LAB — POCT URINALYSIS DIPSTICK
Blood, UA: NEGATIVE
Glucose, UA: NEGATIVE
Ketones, UA: NEGATIVE
Leukocytes, UA: NEGATIVE
Nitrite, UA: NEGATIVE
Protein, UA: POSITIVE — AB
Spec Grav, UA: 1.02 (ref 1.010–1.025)
Urobilinogen, UA: 1 U/dL
pH, UA: 7 (ref 5.0–8.0)

## 2023-03-10 LAB — B12 AND FOLATE PANEL
Folate: 23.4 ng/mL (ref >5.9)
Vitamin B-12: 512 pg/mL (ref 211–911)

## 2023-03-10 LAB — TSH: TSH: 3.7 u[IU]/mL (ref 0.35–5.50)

## 2023-03-10 MED ORDER — STIOLTO RESPIMAT 2.5-2.5 MCG/ACT IN AERS: 2.0000 | INHALATION_SPRAY | Freq: Every day | RESPIRATORY_TRACT | 3 refills | Status: DC

## 2023-03-10 MED ORDER — STIOLTO RESPIMAT 2.5-2.5 MCG/ACT IN AERS
2.0000 | INHALATION_SPRAY | Freq: Every day | RESPIRATORY_TRACT | 3 refills | Status: DC
Start: 2023-03-10 — End: 2023-03-10

## 2023-03-10 MED ORDER — CLOTRIMAZOLE 10 MG MT TROC
10.0000 mg | Freq: Three times a day (TID) | OROMUCOSAL | 0 refills | Status: AC
Start: 2023-03-10 — End: 2023-03-15

## 2023-03-10 NOTE — Assessment & Plan Note (Signed)
No recurrence.  Pending urinalysis.  Strict return precautions given to patient that she would need pelvic exam if this were to recur

## 2023-03-10 NOTE — Assessment & Plan Note (Signed)
Reassuring eye exam.  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  historically she has followed with [facility].  She politely declines follow-up with them.  Advise she may try topical tea tree oil.  If no resolution I have re encouraged her to call to schedule appointment .

## 2023-03-10 NOTE — Patient Instructions (Addendum)
I provided you with a refill of dissolving troches for thrush if you were to need in the future.   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  I ordered an ultrasound of your legs to be done [facility].  We will contact you to schedule this.  Please wear compression stockings during the day and follow a low-sodium diet.  We are ordering urine studies today to ensure no evidence of blood.  If you have recurrence of vaginal bleeding, please call the office to schedule a pelvic exam right away   Nice to see you!

## 2023-03-10 NOTE — Assessment & Plan Note (Signed)
Trace bilateral none pitting edema on exam.  Longstanding history of smoking.  Concern for PAD.  ABI ordered to be done [facility]. I have notified Sheppard Plumber, NP.  Encouraged compression stockings, low-sodium diet .

## 2023-03-10 NOTE — Assessment & Plan Note (Signed)
Diffuse.  Recommended over-the-counter Rogaine for women and hair skin and nail supplement.  Pending labs

## 2023-03-10 NOTE — Progress Notes (Signed)
Assessment & Plan:  Hair thinning Assessment & Plan: Diffuse.  Recommended over-the-counter Rogaine for women and hair skin and nail supplement.  Pending labs  Orders: -     Comprehensive metabolic panel -     CBC with Differential/Platelet -     TSH -     Iron, TIBC and Ferritin Panel -     B12 and Folate Panel  Thrush -     Clotrimazole; Take 1 tablet (10 mg total) by mouth 3 (three) times daily for 5 days. Dissolve TID  Dispense: 15 tablet; Refill: 0  Vaginal bleeding Assessment & Plan: No recurrence.  Pending urinalysis.  Strict return precautions given to patient that she would need pelvic exam if this were to recur  Orders: -     Urine Culture -     Urinalysis, Routine w reflex microscopic  Pedal edema Assessment & Plan: Trace bilateral none pitting edema on exam.  Longstanding history of smoking.  Concern for PAD.  ABI ordered to be done  endovascular. I have notified Sheppard Plumber, NP.  Encouraged compression stockings, low-sodium diet .  Orders: -     VAS [place] ABI WITH/WO TBI; Future  Stage 2 moderate COPD by GOLD classification (HCC) -     Stiolto Respimat; Inhale 2 puffs into the lungs daily.  Dispense: 4 g; Refill: 3  Elevated glucose -     POCT urinalysis dipstick  Eyelid abnormality Assessment & Plan: Reassuring eye exam.  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  historically she has followed with [facility].  She politely declines follow-up with them.  Advise she may try topical tea tree oil.  If no resolution I have re encouraged her to call to schedule appointment .       Return precautions given.   Risks, benefits, and alternatives of the medications and treatment plan prescribed today were discussed, and patient expressed understanding.   Education regarding symptom management and diagnosis given to patient on AVS either electronically or printed.  No follow-ups on file.  Rennie Plowman,  FNP  Subjective:    Patient ID: Tracy Bell, female    DOB: March 13, 1951, 72 y.o.   MRN: [id number]  CC: Tracy Bell is a 72 y.o. female who presents today for follow up.   HPI: She has several complaints today.  Diffuse Hair thinning and diffuse hair loss over the years.   Nails are thin for years.  Nails are not discolored or yellow.  She can feel the ridge.     Thrush improving on omeprazole troche. White patches on tongue have improved. She is rinsing her mouth out of Stiolto.   No increased cough .   Denies CP, sob, fever.   She reports one isolated episode of scant vaginal bleeding 5 months ago, no recurrence. Denies hematuria.   She complains bilateral feet swelling. She sits most of the day.  Improves with compression stockings and elevation.   Denies changes to baseline shortness of breath, cough.  Denies orthopnea, calf pain when walking, weight gain.   She noticed in the morning, she will wipe her eyes and 'sand' will fall off. She sees 'tiny dots on my eyelashes'.   No vision loss, purulent eye discharge. This has been going on for years. She is concerned with mites.   Burping has resolved.    Allergies: Patient has no known allergies. Current Outpatient Medications on File Prior to Visit  Medication Sig Dispense Refill  apixaban (ELIQUIS) 5 MG TABS tablet Take 1 tablet (5 mg total) by mouth 2 (two) times daily. 180 tablet 4   atorvastatin (LIPITOR) 80 MG tablet TAKE 1 TABLET BY MOUTH EVERY DAY 90 tablet 2   cholecalciferol (VITAMIN D3) 25 MCG (1000 UT) tablet Take 1,000 Units by mouth daily.     diltiazem (CARDIZEM CD) 360 MG 24 hr capsule Take 1 capsule (360 mg total) by mouth daily. 90 capsule 3   guaiFENesin (MUCINEX) 600 MG 12 hr tablet Take by mouth daily.     losartan (COZAAR) 50 MG tablet TAKE 1 TABLET BY MOUTH EVERY DAY 90 tablet 3   Multiple Vitamins-Minerals (CENTRUM ADULTS) TABS Take 1 tablet by mouth daily. 30 tablet 6   furosemide (LASIX)  20 MG tablet Take 1 tablet (20 mg total) by mouth daily as needed for edema (Take as needed for swelling or weight gain greater than 3 pounds overnight). TAKE Furosemide 20 mg once daily for 3 days THEN as needed 90 tablet 3   No current facility-administered medications on file prior to visit.    Review of Systems  Constitutional:  Negative for chills and fever.  HENT:  Negative for ear discharge.   Eyes:  Positive for discharge. Negative for pain, redness, itching and visual disturbance.  Respiratory:  Negative for cough and shortness of breath.   Cardiovascular:  Positive for leg swelling. Negative for chest pain and palpitations.  Gastrointestinal:  Negative for nausea and vomiting.      Objective:    BP 138/78   Pulse 92   Temp 97.8 F (36.6 C) (Oral)   Ht 5\' 4"  (1.626 m)   Wt 151 lb 12.8 oz (68.9 kg)   SpO2 96%   BMI 26.06 kg/m  BP Readings from Last 3 Encounters:  03/10/23 138/78  10/11/22 (!) 142/98  10/06/22 (!) 133/94   Wt Readings from Last 3 Encounters:  03/10/23 151 lb 12.8 oz (68.9 kg)  10/11/22 157 lb 6.4 oz (71.4 kg)  10/06/22 159 lb 6.4 oz (72.3 kg)    Physical Exam Vitals reviewed.  Constitutional:      Appearance: She is well-developed.  Eyes:     General: Lids are normal. Vision grossly intact.        Right eye: No foreign body.        Left eye: No foreign body.     Conjunctiva/sclera: Conjunctivae normal.     Comments: No purulent discharge or flakes seen in eyelashes  Cardiovascular:     Rate and Rhythm: Normal rate and regular rhythm.     Pulses: Normal pulses.     Heart sounds: Normal heart sounds.     Comments: Trace bilateral pedal edema, non pitting  No palpable cords or masses. No erythema or increased warmth. No asymmetry in calf size when compared bilaterally LE hair growth symmetric and present. No discoloration or varicosities noted. Diminished bilateral pedal pulses bilaterally  Pulmonary:     Effort: Pulmonary effort is  normal.     Breath sounds: Normal breath sounds. No wheezing, rhonchi or rales.  Skin:    General: Skin is warm and dry.  Neurological:     Mental Status: She is alert.  Psychiatric:        Speech: Speech normal.        Behavior: Behavior normal.        Thought Content: Thought content normal.

## 2023-03-12 LAB — URINE CULTURE
MICRO NUMBER:: 15706101
Result:: NO GROWTH
SPECIMEN QUALITY:: ADEQUATE

## 2023-03-12 LAB — IRON,TIBC AND FERRITIN PANEL

## 2023-03-13 ENCOUNTER — Other Ambulatory Visit: Payer: Self-pay | Admitting: Family

## 2023-03-13 ENCOUNTER — Telehealth: Payer: Self-pay | Admitting: Family

## 2023-03-13 ENCOUNTER — Telehealth: Payer: Self-pay

## 2023-03-13 DIAGNOSIS — R899 Unspecified abnormal finding in specimens from other organs, systems and tissues: Secondary | ICD-10-CM

## 2023-03-13 DIAGNOSIS — L659 Nonscarring hair loss, unspecified: Secondary | ICD-10-CM

## 2023-03-13 DIAGNOSIS — N189 Chronic kidney disease, unspecified: Secondary | ICD-10-CM

## 2023-03-13 NOTE — Telephone Encounter (Signed)
Was unable to lvm will try back again

## 2023-03-13 NOTE — Telephone Encounter (Signed)
Tracy Bell and Tracy Bell,  Patient's iron panel with ferritin didn't result- can it be added on?

## 2023-03-13 NOTE — Telephone Encounter (Signed)
Called pt and was unable to lvm. Pt needs to schedule labs  Seen by patient Tracy Bell on 03/13/2023 11:08 AM

## 2023-03-14 DIAGNOSIS — M25511 Pain in right shoulder: Secondary | ICD-10-CM | POA: Diagnosis not present

## 2023-03-14 DIAGNOSIS — M7531 Calcific tendinitis of right shoulder: Secondary | ICD-10-CM | POA: Diagnosis not present

## 2023-03-15 ENCOUNTER — Ambulatory Visit: Payer: Medicare Other | Admitting: Family

## 2023-03-15 ENCOUNTER — Telehealth: Payer: Self-pay

## 2023-03-15 NOTE — Telephone Encounter (Signed)
-----   Message from Rennie Plowman sent at 03/13/2023  9:25 AM EST ----- Call patient Unfortunately the iron panel was not obtained with her labs last week.  I am so very sorry about this and are not sure why.  Please ask her to come back in 2 weeks for a nonfasting lab and we will obtain the iron panel as well as labs below.   White blood cells and neutrophils  elevated at time of lab draw.  I suspect this may be transient ( virus and allergies can affect)  . Please ensure she is not having any fevers or has concern for acute infection.  We will recheck her white blood cells when she comes back for iron panel as well No blood in urine or evidence of urinary tract infection.  Renal function slightly lower than prior. There is also protein in her urine which can be indicative of underlying renal disease.   most important to avoid all over the counter antiinflammatories ( such as Aleve, Motrin, ibuprofen etc) as they can worsen kidney function. Our goal is to preserve your kidney function going forward. If we see any more of a decrease, I will place a referral to nephrology.

## 2023-03-15 NOTE — Telephone Encounter (Signed)
Tried calling pt to schedule lab appt. Phone was disconnected will try again later

## 2023-03-17 ENCOUNTER — Ambulatory Visit (INDEPENDENT_AMBULATORY_CARE_PROVIDER_SITE_OTHER): Payer: Medicare Other

## 2023-03-17 ENCOUNTER — Other Ambulatory Visit: Payer: Self-pay | Admitting: Family

## 2023-03-17 ENCOUNTER — Encounter: Payer: Self-pay | Admitting: Family

## 2023-03-17 DIAGNOSIS — J449 Chronic obstructive pulmonary disease, unspecified: Secondary | ICD-10-CM

## 2023-03-17 DIAGNOSIS — I4811 Longstanding persistent atrial fibrillation: Secondary | ICD-10-CM

## 2023-03-17 DIAGNOSIS — R600 Localized edema: Secondary | ICD-10-CM | POA: Diagnosis not present

## 2023-03-17 DIAGNOSIS — B370 Candidal stomatitis: Secondary | ICD-10-CM

## 2023-03-17 MED ORDER — FLUCONAZOLE 150 MG PO TABS: 150.0000 mg | ORAL_TABLET | Freq: Once | ORAL | 0 refills | Status: AC

## 2023-03-17 NOTE — Telephone Encounter (Signed)
Pt has appt scheduled for 03/24/23

## 2023-03-24 ENCOUNTER — Telehealth: Payer: Self-pay

## 2023-03-24 ENCOUNTER — Telehealth: Payer: Self-pay | Admitting: Family

## 2023-03-24 ENCOUNTER — Other Ambulatory Visit: Payer: Medicare Other

## 2023-03-24 DIAGNOSIS — R899 Unspecified abnormal finding in specimens from other organs, systems and tissues: Secondary | ICD-10-CM | POA: Diagnosis not present

## 2023-03-24 DIAGNOSIS — N189 Chronic kidney disease, unspecified: Secondary | ICD-10-CM

## 2023-03-24 DIAGNOSIS — H0100A Unspecified blepharitis right eye, upper and lower eyelids: Secondary | ICD-10-CM | POA: Diagnosis not present

## 2023-03-24 LAB — MICROALBUMIN / CREATININE URINE RATIO
Creatinine,U: 104.6 mg/dL
Microalb Creat Ratio: 6.1 mg/g (ref 0.0–30.0)
Microalb, Ur: 6.4 mg/dL — ABNORMAL HIGH (ref 0.0–1.9)

## 2023-03-24 NOTE — Progress Notes (Signed)
   Care Guide Note  03/24/2023 Name: [name] MRN: [id number] DOB: 03/12/1951  Referred by: Allegra Grana, FNP Reason for referral : Care Coordination (Outreach to schedule with Pharm d )   Tracy Bell is a 72 y.o. year old female who is a primary care patient of Allegra Grana, FNP. Astin M Eckerson was referred to the pharmacist for assistance related to Atrial Fibrillation.    Successful contact was made with the patient to discuss pharmacy services including being ready for the pharmacist to call at least 5 minutes before the scheduled appointment time, to have medication bottles and any blood sugar or blood pressure readings ready for review. The patient agreed to meet with the pharmacist via with the pharmacist via telephone visit on (date/time).  03/28/2023  Penne Lash , RMA     [facility] Guide  Direct Dial: [phone]  Website: [email or web address]

## 2023-03-24 NOTE — Telephone Encounter (Signed)
Patient saw Jason Coop recently. Patient was seen for thrush. Fluconazole was prescribe, to take by mouth. She was given the vagina one.

## 2023-03-25 ENCOUNTER — Encounter: Payer: Self-pay | Admitting: Family

## 2023-03-27 ENCOUNTER — Telehealth: Payer: Self-pay | Admitting: Family

## 2023-03-27 ENCOUNTER — Other Ambulatory Visit: Payer: Self-pay | Admitting: Family

## 2023-03-27 DIAGNOSIS — B370 Candidal stomatitis: Secondary | ICD-10-CM

## 2023-03-27 MED ORDER — FLUCONAZOLE 100 MG PO TABS: 100.0000 mg | ORAL_TABLET | Freq: Every day | ORAL | 1 refills | Status: AC

## 2023-03-27 NOTE — Telephone Encounter (Signed)
Tracy [name] and Tracy [name] was here last week and only one of the urine studies was collected and blood serum not collected  Is the urine still available to send out for protein electrophoresis, multiple myeloma panel? Is the cbc bmp and ferritin lab not visible? I have ordered all on 03/13/23.   Jenate, please call pt and apologize as blood serum was not drawn.    She will need to come back for CBC, BMP and iron.  She may have to leave more urine.   I am not sure why this keeps happening however [name] was scheduled for labs and this is the second time she would have come in to have labs drawn

## 2023-03-27 NOTE — Telephone Encounter (Signed)
Spoke to pt and scheduled her for Wednesday  to get urine , and cbc,cmp, iron and labwork done

## 2023-03-28 ENCOUNTER — Other Ambulatory Visit: Payer: Medicare Other | Admitting: Pharmacist

## 2023-03-28 DIAGNOSIS — J449 Chronic obstructive pulmonary disease, unspecified: Secondary | ICD-10-CM

## 2023-03-28 NOTE — Progress Notes (Signed)
   03/28/2023 Name: Tracy Bell MRN: [id number] DOB: 07-31-1950  Subjective  Chief Complaint  Patient presents with   Medication Assistance   Reason for visit:   Care Team: Primary Care Provider: Allegra Grana, FNP  Reason for visit: ?  Tracy Bell is a 72 y.o. female who presents today for a Tracy Bell is a 72 y.o. year old female who presented for a telephone visit.   They were referred to the pharmacist by their PCP for assistance in managing medication access.  with the pharmacist due to medication access concerns regarding their eliquis and stoiloto. ?   Medication Access: ?  Prescription drug coverage: Payor: [employer] MEDICARE / Plan: [facility] MEDICARE / Product Type: *No Product type* / .   Current Patient Assistance: [employer] ([employer] Cares) - Stiolto  Patient lives in a household of 1 with an annual income of   Medicare LIS Eligible: No  Individual Income Limit $1843/month - Exceeds  Assessment and Plan:   1. Medication Access COPD:  Stiolto is offered through [facility] MAP program, 200% [employer]. Re-enrollment application completed during visit.  Provider page placed on PCP desk for signature. Plan to fax to [facility] by end of day   Afib: Eliquis is available through [facility] MAP though only after OOP pharmacy expenditure of >3% household income. 3% of estimated income would be $807 on prescription medication costs in 2024 which patient has not met.  Reviewed with patient the elimination of the donut hole for all Medicare plans in 2025,  resolving this issue for the next calendar year.  Cc'ing Med Advocate Team for future tracking/communications. No further action needed at this time.   Future Appointments  Date Time Provider Department Center  03/29/2023 11:15 AM [facility]  04/04/2023  8:50 AM Sondra Barges, PA-C [facility] None  05/08/2023  9:00 AM Salena Saner, MD [facility] None  10/06/2023 10:30 AM [facility] 2 [facility]  None  10/06/2023 11:15 AM [facility] 2 [facility] None  10/06/2023 11:45 AM Georgiana Spinner, NP [facility] None    Loree Fee, PharmD Clinical Pharmacist [facility] [phone]

## 2023-03-29 ENCOUNTER — Other Ambulatory Visit (INDEPENDENT_AMBULATORY_CARE_PROVIDER_SITE_OTHER): Payer: Medicare Other

## 2023-03-29 ENCOUNTER — Other Ambulatory Visit: Payer: Self-pay | Admitting: Family

## 2023-03-29 DIAGNOSIS — R899 Unspecified abnormal finding in specimens from other organs, systems and tissues: Secondary | ICD-10-CM | POA: Diagnosis not present

## 2023-03-29 DIAGNOSIS — N189 Chronic kidney disease, unspecified: Secondary | ICD-10-CM

## 2023-03-29 DIAGNOSIS — L659 Nonscarring hair loss, unspecified: Secondary | ICD-10-CM | POA: Diagnosis not present

## 2023-03-29 LAB — CBC WITH DIFFERENTIAL/PLATELET
Basophils Absolute: 0.1 10*3/uL (ref 0.0–0.1)
Basophils Relative: 0.6 % (ref 0.0–3.0)
Eosinophils Absolute: 0 10*3/uL (ref 0.0–0.7)
Eosinophils Relative: 0.4 % (ref 0.0–5.0)
HCT: 44.6 % (ref 36.0–46.0)
Hemoglobin: 14.6 g/dL (ref 12.0–15.0)
Lymphocytes Relative: 17.9 % (ref 12.0–46.0)
Lymphs Abs: 1.7 10*3/uL (ref 0.7–4.0)
MCHC: 32.7 g/dL (ref 30.0–36.0)
MCV: 96.2 fL (ref 78.0–100.0)
Monocytes Absolute: 0.8 10*3/uL (ref 0.1–1.0)
Monocytes Relative: 8.1 % (ref 3.0–12.0)
Neutro Abs: 7.1 10*3/uL (ref 1.4–7.7)
Neutrophils Relative %: 73 % (ref 43.0–77.0)
Platelets: 220 10*3/uL (ref 150.0–400.0)
RBC: 4.63 Mil/uL (ref 3.87–5.11)
RDW: 19.8 % — ABNORMAL HIGH (ref 11.5–15.5)
WBC: 9.7 10*3/uL (ref 4.0–10.5)

## 2023-03-29 LAB — BASIC METABOLIC PANEL
BUN: 14 mg/dL (ref 6–23)
CO2: 30 meq/L (ref 19–32)
Calcium: 9.3 mg/dL (ref 8.4–10.5)
Chloride: 103 meq/L (ref 96–112)
Creatinine, Ser: 1.2 mg/dL (ref 0.40–1.20)
GFR: 44.49 mL/min — ABNORMAL LOW (ref >60.00)
Glucose, Bld: 109 mg/dL — ABNORMAL HIGH (ref 70–99)
Potassium: 3.7 meq/L (ref 3.5–5.1)
Sodium: 140 meq/L (ref 135–145)

## 2023-04-03 ENCOUNTER — Encounter: Payer: Self-pay | Admitting: Family

## 2023-04-03 LAB — IRON,TIBC AND FERRITIN PANEL
%SAT: 27 % (ref 16–45)
Ferritin: 30 ng/mL (ref 16–288)
Iron: 96 ug/dL (ref 45–160)
TIBC: 360 ug/dL (ref 250–450)

## 2023-04-03 LAB — PROTEIN ELECTROPHORESIS, SERUM
Albumin ELP: 3.8 g/dL (ref 3.8–4.8)
Alpha 1: 0.4 g/dL — ABNORMAL HIGH (ref 0.2–0.3)
Alpha 2: 0.7 g/dL (ref 0.5–0.9)
Beta 2: 0.3 g/dL (ref 0.2–0.5)
Beta Globulin: 0.4 g/dL (ref 0.4–0.6)
Gamma Globulin: 0.5 g/dL — ABNORMAL LOW (ref 0.8–1.7)
Total Protein: 6.1 g/dL (ref 6.1–8.1)

## 2023-04-04 ENCOUNTER — Ambulatory Visit: Payer: Medicare Other | Admitting: Physician Assistant

## 2023-04-05 LAB — IFE AND PE, RANDOM URINE
% BETA, Urine: 23.6 %
ALBUMIN, U: 39.9 %
ALPHA 1 URINE: 6.4 %
ALPHA-2-GLOBULIN, U: 19.1 %
GAMMA GLOBULIN URINE: 11.1 %

## 2023-04-05 NOTE — Progress Notes (Signed)
Cardiology Office Note    Date:  04/07/2023   ID:  Khalee, Dewindt 1950-10-24, MRN [id number]  PCP:  Allegra Grana, FNP  Cardiologist:  Lorine Bears, MD  Electrophysiologist:  None   Chief Complaint: Follow-up  History of Present Illness:   Tracy Bell is a 73 y.o. female with history of coronary artery calcification noted on CT imaging, persistent A-fib, atrial flutter diagnosed in 12/2019, SVT, second-degree AV block type I, mild to moderate aortic stenosis, ectatic aorta, carotid artery disease status post right-sided CEA in 07/2018, COPD, HTN, HLD, pulmonary nodules, and ongoing tobacco use who presents for follow-up of Afib.   She was previously followed by Dr. Darrold Junker with CT of the lungs showing evidence of coronary calcification.  Stress echo in 12/2015 showed normal LV systolic function with mild aortic stenosis and no evidence of ischemia with stress.  She had palpitations in 2019 with a 72-hour monitor showing sinus rhythm with frequent PACs and occasional brief atrial runs lasting up to 7 beats.  Echo from 04/2018 showed normal LV systolic function with mild to moderate aortic stenosis with a mean gradient of 13 mmHg and a valve area of 1.16 cm.   She transitioned her care to Dr. Kirke Corin in 05/2019 for evaluation of tachypalpitations.  Zio patch at that time showed sinus rhythm with an average rate of 65 bpm, sinus bradycardia during the early morning hours with the lowest heart rate of 43 bpm, frequent episodes of SVT totaling 100 runs with the longest episode lasting 15 minutes and 54 seconds, and intermittent Mobitz type I second-degree AV block in the early morning hours.  Echo in 2021 showed an EF of 60 to 65%, borderline LVH, grade 2 diastolic dysfunction, no regional wall motion abnormalities, normal RV systolic function, mild tricuspid regurgitation, mild to moderate aortic stenosis with a mean gradient of 14 mmHg and a valve area of 1.4 cm.  She was diagnosed  with atrial flutter in 12/2019 and initiated on apixaban.  She was noted to have converted to sinus rhythm with titration of diltiazem in follow-up in 01/2020.    She was evaluated by her PCP on 05/04/2022 for dizziness.  She was found to be in rate controlled A-fib at that time.  Given this finding, she followed up in our office on 05/05/2022 with continued dizziness that she indicated dated back to mid December 2023.  She reported sometimes forgetting to take her second dose of apixaban.  Given missed doses of apixaban, MRI of the brain was ordered and remains pending at this time.  To quantify A-fib burden, she underwent Zio patch which showed a 100% A-fib burden with an average ventricular rate of 91 bpm with a range of 42 to 189 bpm.  Echo from 05/2022 showed an EF of 60 to 65%, no regional wall motion abnormalities, mild LVH, normal RV systolic function and ventricular cavity size, mildly elevated PASP estimated at 37.7 mmHg, moderately dilated left atrium, mild mitral regurgitation, moderate aortic valve stenosis with a mean gradient of 18 mmHg and a valve area of 1.2 cm, and an estimated right atrial pressure of 3 mmHg.  She was seen in the office in 06/2022 noting some improvement in her dizziness.  She remained in rate controlled A-fib.  She did continue to miss evening doses of apixaban and was not interested in transitioning to rivaroxaban.  She was seen in the office on 07/26/2022 and had been more compliant with her apixaban, though still  reported missing one dose every 1 to 2 weeks.  She was felt to be volume up with a weight that was up 5 pounds when compared to prior visit.  BNP 498 at that time.  She was advised to take Lasix 20 mg daily for 3 days followed by as needed thereafter.  She was last seen in the office in 08/2022 and was without symptoms of angina or cardiac decompensation.  Her weight was down 5 pounds when compared to prior visit.  She did note some ankle swelling at times and spent a fair  amount of time sitting at a table working on art and playing on the computer.  She denied missing any doses of apixaban, though did not want to pursue rhythm control strategy.  She preferred to not transition off diltiazem.  She continued to smoke and was not interested in quitting.  With regards to her vascular disease, carotid artery ultrasound from 10/2022 showed 1 to 39% bilateral ICA stenoses with bilateral antegrade flow of the vertebral arteries and normal flow hemodynamics in the bilateral subclavian arteries.  Most recent AAA ultrasound from 10/2022 showed mild dilatation of the distal aorta proximal aorta measuring up to 3.19 cm and not aneurysmal.  She comes in doing well from a cardiac perspective and is without symptoms of angina or cardiac decompensation.  Dizziness is now only noticeable if she rotates her head to the left.  No presyncope or syncope.  No palpitations.  No falls, hematochezia, or melena.  She does continue to miss her evening dose of apixaban approximately 1-2 times per month.  She does not want to transition to alternative anticoagulation.  She also notes some mild stable bilateral ankle swelling with her feet hanging down for the majority of the day.  Not currently wearing compression socks.  Blood pressure at home is typically in the 140s to 150s over 90s to 100s.  Continues to smoke and is not interested in quitting.  Her weight is down 20 pounds today when compared to her visit in 08/2022.  She indicates this is in the setting of diminished oral intake following an episode of thrush that is now resolved.  Appetite has improved.   Labs independently reviewed: 03/2023 - potassium 3.7, BUN 14, serum creatinine 1.2, Hgb 14.6, PLT 220, TSH normal, albumin 0.0, AST normal, ALT 40 06/2021 - TC 137, TG 92, HDL 44, LDL 74  08/2020 - A1c 5.8   Past Medical History:  Diagnosis Date   Anxiety    COPD (chronic obstructive pulmonary disease) (HCC)    Cough    Depression     Dizziness    Dyspnea    Heart murmur    Hyperlipidemia    Hypertension    Lightheadedness    Personal history of tobacco use, presenting hazards to health 08/28/2015    Past Surgical History:  Procedure Laterality Date   ABDOMINAL HYSTERECTOMY  1980   complete   BREAST BIOPSY  1970   normal   DILATION AND CURETTAGE OF UTERUS     ENDARTERECTOMY Right 07/12/2018   Procedure: ENDARTERECTOMY CAROTID;  Surgeon: Annice Needy, MD;  Location: [facility] ORS;  Service: Vascular;  Laterality: Right;   TUBAL LIGATION      Current Medications: Current Meds  Medication Sig   apixaban (ELIQUIS) 5 MG TABS tablet Take 1 tablet (5 mg total) by mouth 2 (two) times daily.   atorvastatin (LIPITOR) 80 MG tablet TAKE 1 TABLET BY MOUTH EVERY DAY   Biotin  5000 MCG CAPS Take by mouth.   cholecalciferol (VITAMIN D3) 25 MCG (1000 UT) tablet Take 1,000 Units by mouth daily.   diltiazem (CARDIZEM CD) 360 MG 24 hr capsule Take 1 capsule (360 mg total) by mouth daily.   furosemide (LASIX) 20 MG tablet Take 1 tablet (20 mg total) by mouth daily as needed for edema (Take as needed for swelling or weight gain greater than 3 pounds overnight). TAKE Furosemide 20 mg once daily for 3 days THEN as needed   Multiple Vitamins-Minerals (CENTRUM ADULTS) TABS Take 1 tablet by mouth daily.   Tiotropium Bromide-Olodaterol (STIOLTO RESPIMAT) 2.5-2.5 MCG/ACT AERS Inhale 2 puffs into the lungs daily.   [DISCONTINUED] losartan (COZAAR) 50 MG tablet TAKE 1 TABLET BY MOUTH EVERY DAY    Allergies:   Patient has no known allergies.   Social History   Socioeconomic History   Marital status: Widowed    Spouse name: Not on file   Number of children: Not on file   Years of education: Not on file   Highest education level: Not on file  Occupational History   Occupation: retired  Tobacco Use   Smoking status: Every Day    Current packs/day: 2.00    Average packs/day: 2.0 packs/day for 60.1 years (120.3 ttl pk-yrs)    Types:  Cigarettes    Start date: 05/03/1963   Smokeless tobacco: Never   Tobacco comments:    1 PPD: 02/20/2022  Vaping Use   Vaping status: Never Used  Substance and Sexual Activity   Alcohol use: Yes    Alcohol/week: 0.0 standard drinks of alcohol    Comment: [place]um[place] occasionally   Drug use: No   Sexual activity: Not on file  Other Topics Concern   Not on file  Social History Narrative   Lives with husband. Has 11 pets in home. Has one son, lives locally.   Was born and raised in [place].   Husband is older than her and she provides care for him.      Work - retired. Invented water bottle for pets   Social Determinants of Health   Financial Resource Strain: Medium Risk (04/13/2021)   Overall Financial Resource Strain (CARDIA)    Difficulty of Paying Living Expenses: Somewhat hard  Food Insecurity: Not on file  Transportation Needs: Not on file  Physical Activity: Not on file  Stress: Not on file  Social Connections: Not on file     Family History:  The patient's family history includes Cancer in her father and mother; Cancer (age of onset: 57) in her sister; Lung cancer in her sister and sister.  ROS:   12-point review of systems is negative unless otherwise noted in the HPI.   EKGs/Labs/Other Studies Reviewed:    Studies reviewed were summarized above. The additional studies were reviewed today:  Zio patch 05/2022: Atrial Fibrillation occurred continuously (100% burden), ranging from 42-189 bpm (avg of 91 bpm).  Rare PVCs. __________   2D echo 05/13/2022: 1. Left ventricular ejection fraction, by estimation, is 60 to 65%. The  left ventricle has normal function. The left ventricle has no regional  wall motion abnormalities. There is mild left ventricular hypertrophy.  Left ventricular diastolic parameters  are indeterminate.   2. Right ventricular systolic function is normal. The right ventricular  size is normal. There is mildly elevated pulmonary artery  systolic  pressure. The estimated right ventricular systolic pressure is 37.7 mmHg.   3. Left atrial size was moderately dilated.  4. The mitral valve is normal in structure. Mild mitral valve  regurgitation. No evidence of mitral stenosis.   5. The aortic valve is normal in structure. Aortic valve regurgitation is  mild. Moderate aortic valve stenosis. Aortic valve area, by VTI measures  1.20 cm. Aortic valve mean gradient measures 18.0 mmHg. Aortic valve Vmax  measures 2.68 m/s.   6. The inferior vena cava is normal in size with greater than 50%  respiratory variability, suggesting right atrial pressure of 3 mmHg.   Comparison(s): Previous Echo showed LV EF 60-65%, normal LV function, mild  to moderate AS, mean gradient 14 mmHg.  __________   2D echo 06/05/2019: 1. Left ventricular ejection fraction, by visual estimation, is 60 to  65%. The left ventricle has normal function. There is borderline left  ventricular hypertrophy.   2. Left ventricular diastolic parameters are consistent with Grade II  diastolic dysfunction (pseudonormalization).   3. The left ventricle has no regional wall motion abnormalities.   4. Global right ventricle has normal systolic function.The right  ventricular size is normal. Right vetricular wall thickness was not  assessed.   5. Left atrial size was normal.   6. Right atrial size was normal.   7. The mitral valve is normal in structure. No evidence of mitral valve  regurgitation.   8. The tricuspid valve is normal in structure.   9. The tricuspid valve is normal in structure. Tricuspid valve  regurgitation is mild.  10. The aortic valve is abnormal. Aortic valve regurgitation is not  visualized. Mild to moderate aortic valve stenosis.  11. Peak AV gradient , MG , DVI 0.45, with LVOT diameter of  2cm, AVA 1.4cm2.  12. The pulmonic valve was not well visualized. Pulmonic valve  regurgitation is not visualized.  13. Mildly elevated  pulmonary artery systolic pressure.  14. The inferior vena cava is normal in size with greater than 50%  respiratory variability, suggesting right atrial pressure of 3 mmHg.  __________   Tracy Bell patch 05/2019: Normal sinus rhythm with an average heart rate of 65 bpm. Sinus bradycardia during early morning hours with lowest heart rate of 43 bpm. Frequent episodes of SVT with a total of 100 runs.  The longest run lasted 50 minutes and 54 seconds. Intermittent Mobitz 1 second-degree AV block and early morning hours. Most triggered events correlated with SVT.   EKG:  EKG is ordered today.  The EKG ordered today demonstrates Afib, 89 bpm, baseline artifact involving the anterior leads, nonspecific st/t changes consistent with prior tracing  Recent Labs: 07/26/2022: B Natriuretic Peptide 498.8 03/10/2023: ALT 40; TSH 3.70 03/29/2023: BUN 14; Creatinine, Ser 1.20; Hemoglobin 14.6; Platelets 220.0; Potassium 3.7; Sodium 140  Recent Lipid Panel    Component Value Date/Time   CHOL 137 07/12/2021 1400   TRIG 92.0 07/12/2021 1400   HDL 44.70 07/12/2021 1400   CHOLHDL 3 07/12/2021 1400   VLDL 18.4 07/12/2021 1400   LDLCALC 74 07/12/2021 1400    PHYSICAL EXAM:    VS:  BP (!) 142/100 (BP Location: Left Arm, Patient Position: Sitting, Cuff Size: Normal)   Pulse 92   Ht 5\' 6"  (1.676 m)   Wt 143 lb 3.2 oz (65 kg)   SpO2 96%   BMI 23.11 kg/m   BMI: Body mass index is 23.11 kg/m.  Physical Exam Vitals reviewed.  Constitutional:      Appearance: She is well-developed.  HENT:     Head: Normocephalic and atraumatic.  Eyes:  General:        Right eye: No discharge.        Left eye: No discharge.  Neck:     Vascular: No JVD.  Cardiovascular:     Rate and Rhythm: Normal rate. Rhythm irregularly irregular.     Heart sounds: S1 normal and S2 normal. Heart sounds not distant. No midsystolic click and no opening snap. Murmur heard.     Harsh midsystolic murmur is present with a grade of 2/6 at  the upper right sternal border radiating to the neck.     No friction rub.  Pulmonary:     Effort: Pulmonary effort is normal. No respiratory distress.     Breath sounds: Normal breath sounds. No decreased breath sounds, wheezing, rhonchi or rales.  Chest:     Chest wall: No tenderness.  Abdominal:     General: There is no distension.  Musculoskeletal:     Cervical back: Normal range of motion.     Comments: Trivial bilateral ankle edema.  Skin:    General: Skin is warm and dry.     Nails: There is no clubbing.  Neurological:     Mental Status: She is alert and oriented to person, place, and time.  Psychiatric:        Speech: Speech normal.        Behavior: Behavior normal.        Thought Content: Thought content normal.        Judgment: Judgment normal.     Wt Readings from Last 3 Encounters:  04/07/23 143 lb 3.2 oz (65 kg)  03/10/23 151 lb 12.8 oz (68.9 kg)  10/11/22 157 lb 6.4 oz (71.4 kg)     ASSESSMENT & PLAN:   Persistent A-fib/flutter: She remains in A-fib with controlled ventricular response.  Continue to suspect diltiazem is contributing to her mild bilateral ankle swelling, though she prefers to not change pharmacotherapy to beta-blocker.  Remains on Cardizem CD 360 mg daily.  CHA2DS2-VASc at least 4 (HTN, age x 1, vascular disease, sex category).  Remains on apixaban 5 mg twice daily and does not meet reduced dosing criteria.  She does continue to miss 1-2 doses of apixaban per month, declines transition to alternative anticoagulation.  Declines DCCV.  Pursue rate control at patient request.  HFpEF: Euvolemic and well compensated.  Remains on as needed furosemide.  Coronary artery calcification: No symptoms suggestive of angina or cardiac decompensation.  Has declined ischemic testing in the past.  Remains on apixaban denies of aspirin given underlying A-fib/flutter.  Continue atorvastatin and risk factor modification including recommendation for complete smoking  cessation.  Moderate aortic stenosis: Asymptomatic.  Schedule echo in 05/2023.  History of secondary AV block type I: Noted during nocturnal hours.  Has declined sleep study.  No indication for PPM at this time.  HTN: Blood pressure is elevated in the office today.  Titrate losartan to 100 mg daily with continuation of Cardizem CD 360 mg.  Consider addition of beta-blocker if blood pressure remains elevated.  HLD: LDL 74 in 03/2023.  Remains on atorvastatin 80 mg.  Carotid artery disease: Status post right-sided CEA. Most recent carotid artery ultrasound from 10/2022 showed bilateral 1 to 39% ICA stenoses.  She is on apixaban in place of aspirin and atorvastatin as outlined above.  Followed by vascular surgery.  Ectatic thoracic/abdominal aorta: Upper limits of normal ascending thoracic aorta on CT imaging in 07/2021.  Abdominal aorta is largely stable on imaging earlier this year.  Followed by vascular surgery with ongoing management and follow-up deferred to them.  COPD with ongoing tobacco use: No acute exacerbation.  Complete smoking cessation is recommended.  She has previously indicated "cigarettes will be with me until the day I die."  Pulmonary nodules: Followed by nodule clinic.      Disposition: F/u with Dr. Kirke Corin or an APP in 2 months for follow-up of echo and HTN.   Medication Adjustments/Labs and Tests Ordered: Current medicines are reviewed at length with the patient today.  Concerns regarding medicines are outlined above. Medication changes, Labs and Tests ordered today are summarized above and listed in the Patient Instructions accessible in Encounters.   Signed, Eula Listen, PA-C 04/07/2023 12:54 PM     [facility] [place] [address] [place], [place] [address] [phone]

## 2023-04-06 LAB — MULTIPLE MYELOMA PANEL, SERUM
Albumin SerPl Elph-Mcnc: 3.4 g/dL (ref 2.9–4.4)
Albumin/Glob SerPl: 1.4 (ref 0.7–1.7)
Alpha 1: 0.3 g/dL (ref 0.0–0.4)
Alpha2 Glob SerPl Elph-Mcnc: 0.8 g/dL (ref 0.4–1.0)
B-Globulin SerPl Elph-Mcnc: 0.9 g/dL (ref 0.7–1.3)
Gamma Glob SerPl Elph-Mcnc: 0.6 g/dL (ref 0.4–1.8)
Globulin, Total: 2.6 g/dL (ref 2.2–3.9)
IgA/Immunoglobulin A, Serum: 217 mg/dL (ref 64–422)
IgG (Immunoglobin G), Serum: 522 mg/dL — ABNORMAL LOW (ref 586–1602)
IgM (Immunoglobulin M), Srm: 19 mg/dL — ABNORMAL LOW (ref 26–217)
Total Protein: 6 g/dL (ref 6.0–8.5)

## 2023-04-07 ENCOUNTER — Ambulatory Visit: Payer: Medicare Other | Attending: Physician Assistant | Admitting: Physician Assistant

## 2023-04-07 ENCOUNTER — Encounter: Payer: Self-pay | Admitting: Physician Assistant

## 2023-04-07 VITALS — BP 142/100 | HR 92 | Ht 66.0 in | Wt 143.2 lb

## 2023-04-07 DIAGNOSIS — Z8679 Personal history of other diseases of the circulatory system: Secondary | ICD-10-CM | POA: Diagnosis not present

## 2023-04-07 DIAGNOSIS — I10 Essential (primary) hypertension: Secondary | ICD-10-CM

## 2023-04-07 DIAGNOSIS — E785 Hyperlipidemia, unspecified: Secondary | ICD-10-CM | POA: Diagnosis not present

## 2023-04-07 DIAGNOSIS — Z720 Tobacco use: Secondary | ICD-10-CM | POA: Diagnosis not present

## 2023-04-07 DIAGNOSIS — I6523 Occlusion and stenosis of bilateral carotid arteries: Secondary | ICD-10-CM | POA: Diagnosis not present

## 2023-04-07 DIAGNOSIS — I4819 Other persistent atrial fibrillation: Secondary | ICD-10-CM

## 2023-04-07 DIAGNOSIS — I2510 Atherosclerotic heart disease of native coronary artery without angina pectoris: Secondary | ICD-10-CM

## 2023-04-07 DIAGNOSIS — J449 Chronic obstructive pulmonary disease, unspecified: Secondary | ICD-10-CM | POA: Diagnosis not present

## 2023-04-07 DIAGNOSIS — I77819 Aortic ectasia, unspecified site: Secondary | ICD-10-CM | POA: Diagnosis not present

## 2023-04-07 DIAGNOSIS — I5032 Chronic diastolic (congestive) heart failure: Secondary | ICD-10-CM

## 2023-04-07 DIAGNOSIS — I350 Nonrheumatic aortic (valve) stenosis: Secondary | ICD-10-CM

## 2023-04-07 MED ORDER — LOSARTAN POTASSIUM 100 MG PO TABS: 100.0000 mg | ORAL_TABLET | Freq: Every day | ORAL | 3 refills | Status: DC

## 2023-04-07 NOTE — Patient Instructions (Addendum)
Medication Instructions:  Your physician recommends the following medication changes.  INCREASE: Losartan 100 mg daily   *If you need a refill on your cardiac medications before your next appointment, please call your pharmacy*   Lab Work: None ordered at this time    Testing/Procedures: Your physician has requested that you have an echocardiogram in January. Echocardiography is a painless test that uses sound waves to create images of your heart. It provides your doctor with information about the size and shape of your heart and how well your heart's chambers and valves are working.   You may receive an ultrasound enhancing agent through an IV if needed to better visualize your heart during the echo. This procedure takes approximately one hour.  There are no restrictions for this procedure.  This will take place at [address] [facility] [address], [place] [address]  Please note: We ask at that you not bring children with you during ultrasound (echo/ vascular) testing. Due to room size and safety concerns, children are not allowed in the ultrasound rooms during exams. Our front office staff cannot provide observation of children in our lobby area while testing is being conducted. An adult accompanying a patient to their appointment will only be allowed in the ultrasound room at the discretion of the ultrasound technician under special circumstances. We apologize for any inconvenience.    Follow-Up: At [facility], you and your health needs are our priority.  As part of our continuing mission to provide you with exceptional heart care, we have created designated Provider Care Teams.  These Care Teams include your primary Cardiologist (physician) and Advanced Practice Providers (APPs -  Physician Assistants and Nurse Practitioners) who all work together to provide you with the care you need, when you need it.  We recommend signing up for the patient portal called  "MyChart".  Sign up information is provided on this After Visit Summary.  MyChart is used to connect with patients for Virtual Visits (Telemedicine).  Patients are able to view lab/test results, encounter notes, upcoming appointments, etc.  Non-urgent messages can be sent to your provider as well.   To learn more about what you can do with MyChart, go to [email or web address].    Your next appointment:   2 month(s)  Provider:   You may see Lorine Bears, MD or one of the following Advanced Practice Providers on your designated Care Team:   Eula Listen, [place]

## 2023-04-10 ENCOUNTER — Encounter: Payer: Self-pay | Admitting: Cardiovascular Disease

## 2023-04-13 ENCOUNTER — Other Ambulatory Visit: Payer: Self-pay | Admitting: Emergency Medicine

## 2023-04-13 DIAGNOSIS — I10 Essential (primary) hypertension: Secondary | ICD-10-CM

## 2023-04-13 MED ORDER — LOSARTAN POTASSIUM 50 MG PO TABS: 50.0000 mg | ORAL_TABLET | Freq: Every day | ORAL | Status: DC

## 2023-04-18 ENCOUNTER — Encounter: Payer: Self-pay | Admitting: Pharmacist

## 2023-04-18 NOTE — Progress Notes (Signed)
Manufacturer Assistance Program (MAP) Application   Manufacturer: [employer] ([employer])    (Re-enrollment) Medication(s): Stiolto Respimat  Patient Portion of Application:  11/26: Mailed to patient's home per patient request. Filled out by pharmacist.  12/17: Received patient pages. Signed.  Income Documentation: Patient preference to mail a copy of income documentation to clinic.    Provider Portion of Application:  11/26: Provider portion placed in PCP inbox for signature. Filled out by pharmacist.  Prescription(s): Included in MAP application.   Application Status: Submitted via fax (pending approval)  Next Steps: [x]    PCP signature [x]    Upon signature(s) Application to be faxed to [facility] Fax: [phone] AND scanned into patient chart (placed in red scan folder) [x]    Patient signature  *[facility] clinic team - Please Addend/update this note as the "Next Steps" are completed in office*

## 2023-04-28 ENCOUNTER — Other Ambulatory Visit: Payer: Medicare Other

## 2023-05-08 ENCOUNTER — Ambulatory Visit: Payer: Medicare Other | Admitting: Pulmonary Disease

## 2023-05-16 ENCOUNTER — Ambulatory Visit: Payer: Medicare Other | Attending: Physician Assistant

## 2023-05-16 DIAGNOSIS — I351 Nonrheumatic aortic (valve) insufficiency: Secondary | ICD-10-CM

## 2023-05-16 LAB — ECHOCARDIOGRAM COMPLETE
AR max vel: 0.94 cm2
AV Area VTI: 0.85 cm2
AV Area mean vel: 0.87 cm2
AV Mean grad: 19 mm[Hg]
AV Peak grad: 33.4 mm[Hg]
Ao pk vel: 2.89 m/s
Calc EF: 61.2 %
S' Lateral: 2.9 cm
Single Plane A2C EF: 58.5 %
Single Plane A4C EF: 63.2 %

## 2023-05-18 ENCOUNTER — Ambulatory Visit: Payer: Medicare Other | Admitting: Pulmonary Disease

## 2023-05-18 ENCOUNTER — Encounter: Payer: Self-pay | Admitting: Pulmonary Disease

## 2023-05-18 VITALS — BP 122/82 | HR 85 | Temp 98.0°F | Ht 66.0 in | Wt 145.8 lb

## 2023-05-18 DIAGNOSIS — I350 Nonrheumatic aortic (valve) stenosis: Secondary | ICD-10-CM | POA: Diagnosis not present

## 2023-05-18 DIAGNOSIS — F17210 Nicotine dependence, cigarettes, uncomplicated: Secondary | ICD-10-CM | POA: Diagnosis not present

## 2023-05-18 DIAGNOSIS — J449 Chronic obstructive pulmonary disease, unspecified: Secondary | ICD-10-CM | POA: Diagnosis not present

## 2023-05-18 DIAGNOSIS — R0609 Other forms of dyspnea: Secondary | ICD-10-CM | POA: Diagnosis not present

## 2023-05-18 MED ORDER — ALBUTEROL SULFATE HFA 108 (90 BASE) MCG/ACT IN AERS: 2.0000 | INHALATION_SPRAY | Freq: Four times a day (QID) | RESPIRATORY_TRACT | 2 refills | Status: DC | PRN

## 2023-05-18 NOTE — Progress Notes (Signed)
Subjective:    Patient ID: Tracy Bell, female    DOB: July 26, 1950, 73 y.o.   MRN: [id number]  Patient Care Team: Allegra Grana, FNP as PCP - General (Family Medicine) Iran Ouch, MD as PCP - Cardiology (Cardiology)  Chief Complaint  Patient presents with   Follow-up    SOB in the mornings. Wakes you in the morning with drainage in her throat.  Cough with white sputum. No wheezing.     BACKGROUND: Patient is a 73 year old current smoker whom I have not seen since 2021.  The patient opted to switch practices and was most recently seeing Dr. Georga Hacking.  She last saw Dr. Karna Christmas in August 2024.  She cites communication issues with Dr. Karna Christmas and wanted to return to the practice here.  HPI Discussed the use of AI scribe software for clinical note transcription with the patient, who gave verbal consent to proceed.  History of Present Illness   The patient, with known COPD and a current smoker, presents for a follow-up after a significant gap. She reports a reduction in smoking, currently consuming around 10-12 cigarettes per day, often not finishing the entire cigarette. She has been under the care of multiple providers, but has struggled to establish a consistent relationship with a single provider.  The patient reports worsening shortness of breath since her last visit in 2021. She has been practicing pursed-lip breathing several times a day to manage her symptoms. She also reports a persistent cough, particularly in the mornings, with thick, stringy, white sputum. She has been rinsing her mouth after using her inhaler, which she believes has helped manage the issue.  She has been experiencing oral thrush since starting her inhaler, which has been managed with antifungal medication. She has not been on any antibiotics prior to the development of thrush and does not have a history of diabetes.  The patient also reports leg swelling and dry, flaky skin, which she  believes is related to her heart condition. She has a known heart murmur and has been under the care of a cardiologist. She has recently undergone an echocardiogram and is due for a follow-up with her cardiologist in February.  [place] at [place]. She has not been tested for allergies. She has expressed a preference for scheduling any further tests in the warmer months due to difficulties with travel in snowy conditions. She is currently on a maintenance inhaler and has been offered an emergency inhaler for use as needed.   She does not endorse any recent fevers, chills or sweats.  She has a chronic cough in the mornings productive of whitish sputum with no hemoptysis.  No change in the quality of her sputum.  No chest pain.  She has had lower extremity edema particularly towards the end of the day.  She underwent PFTs on 06 Sep 2019. FEV1 was 1.19 L which is 48% predicted FVC 1.85 L or 57% predicted with an FEV1/FVC of 64%. There was no significant bronchodilator response. Lung volumes were overall normal with evidence of air trapping with RV at 131%. 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   Review of Systems A 10 point review of systems was performed and it is as noted above otherwise negative.   Past Medical History:  Diagnosis Date   Anxiety    COPD (chronic obstructive pulmonary disease) (HCC)  Cough    Depression    Dizziness    Dyspnea    Heart murmur    Hyperlipidemia    Hypertension    Lightheadedness    Personal history of tobacco use, presenting hazards to health 08/28/2015    Past Surgical History:  Procedure Laterality Date   ABDOMINAL HYSTERECTOMY  1980   complete   BREAST BIOPSY  1970   normal   DILATION AND CURETTAGE OF UTERUS     ENDARTERECTOMY Right 07/12/2018   Procedure: ENDARTERECTOMY CAROTID;  Surgeon: Annice Needy, MD;  Location: [facility]  ORS;  Service: Vascular;  Laterality: Right;   TUBAL LIGATION      Patient Active Problem List   Diagnosis Date Noted   Hair thinning 03/10/2023   Vaginal bleeding 03/10/2023   Pedal edema 03/10/2023   Eyelid abnormality 03/10/2023   Cholelithiasis 10/04/2022   Atrial fibrillation (HCC) 07/18/2022   Subclinical hypothyroidism 07/14/2021   Pain in limb 10/28/2020   Abnormal findings on diagnostic imaging of lung 06/18/2020   Leg pain, lateral, left 06/12/2020   Healthcare maintenance 01/13/2020   Do not resuscitate discussion 12/03/2019   Lymphadenopathy 10/14/2019   Urinary incontinence 10/14/2019   SVT (supraventricular tachycardia) (HCC) 06/21/2019   Palpitations 07/27/2018   Carotid stenosis, right 07/12/2018   Carotid stenosis, bilateral 05/10/2018   Premature atrial contractions 05/01/2018   Erythrocytosis 04/14/2018   Left carotid bruit 04/10/2018   Thoracic aortic aneurysm without rupture (HCC) 04/10/2018   SOB (shortness of breath) on exertion 04/05/2018   Depression, recurrent (HCC) 03/23/2018   Dizziness 03/23/2018   Abdominal aneurysm (HCC) 03/23/2018   Chest pain with high risk for cardiac etiology 12/15/2015   Stage 2 moderate COPD by GOLD classification (HCC) 12/15/2015   Nodule of left lung 11/17/2015   Hypercholesteremia 11/17/2015   Coronary atherosclerosis 11/05/2015   Personal history of tobacco use, presenting hazards to health 08/28/2015   Adjustment disorder with mixed anxiety and depressed mood 09/30/2014   Essential hypertension 05/22/2014   Atherosclerosis of aorta (HCC) 04/17/2014   Aortic stenosis, mild 04/17/2014   Tobacco use disorder 03/14/2014   Special screening for malignant neoplasms, colon 03/14/2014   Murmur 03/14/2014   Dysphagia 03/14/2014   Tobacco dependence syndrome 03/14/2014    Family History  Problem Relation Age of Onset   Cancer Mother        Pancreatic Cancer    Cancer Father        Stomach Cancer    Lung cancer  Sister    Cancer Sister 74       ovarian   Lung cancer Sister     Social History   Tobacco Use   Smoking status: Every Day    Current packs/day: 2.00    Average packs/day: 2.0 packs/day for 60.3 years (120.5 ttl pk-yrs)    Types: Cigarettes    Start date: 05/03/1963   Smokeless tobacco: Never   Tobacco comments:    10-12 cigarettes a day- khj 05/18/2023  Substance Use Topics   Alcohol use: Yes    Alcohol/week: 0.0 standard drinks of alcohol    Comment: [place] occasionally    No Known Allergies  Current Meds  Medication Sig   albuterol (VENTOLIN HFA) 108 (90 Base) MCG/ACT inhaler Inhale 2 puffs into the lungs every 6 (six) hours as needed for wheezing or shortness of breath.   apixaban (ELIQUIS) 5 MG TABS tablet Take 1 tablet (5 mg total) by mouth 2 (two) times daily.   atorvastatin (  LIPITOR) 80 MG tablet TAKE 1 TABLET BY MOUTH EVERY DAY   Biotin 5000 MCG CAPS Take by mouth.   cholecalciferol (VITAMIN D3) 25 MCG (1000 UT) tablet Take 1,000 Units by mouth daily.   diltiazem (CARDIZEM CD) 360 MG 24 hr capsule Take 1 capsule (360 mg total) by mouth daily.   furosemide (LASIX) 20 MG tablet Take 1 tablet (20 mg total) by mouth daily as needed for edema (Take as needed for swelling or weight gain greater than 3 pounds overnight). TAKE Furosemide 20 mg once daily for 3 days THEN as needed   losartan (COZAAR) 50 MG tablet Take 1 tablet (50 mg total) by mouth daily.   Multiple Vitamins-Minerals (CENTRUM ADULTS) TABS Take 1 tablet by mouth daily.   Tiotropium Bromide-Olodaterol (STIOLTO RESPIMAT) 2.5-2.5 MCG/ACT AERS Inhale 2 puffs into the lungs daily.    Immunization History  Administered Date(s) Administered   Fluad Quad(high Dose 65+) 01/17/2020   PFIZER Comirnaty(Gray Top)Covid-19 Tri-Sucrose Vaccine 07/03/2020   PFIZER(Purple Top)SARS-COV-2 Vaccination 08/01/2019, 08/28/2019   Pneumococcal Polysaccharide-23 07/13/2018   Tdap 12/02/2014        Objective:     BP  122/82 (BP Location: Left Arm, Cuff Size: Normal)   Pulse 85   Temp 98 F (36.7 C)   Ht 5\' 6"  (1.676 m)   Wt 145 lb 12.8 oz (66.1 kg)   SpO2 95%   BMI 23.53 kg/m   SpO2: 95 % O2 Device: None (Room air)  GENERAL: Awake, alert, fully ambulatory.  No respiratory distress, no use of accessories    HEAD: Normocephalic, atraumatic.  EYES: Pupils equal, round, reactive to light.  No scleral icterus.  MOUTH: Oral mucosa moist.  No significant thrush.Marland Kitchen NECK: Supple. No thyromegaly. Trachea midline. No JVD.    Query shotty adenopathy on the right anterior cervical chain, no tenderness. PULMONARY: Good air entry bilaterally.  Scattered rhonchi noted bilaterally.  No wheezes.  No rales. CARDIOVASCULAR: S1 and S2. Regular rate and rhythm.  She has a harsh 2/6 to 3/6 murmur consistent with aortic stenosis.  No rubs or gallops noted. GASTROINTESTINAL: No distention noted. MUSCULOSKELETAL: No joint deformity, no clubbing, +1-2 edema.  NEUROLOGIC: Awake, alert, fully oriented.  Fluent speech.  No overt focal deficit. SKIN: Intact,warm,dry. PSYCH: Mood and behavior appropriate.  Ambulatory oxymetry was performed today:  At rest on room air oxygen saturation was 94%, the patient ambulated at a XXX pace, completed 2 laps, O2 nadir 93%,no shortness of breath.  Resting heart rate was 80 bpm at maximum for this exercise 95 bpm.  Patient was limited due to hip pain not due to dyspnea.       Assessment & Plan:     ICD-10-CM   1. Stage 2 moderate COPD by GOLD classification (HCC)  J44.9 Overnight Pulse Oximetry Study    Pulmonary Function Test [facility] Only    2. DOE (dyspnea on exertion)  R06.09 Overnight Pulse Oximetry Study    Pulmonary Function Test [facility] Only    3. Aortic stenosis, moderate  I35.0     4. Tobacco dependence due to cigarettes  F17.210       Orders Placed This Encounter  Procedures   Overnight Pulse Oximetry Study    Room Air DME: [facility]    Standing Status:   Future     Expiration Date:   05/17/2024   Pulmonary Function Test [facility] Only    Standing Status:   Future    Expected Date:   08/28/2023  Expiration Date:   05/17/2024    Full PFT: includes the following: basic spirometry, spirometry pre & post bronchodilator, diffusion capacity (DLCO), lung volumes:   Full PFT    This test can only be performed at:   [facility] ordered this encounter  Medications   albuterol (VENTOLIN HFA) 108 (90 Base) MCG/ACT inhaler    Sig: Inhale 2 puffs into the lungs every 6 (six) hours as needed for wheezing or shortness of breath.    Dispense:  8 g    Refill:  2   Discussion:    Chronic Obstructive Pulmonary Disease (COPD) Known COPD with increased dyspnea since last visit in 2021. Currently smoking 10-12 cigarettes/day. Reports occasional wheezing and thick, stringy saliva in the mornings. No current rescue inhaler. Physical exam reveals heart murmur and bilateral leg swelling, likely cardiac-related. Discussed smoking cessation and rescue inhaler use for acute symptoms. - Prescribe rescue inhaler for PRN use - Order PFT's  in May (patient's request) - Order overnight oximetry test - Schedule follow-up in three months  Oral Thrush Recurrent oral thrush since starting Stiolto inhaler in 2021. No recent antibiotic use or URIs. Rinsing mouth post-inhaler use; advised to add baking soda to rinse. Discussed avoiding hydrogen peroxide due to tissue damage risk.  Unsure if what she is really experiencing is thrush given that Stiolto does not contain ICS. - Advise rinsing mouth with water and baking soda post-inhaler use  Heart Murmur with Aortic Stenosis and Mitral Valve Regurgitation Known heart murmur with aortic stenosis and mitral valve regurgitation. Reports bilateral leg swelling and dry, flaky skin. Recent echocardiogram; follow-up with cardiology,  Eula Listen, PA-C in February. Discussed potential future aortic valve replacement, possibly via catheter if  dyspnea worsens. - Continue cardiology follow-up - Address leg swelling and skin issues with cardiology  General Health Maintenance Current smoker with difficulty quitting. Discussed smoking cessation for overall health and COPD management. - Encourage smoking cessation and provide support resources - Counseled, total counseling time > 8 minutes  Follow-up - Schedule follow-up in three months - Follow-up with cardiologist in February.      Advised if symptoms do not improve or worsen, to please contact office for sooner follow up or seek emergency care.    I spent 40 minutes of dedicated to the care of this patient on the date of this encounter to include pre-visit review of records, face-to-face time with the patient discussing conditions above, post visit ordering of testing, clinical documentation with the electronic health record, making appropriate referrals as documented, and communicating necessary findings to members of the patients care team.   C. Danice Goltz, MD Advanced Bronchoscopy [facility]    *This note was dictated using voice recognition software/Dragon.  Despite best efforts to proofread, errors can occur which can change the meaning. Any transcriptional errors that result from this process are unintentional and may not be fully corrected at the time of dictation.

## 2023-05-18 NOTE — Patient Instructions (Addendum)
VISIT SUMMARY:  During today's visit, we discussed your ongoing health concerns, including your COPD, oral thrush, and heart murmur. We reviewed your symptoms, current treatments, and made plans for further management and follow-up.  YOUR PLAN:  -CHRONIC OBSTRUCTIVE PULMONARY DISEASE (COPD): COPD is a chronic lung condition that makes it hard to breathe. You reported increased shortness of breath and a persistent cough. We discussed the importance of smoking cessation and prescribed a rescue inhaler for acute symptoms. We also ordered a spirometry test in May and an overnight oximetry test to assess your lung function. Please schedule a follow-up appointment in three months.  -ORAL THRUSH: Oral thrush is a fungal infection in the mouth. You have been experiencing this since starting your inhaler. We recommend rinsing your mouth with water and baking soda after using your inhaler to help manage this condition. Avoid using hydrogen peroxide as it can damage tissues.  -HEART MURMUR WITH AORTIC STENOSIS AND MITRAL VALVE REGURGITATION: A heart murmur with aortic stenosis and mitral valve regurgitation means that there are issues with the heart valves that can affect blood flow. You reported leg swelling and dry, flaky skin. Continue your follow-up with your cardiologist, Carola Frost in February to address these issues and discuss potential future treatments.  -GENERAL HEALTH MAINTENANCE: We discussed the importance of smoking cessation for your overall health and to better manage your COPD. Please consider using the support resources provided to help you quit smoking.  INSTRUCTIONS:  Please schedule a follow-up appointment in three months and continue your follow-up with your cardiologist, Carola Frost in February. We will also need to conduct a spirometry test in May and an overnight oximetry test to assess your lung function.

## 2023-05-22 ENCOUNTER — Encounter: Payer: Self-pay | Admitting: Family

## 2023-05-28 ENCOUNTER — Encounter: Payer: Self-pay | Admitting: Pulmonary Disease

## 2023-06-08 ENCOUNTER — Other Ambulatory Visit: Payer: Self-pay | Admitting: Physician Assistant

## 2023-06-13 ENCOUNTER — Ambulatory Visit: Payer: Medicare Other | Admitting: Physician Assistant

## 2023-06-13 NOTE — Progress Notes (Deleted)
 Cardiology Office Note    Date:  06/13/2023   ID:  Tracy Bell, Tracy Bell [birth year], MRN [id number]  PCP:  Allegra Grana, FNP  Cardiologist:  Lorine Bears, MD  Electrophysiologist:  None   Chief Complaint: Follow-up  History of Present Illness:   Tracy Bell is a 73 y.o. female with history of coronary artery calcification noted on CT imaging, persistent A-fib, atrial flutter diagnosed in 12/2019, SVT, second-degree AV block type I, mild to moderate aortic stenosis, ectatic aorta, carotid artery disease status post right-sided CEA in 07/2018, COPD, HTN, HLD, pulmonary nodules, and ongoing tobacco use who presents for follow-up of Afib.   She was previously followed by Dr. Darrold Junker with CT of the lungs showing evidence of coronary calcification.  Stress echo in 12/2015 showed normal LV systolic function with mild aortic stenosis and no evidence of ischemia with stress.  She had palpitations in 2019 with a 72-hour monitor showing sinus rhythm with frequent PACs and occasional brief atrial runs lasting up to 7 beats.  Echo from 04/2018 showed normal LV systolic function with mild to moderate aortic stenosis with a mean gradient of 13 mmHg and a valve area of 1.16 cm.   She transitioned her care to Dr. Kirke Corin in 05/2019 for evaluation of tachypalpitations.  Zio patch at that time showed sinus rhythm with an average rate of 65 bpm, sinus bradycardia during the early morning hours with the lowest heart rate of 43 bpm, frequent episodes of SVT totaling 100 runs with the longest episode lasting 15 minutes and 54 seconds, and intermittent Mobitz type I second-degree AV block in the early morning hours.  Echo in 2021 showed an EF of 60 to 65%, borderline LVH, grade 2 diastolic dysfunction, no regional wall motion abnormalities, normal RV systolic function, mild tricuspid regurgitation, mild to moderate aortic stenosis with a mean gradient of 14 mmHg and a valve area of 1.4 cm.  She was diagnosed  with atrial flutter in 12/2019 and initiated on apixaban.  She was noted to have converted to sinus rhythm with titration of diltiazem in follow-up in 01/2020.    She was evaluated by her PCP on 05/04/2022 for dizziness.  She was found to be in rate controlled A-fib at that time.  Given this finding, she followed up in our office on 05/05/2022 with continued dizziness that she indicated dated back to mid December 2023.  She reported sometimes forgetting to take her second dose of apixaban.  Given missed doses of apixaban, MRI of the brain was ordered and remains pending at this time.  To quantify A-fib burden, she underwent Zio patch which showed a 100% A-fib burden with an average ventricular rate of 91 bpm with a range of 42 to 189 bpm.  Echo from 05/2022 showed an EF of 60 to 65%, no regional wall motion abnormalities, mild LVH, normal RV systolic function and ventricular cavity size, mildly elevated PASP estimated at 37.7 mmHg, moderately dilated left atrium, mild mitral regurgitation, moderate aortic valve stenosis with a mean gradient of 18 mmHg and a valve area of 1.2 cm, and an estimated right atrial pressure of 3 mmHg.  She was seen in the office in 06/2022 noting some improvement in her dizziness.  She remained in rate controlled A-fib.  She did continue to miss evening doses of apixaban and was not interested in transitioning to rivaroxaban.  She was seen in the office on 07/26/2022 and had been more compliant with her apixaban, though still  reported missing one dose every 1 to 2 weeks.  She was felt to be volume up with a weight that was up 5 pounds when compared to prior visit.  BNP 498 at that time.  She was advised to take Lasix 20 mg daily for 3 days followed by as needed thereafter.  She was last seen in the office in 08/2022 and was without symptoms of angina or cardiac decompensation.  Her weight was down 5 pounds when compared to prior visit.  She did note some ankle swelling at times and spent a fair  amount of time sitting at a table working on art and playing on the computer.  She denied missing any doses of apixaban, though did not want to pursue rhythm control strategy.  She preferred to not transition off diltiazem.  She continued to smoke and was not interested in quitting.   With regards to her vascular disease, carotid artery ultrasound from 10/2022 showed 1 to 39% bilateral ICA stenoses with bilateral antegrade flow of the vertebral arteries and normal flow hemodynamics in the bilateral subclavian arteries.  Most recent AAA ultrasound from 10/2022 showed mild dilatation of the distal aorta proximal aorta measuring up to 3.19 cm and not aneurysmal.  She was last seen in the office in 04/2023 and was without symptoms of angina or cardiac decompensation.  Dizziness was only noticeable if she rotated her head to the left.  She reported missing her evening dose of apixaban approximately 1-2 times per month and did not want to transition to alternative anticoagulation.  Again declined DCCV.  She continued to smoke and was not interested in quitting.  Her weight was down 20 pounds when compared to revisit in 08/2022.  She indicated that was in the setting of diminished oral intake following an episode of thrush that was resolved with improving appetite.  ***   Labs independently reviewed: 03/2023 - potassium 3.7, BUN 14, serum creatinine 1.2, Hgb 14.6, PLT 220, TSH normal, albumin 0.0, AST normal, ALT 40 06/2021 - TC 137, TG 92, HDL 44, LDL 74  08/2020 - A1c 5.8  Past Medical History:  Diagnosis Date   Anxiety    COPD (chronic obstructive pulmonary disease) (HCC)    Cough    Depression    Dizziness    Dyspnea    Heart murmur    Hyperlipidemia    Hypertension    Lightheadedness    Personal history of tobacco use, presenting hazards to health 08/28/2015    Past Surgical History:  Procedure Laterality Date   ABDOMINAL HYSTERECTOMY  1980   complete   BREAST BIOPSY  1970   normal    DILATION AND CURETTAGE OF UTERUS     ENDARTERECTOMY Right 07/12/2018   Procedure: ENDARTERECTOMY CAROTID;  Surgeon: Annice Needy, MD;  Location: [facility] ORS;  Service: Vascular;  Laterality: Right;   TUBAL LIGATION      Current Medications: No outpatient medications have been marked as taking for the 06/13/23 encounter (Appointment) with Sondra Barges, PA-C.    Allergies:   Patient has no known allergies.   Social History   Socioeconomic History   Marital status: Widowed    Spouse name: Not on file   Number of children: Not on file   Years of education: Not on file   Highest education level: Not on file  Occupational History   Occupation: retired  Tobacco Use   Smoking status: Every Day    Current packs/day: 2.00    Average  packs/day: 2.0 packs/day for 60.3 years (120.6 ttl pk-yrs)    Types: Cigarettes    Start date: 05/03/1963   Smokeless tobacco: Never   Tobacco comments:    10-12 cigarettes a day- khj 05/18/2023  Vaping Use   Vaping status: Never Used  Substance and Sexual Activity   Alcohol use: Yes    Alcohol/week: 0.0 standard drinks of alcohol    Comment: [place] occasionally   Drug use: No   Sexual activity: Not on file  Other Topics Concern   Not on file  Social History Narrative   Lives with husband. Has 11 pets in home. Has one son, lives locally.   Was born and raised in [place].   Husband is older than her and she provides care for him.      Work - retired. Invented water bottle for pets   Social Drivers of Health   Financial Resource Strain: Medium Risk (04/13/2021)   Overall Financial Resource Strain (CARDIA)    Difficulty of Paying Living Expenses: Somewhat hard  Food Insecurity: Not on file  Transportation Needs: Not on file  Physical Activity: Not on file  Stress: Not on file  Social Connections: Not on file     Family History:  The patient's family history includes Cancer in her father and mother; Cancer (age of onset: 75) in her  sister; Lung cancer in her sister and sister.  ROS:   12-point review of systems is negative unless otherwise noted in the HPI.   EKGs/Labs/Other Studies Reviewed:    Studies reviewed were summarized above. The additional studies were reviewed today:  Zio patch 05/2022: Atrial Fibrillation occurred continuously (100% burden), ranging from 42-189 bpm (avg of 91 bpm).  Rare PVCs. __________   2D echo 05/13/2022: 1. Left ventricular ejection fraction, by estimation, is 60 to 65%. The  left ventricle has normal function. The left ventricle has no regional  wall motion abnormalities. There is mild left ventricular hypertrophy.  Left ventricular diastolic parameters  are indeterminate.   2. Right ventricular systolic function is normal. The right ventricular  size is normal. There is mildly elevated pulmonary artery systolic  pressure. The estimated right ventricular systolic pressure is 37.7 mmHg.   3. Left atrial size was moderately dilated.   4. The mitral valve is normal in structure. Mild mitral valve  regurgitation. No evidence of mitral stenosis.   5. The aortic valve is normal in structure. Aortic valve regurgitation is  mild. Moderate aortic valve stenosis. Aortic valve area, by VTI measures  1.20 cm. Aortic valve mean gradient measures 18.0 mmHg. Aortic valve Vmax  measures 2.68 m/s.   6. The inferior vena cava is normal in size with greater than 50%  respiratory variability, suggesting right atrial pressure of 3 mmHg.   Comparison(s): Previous Echo showed LV EF 60-65%, normal LV function, mild  to moderate AS, mean gradient 14 mmHg.  __________   2D echo 06/05/2019: 1. Left ventricular ejection fraction, by visual estimation, is 60 to  65%. The left ventricle has normal function. There is borderline left  ventricular hypertrophy.   2. Left ventricular diastolic parameters are consistent with Grade II  diastolic dysfunction (pseudonormalization).   3. The left ventricle  has no regional wall motion abnormalities.   4. Global right ventricle has normal systolic function.The right  ventricular size is normal. Right vetricular wall thickness was not  assessed.   5. Left atrial size was normal.   6. Right atrial size was normal.  7. The mitral valve is normal in structure. No evidence of mitral valve  regurgitation.   8. The tricuspid valve is normal in structure.   9. The tricuspid valve is normal in structure. Tricuspid valve  regurgitation is mild.  10. The aortic valve is abnormal. Aortic valve regurgitation is not  visualized. Mild to moderate aortic valve stenosis.  11. Peak AV gradient , MG , DVI 0.45, with LVOT diameter of  2cm, AVA 1.4cm2.  12. The pulmonic valve was not well visualized. Pulmonic valve  regurgitation is not visualized.  13. Mildly elevated pulmonary artery systolic pressure.  14. The inferior vena cava is normal in size with greater than 50%  respiratory variability, suggesting right atrial pressure of 3 mmHg.  __________   [place] patch 05/2019: Normal sinus rhythm with an average heart rate of 65 bpm. Sinus bradycardia during early morning hours with lowest heart rate of 43 bpm. Frequent episodes of SVT with a total of 100 runs.  The longest run lasted 50 minutes and 54 seconds. Intermittent Mobitz 1 second-degree AV block and early morning hours. Most triggered events correlated with SVT.   EKG:  EKG is ordered today.  The EKG ordered today demonstrates ***  Recent Labs: 07/26/2022: B Natriuretic Peptide 498.8 03/10/2023: ALT 40; TSH 3.70 03/29/2023: BUN 14; Creatinine, Ser 1.20; Hemoglobin 14.6; Platelets 220.0; Potassium 3.7; Sodium 140  Recent Lipid Panel    Component Value Date/Time   CHOL 137 07/12/2021 1400   TRIG 92.0 07/12/2021 1400   HDL 44.70 07/12/2021 1400   CHOLHDL 3 07/12/2021 1400   VLDL 18.4 07/12/2021 1400   LDLCALC 74 07/12/2021 1400    PHYSICAL EXAM:    VS:  There were no vitals taken  for this visit.  BMI: There is no height or weight on file to calculate BMI.  Physical Exam  Wt Readings from Last 3 Encounters:  05/18/23 145 lb 12.8 oz (66.1 kg)  04/07/23 143 lb 3.2 oz (65 kg)  03/10/23 151 lb 12.8 oz (68.9 kg)     ASSESSMENT & PLAN:   Persistent A-fib/flutter:  HFpEF:  Coronary artery calcification:  Aortic stenosis:  History of second-degree AV block type I:  HTN: Blood pressure  HLD: LDL 74 in 03/2023.  Carotid artery disease:  Ectatic thoracic/abdominal aorta:  COPD with ongoing tobacco use:   {Are you ordering a CV Procedure (e.g. stress test, cath, DCCV, TEE, etc)?   Press F2        :[id number]}     Disposition: F/u with Dr. Kirke Corin or an APP in ***.   Medication Adjustments/Labs and Tests Ordered: Current medicines are reviewed at length with the patient today.  Concerns regarding medicines are outlined above. Medication changes, Labs and Tests ordered today are summarized above and listed in the Patient Instructions accessible in Encounters.   Signed, Eula Listen, PA-C 06/13/2023 8:52 AM     [facility] [place] [address] [place], [place] [address] [phone]

## 2023-06-14 NOTE — Progress Notes (Unsigned)
Cardiology Office Note    Date:  06/15/2023   ID:  Tracy Bell, Tracy Bell Aug 14, 1950, MRN [id number]  PCP:  Allegra Grana, FNP  Cardiologist:  Lorine Bears, MD  Electrophysiologist:  None   Chief Complaint: Follow-up  History of Present Illness:   Tracy Bell is a 73 y.o. female with history of coronary artery calcification noted on CT imaging, persistent A-fib, atrial flutter diagnosed in 12/2019, SVT, second-degree AV block type I, mild to moderate aortic stenosis, ectatic aorta, carotid artery disease status post right-sided CEA in 07/2018, COPD, HTN, HLD, pulmonary nodules, and ongoing tobacco use who presents for follow-up of Afib.   She was previously followed by Dr. Darrold Junker with CT of the lungs showing evidence of coronary calcification.  Stress echo in 12/2015 showed normal LV systolic function with mild aortic stenosis and no evidence of ischemia with stress.  She had palpitations in 2019 with a 72-hour monitor showing sinus rhythm with frequent PACs and occasional brief atrial runs lasting up to 7 beats.  Echo from 04/2018 showed normal LV systolic function with mild to moderate aortic stenosis with a mean gradient of 13 mmHg and a valve area of 1.16 cm.   She transitioned her care to Dr. Kirke Corin in 05/2019 for evaluation of tachypalpitations.  Zio patch at that time showed sinus rhythm with an average rate of 65 bpm, sinus bradycardia during the early morning hours with the lowest heart rate of 43 bpm, frequent episodes of SVT totaling 100 runs with the longest episode lasting 15 minutes and 54 seconds, and intermittent Mobitz type I second-degree AV block in the early morning hours.  Echo in 2021 showed an EF of 60 to 65%, borderline LVH, grade 2 diastolic dysfunction, no regional wall motion abnormalities, normal RV systolic function, mild tricuspid regurgitation, mild to moderate aortic stenosis with a mean gradient of 14 mmHg and a valve area of 1.4 cm.  She was diagnosed  with atrial flutter in 12/2019 and initiated on apixaban.  She was noted to have converted to sinus rhythm with titration of diltiazem in follow-up in 01/2020.    She was evaluated by her PCP on 05/04/2022 for dizziness.  She was found to be in rate controlled A-fib at that time.  Given this finding, she followed up in our office on 05/05/2022 with continued dizziness that she indicated dated back to mid December 2023.  She reported sometimes forgetting to take her second dose of apixaban.  Given missed doses of apixaban, MRI of the brain was ordered and remains pending at this time.  To quantify A-fib burden, she underwent Zio patch which showed a 100% A-fib burden with an average ventricular rate of 91 bpm with a range of 42 to 189 bpm.  Echo from 05/2022 showed an EF of 60 to 65%, no regional wall motion abnormalities, mild LVH, normal RV systolic function and ventricular cavity size, mildly elevated PASP estimated at 37.7 mmHg, moderately dilated left atrium, mild mitral regurgitation, moderate aortic valve stenosis with a mean gradient of 18 mmHg and a valve area of 1.2 cm, and an estimated right atrial pressure of 3 mmHg.  She was seen in the office in 06/2022 noting some improvement in her dizziness.  She remained in rate controlled A-fib.  She did continue to miss evening doses of apixaban and was not interested in transitioning to rivaroxaban.  She was seen in the office on 07/26/2022 and had been more compliant with her apixaban, though still  reported missing one dose every 1 to 2 weeks.  She was felt to be volume up with a weight that was up 5 pounds when compared to prior visit.  BNP 498 at that time.  She was advised to take Lasix 20 mg daily for 3 days followed by as needed thereafter.  She was last seen in the office in 08/2022 and was without symptoms of angina or cardiac decompensation.  Her weight was down 5 pounds when compared to prior visit.  She did note some ankle swelling at times and spent a fair  amount of time sitting at a table working on art and playing on the computer.  She denied missing any doses of apixaban, though did not want to pursue rhythm control strategy.  She preferred to not transition off diltiazem.  She continued to smoke and was not interested in quitting.   With regards to her vascular disease, carotid artery ultrasound from 10/2022 showed 1 to 39% bilateral ICA stenoses with bilateral antegrade flow of the vertebral arteries and normal flow hemodynamics in the bilateral subclavian arteries.  Most recent AAA ultrasound from 10/2022 showed mild dilatation of the distal aorta proximal aorta measuring up to 3.19 cm and not aneurysmal.  She was last seen in the office in 04/2023 and was without symptoms of angina or cardiac decompensation.  Dizziness was only noticeable if she rotated her head to the left.  She reported missing her evening dose of apixaban approximately 1-2 times per month and did not want to transition to alternative anticoagulation.  Again declined DCCV.  She continued to smoke and was not interested in quitting.  Her weight was down 20 pounds when compared to revisit in 08/2022.  She indicated that was in the setting of diminished oral intake following an episode of thrush that was resolved with improving appetite.  Losartan was titrated to 100 mg, though with this she reported an increase in nocturia leading the medication to be reduced back to 50 mg.  She comes in doing reasonably well from a cardiac perspective and is without symptoms of angina or cardiac decompensation.  Dizziness remains improved.  No palpitations, presyncope, or syncope.  No falls or hematochezia.  Has concerns regarding financial constraints and ongoing apixaban use.  Does continue to miss some doses of apixaban intermittently.  Continues to smoke and is not ready to quit.  Reports that she has PAD.  Difficult to assess for claudication with largely sedentary lifestyle.  Prefers to sit at home  for cath.  Concerned about a skin lesion on the left forearm and a change in her left great toenail.   Labs independently reviewed: 03/2023 - potassium 3.7, BUN 14, serum creatinine 1.2, Hgb 14.6, PLT 220, TSH normal, albumin 0.0, AST normal, ALT 40 06/2021 - TC 137, TG 92, HDL 44, LDL 74  08/2020 - A1c 5.8  Past Medical History:  Diagnosis Date   Anxiety    COPD (chronic obstructive pulmonary disease) (HCC)    Cough    Depression    Dizziness    Dyspnea    Heart murmur    Hyperlipidemia    Hypertension    Lightheadedness    Personal history of tobacco use, presenting hazards to health 08/28/2015    Past Surgical History:  Procedure Laterality Date   ABDOMINAL HYSTERECTOMY  1980   complete   BREAST BIOPSY  1970   normal   DILATION AND CURETTAGE OF UTERUS     ENDARTERECTOMY Right 07/12/2018  Procedure: ENDARTERECTOMY CAROTID;  Surgeon: Annice Needy, MD;  Location: [facility] ORS;  Service: Vascular;  Laterality: Right;   TUBAL LIGATION      Current Medications: Current Meds  Medication Sig   albuterol (VENTOLIN HFA) 108 (90 Base) MCG/ACT inhaler Inhale 2 puffs into the lungs every 6 (six) hours as needed for wheezing or shortness of breath.   apixaban (ELIQUIS) 5 MG TABS tablet Take 1 tablet (5 mg total) by mouth 2 (two) times daily.   apixaban (ELIQUIS) 5 MG TABS tablet Take 1 tablet (5 mg total) by mouth 2 (two) times daily.   atorvastatin (LIPITOR) 80 MG tablet TAKE 1 TABLET BY MOUTH EVERY DAY   Biotin 5000 MCG CAPS Take by mouth.   cholecalciferol (VITAMIN D3) 25 MCG (1000 UT) tablet Take 1,000 Units by mouth daily.   diltiazem (CARDIZEM CD) 360 MG 24 hr capsule TAKE 1 CAPSULE BY MOUTH EVERY DAY   furosemide (LASIX) 20 MG tablet Take 1 tablet (20 mg total) by mouth daily as needed for edema (Take as needed for swelling or weight gain greater than 3 pounds overnight). TAKE Furosemide 20 mg once daily for 3 days THEN as needed   losartan (COZAAR) 50 MG tablet Take 1 tablet (50  mg total) by mouth daily.   Multiple Vitamins-Minerals (CENTRUM ADULTS) TABS Take 1 tablet by mouth daily.   Tiotropium Bromide-Olodaterol (STIOLTO RESPIMAT) 2.5-2.5 MCG/ACT AERS Inhale 2 puffs into the lungs daily.    Allergies:   Patient has no known allergies.   Social History   Socioeconomic History   Marital status: Widowed    Spouse name: Not on file   Number of children: Not on file   Years of education: Not on file   Highest education level: Not on file  Occupational History   Occupation: retired  Tobacco Use   Smoking status: Every Day    Current packs/day: 2.00    Average packs/day: 2.0 packs/day for 60.3 years (120.6 ttl pk-yrs)    Types: Cigarettes    Start date: 05/03/1963   Smokeless tobacco: Never   Tobacco comments:    10-12 cigarettes a day- khj 05/18/2023  Vaping Use   Vaping status: Never Used  Substance and Sexual Activity   Alcohol use: Yes    Alcohol/week: 0.0 standard drinks of alcohol    Comment: [place]um[place] occasionally   Drug use: No   Sexual activity: Not on file  Other Topics Concern   Not on file  Social History Narrative   Lives with husband. Has 11 pets in home. Has one son, lives locally.   Was born and raised in [place].   Husband is older than her and she provides care for him.      Work - retired. Invented water bottle for pets   Social Drivers of Health   Financial Resource Strain: Medium Risk (04/13/2021)   Overall Financial Resource Strain (CARDIA)    Difficulty of Paying Living Expenses: Somewhat hard  Food Insecurity: Not on file  Transportation Needs: Not on file  Physical Activity: Not on file  Stress: Not on file  Social Connections: Not on file     Family History:  The patient's family history includes Cancer in her father and mother; Cancer (age of onset: 65) in her sister; Lung cancer in her sister and sister.  ROS:   12-point review of systems is negative unless otherwise noted in the  HPI.   EKGs/Labs/Other Studies Reviewed:    Studies reviewed were  summarized above. The additional studies were reviewed today:  Zio patch 05/2022: Atrial Fibrillation occurred continuously (100% burden), ranging from 42-189 bpm (avg of 91 bpm).  Rare PVCs. __________   2D echo 05/13/2022: 1. Left ventricular ejection fraction, by estimation, is 60 to 65%. The  left ventricle has normal function. The left ventricle has no regional  wall motion abnormalities. There is mild left ventricular hypertrophy.  Left ventricular diastolic parameters  are indeterminate.   2. Right ventricular systolic function is normal. The right ventricular  size is normal. There is mildly elevated pulmonary artery systolic  pressure. The estimated right ventricular systolic pressure is 37.7 mmHg.   3. Left atrial size was moderately dilated.   4. The mitral valve is normal in structure. Mild mitral valve  regurgitation. No evidence of mitral stenosis.   5. The aortic valve is normal in structure. Aortic valve regurgitation is  mild. Moderate aortic valve stenosis. Aortic valve area, by VTI measures  1.20 cm. Aortic valve mean gradient measures 18.0 mmHg. Aortic valve Vmax  measures 2.68 m/s.   6. The inferior vena cava is normal in size with greater than 50%  respiratory variability, suggesting right atrial pressure of 3 mmHg.   Comparison(s): Previous Echo showed LV EF 60-65%, normal LV function, mild  to moderate AS, mean gradient 14 mmHg.  __________   2D echo 06/05/2019: 1. Left ventricular ejection fraction, by visual estimation, is 60 to  65%. The left ventricle has normal function. There is borderline left  ventricular hypertrophy.   2. Left ventricular diastolic parameters are consistent with Grade II  diastolic dysfunction (pseudonormalization).   3. The left ventricle has no regional wall motion abnormalities.   4. Global right ventricle has normal systolic function.The right  ventricular  size is normal. Right vetricular wall thickness was not  assessed.   5. Left atrial size was normal.   6. Right atrial size was normal.   7. The mitral valve is normal in structure. No evidence of mitral valve  regurgitation.   8. The tricuspid valve is normal in structure.   9. The tricuspid valve is normal in structure. Tricuspid valve  regurgitation is mild.  10. The aortic valve is abnormal. Aortic valve regurgitation is not  visualized. Mild to moderate aortic valve stenosis.  11. Peak AV gradient , MG , DVI 0.45, with LVOT diameter of  2cm, AVA 1.4cm2.  12. The pulmonic valve was not well visualized. Pulmonic valve  regurgitation is not visualized.  13. Mildly elevated pulmonary artery systolic pressure.  14. The inferior vena cava is normal in size with greater than 50%  respiratory variability, suggesting right atrial pressure of 3 mmHg.  __________   [place] patch 05/2019: Normal sinus rhythm with an average heart rate of 65 bpm. Sinus bradycardia during early morning hours with lowest heart rate of 43 bpm. Frequent episodes of SVT with a total of 100 runs.  The longest run lasted 50 minutes and 54 seconds. Intermittent Mobitz 1 second-degree AV block and early morning hours. Most triggered events correlated with SVT.   EKG:  EKG is not ordered today.  Patient declined.    Recent Labs: 07/26/2022: B Natriuretic Peptide 498.8 03/10/2023: ALT 40; TSH 3.70 03/29/2023: BUN 14; Creatinine, Ser 1.20; Hemoglobin 14.6; Platelets 220.0; Potassium 3.7; Sodium 140  Recent Lipid Panel    Component Value Date/Time   CHOL 137 07/12/2021 1400   TRIG 92.0 07/12/2021 1400   HDL 44.70 07/12/2021 1400   CHOLHDL 3  07/12/2021 1400   VLDL 18.4 07/12/2021 1400   LDLCALC 74 07/12/2021 1400    PHYSICAL EXAM:    VS:  BP (!) 139/100 (BP Location: Left Arm, Patient Position: Sitting, Cuff Size: Normal)   Pulse 99   Ht 5\' 5"  (1.651 m)   Wt 140 lb 12.8 oz (63.9 kg)   SpO2 96%    BMI 23.43 kg/m   BMI: Body mass index is 23.43 kg/m.  Physical Exam Vitals reviewed.  Constitutional:      Appearance: She is well-developed.  HENT:     Head: Normocephalic and atraumatic.  Eyes:     General:        Right eye: No discharge.        Left eye: No discharge.  Cardiovascular:     Rate and Rhythm: Normal rate. Rhythm irregularly irregular.     Heart sounds: S1 normal and S2 normal. Heart sounds not distant. No midsystolic click and no opening snap. Murmur heard.     Systolic murmur is present with a grade of 2/6 at the upper right sternal border.     No friction rub.  Pulmonary:     Effort: Pulmonary effort is normal. No respiratory distress.     Breath sounds: Normal breath sounds. No decreased breath sounds, wheezing, rhonchi or rales.  Chest:     Chest wall: No tenderness.  Musculoskeletal:     Cervical back: Normal range of motion.     Right lower leg: No edema.     Left lower leg: No edema.  Skin:    General: Skin is warm and dry.     Nails: There is no clubbing.     Comments: Mildly erythematous and raised lesion along the left forearm.  Thickened left great toenail.  Neurological:     Mental Status: She is alert and oriented to person, place, and time.  Psychiatric:        Speech: Speech normal.        Behavior: Behavior normal.        Thought Content: Thought content normal.        Judgment: Judgment normal.     Wt Readings from Last 3 Encounters:  06/15/23 140 lb 12.8 oz (63.9 kg)  05/18/23 145 lb 12.8 oz (66.1 kg)  04/07/23 143 lb 3.2 oz (65 kg)     ASSESSMENT & PLAN:   Persistent A-fib/flutter: She remains in rate controlled A-fib by physical exam.  Declined EKG.  Continue Cardizem CD 360 mg daily.  Suspect this is contributing to some of her mild bilateral ankle swelling.  She declines changes to pharmacotherapy.  CHA2DS2-VASc at least 4.  Remains on apixaban 5 mg twice daily and does not meet reduced dosing criteria.  She does continue to miss  an occasional dose of apixaban and declines transition to alternative anticoagulation.  However, she is concerned about the cost of apixaban.  She will notify us if this continues to be an issue and we we will have her reach out to her insurance company to determine cost of alternative anticoagulation.  She indicates that she would not transition to warfarin.  Has declined to DCCV.  Pursue rate control at her request.  HFpEF: Euvolemic and well compensated.  Remains on as needed furosemide.  Defer escalation of pharmacotherapy at this time in an effort to minimize venipunctures and at patient request to minimize office visits.  Coronary artery calcification: No symptoms suggestive of angina or cardiac decompensation. Has declined ischemic testing  in the past. Remains on apixaban denies of aspirin given underlying A-fib/flutter. Continue atorvastatin and risk factor modification including recommendation for complete smoking cessation.   Aortic stenosis: Moderate by most recent echo.  Asymptomatic.  Schedule echo.  She prefers for this to be in the warmer months.  She declined to stay in the office for scheduling.  History of second-degree AV block type I: Noted during nocturnal hours.  Declines sleep study.  No indication for PPM at this time.  HTN: Blood pressure is mildly elevated in the office today.  Previously did not tolerate titration of losartan with reported recent nocturia.  For now remains on Cardizem CD 360 mg and losartan 50 mg.  Low-sodium diet is recommended.  HLD: LDL 74.  Remains on atorvastatin 80 mg.  Followed by PCP.  Carotid artery disease: Status post right-sided CEA.  Most recent carotid artery ultrasound from 10/2022 showed bilateral 1 to 39% ICA stenoses.  She is on apixaban in place of aspirin and atorvastatin as outlined above.  Followed by vascular surgery.   Ectatic thoracic/abdominal aorta: Upper limits of normal ascending thoracic aorta on CT imaging in 07/2021.  Abdominal  aorta was largely stable on imaging in 2024.  Followed by vascular surgery with ongoing management deferred to them.  COPD with ongoing tobacco use: No acute exacerbation.  Complete smoking cessation is recommended.  She again states "cigarettes will be with me until the day I die."  Diminished lower extremity pulses: Concern for PAD with ongoing tobacco use.  Recommend ABI and lower extremity arterial ultrasound.  Patient declined to stay in the office for scheduling.  Skin lesion/thickened toenail: Patient advised to follow-up with PCP and/or dermatology.    Disposition: F/u with Dr. Kirke Corin or an APP in 5-6 months.   Medication Adjustments/Labs and Tests Ordered: Current medicines are reviewed at length with the patient today.  Concerns regarding medicines are outlined above. Medication changes, Labs and Tests ordered today are summarized above and listed in the Patient Instructions accessible in Encounters.   Signed, Eula Listen, PA-C 06/15/2023 12:31 PM     [facility] [place] [address] [place], [place] [address] [phone]

## 2023-06-15 ENCOUNTER — Encounter: Payer: Self-pay | Admitting: Physician Assistant

## 2023-06-15 ENCOUNTER — Ambulatory Visit: Payer: Medicare Other | Attending: Physician Assistant | Admitting: Physician Assistant

## 2023-06-15 VITALS — BP 139/100 | HR 99 | Ht 65.0 in | Wt 140.8 lb

## 2023-06-15 DIAGNOSIS — R0989 Other specified symptoms and signs involving the circulatory and respiratory systems: Secondary | ICD-10-CM

## 2023-06-15 DIAGNOSIS — J449 Chronic obstructive pulmonary disease, unspecified: Secondary | ICD-10-CM | POA: Diagnosis not present

## 2023-06-15 DIAGNOSIS — I10 Essential (primary) hypertension: Secondary | ICD-10-CM

## 2023-06-15 DIAGNOSIS — E785 Hyperlipidemia, unspecified: Secondary | ICD-10-CM | POA: Diagnosis not present

## 2023-06-15 DIAGNOSIS — I5032 Chronic diastolic (congestive) heart failure: Secondary | ICD-10-CM

## 2023-06-15 DIAGNOSIS — I4819 Other persistent atrial fibrillation: Secondary | ICD-10-CM | POA: Diagnosis not present

## 2023-06-15 DIAGNOSIS — I77819 Aortic ectasia, unspecified site: Secondary | ICD-10-CM

## 2023-06-15 DIAGNOSIS — I350 Nonrheumatic aortic (valve) stenosis: Secondary | ICD-10-CM

## 2023-06-15 DIAGNOSIS — I2510 Atherosclerotic heart disease of native coronary artery without angina pectoris: Secondary | ICD-10-CM | POA: Diagnosis not present

## 2023-06-15 DIAGNOSIS — I6523 Occlusion and stenosis of bilateral carotid arteries: Secondary | ICD-10-CM | POA: Diagnosis not present

## 2023-06-15 DIAGNOSIS — Z720 Tobacco use: Secondary | ICD-10-CM | POA: Diagnosis not present

## 2023-06-15 DIAGNOSIS — L989 Disorder of the skin and subcutaneous tissue, unspecified: Secondary | ICD-10-CM

## 2023-06-15 DIAGNOSIS — Z8679 Personal history of other diseases of the circulatory system: Secondary | ICD-10-CM | POA: Diagnosis not present

## 2023-06-15 MED ORDER — APIXABAN 5 MG PO TABS: 5.0000 mg | ORAL_TABLET | Freq: Two times a day (BID) | ORAL | Status: DC

## 2023-06-15 NOTE — Patient Instructions (Addendum)
Medication Instructions:  Eliquis samples given: take eliquis 5 mg tablet twice daily *If you need a refill on your cardiac medications before your next appointment, please call your pharmacy*   Lab Work: None ordered at this time  If you have labs (blood work) drawn today and your tests are completely normal, you will receive your results only by: MyChart Message (if you have MyChart) OR A paper copy in the mail If you have any lab test that is abnormal or we need to change your treatment, we will call you to review the results.   Testing/Procedures: Your physician has requested that you have a lower extremity arterial duplex. During this test, ultrasound is used to evaluate arterial blood flow in the legs. Allow one hour for this exam. There are no restrictions or special instructions. This will take place at [address] [address], [place] [address]   Your physician has requested that you have an ankle brachial index (ABI). During this test an ultrasound and blood pressure cuff are used to evaluate the arteries that supply the arms and legs with blood.  Allow thirty minutes for this exam.  There are no restrictions or special instructions.  This will take place at [address] ([facility]) [phone], [place] [address]  Your physician has requested that you have an echocardiogram. Echocardiography is a painless test that uses sound waves to create images of your heart. It provides your doctor with information about the size and shape of your heart and how well your heart's chambers and valves are working.   You may receive an ultrasound enhancing agent through an IV if needed to better visualize your heart during the echo. This procedure takes approximately one hour.  There are no restrictions for this procedure.  This will take place at [address] [facility], [place] [address]  Please note: We ask at that you not bring  children with you during ultrasound (echo/ vascular) testing. Due to room size and safety concerns, children are not allowed in the ultrasound rooms during exams. Our front office staff cannot provide observation of children in our lobby area while testing is being conducted. An adult accompanying a patient to their appointment will only be allowed in the ultrasound room at the discretion of the ultrasound technician under special circumstances. We apologize for any inconvenience.   Follow-Up: At [facility], you and your health needs are our priority.  As part of our continuing mission to provide you with exceptional heart care, we have created designated Provider Care Teams.  These Care Teams include your primary Cardiologist (physician) and Advanced Practice Providers (APPs -  Physician Assistants and Nurse Practitioners) who all work together to provide you with the care you need, when you need it.  We recommend signing up for the patient portal called "MyChart".  Sign up information is provided on this After Visit Summary.  MyChart is used to connect with patients for Virtual Visits (Telemedicine).  Patients are able to view lab/test results, encounter notes, upcoming appointments, etc.  Non-urgent messages can be sent to your provider as well.   To learn more about what you can do with MyChart, go to [email or web address].    Your next appointment:   5 month(s)  Provider:   You may see Lorine Bears, MD or one of the following Advanced Practice Providers on your designated Care Team:   Nicolasa Ducking, NP Eula Listen, PA-C Cadence Fransico Michael, PA-C [employer]  Hammock, NP Carlos Levering, NP

## 2023-07-01 ENCOUNTER — Other Ambulatory Visit: Payer: Self-pay | Admitting: Physician Assistant

## 2023-07-02 ENCOUNTER — Encounter: Payer: Self-pay | Admitting: Family

## 2023-07-02 ENCOUNTER — Other Ambulatory Visit: Payer: Self-pay | Admitting: Family

## 2023-07-03 ENCOUNTER — Other Ambulatory Visit: Payer: Self-pay

## 2023-07-03 MED ORDER — ATORVASTATIN CALCIUM 80 MG PO TABS: 80.0000 mg | ORAL_TABLET | Freq: Every day | ORAL | 2 refills | Status: DC

## 2023-07-12 ENCOUNTER — Telehealth: Payer: Self-pay

## 2023-07-12 NOTE — Telephone Encounter (Signed)
 PAP: Patient assistance application for Stiol[employer] has been approved by PAP Companies: [employer] from 07/07/2023 to 05/01/2024. Medication should be delivered to PAP Delivery: Home. For further shipping updates, please contact [employer]) at [phone]. Patient ID is: not provided

## 2023-07-28 ENCOUNTER — Other Ambulatory Visit: Payer: Self-pay | Admitting: Family

## 2023-07-28 ENCOUNTER — Encounter: Payer: Self-pay | Admitting: Family

## 2023-07-28 DIAGNOSIS — I10 Essential (primary) hypertension: Secondary | ICD-10-CM

## 2023-07-28 DIAGNOSIS — I4811 Longstanding persistent atrial fibrillation: Secondary | ICD-10-CM

## 2023-08-01 DIAGNOSIS — I7121 Aneurysm of the ascending aorta, without rupture: Secondary | ICD-10-CM

## 2023-08-01 HISTORY — DX: Aneurysm of the ascending aorta, without rupture: I71.21

## 2023-08-30 ENCOUNTER — Ambulatory Visit
Admission: RE | Admit: 2023-08-30 | Discharge: 2023-08-30 | Disposition: A | Discharge status: Home / Self Care | Source: Ambulatory Visit | Attending: Acute Care | Admitting: Acute Care

## 2023-08-30 DIAGNOSIS — F17210 Nicotine dependence, cigarettes, uncomplicated: Secondary | ICD-10-CM | POA: Insufficient documentation

## 2023-08-30 DIAGNOSIS — Z87891 Personal history of nicotine dependence: Secondary | ICD-10-CM | POA: Diagnosis not present

## 2023-09-04 ENCOUNTER — Other Ambulatory Visit (HOSPITAL_BASED_OUTPATIENT_CLINIC_OR_DEPARTMENT_OTHER): Payer: Self-pay | Admitting: Physician Assistant

## 2023-09-04 DIAGNOSIS — I739 Peripheral vascular disease, unspecified: Secondary | ICD-10-CM

## 2023-09-19 ENCOUNTER — Encounter (INDEPENDENT_AMBULATORY_CARE_PROVIDER_SITE_OTHER): Payer: Self-pay

## 2023-09-20 ENCOUNTER — Ambulatory Visit: Attending: Physician Assistant

## 2023-09-20 DIAGNOSIS — R0989 Other specified symptoms and signs involving the circulatory and respiratory systems: Secondary | ICD-10-CM | POA: Diagnosis not present

## 2023-09-20 DIAGNOSIS — I739 Peripheral vascular disease, unspecified: Secondary | ICD-10-CM | POA: Diagnosis not present

## 2023-09-21 ENCOUNTER — Ambulatory Visit: Payer: Self-pay | Admitting: Physician Assistant

## 2023-09-21 LAB — VAS US ABI WITH/WO TBI
Left ABI: 0.83
Right ABI: 0.82

## 2023-09-22 NOTE — Progress Notes (Unsigned)
 Cardiology Office Note    Date:  09/27/2023   ID:  Tracy Bell 1951-02-11, MRN [id number]  PCP:  Tracy Catching, FNP  Cardiologist:  Tracy Kirks, MD  Electrophysiologist:  None   Chief Complaint: Follow-up  History of Present Illness:   Tracy Bell is a 73 y.o. 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   She was previously followed by Dr. Parks Bell with CT of the lungs showing evidence of coronary calcification.  Stress echo in 12/2015 showed normal LV systolic function with mild aortic stenosis and no evidence of ischemia with stress.  She had palpitations in 2019 with a 72-hour monitor showing sinus rhythm with frequent PACs and occasional brief atrial runs lasting up to 7 beats.  Echo from 04/2018 showed normal LV systolic function with mild to moderate aortic stenosis with a mean gradient of 13 mmHg and a valve area of 1.16 cm.   She transitioned her care to Dr. Alvenia Bell in 05/2019 for evaluation of tachypalpitations.  Zio patch at that time showed sinus rhythm with an average rate of 65 bpm, sinus bradycardia during the early morning hours with the lowest heart rate of 43 bpm, frequent episodes of SVT totaling 100 runs with the longest episode lasting 15 minutes and 54 seconds, and intermittent Mobitz type I second-degree AV block in the early morning hours.  Echo in 2021 showed an EF of 60 to 65%, borderline LVH, grade 2 diastolic dysfunction, no regional wall motion abnormalities, normal RV systolic function, mild tricuspid regurgitation, mild to  moderate aortic stenosis with a mean gradient of 14 mmHg and a valve area of 1.4 cm.  She was diagnosed with atrial flutter in 12/2019 and initiated on apixaban .  She was noted to have converted to sinus rhythm with titration of diltiazem  in follow-up in 01/2020.    She was evaluated by her PCP on 05/04/2022 for dizziness.  She was found to be in rate controlled A-fib at that time.  Given this finding, she followed up in our office on 05/05/2022 with continued dizziness that she indicated dated back to mid December 2023.  She reported sometimes forgetting to take her second dose of apixaban .  Given missed doses of apixaban , MRI of the brain was ordered and remains pending at this time.  To quantify A-fib burden, she underwent Zio patch which showed a 100% A-fib burden with an average ventricular rate of 91 bpm with a range of 42 to 189 bpm.  Echo from 05/2022 showed an EF of 60 to 65%, no regional wall motion abnormalities, mild LVH, normal RV systolic function and ventricular cavity size, mildly elevated PASP estimated at 37.7 mmHg, moderately dilated left atrium, mild mitral regurgitation, moderate aortic valve stenosis with a mean gradient of 18 mmHg and a valve area of 1.2 cm, and an estimated right atrial pressure of 3 mmHg.  She was seen in the office in 06/2022 noting some improvement in her dizziness.  She remained in rate controlled A-fib.  She did continue to miss evening doses of apixaban  and was not interested in transitioning to rivaroxaban.  She was seen  in the office on 07/26/2022 and had been more compliant with her apixaban , though still reported missing one dose every 1 to 2 weeks.  She was felt to be volume up with a weight that was up 5 pounds when compared to prior visit.  BNP 498 at that time.  She was advised to take Lasix  20 mg daily for 3 days followed by as needed thereafter.  She was seen in the office in 08/2022 and continued to decline rhythm control strategy.  She preferred to not transition  off diltiazem .  She continued to smoke and was not interested in quitting.  Carotid artery ultrasound from 10/2022 showed 1 to 39% bilateral ICA stenoses with bilateral antegrade flow of the vertebral arteries and normal flow hemodynamics in the bilateral subclavian arteries.  Most recent AAA ultrasound from 10/2022 showed mild dilatation of the distal aorta proximal aorta measuring up to 3.19 cm and not aneurysmal.  She was seen in 04/2023 and reported intermittent missing of her evening dose of apixaban  1-2 times per month and declined transition to alternative anticoagulation.  She again declined rhythm control strategy.  She continued to smoke and was not interested in quitting.  Her weight was down 20 pounds when compared to her visit in 08/2022.  She indicated this was in the setting of thrush with diminished oral intake.  She was last seen in the office in 06/2023 and continued to miss doses of apixaban  intermittently.  She continued to smoke.  She was largely sedentary and had several noncardiac complaints with recommendation to follow-up with PCP for these issues.  To evaluate for PAD she underwent ABI in 08/2023, that showed mild bilateral lower extremity PAD with a right ABI of 0.82 and left ABI 0.83 (previously 1.14 bilaterally).  Lower extremity arterial ultrasound at that time showed heavy calcification throughout the right lower extremity without focal stenosis with left lower extremity showing 75 to 99% stenosis in the left SFA with heavy calcification throughout the left lower extremity.  Findings are also suggestive of bilateral inflow disease.  She was referred to Dr. Alvenia Bell for PVD evaluation.  Lung cancer screening CT performed in 08/2023, resulted in 08/2023, showed a largely stable ascending thoracic aortic aneurysm measuring 4.3 cm (previously 4.2 cm in 08/2022).  She comes in today and is doing well from a cardiac perspective, without symptoms of angina or cardiac decompensation.  No progressive  dyspnea, lower extremity swelling, or orthopnea.  Taking furosemide  4 days/week.  She did have 1 mechanical fall since she was last seen, the right knee gave out and she slipped down a step.  She did hit her head and was without LOC.  She did not seek medical attention at that time and declines CT head at this time.  She is aware of risks.  She is without symptoms of melena or hematochezia.  She is more adherent to apixaban .  Not interested in pursuing rhythm control strategy.  Continues to note bilateral lower extremity claudication with the left lower extremity being worse than the right.  Continues to smoke and is not interested in quitting at this time.  Continues to note longstanding and intermittent dizziness that is noticed when rotating her head from the left to right.  Declines EKG and labs today.   Labs independently reviewed: 03/2023 - potassium 3.7, BUN 14, serum creatinine 1.2, Hgb 14.6, PLT 220, TSH normal, albumin 0.0, AST normal, ALT 40 06/2021 - TC 137, TG 92, HDL 44, LDL 74  08/2020 -  A1c 5.8  Past Medical History:  Diagnosis Date   Anxiety    COPD (chronic obstructive pulmonary disease) (HCC)    Cough    Depression    Dizziness    Dyspnea    Heart murmur    Hyperlipidemia    Hypertension    Lightheadedness    Personal history of tobacco use, presenting hazards to health 08/28/2015    Past Surgical History:  Procedure Laterality Date   ABDOMINAL HYSTERECTOMY  1980   complete   BREAST BIOPSY  1970   normal   DILATION AND CURETTAGE OF UTERUS     ENDARTERECTOMY Right 07/12/2018   Procedure: ENDARTERECTOMY CAROTID;  Surgeon: Celso College, MD;  Location: [facility] ORS;  Service: Vascular;  Laterality: Right;   TUBAL LIGATION      Current Medications: Current Meds  Medication Sig   apixaban  (ELIQUIS ) 5 MG TABS tablet Take 1 tablet (5 mg total) by mouth 2 (two) times daily.   atorvastatin  (LIPITOR) 80 MG tablet Take 1 tablet (80 mg total) by mouth daily.   Biotin 5000 MCG  CAPS Take by mouth.   cholecalciferol  (VITAMIN D3) 25 MCG (1000 UT) tablet Take 1,000 Units by mouth daily.   diltiazem  (CARDIZEM  CD) 360 MG 24 hr capsule TAKE 1 CAPSULE BY MOUTH EVERY DAY   furosemide  (LASIX ) 20 MG tablet TAKE 1 TABLET (20 MG TOTAL) BY MOUTH DAILY AS NEEDED FOR EDEMA (TAKE AS NEEDED FOR SWELLING OR WEIGHT GAIN GREATER THAN 3 POUNDS OVERNIGHT). TAKE FUROSEMIDE  20 MG ONCE DAILY FOR 3 DAYS THEN AS NEEDED   losartan  (COZAAR ) 50 MG tablet TAKE 1 TABLET BY MOUTH EVERY DAY   Multiple Vitamins-Minerals (CENTRUM ADULTS) TABS Take 1 tablet by mouth daily.   Tiotropium Bromide-Olodaterol (STIOLTO RESPIMAT ) 2.5-2.5 MCG/ACT AERS Inhale 2 puffs into the lungs daily.    Allergies:   Patient has no known allergies.   Social History   Socioeconomic History   Marital status: Widowed    Spouse name: Not on file   Number of children: Not on file   Years of education: Not on file   Highest education level: Not on file  Occupational History   Occupation: retired  Tobacco Use   Smoking status: Every Day    Current packs/day: 2.00    Average packs/day: 2.0 packs/day for 60.6 years (121.2 ttl pk-yrs)    Types: Cigarettes    Start date: 05/03/1963   Smokeless tobacco: Never   Tobacco comments:    10-12 cigarettes a day- khj 05/18/2023  Vaping Use   Vaping status: Never Used  Substance and Sexual Activity   Alcohol use: Yes    Alcohol/week: 0.0 standard drinks of alcohol    Comment: [place] occasionally   Drug use: No   Sexual activity: Not on file  Other Topics Concern   Not on file  Social History Narrative   Lives with husband. Has 11 pets in home. Has one son, lives locally.   Was born and raised in [place].   Husband is older than her and she provides care for him.      Work - retired. Invented water bottle for pets   Social Drivers of Health   Financial Resource Strain: Medium Risk (04/13/2021)   Overall Financial Resource Strain (CARDIA)    Difficulty of Paying  Living Expenses: Somewhat hard  Food Insecurity: Not on file  Transportation Needs: Not on file  Physical Activity: Not on file  Stress: Not on file  Social Connections:  Not on file     Family History:  The patient's family history includes Cancer in her father and mother; Cancer (age of onset: 22) in her sister; Lung cancer in her sister and sister.  ROS:   12-point review of systems is negative unless otherwise noted in the HPI.   EKGs/Labs/Other Studies Reviewed:    Studies reviewed were summarized above. The additional studies were reviewed today:  LEA ultrasound 09/20/2023: Summary:  Right: Heavy calcification throughout right leg without focal stenosis.  Abnormal CFA waveforms suggest inflow disease.   Left: 75-99% stenosis noted in the superficial femoral artery. Heavy  calcificaton throughout the left leg with focal stenosis seen in the left  mid SFA. Abnormal CFA waveforms suggest inflow disease.  __________  ABI 09/20/2023: ABI/TBIToday's ABIToday's TBIPrevious ABIPrevious TBI  +-------+-----------+-----------+------------+------------+  Right 0.82       0.49       1.14        0.64          +-------+-----------+-----------+------------+------------+  Left  0.83       0.43       1.14        0.61          +-------+-----------+-----------+------------+------------+    Bilateral TBIs and ABIs appear decreased.   See Arterial Duplex study    Summary:  Right: Resting right ankle-brachial index indicates mild right lower  extremity arterial disease. The right toe-brachial index is abnormal.   Left: Resting left ankle-brachial index indicates mild left lower  extremity arterial disease. The left toe-brachial index is abnormal.  __________  2D echo 05/16/2023: 1. Left ventricular ejection fraction, by estimation, is 60 to 65%. The  left ventricle has normal function. The left ventricle has no regional  wall motion abnormalities. There is mild left  ventricular hypertrophy.  Left ventricular diastolic parameters  are indeterminate.   2. Right ventricular systolic function is normal. The right ventricular  size is normal. There is mildly elevated pulmonary artery systolic  pressure. The estimated right ventricular systolic pressure is 41.1 mmHg.   3. Left atrial size was moderately dilated.   4. Right atrial size was moderately dilated.   5. The mitral valve is normal in structure. Mild to moderate mitral valve  regurgitation. No evidence of mitral stenosis.   6. Tricuspid valve regurgitation is mild to moderate.   7. The aortic valve is calcified. There is moderate calcification of the  aortic valve. Aortic valve regurgitation is mild. Moderate aortic valve  stenosis. Aortic valve area, by VTI measures 0.85 cm. Aortic valve mean  gradient measures 19.0 mmHg.   8. There is mild dilatation of the ascending aorta, measuring 40 mm.   9. The inferior vena cava is dilated in size with >50% respiratory  variability, suggesting right atrial pressure of 8 mmHg.  __________  Zio patch 05/2022: Atrial Fibrillation occurred continuously (100% burden), ranging from 42-189 bpm (avg of 91 bpm).  Rare PVCs. __________   2D echo 05/13/2022: 1. Left ventricular ejection fraction, by estimation, is 60 to 65%. The  left ventricle has normal function. The left ventricle has no regional  wall motion abnormalities. There is mild left ventricular hypertrophy.  Left ventricular diastolic parameters  are indeterminate.   2. Right ventricular systolic function is normal. The right ventricular  size is normal. There is mildly elevated pulmonary artery systolic  pressure. The estimated right ventricular systolic pressure is 37.7 mmHg.   3. Left atrial size was moderately dilated.   4.  The mitral valve is normal in structure. Mild mitral valve  regurgitation. No evidence of mitral stenosis.   5. The aortic valve is normal in structure. Aortic valve  regurgitation is  mild. Moderate aortic valve stenosis. Aortic valve area, by VTI measures  1.20 cm. Aortic valve mean gradient measures 18.0 mmHg. Aortic valve Vmax  measures 2.68 m/s.   6. The inferior vena cava is normal in size with greater than 50%  respiratory variability, suggesting right atrial pressure of 3 mmHg.   Comparison(s): Previous Echo showed LV EF 60-65%, normal LV function, mild  to moderate AS, mean gradient 14 mmHg.  __________   2D echo 06/05/2019: 1. Left ventricular ejection fraction, by visual estimation, is 60 to  65%. The left ventricle has normal function. There is borderline left  ventricular hypertrophy.   2. Left ventricular diastolic parameters are consistent with Grade II  diastolic dysfunction (pseudonormalization).   3. The left ventricle has no regional wall motion abnormalities.   4. Global right ventricle has normal systolic function.The right  ventricular size is normal. Right vetricular wall thickness was not  assessed.   5. Left atrial size was normal.   6. Right atrial size was normal.   7. The mitral valve is normal in structure. No evidence of mitral valve  regurgitation.   8. The tricuspid valve is normal in structure.   9. The tricuspid valve is normal in structure. Tricuspid valve  regurgitation is mild.  10. The aortic valve is abnormal. Aortic valve regurgitation is not  visualized. Mild to moderate aortic valve stenosis.  11. Peak AV gradient , MG , DVI 0.45, with LVOT diameter of  2cm, AVA 1.4cm2.  12. The pulmonic valve was not well visualized. Pulmonic valve  regurgitation is not visualized.  13. Mildly elevated pulmonary artery systolic pressure.  14. The inferior vena cava is normal in size with greater than 50%  respiratory variability, suggesting right atrial pressure of 3 mmHg.  __________   Zio patch 05/2019: Normal sinus rhythm with an average heart rate of 65 bpm. Sinus bradycardia during early morning  hours with lowest heart rate of 43 bpm. Frequent episodes of SVT with a total of 100 runs.  The longest run lasted 50 minutes and 54 seconds. Intermittent Mobitz 1 second-degree AV block and early morning hours. Most triggered events correlated with SVT.   EKG:  EKG is not ordered today, declined by patient.    Recent Labs: 03/10/2023: ALT 40; TSH 3.70 03/29/2023: BUN 14; Creatinine, Ser 1.20; Hemoglobin 14.6; Platelets 220.0; Potassium 3.7; Sodium 140  Recent Lipid Panel    Component Value Date/Time   CHOL 137 07/12/2021 1400   TRIG 92.0 07/12/2021 1400   HDL 44.70 07/12/2021 1400   CHOLHDL 3 07/12/2021 1400   VLDL 18.4 07/12/2021 1400   LDLCALC 74 07/12/2021 1400    PHYSICAL EXAM:    VS:  BP (!) 158/94   Pulse 91   Ht 5\' 6"  (1.676 m)   Wt 139 lb 12.8 oz (63.4 kg)   SpO2 92%   BMI 22.56 kg/m   BMI: Body mass index is 22.56 kg/m.  Physical Exam Vitals reviewed.  Constitutional:      Appearance: She is well-developed.  HENT:     Head: Normocephalic and atraumatic.  Eyes:     General:        Right eye: No discharge.        Left eye: No discharge.  Cardiovascular:     Rate  and Rhythm: Normal rate. Rhythm irregularly irregular.     Heart sounds: S1 normal and S2 normal. Heart sounds not distant. No midsystolic click and no opening snap. Murmur heard.     Systolic murmur is present with a grade of 2/6 at the upper right sternal border.     No friction rub.  Pulmonary:     Effort: Pulmonary effort is normal. No respiratory distress.     Breath sounds: Normal breath sounds. No decreased breath sounds, wheezing, rhonchi or rales.  Chest:     Chest wall: No tenderness.  Musculoskeletal:     Cervical back: Normal range of motion.     Right lower leg: No edema.     Left lower leg: No edema.  Skin:    General: Skin is warm and dry.     Nails: There is no clubbing.  Neurological:     Mental Status: She is alert and oriented to person, place, and time.  Psychiatric:         Speech: Speech normal.        Behavior: Behavior normal.        Thought Content: Thought content normal.        Judgment: Judgment normal.     Wt Readings from Last 3 Encounters:  09/27/23 139 lb 12.8 oz (63.4 kg)  08/30/23 140 lb (63.5 kg)  06/15/23 140 lb 12.8 oz (63.9 kg)     ASSESSMENT & PLAN:   Persistent A-fib/flutter: She remains in rate controlled A-fib by physical exam with patient declining EKG.  She remains on Cardizem  CD 360 mg daily.  CHA2DS2-VASc at least 4.  She remains on apixaban  5 mg twice daily and does not meet reduced dosing criteria.  She is now more adherent to twice daily dosing anticoagulation and has historically declined to transition to alternative anticoagulation for ease of dosing and more adherence.  Has declined rhythm control strategy, pursue rate control at her request.  HFpEF: Euvolemic and well compensated.  Weight stable.  Remains on as needed furosemide , currently taking this 4 days/week.  She prefers to defer escalation of pharmacotherapy in an effort to minimize venipunctures and office visit.  She declines labs today.  Coronary artery calcification: No symptoms of angina or cardiac decompensation.  Has declined ischemic testing in the past.  Remains on apixaban  in place of aspirin  given underlying A-fib/flutter.  She remains on atorvastatin .  Continue risk factor modification and recommendation for complete smoking cessation.  Aortic stenosis: Moderate aortic stenosis with a mean gradient of 19 mmHg and a valve area of 0.85 by echo in 05/2023.  Repeat echo in 05/2024.  History of second-degree AV block type I: Noted during nocturnal hours.  Declines sleep study.  No indication for PPM at this time.  PAD with claudication: Bilateral ABIs with evidence of mild PAD earlier this month with lower extremity arterial ultrasound with heavy calcification of the bilateral lower extremities and suggestion of significant left SFA stenosis.  Has been referred  to Dr. Alvenia Bell for PVD evaluation.  Currently on apixaban  in place of aspirin  given A-fib.  Remains on atorvastatin .  Complete smoking cessation is recommended.  Needs to initiate walking regimen.  HTN: Blood pressure is elevated in the office today.  She declines escalation in pharmacotherapy.  She remains on Cardizem  CD 360 mg and losartan  50 mg daily.  Has previously reported intolerance to higher doses of losartan .  HLD: LDL 74.  She remains on atorvastatin  80 mg.  Ascending thoracic  aneurysm: Most recent CT of the chest, performed for lung cancer screening, 08/30/2023 demonstrated a largely stable ascending thoracic aorta measuring 4.3 cm (previously measuring 4.2 cm in 08/2022).  Annual follow-up recommended lung cancer screening CT program.  Lung cancer screening is not performed, annual CTA or MRA is recommended.  Ectatic abdominal aorta: Abdominal aorta was largely stable on imaging in 2024 and is followed by vascular surgery with ongoing management deferred to them.  Carotid artery disease: Carotid artery ultrasound in 10/2022 showed 1 to 39% bilateral ICA stenosis with widely patent CEA of the right carotid artery.  Remains on apixaban  in place of aspirin  given underlying persistent A-fib/flutter as well as atorvastatin  as outlined above.  Followed by vascular surgery.  COPD with ongoing tobacco use: Without acute exacerbation.  Complete smoking cessation is recommended.  She has reported "cigarettes will be with me until the day I die."  She is aware of the risks of continued smoking.      Disposition: F/u with Dr. Alvenia Bell or an APP in 6 to 7 months.   Medication Adjustments/Labs and Tests Ordered: Current medicines are reviewed at length with the patient today.  Concerns regarding medicines are outlined above. Medication changes, Labs and Tests ordered today are summarized above and listed in the Patient Instructions accessible in Encounters.   Signed, Varney Gentleman, PA-C 09/27/2023 3:05 PM      [facility] [place] [address] [place], [place] [address] [phone]

## 2023-09-26 ENCOUNTER — Other Ambulatory Visit: Payer: Self-pay | Admitting: Acute Care

## 2023-09-26 DIAGNOSIS — Z122 Encounter for screening for malignant neoplasm of respiratory organs: Secondary | ICD-10-CM

## 2023-09-26 DIAGNOSIS — Z87891 Personal history of nicotine dependence: Secondary | ICD-10-CM

## 2023-09-26 DIAGNOSIS — F17210 Nicotine dependence, cigarettes, uncomplicated: Secondary | ICD-10-CM

## 2023-09-27 ENCOUNTER — Ambulatory Visit: Attending: Physician Assistant | Admitting: Physician Assistant

## 2023-09-27 ENCOUNTER — Encounter: Payer: Self-pay | Admitting: Physician Assistant

## 2023-09-27 VITALS — BP 158/94 | HR 91 | Ht 66.0 in | Wt 139.8 lb

## 2023-09-27 DIAGNOSIS — I350 Nonrheumatic aortic (valve) stenosis: Secondary | ICD-10-CM

## 2023-09-27 DIAGNOSIS — I6523 Occlusion and stenosis of bilateral carotid arteries: Secondary | ICD-10-CM

## 2023-09-27 DIAGNOSIS — I739 Peripheral vascular disease, unspecified: Secondary | ICD-10-CM | POA: Diagnosis not present

## 2023-09-27 DIAGNOSIS — E785 Hyperlipidemia, unspecified: Secondary | ICD-10-CM | POA: Diagnosis not present

## 2023-09-27 DIAGNOSIS — I77819 Aortic ectasia, unspecified site: Secondary | ICD-10-CM

## 2023-09-27 DIAGNOSIS — I10 Essential (primary) hypertension: Secondary | ICD-10-CM | POA: Diagnosis not present

## 2023-09-27 DIAGNOSIS — I4819 Other persistent atrial fibrillation: Secondary | ICD-10-CM

## 2023-09-27 DIAGNOSIS — Z720 Tobacco use: Secondary | ICD-10-CM

## 2023-09-27 DIAGNOSIS — I7121 Aneurysm of the ascending aorta, without rupture: Secondary | ICD-10-CM

## 2023-09-27 DIAGNOSIS — I2510 Atherosclerotic heart disease of native coronary artery without angina pectoris: Secondary | ICD-10-CM

## 2023-09-27 DIAGNOSIS — J449 Chronic obstructive pulmonary disease, unspecified: Secondary | ICD-10-CM | POA: Diagnosis not present

## 2023-09-27 DIAGNOSIS — Z8679 Personal history of other diseases of the circulatory system: Secondary | ICD-10-CM | POA: Diagnosis not present

## 2023-09-27 DIAGNOSIS — I5032 Chronic diastolic (congestive) heart failure: Secondary | ICD-10-CM | POA: Diagnosis not present

## 2023-09-27 NOTE — Patient Instructions (Signed)
 Medication Instructions:   Your physician recommends that you continue on your current medications as directed. Please refer to the Current Medication list given to you today.   *If you need a refill on your cardiac medications before your next appointment, please call your pharmacy*  Lab Work:  No labs ordered today   If you have labs (blood work) drawn today and your tests are completely normal, you will receive your results only by: MyChart Message (if you have MyChart) OR A paper copy in the mail If you have any lab test that is abnormal or we need to change your treatment, we will call you to review the results.  Testing/Procedures:  PLEASE SCHEDULE ECHO JANUARY 2026  Your physician has requested that you have an echocardiogram. Echocardiography is a painless test that uses sound waves to create images of your heart. It provides your doctor with information about the size and shape of your heart and how well your heart's chambers and valves are working.   You may receive an ultrasound enhancing agent through an IV if needed to better visualize your heart during the echo. This procedure takes approximately one hour.  There are no restrictions for this procedure.  This will take place at [address] [facility]) [address], [place] [address]  Please note: We ask at that you not bring children with you during ultrasound (echo/ vascular) testing. Due to room size and safety concerns, children are not allowed in the ultrasound rooms during exams. Our front office staff cannot provide observation of children in our lobby area while testing is being conducted. An adult accompanying a patient to their appointment will only be allowed in the ultrasound room at the discretion of the ultrasound technician under special circumstances. We apologize for any inconvenience.   Follow-Up: At [facility], you and your health needs are our priority.  As part of our continuing  mission to provide you with exceptional heart care, our providers are all part of one team.  This team includes your primary Cardiologist (physician) and Advanced Practice Providers or APPs (Physician Assistants and Nurse Practitioners) who all work together to provide you with the care you need, when you need it.  Your next appointment:    6-7  month(s)  Provider:   Varney Gentleman, PA-C  Other Instructions  Your physician recommends that you schedule a follow-up appointment with Dr. Alvenia Aus for [facility] Consult (first available)

## 2023-09-29 DIAGNOSIS — B880 Other acariasis: Secondary | ICD-10-CM | POA: Diagnosis not present

## 2023-09-29 DIAGNOSIS — M3501 Sicca syndrome with keratoconjunctivitis: Secondary | ICD-10-CM | POA: Diagnosis not present

## 2023-09-29 DIAGNOSIS — H25042 Posterior subcapsular polar age-related cataract, left eye: Secondary | ICD-10-CM | POA: Diagnosis not present

## 2023-09-29 DIAGNOSIS — H25041 Posterior subcapsular polar age-related cataract, right eye: Secondary | ICD-10-CM | POA: Diagnosis not present

## 2023-09-29 DIAGNOSIS — H2513 Age-related nuclear cataract, bilateral: Secondary | ICD-10-CM | POA: Diagnosis not present

## 2023-10-03 ENCOUNTER — Other Ambulatory Visit (INDEPENDENT_AMBULATORY_CARE_PROVIDER_SITE_OTHER): Payer: Self-pay | Admitting: Vascular Surgery

## 2023-10-03 DIAGNOSIS — I714 Abdominal aortic aneurysm, without rupture, unspecified: Secondary | ICD-10-CM

## 2023-10-03 DIAGNOSIS — I6523 Occlusion and stenosis of bilateral carotid arteries: Secondary | ICD-10-CM

## 2023-10-03 DIAGNOSIS — M79604 Pain in right leg: Secondary | ICD-10-CM

## 2023-10-03 DIAGNOSIS — I700 Atherosclerosis of aorta: Secondary | ICD-10-CM

## 2023-10-06 ENCOUNTER — Encounter (INDEPENDENT_AMBULATORY_CARE_PROVIDER_SITE_OTHER): Payer: Self-pay | Admitting: Nurse Practitioner

## 2023-10-06 ENCOUNTER — Ambulatory Visit (INDEPENDENT_AMBULATORY_CARE_PROVIDER_SITE_OTHER): Payer: Medicare Other

## 2023-10-06 ENCOUNTER — Ambulatory Visit (INDEPENDENT_AMBULATORY_CARE_PROVIDER_SITE_OTHER): Payer: Medicare Other | Admitting: Nurse Practitioner

## 2023-10-06 VITALS — BP 139/88 | HR 97 | Resp 18 | Ht 66.0 in | Wt 138.4 lb

## 2023-10-06 DIAGNOSIS — F17200 Nicotine dependence, unspecified, uncomplicated: Secondary | ICD-10-CM | POA: Diagnosis not present

## 2023-10-06 DIAGNOSIS — I739 Peripheral vascular disease, unspecified: Secondary | ICD-10-CM

## 2023-10-06 DIAGNOSIS — E7800 Pure hypercholesterolemia, unspecified: Secondary | ICD-10-CM | POA: Diagnosis not present

## 2023-10-06 DIAGNOSIS — I6523 Occlusion and stenosis of bilateral carotid arteries: Secondary | ICD-10-CM | POA: Diagnosis not present

## 2023-10-06 DIAGNOSIS — I714 Abdominal aortic aneurysm, without rupture, unspecified: Secondary | ICD-10-CM

## 2023-10-06 DIAGNOSIS — I700 Atherosclerosis of aorta: Secondary | ICD-10-CM | POA: Diagnosis not present

## 2023-10-08 ENCOUNTER — Other Ambulatory Visit: Payer: Self-pay | Admitting: Physician Assistant

## 2023-10-09 ENCOUNTER — Other Ambulatory Visit: Payer: Self-pay

## 2023-10-09 DIAGNOSIS — R0602 Shortness of breath: Secondary | ICD-10-CM

## 2023-10-10 ENCOUNTER — Encounter: Payer: Self-pay | Admitting: Pulmonary Disease

## 2023-10-10 ENCOUNTER — Ambulatory Visit: Admitting: Pulmonary Disease

## 2023-10-10 ENCOUNTER — Ambulatory Visit

## 2023-10-10 VITALS — BP 120/62 | HR 82 | Temp 96.9°F | Ht 66.0 in | Wt 138.8 lb

## 2023-10-10 DIAGNOSIS — I350 Nonrheumatic aortic (valve) stenosis: Secondary | ICD-10-CM

## 2023-10-10 DIAGNOSIS — F17210 Nicotine dependence, cigarettes, uncomplicated: Secondary | ICD-10-CM

## 2023-10-10 DIAGNOSIS — J449 Chronic obstructive pulmonary disease, unspecified: Secondary | ICD-10-CM

## 2023-10-10 DIAGNOSIS — R0602 Shortness of breath: Secondary | ICD-10-CM

## 2023-10-10 DIAGNOSIS — R0609 Other forms of dyspnea: Secondary | ICD-10-CM

## 2023-10-10 LAB — PULMONARY FUNCTION TEST
DL/VA % pred: 55 %
DL/VA: 2.28 ml/min/mmHg/L
DLCO unc % pred: 44 %
DLCO unc: 8.96 ml/min/mmHg
FEF 25-75 Post: 0.91 L/s
FEF 25-75 Pre: 0.8 L/s
FEF2575-%Change-Post: 13 %
FEF2575-%Pred-Post: 50 %
FEF2575-%Pred-Pre: 45 %
FEV1-%Change-Post: 4 %
FEV1-%Pred-Post: 61 %
FEV1-%Pred-Pre: 59 %
FEV1-Post: 1.43 L
FEV1-Pre: 1.37 L
FEV1FVC-%Change-Post: -1 %
FEV1FVC-%Pred-Pre: 86 %
FEV6-%Change-Post: 4 %
FEV6-%Pred-Post: 75 %
FEV6-%Pred-Pre: 72 %
FEV6-Post: 2.2 L
FEV6-Pre: 2.11 L
FEV6FVC-%Change-Post: 0 %
FEV6FVC-%Pred-Post: 104 %
FEV6FVC-%Pred-Pre: 104 %
FVC-%Change-Post: 5 %
FVC-%Pred-Post: 72 %
FVC-%Pred-Pre: 69 %
FVC-Post: 2.23 L
FVC-Pre: 2.11 L
Post FEV1/FVC ratio: 64 %
Post FEV6/FVC ratio: 99 %
Pre FEV1/FVC ratio: 65 %
Pre FEV6/FVC Ratio: 100 %
RV % pred: 118 %
RV: 2.82 L
TLC % pred: 93 %
TLC: 5.01 L

## 2023-10-10 NOTE — Patient Instructions (Signed)
 VISIT SUMMARY:  Today, you came in for a follow-up appointment to discuss your COPD and other health concerns. Your COPD is well-managed with your current inhaler, and you have not needed to use your emergency inhaler. You mentioned feeling overwhelmed by your multiple health issues, including chronic kidney disease and peripheral artery disease. You also shared that you are currently smoking about a pack a day and are under the care of multiple doctors for your various conditions. Additionally, you noted that using baking soda and water has significantly improved your mouth thrush.  YOUR PLAN:  -CHRONIC OBSTRUCTIVE PULMONARY DISEASE (COPD): COPD is a chronic lung condition that makes it hard to breathe. Your COPD is well-managed with your Stiolto inhaler, and your lung function has improved. You have not had any recent flare-ups and are confident in using your emergency inhaler if needed. Continue using your Stiolto inhaler as prescribed, and we will schedule a follow-up in six months.  -NICOTINE  DEPENDENCE, CIGARETTES: Nicotine  dependence means you are addicted to the nicotine  in cigarettes. You are currently smoking about a pack a day. It is important to consider quitting as it significantly affects your COPD.  INSTRUCTIONS:  Please continue using your Stiolto inhaler as prescribed. We will schedule a follow-up appointment in six months to monitor your COPD. Consider discussing smoking cessation options with your healthcare providers, as quitting smoking can greatly improve your overall health.

## 2023-10-10 NOTE — Patient Instructions (Signed)
 Full PFT completed today ? ?

## 2023-10-10 NOTE — Progress Notes (Signed)
 Full PFT completed today ? ?

## 2023-10-10 NOTE — Progress Notes (Signed)
 Subjective:    Patient ID: Tracy Bell, female    DOB: [birth year], 73 y.o.   MRN: [id number]  Patient Care Team: Calista Catching, FNP as PCP - General (Family Medicine) Wenona Hamilton, MD as PCP - Cardiology (Cardiology)  Chief Complaint  Patient presents with   Follow-up    No SOB or wheezing. Cough in the mornings with clear thick sputum.     BACKGROUND/INTERVAL:Patient is a 73 year old current smoker whom I initially saw on 2021. The patient opted to switch practices and was most recently seeing Dr. Horace Lye. She last saw Dr. Jaclynn Mast in August 2024. She cites communication issues with Dr. Aleskerov and wanted to return to the practice here.  I last saw her on 18 May 2023.  HPI Discussed the use of AI scribe software for clinical note transcription with the patient, who gave verbal consent to proceed.  History of Present Illness   Tracy Bell is a 73 year old female with COPD who presents for follow-up.  She is managing her COPD with a Stiolto inhaler and has an emergency inhaler, albuterol , which she has not needed to use. No acute respiratory symptoms necessitating the use of the emergency inhaler.  She feels overwhelmed by her multiple health issues, including a recent diagnosis of chronic kidney disease and peripheral artery disease (PAD). She notes frequent urination and is stressed about potential surgeries related to her PAD. She is under the care of multiple doctors, including cardiology and vascular surgery, for her various conditions.  She has a history of smoking and currently smokes about a pack a day. She is on Eliquis  due to her PVD issues. She recalls a carotid artery operation on July 13, 2018, which she regrets due to ongoing discomfort since the procedure.  She is grateful for a previous recommendation to use baking soda and water to manage mouth thrush, which has improved her symptoms significantly. Her food tastes better, and she has  lost 26 pounds, which she attributes to the improvement in her oral health.   She had PFTs today these were discussed with her.  FEV1 is 1.37 L or 59% predicted, FVC is 2.11 L or 69% predicted, FEV1/FVC is 65%, lung volumes are normal, diffusion capacity moderately to severely decreased.  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  Function is actually improved from prior [facility] PFTs.     Review of Systems A 10 point review of systems was performed and it is as noted above otherwise negative.   Patient Active Problem List   Diagnosis Date Noted   Hair thinning 03/10/2023   Vaginal bleeding 03/10/2023   Pedal edema 03/10/2023   Eyelid abnormality 03/10/2023   Cholelithiasis 10/04/2022   Atrial fibrillation (HCC) 07/18/2022   Subclinical hypothyroidism 07/14/2021   Pain in limb 10/28/2020   Abnormal findings on diagnostic imaging of lung 06/18/2020   Leg pain, lateral, left 06/12/2020   Healthcare maintenance 01/13/2020   Do not resuscitate discussion 12/03/2019   Lymphadenopathy 10/14/2019   Urinary incontinence 10/14/2019   SVT (supraventricular tachycardia) (HCC) 06/21/2019   Palpitations 07/27/2018   Carotid stenosis, right 07/12/2018   Carotid stenosis, bilateral 05/10/2018   Premature atrial contractions 05/01/2018   Erythrocytosis 04/14/2018   Left carotid bruit 04/10/2018   Thoracic aortic aneurysm without rupture (HCC) 04/10/2018   SOB (shortness of breath) on exertion 04/05/2018   Depression, recurrent (HCC) 03/23/2018   Dizziness 03/23/2018   Abdominal aneurysm (HCC) 03/23/2018  Chest pain with high risk for cardiac etiology 12/15/2015   Stage 2 moderate COPD by GOLD classification (HCC) 12/15/2015   Nodule of left lung 11/17/2015   Hypercholesteremia 11/17/2015   Coronary atherosclerosis 11/05/2015   Personal history of tobacco use, presenting hazards to health 08/28/2015   Adjustment disorder with mixed anxiety and depressed  mood 09/30/2014   Essential hypertension 05/22/2014   Atherosclerosis of aorta (HCC) 04/17/2014   Aortic stenosis, mild 04/17/2014   Tobacco use disorder 03/14/2014   Special screening for malignant neoplasms, colon 03/14/2014   Murmur 03/14/2014   Dysphagia 03/14/2014   Tobacco dependence syndrome 03/14/2014    Social History   Tobacco Use   Smoking status: Every Day    Current packs/day: 2.00    Average packs/day: 2.0 packs/day for 60.6 years (121.3 ttl pk-yrs)    Types: Cigarettes    Start date: 05/03/1963   Smokeless tobacco: Never   Tobacco comments:    1 PPD- khj 10/10/2023  Substance Use Topics   Alcohol use: Yes    Alcohol/week: 0.0 standard drinks of alcohol    Comment: Verde Spumante occasionally    No Known Allergies  Current Meds  Medication Sig   albuterol  (VENTOLIN  HFA) 108 (90 Base) MCG/ACT inhaler Inhale 2 puffs into the lungs every 6 (six) hours as needed for wheezing or shortness of breath.   atorvastatin  (LIPITOR) 80 MG tablet Take 1 tablet (80 mg total) by mouth daily.   Biotin 5000 MCG CAPS Take by mouth.   cholecalciferol  (VITAMIN D3) 25 MCG (1000 UT) tablet Take 1,000 Units by mouth daily.   diltiazem  (CARDIZEM  CD) 360 MG 24 hr capsule TAKE 1 CAPSULE BY MOUTH EVERY DAY   ELIQUIS  5 MG TABS tablet TAKE 1 TABLET BY MOUTH TWICE A DAY   furosemide  (LASIX ) 20 MG tablet TAKE 1 TABLET (20 MG TOTAL) BY MOUTH DAILY AS NEEDED FOR EDEMA (TAKE AS NEEDED FOR SWELLING OR WEIGHT GAIN GREATER THAN 3 POUNDS OVERNIGHT). TAKE 1 TAB ONCE DAILY FOR 3 DAYS THEN AS NEEDED   losartan  (COZAAR ) 50 MG tablet TAKE 1 TABLET BY MOUTH EVERY DAY   Multiple Vitamins-Minerals (CENTRUM ADULTS) TABS Take 1 tablet by mouth daily.   Tiotropium Bromide-Olodaterol (STIOLTO RESPIMAT ) 2.5-2.5 MCG/ACT AERS Inhale 2 puffs into the lungs daily.    Immunization History  Administered Date(s) Administered   Fluad Quad(high Dose 65+) 01/17/2020   PFIZER Comirnaty(Gray Top)Covid-19 Tri-Sucrose  Vaccine 07/03/2020   PFIZER(Purple Top)SARS-COV-2 Vaccination 08/01/2019, 08/28/2019   Pneumococcal Polysaccharide-23 07/13/2018   Tdap 12/02/2014        Objective:     BP 120/62 (BP Location: Right Arm, Cuff Size: Normal)   Pulse 82   Temp (!) 96.9 F (36.1 C)   Ht 5' 6 (1.676 m)   Wt 138 lb 12.8 oz (63 kg)   SpO2 96%   BMI 22.40 kg/m   SpO2: 96 % O2 Device: None (Room air)  GENERAL: Awake, alert, fully ambulatory.  No respiratory distress, no use of accessories    HEAD: Normocephalic, atraumatic.  EYES: Pupils equal, round, reactive to light.  No scleral icterus.  MOUTH: Oral mucosa moist.  No significant thrush.Aaron Aas NECK: Supple. No thyromegaly. Trachea midline. No JVD.    Query shotty adenopathy on the right anterior cervical chain, no tenderness. PULMONARY: Good air entry bilaterally.  Scattered rhonchi noted bilaterally.  No wheezes.  No rales. CARDIOVASCULAR: S1 and S2. Regular rate and rhythm.  She has a harsh 2/6 to 3/6 murmur consistent with  aortic stenosis.  No rubs or gallops noted. GASTROINTESTINAL: No distention noted. MUSCULOSKELETAL: No joint deformity, no clubbing, +1-2 edema.  NEUROLOGIC: Awake, alert, fully oriented.  Fluent speech.  No overt focal deficit. SKIN: Intact,warm,dry. PSYCH: Mood and behavior appropriate.   Assessment & Plan:     ICD-10-CM   1. Stage 2 moderate COPD by GOLD classification (HCC)  J44.9     2. DOE (dyspnea on exertion)  R06.09 Pulmonary Function Test [facility] Only    3. Aortic stenosis, moderate  I35.0     4. Tobacco dependence due to cigarettes  F17.210      Discussion:    Chronic Obstructive Pulmonary Disease (COPD) COPD is well-managed with improved lung function. Pulmonary function tests show improvement from previous results, with current values at 59, up from the forties. She effectively uses the Stiolto inhaler and reports no recent exacerbations. Inhaler use education was reinforced, and she is confident in using the  emergency inhaler if needed. - Continue Stiolto inhaler - Schedule follow-up in six months  Nicotine  dependence, cigarettes Continues to smoke approximately one pack per day.  She was counseled with regards to discontinuation of smoking.  Advised that this is the most significant positive factor in COPD management.     Advised if symptoms do not improve or worsen, to please contact office for sooner follow up or seek emergency care.    I spent 30 minutes of dedicated to the care of this patient on the date of this encounter to include pre-visit review of records, face-to-face time with the patient discussing conditions above, post visit ordering of testing, clinical documentation with the electronic health record, making appropriate referrals as documented, and communicating necessary findings to members of the patients care team.     C. Chloe Counter, MD Advanced Bronchoscopy PCCM [facility]    *This note was generated using voice recognition software/Dragon and/or AI transcription program.  Despite best efforts to proofread, errors can occur which can change the meaning. Any transcriptional errors that result from this process are unintentional and may not be fully corrected at the time of dictation.

## 2023-10-15 ENCOUNTER — Encounter (INDEPENDENT_AMBULATORY_CARE_PROVIDER_SITE_OTHER): Payer: Self-pay | Admitting: Nurse Practitioner

## 2023-10-15 NOTE — Progress Notes (Incomplete)
 Subjective:    Patient ID: Tracy Bell, female    DOB: 1950/09/07, 73 y.o.   MRN: [id number] Chief Complaint  Patient presents with  . Follow-up    1 year follow up AAA + Carotid     HPI  Review of Systems     Objective:   Physical Exam  BP 139/88 (BP Location: Left Arm, Patient Position: Sitting, Cuff Size: Normal)   Pulse 97   Resp 18   Ht 5' 6 (1.676 m)   Wt 138 lb 6.4 oz (62.8 kg)   BMI 22.34 kg/m   Past Medical History:  Diagnosis Date  . Anxiety   . COPD (chronic obstructive pulmonary disease) (HCC)   . Cough   . Depression   . Dizziness   . Dyspnea   . Heart murmur   . Hyperlipidemia   . Hypertension   . Lightheadedness   . Personal history of tobacco use, presenting hazards to health 08/28/2015    Social History   Socioeconomic History  . Marital status: Widowed    Spouse name: Not on file  . Number of children: Not on file  . Years of education: Not on file  . Highest education level: Not on file  Occupational History  . Occupation: retired  Tobacco Use  . Smoking status: Every Day    Current packs/day: 2.00    Average packs/day: 2.0 packs/day for 60.7 years (121.3 ttl pk-yrs)    Types: Cigarettes    Start date: 05/03/1963  . Smokeless tobacco: Never  . Tobacco comments:    1 PPD- khj 10/10/2023  Vaping Use  . Vaping status: Never Used  Substance and Sexual Activity  . Alcohol use: Yes    Alcohol/week: 0.0 standard drinks of alcohol    Comment: Verde Spumante occasionally  . Drug use: No  . Sexual activity: Not on file  Other Topics Concern  . Not on file  Social History Narrative   Lives with husband. Has 11 pets in home. Has one son, lives locally.   Was born and raised in [place].   Husband is older than her and she provides care for him.      Work - retired. Invented water bottle for pets   Social Drivers of Health   Financial Resource Strain: Medium Risk (04/13/2021)   Overall Financial Resource Strain (CARDIA)   .  Difficulty of Paying Living Expenses: Somewhat hard  Food Insecurity: Not on file  Transportation Needs: Not on file  Physical Activity: Not on file  Stress: Not on file  Social Connections: Not on file  Intimate Partner Violence: Not on file    Past Surgical History:  Procedure Laterality Date  . ABDOMINAL HYSTERECTOMY  1980   complete  . BREAST BIOPSY  1970   normal  . DILATION AND CURETTAGE OF UTERUS    . ENDARTERECTOMY Right 07/12/2018   Procedure: ENDARTERECTOMY CAROTID;  Surgeon: Celso College, MD;  Location: [facility] ORS;  Service: Vascular;  Laterality: Right;  . TUBAL LIGATION      Family History  Problem Relation Age of Onset  . Cancer Mother        Pancreatic Cancer   . Cancer Father        Stomach Cancer   . Lung cancer Sister   . Cancer Sister 29       ovarian  . Lung cancer Sister     No Known Allergies     Latest Ref Rng & Units 03/29/2023  11:03 AM 03/10/2023   10:19 AM 05/13/2022   10:50 AM  CBC  WBC 4.0 - 10.5 K/uL 9.7  14.5  9.2   Hemoglobin 12.0 - 15.0 g/dL 16.1  09.6  04.5   Hematocrit 36.0 - 46.0 % 44.6  43.0  48.5   Platelets 150.0 - 400.0 K/uL 220.0  196.0  201       CMP     Component Value Date/Time   NA 140 03/29/2023 1103   K 3.7 03/29/2023 1103   CL 103 03/29/2023 1103   CO2 30 03/29/2023 1103   GLUCOSE 109 (H) 03/29/2023 1103   BUN 14 03/29/2023 1103   CREATININE 1.20 03/29/2023 1103   CALCIUM  9.3 03/29/2023 1103   PROT 6.1 03/29/2023 1103   PROT 6.0 03/29/2023 1103   ALBUMIN 4.0 03/10/2023 1019   AST 20 03/10/2023 1019   ALT 40 (H) 03/10/2023 1019   ALKPHOS 74 03/10/2023 1019   BILITOT 0.9 03/10/2023 1019   GFR 44.49 (L) 03/29/2023 1103   GFRNONAA 55 (L) 07/26/2022 1208     VAS US  ABI WITH/WO TBI Result Date: 09/21/2023  LOWER EXTREMITY DOPPLER STUDY Patient Name:  Tracy Bell  Date of Exam:   09/20/2023 Medical Rec #: [id number]         Accession #:    [id number] Date of Birth: 1950-11-15         Patient Gender: F  Patient Age:   59 years Exam Location:  [place] Procedure:      VAS US  ABI WITH/WO TBI Referring Phys: --------------------------------------------------------------------------------  Indications: Claudication, and peripheral artery disease. High Risk Factors: Hypertension, hyperlipidemia, current smoker, coronary artery                    disease. Other Factors: Patient states her legs give out when walking and she has to stop                and rest before continuing on. She has numbness in both lower                legs, left greater than right.  Comparison Study: Patient has dilatation in the distal abdominal aorta and                   arteries on previous exams. Performing Technologist: McFatter, Mylinda Asa RVT  Examination Guidelines: A complete evaluation includes at minimum, Doppler waveform signals and systolic blood pressure reading at the level of bilateral brachial, anterior tibial, and posterior tibial arteries, when vessel segments are accessible. Bilateral testing is considered an integral part of a complete examination. Photoelectric Plethysmograph (PPG) waveforms and toe systolic pressure readings are included as required and additional duplex testing as needed. Limited examinations for reoccurring indications may be performed as noted.  ABI Findings: +---------+------------------+-----+----------+--------+ Right    Rt Pressure (mmHg)IndexWaveform  Comment  +---------+------------------+-----+----------+--------+ Brachial 136                    biphasic           +---------+------------------+-----+----------+--------+ ATA      103               0.76 monophasic         +---------+------------------+-----+----------+--------+ PTA      111               0.82 monophasic         +---------+------------------+-----+----------+--------+ Tracy Bell  0.49 Abnormal           +---------+------------------+-----+----------+--------+  +---------+------------------+-----+----------+-------+ Left     Lt Pressure (mmHg)IndexWaveform  Comment +---------+------------------+-----+----------+-------+ Brachial 134                    biphasic          +---------+------------------+-----+----------+-------+ ATA      92                0.68 monophasic        +---------+------------------+-----+----------+-------+ PTA      113               0.83 monophasic        +---------+------------------+-----+----------+-------+ Great Toe59                0.43 Abnormal          +---------+------------------+-----+----------+-------+ +-------+-----------+-----------+------------+------------+ ABI/TBIToday's ABIToday's TBIPrevious ABIPrevious TBI +-------+-----------+-----------+------------+------------+ Right  0.82       0.49       1.14        0.64         +-------+-----------+-----------+------------+------------+ Left   0.83       0.43       1.14        0.61         +-------+-----------+-----------+------------+------------+ Bilateral TBIs and ABIs appear decreased. See Arterial Duplex study  Summary: Right: Resting right ankle-brachial index indicates mild right lower extremity arterial disease. The right toe-brachial index is abnormal. Left: Resting left ankle-brachial index indicates mild left lower extremity arterial disease. The left toe-brachial index is abnormal. *See table(s) above for measurements and observations.  Electronically signed by Lauro Portal MD on 09/21/2023 at 10:57:01 AM.    Final        Assessment & Plan:   1. Abdominal aneurysm (HCC) (Primary) ***  2. Carotid stenosis, bilateral ***  3. Tobacco use disorder ***  4. PAD (peripheral artery disease) (HCC) ***  5. Hypercholesteremia ***   Current Outpatient Medications on File Prior to Visit  Medication Sig Dispense Refill  . atorvastatin  (LIPITOR) 80 MG tablet Take 1 tablet (80 mg total) by mouth daily. 90 tablet 2  .  Biotin 5000 MCG CAPS Take by mouth.    . cholecalciferol  (VITAMIN D3) 25 MCG (1000 UT) tablet Take 1,000 Units by mouth daily.    . diltiazem  (CARDIZEM  CD) 360 MG 24 hr capsule TAKE 1 CAPSULE BY MOUTH EVERY DAY 90 capsule 3  . ELIQUIS  5 MG TABS tablet TAKE 1 TABLET BY MOUTH TWICE A DAY 180 tablet 4  . losartan  (COZAAR ) 50 MG tablet TAKE 1 TABLET BY MOUTH EVERY DAY 90 tablet 3  . Multiple Vitamins-Minerals (CENTRUM ADULTS) TABS Take 1 tablet by mouth daily. 30 tablet 6  . Tiotropium Bromide-Olodaterol (STIOLTO RESPIMAT ) 2.5-2.5 MCG/ACT AERS Inhale 2 puffs into the lungs daily. 4 g 3  . albuterol  (VENTOLIN  HFA) 108 (90 Base) MCG/ACT inhaler Inhale 2 puffs into the lungs every 6 (six) hours as needed for wheezing or shortness of breath. 8 g 2   No current facility-administered medications on file prior to visit.    There are no Patient Instructions on file for this visit. No follow-ups on file.   Sandy Blouch E Lachell Rochette, NP

## 2023-10-15 NOTE — Progress Notes (Signed)
 Subjective:    Patient ID: Tracy Bell, female    DOB: [birth year], 73 y.o.   MRN: [id number] Chief Complaint  Patient presents with   Follow-up    1 year follow up AAA + Carotid     HPI  Review of Systems     Objective:   Physical Exam  BP 139/88 (BP Location: Left Arm, Patient Position: Sitting, Cuff Size: Normal)   Pulse 97   Resp 18   Ht 5' 6 (1.676 m)   Wt 138 lb 6.4 oz (62.8 kg)   BMI 22.34 kg/m   Past Medical History:  Diagnosis Date   Anxiety    COPD (chronic obstructive pulmonary disease) (HCC)    Cough    Depression    Dizziness    Dyspnea    Heart murmur    Hyperlipidemia    Hypertension    Lightheadedness    Personal history of tobacco use, presenting hazards to health 08/28/2015    Social History   Socioeconomic History   Marital status: Widowed    Spouse name: Not on file   Number of children: Not on file   Years of education: Not on file   Highest education level: Not on file  Occupational History   Occupation: retired  Tobacco Use   Smoking status: Every Day    Current packs/day: 2.00    Average packs/day: 2.0 packs/day for 60.7 years (121.3 ttl pk-yrs)    Types: Cigarettes    Start date: 05/03/1963   Smokeless tobacco: Never   Tobacco comments:    1 PPD- khj 10/10/2023  Vaping Use   Vaping status: Never Used  Substance and Sexual Activity   Alcohol use: Yes    Alcohol/week: 0.0 standard drinks of alcohol    Comment: Verde Spumante occasionally   Drug use: No   Sexual activity: Not on file  Other Topics Concern   Not on file  Social History Narrative   Lives with husband. Has 11 pets in home. Has one son, lives locally.   Was born and raised in [place].   Husband is older than her and she provides care for him.      Work - retired. Invented water bottle for pets   Social Drivers of Health   Financial Resource Strain: Medium Risk (04/13/2021)   Overall Financial Resource Strain (CARDIA)    Difficulty of Paying Living  Expenses: Somewhat hard  Food Insecurity: Not on file  Transportation Needs: Not on file  Physical Activity: Not on file  Stress: Not on file  Social Connections: Not on file  Intimate Partner Violence: Not on file    Past Surgical History:  Procedure Laterality Date   ABDOMINAL HYSTERECTOMY  1980   complete   BREAST BIOPSY  1970   normal   DILATION AND CURETTAGE OF UTERUS     ENDARTERECTOMY Right 07/12/2018   Procedure: ENDARTERECTOMY CAROTID;  Surgeon: Celso College, MD;  Location: [facility] ORS;  Service: Vascular;  Laterality: Right;   TUBAL LIGATION      Family History  Problem Relation Age of Onset   Cancer Mother        Pancreatic Cancer    Cancer Father        Stomach Cancer    Lung cancer Sister    Cancer Sister 40       ovarian   Lung cancer Sister     No Known Allergies     Latest Ref Rng & Units 03/29/2023  11:03 AM 03/10/2023   10:19 AM 05/13/2022   10:50 AM  CBC  WBC 4.0 - 10.5 K/uL 9.7  14.5  9.2   Hemoglobin 12.0 - 15.0 g/dL 16.1  09.6  04.5   Hematocrit 36.0 - 46.0 % 44.6  43.0  48.5   Platelets 150.0 - 400.0 K/uL 220.0  196.0  201       CMP     Component Value Date/Time   NA 140 03/29/2023 1103   K 3.7 03/29/2023 1103   CL 103 03/29/2023 1103   CO2 30 03/29/2023 1103   GLUCOSE 109 (H) 03/29/2023 1103   BUN 14 03/29/2023 1103   CREATININE 1.20 03/29/2023 1103   CALCIUM  9.3 03/29/2023 1103   PROT 6.1 03/29/2023 1103   PROT 6.0 03/29/2023 1103   ALBUMIN 4.0 03/10/2023 1019   AST 20 03/10/2023 1019   ALT 40 (H) 03/10/2023 1019   ALKPHOS 74 03/10/2023 1019   BILITOT 0.9 03/10/2023 1019   GFR 44.49 (L) 03/29/2023 1103   GFRNONAA 55 (L) 07/26/2022 1208     VAS US  ABI WITH/WO TBI Result Date: 09/21/2023  LOWER EXTREMITY DOPPLER STUDY Patient Name:  Tracy Bell  Date of Exam:   09/20/2023 Medical Rec #: [id number]         Accession #:    [id number] Date of Birth: 08-Aug-1950         Patient Gender: F Patient Age:   58 years Exam Location:   [facility] Procedure:      VAS US  ABI WITH/WO TBI Referring Phys: --------------------------------------------------------------------------------  Indications: Claudication, and peripheral artery disease. High Risk Factors: Hypertension, hyperlipidemia, current smoker, coronary artery                    disease. Other Factors: Patient states her legs give out when walking and she has to stop                and rest before continuing on. She has numbness in both lower                legs, left greater than right.  Comparison Study: Patient has dilatation in the distal abdominal aorta and                   arteries on previous exams. Performing Technologist: McFatter, Mylinda Asa RVT  Examination Guidelines: A complete evaluation includes at minimum, Doppler waveform signals and systolic blood pressure reading at the level of bilateral brachial, anterior tibial, and posterior tibial arteries, when vessel segments are accessible. Bilateral testing is considered an integral part of a complete examination. Photoelectric Plethysmograph (PPG) waveforms and toe systolic pressure readings are included as required and additional duplex testing as needed. Limited examinations for reoccurring indications may be performed as noted.  ABI Findings: +---------+------------------+-----+----------+--------+ Right    Rt Pressure (mmHg)IndexWaveform  Comment  +---------+------------------+-----+----------+--------+ Brachial 136                    biphasic           +---------+------------------+-----+----------+--------+ ATA      103               0.76 monophasic         +---------+------------------+-----+----------+--------+ PTA      111               0.82 monophasic         +---------+------------------+-----+----------+--------+ Jackqulyn Masse  0.49 Abnormal           +---------+------------------+-----+----------+--------+ +---------+------------------+-----+----------+-------+ Left     Lt  Pressure (mmHg)IndexWaveform  Comment +---------+------------------+-----+----------+-------+ Brachial 134                    biphasic          +---------+------------------+-----+----------+-------+ ATA      92                0.68 monophasic        +---------+------------------+-----+----------+-------+ PTA      113               0.83 monophasic        +---------+------------------+-----+----------+-------+ Great Toe59                0.43 Abnormal          +---------+------------------+-----+----------+-------+ +-------+-----------+-----------+------------+------------+ ABI/TBIToday's ABIToday's TBIPrevious ABIPrevious TBI +-------+-----------+-----------+------------+------------+ Right  0.82       0.49       1.14        0.64         +-------+-----------+-----------+------------+------------+ Left   0.83       0.43       1.14        0.61         +-------+-----------+-----------+------------+------------+ Bilateral TBIs and ABIs appear decreased. See Arterial Duplex study  Summary: Right: Resting right ankle-brachial index indicates mild right lower extremity arterial disease. The right toe-brachial index is abnormal. Left: Resting left ankle-brachial index indicates mild left lower extremity arterial disease. The left toe-brachial index is abnormal. *See table(s) above for measurements and observations.  Electronically signed by Lauro Portal MD on 09/21/2023 at 10:57:01 AM.    Final        Assessment & Plan:   1. Abdominal aneurysm (HCC) (Primary) Recommend: No surgery or intervention is indicated at this time.  The patient has an asymptomatic abdominal aortic aneurysm that is less than 4 cm in maximal diameter.    I have reviewed the natural history of abdominal aortic aneurysm and the small risk of rupture for aneurysm less than 5 cm in size.  However, as these small aneurysms tend to enlarge over time, continued surveillance with ultrasound or CT scan  is mandatory.   I have also discussed optimizing medical management with hypertension and lipid control and the negative effect that any tobacco products have on aneurysmal disease.  The patient is also encouraged to exercise a minimum of 30 minutes 4 times a week.   Should the patient develop new onset abdominal or back pain or signs of peripheral embolization they are instructed to seek medical attention immediately and to alert the physician providing care that they have an aneurysm.   The patient voices their understanding.  The patient will return in 12 months with an aortic duplex.  2. Carotid stenosis, bilateral Recommend:  Given the patient's asymptomatic subcritical stenosis no further invasive testing or surgery at this time.  Duplex ultrasound shows 1 to 39% stenosis of the right ICA with 40 to 59% stenosis of the left ICA  Continue antiplatelet therapy as prescribed Continue management of CAD, HTN and Hyperlipidemia Healthy heart diet,  encouraged exercise at least 4 times per week  Follow up in 12 months with duplex ultrasound and physical exam   3. Tobacco use disorder Smoking cessation was discussed, 3-10 minutes spent on this topic specifically  4. PAD (peripheral artery disease) (HCC) As suggested by the patient's noninvasive studies  that were done outside of our office, as well as in accordance with her current symptoms I suspect that she is dealing with inflow disease.  I had a very long discussion with her regarding options for intervention including angiogram which includes stenting.  However I did discuss with the patient that it was found that during angiogram her anatomy was not suitable for stenting, the next step would be surgery for treatment.  The patient notes that she has multiple other conditions and is overwhelmed with the thought of intervention at this time.  She wishes to think about it and will contact us  if she wishes to move forward with  angiogram.  5. Hypercholesteremia Continue statin as ordered and reviewed, no changes at this time   Current Outpatient Medications on File Prior to Visit  Medication Sig Dispense Refill   atorvastatin  (LIPITOR) 80 MG tablet Take 1 tablet (80 mg total) by mouth daily. 90 tablet 2   Biotin 5000 MCG CAPS Take by mouth.     cholecalciferol  (VITAMIN D3) 25 MCG (1000 UT) tablet Take 1,000 Units by mouth daily.     diltiazem  (CARDIZEM  CD) 360 MG 24 hr capsule TAKE 1 CAPSULE BY MOUTH EVERY DAY 90 capsule 3   ELIQUIS  5 MG TABS tablet TAKE 1 TABLET BY MOUTH TWICE A DAY 180 tablet 4   losartan  (COZAAR ) 50 MG tablet TAKE 1 TABLET BY MOUTH EVERY DAY 90 tablet 3   Multiple Vitamins-Minerals (CENTRUM ADULTS) TABS Take 1 tablet by mouth daily. 30 tablet 6   Tiotropium Bromide-Olodaterol (STIOLTO RESPIMAT ) 2.5-2.5 MCG/ACT AERS Inhale 2 puffs into the lungs daily. 4 g 3   albuterol  (VENTOLIN  HFA) 108 (90 Base) MCG/ACT inhaler Inhale 2 puffs into the lungs every 6 (six) hours as needed for wheezing or shortness of breath. 8 g 2   No current facility-administered medications on file prior to visit.    There are no Patient Instructions on file for this visit. No follow-ups on file.   Jolon Degante E Jettson Crable, NP

## 2023-10-18 ENCOUNTER — Encounter: Payer: Self-pay | Admitting: Family

## 2023-11-05 ENCOUNTER — Encounter (INDEPENDENT_AMBULATORY_CARE_PROVIDER_SITE_OTHER): Payer: Self-pay

## 2023-11-13 ENCOUNTER — Ambulatory Visit: Admitting: Physician Assistant

## 2023-11-26 DIAGNOSIS — I70229 Atherosclerosis of native arteries of extremities with rest pain, unspecified extremity: Secondary | ICD-10-CM | POA: Insufficient documentation

## 2023-11-26 NOTE — Progress Notes (Signed)
 MRN : [id number]  Tracy Bell is a 73 y.o. (October 22, 1950) female who presents with chief complaint of check circulation.  History of Present Illness:   The patient returns to the office for followup regarding atherosclerotic changes of the lower extremities and discussion of surgery.  There have been no interval changes in lower extremity symptoms. No interval shortening of the patient's claudication distance or development of rest pain symptoms. No new ulcers or wounds have occurred since the last visit.  There have been no significant changes to the patient's overall health care.  The patient denies amaurosis fugax or recent TIA symptoms. There are no documented recent neurological changes noted. There is no history of DVT, PE or superficial thrombophlebitis. The patient denies recent episodes of angina or shortness of breath.    No outpatient medications have been marked as taking for the 11/27/23 encounter (Appointment) with Jama, Cordella MATSU, MD.    Past Medical History:  Diagnosis Date   Anxiety    COPD (chronic obstructive pulmonary disease) (HCC)    Cough    Depression    Dizziness    Dyspnea    Heart murmur    Hyperlipidemia    Hypertension    Lightheadedness    Personal history of tobacco use, presenting hazards to health 08/28/2015    Past Surgical History:  Procedure Laterality Date   ABDOMINAL HYSTERECTOMY  1980   complete   BREAST BIOPSY  1970   normal   DILATION AND CURETTAGE OF UTERUS     ENDARTERECTOMY Right 07/12/2018   Procedure: ENDARTERECTOMY CAROTID;  Surgeon: Marea Selinda RAMAN, MD;  Location: [facility] ORS;  Service: Vascular;  Laterality: Right;   TUBAL LIGATION      Social History Social History   Tobacco Use   Smoking status: Every Day    Current packs/day: 2.00    Average packs/day: 2.0 packs/day for 60.8 years (121.5 ttl pk-yrs)    Types: Cigarettes    Start date:  05/03/1963   Smokeless tobacco: Never   Tobacco comments:    1 PPD- khj 10/10/2023  Vaping Use   Vaping status: Never Used  Substance Use Topics   Alcohol use: Yes    Alcohol/week: 0.0 standard drinks of alcohol    Comment: Verde Spumante occasionally   Drug use: No    Family History Family History  Problem Relation Age of Onset   Cancer Mother        Pancreatic Cancer    Cancer Father        Stomach Cancer    Lung cancer Sister    Cancer Sister 86       ovarian   Lung cancer Sister     No Known Allergies   REVIEW OF SYSTEMS (Negative unless checked)  Constitutional: [] Weight loss  [] Fever  [] Chills Cardiac: [] Chest pain   [] Chest pressure   [] Palpitations   [] Shortness of breath when laying flat   [] Shortness of breath with exertion. Vascular:  [x] Pain in legs with walking   [] Pain in legs at rest  [] History of DVT   [] Phlebitis   [] Swelling  in legs   [] Varicose veins   [] Non-healing ulcers Pulmonary:   [] Uses home oxygen   [] Productive cough   [] Hemoptysis   [] Wheeze  [] COPD   [] Asthma Neurologic:  [] Dizziness   [] Seizures   [] History of stroke   [] History of TIA  [] Aphasia   [] Vissual changes   [] Weakness or numbness in arm   [] Weakness or numbness in leg Musculoskeletal:   [] Joint swelling   [] Joint pain   [] Low back pain Hematologic:  [] Easy bruising  [] Easy bleeding   [] Hypercoagulable state   [] Anemic Gastrointestinal:  [] Diarrhea   [] Vomiting  [] Gastroesophageal reflux/heartburn   [] Difficulty swallowing. Genitourinary:  [] Chronic kidney disease   [] Difficult urination  [] Frequent urination   [] Blood in urine Skin:  [] Rashes   [] Ulcers  Psychological:  [] History of anxiety   []  History of major depression.  Physical Examination  There were no vitals filed for this visit. There is no height or weight on file to calculate BMI. Gen: WD/WN, NAD Head: [place]/AT, No temporalis wasting.  Ear/Nose/Throat: Hearing grossly intact, nares w/o erythema or drainage Eyes: PER,  EOMI, sclera nonicteric.  Neck: Supple, no masses.  No bruit or JVD.  Pulmonary:  Good air movement, no audible wheezing, no use of accessory muscles.  Cardiac: RRR, normal S1, S2, no Murmurs. Vascular:  mild trophic changes, no open wounds Vessel Right Left  Radial Palpable Palpable  PT Not Palpable Not Palpable  DP Not Palpable Not Palpable  Gastrointestinal: soft, non-distended. No guarding/no peritoneal signs.  Musculoskeletal: M/S 5/5 throughout.  No visible deformity.  Neurologic: CN 2-12 intact. Pain and light touch intact in extremities.  Symmetrical.  Speech is fluent. Motor exam as listed above. Psychiatric: Judgment intact, Mood & affect appropriate for pt's clinical situation. Dermatologic: No rashes or ulcers noted.  No changes consistent with cellulitis.   CBC Lab Results  Component Value Date   WBC 9.7 03/29/2023   HGB 14.6 03/29/2023   HCT 44.6 03/29/2023   MCV 96.2 03/29/2023   PLT 220.0 03/29/2023    BMET    Component Value Date/Time   NA 140 03/29/2023 1103   K 3.7 03/29/2023 1103   CL 103 03/29/2023 1103   CO2 30 03/29/2023 1103   GLUCOSE 109 (H) 03/29/2023 1103   BUN 14 03/29/2023 1103   CREATININE 1.20 03/29/2023 1103   CALCIUM  9.3 03/29/2023 1103   GFRNONAA 55 (L) 07/26/2022 1208   GFRAA >60 07/13/2018 0425   CrCl cannot be calculated (Patient's most recent lab result is older than the maximum 21 days allowed.).  COAG Lab Results  Component Value Date   INR 1.0 07/03/2018    Radiology No results found.   Assessment/Plan 1. 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  The patient does not voice lifestyle limiting changes at this point in time.  Noninvasive studies do not suggest clinically significant change.  No invasive studies, angiography or surgery at this time The patient should  continue walking and begin a more formal exercise program.  The patient should continue antiplatelet therapy and aggressive treatment of the lipid abnormalities  No changes in the patient's medications at this time  Continued surveillance is indicated as atherosclerosis is likely to progress with time.    The patient will continue follow up with noninvasive studies as ordered.  - VAS US  AORTA/IVC/ILIACS; Future - VAS US  ABI WITH/WO TBI; Future  2. Abdominal aneurysm (HCC) Recommend: No surgery or intervention is indicated at this time.   The patient has an asymptomatic abdominal aortic aneurysm that is less than 4 cm in maximal diameter.     I have reviewed the natural history of abdominal aortic aneurysm and the small risk of rupture for aneurysm less than 5 cm in size.   However, as these small aneurysms tend to enlarge over time, continued surveillance with ultrasound or CT scan is mandatory.    I have also discussed optimizing medical management with hypertension and lipid control and the negative effect that any tobacco products have on aneurysmal disease.  The patient is also encouraged to exercise a minimum of 30 minutes 4 times a week.    Should the patient develop new onset abdominal or back pain or signs of peripheral embolization they are instructed to seek medical attention immediately and to alert the physician providing care that they have an aneurysm.    The patient voices their understanding.   The patient will return in 12 months with an aortic duplex. - VAS US  AORTA/IVC/ILIACS; Future  3. Thoracic aortic aneurysm without rupture, unspecified part (HCC) Recommend:  No surgery or intervention is indicated at this time.  The patient has an asymptomatic thoracic aortic aneurysm that is less than 6.0 cm in maximal diameter.  I have discussed the natural history of thoracic aortic aneurysm and the small risk of rupture for aneurysm less than 6.5 cm in size.  However, as  these small aneurysms tend to enlarge over time, continued surveillance with CT scan is mandatory.   I have also discussed optimizing medical management with hypertension and lipid control and the negative effect that any tobacco products have on aneurysmal disease.  The patient is also encouraged to exercise a minimum of 30 minutes 4 times a week.   Should the patient develop new onset chest or back pain or signs of peripheral embolization they are instructed to seek medical attention immediately and to alert the physician providing care that they have an aneurysm in the chest.   The patient voices their understanding.  The patient will return as ordered with a CT scan of the chest  4. Carotid stenosis, bilateral Recommend:   Given the patient's asymptomatic subcritical stenosis no further invasive testing or surgery at this time.   Duplex ultrasound shows 1 to 39% stenosis of the right ICA with 40 to 59% stenosis of the left ICA   Continue antiplatelet therapy as prescribed Continue management of CAD, HTN and Hyperlipidemia Healthy heart diet,  encouraged exercise at least 4 times per week   Follow up in 12 months with duplex ultrasound and physical exam   5. Atherosclerosis of native coronary artery of native heart without angina pectoris Continue cardiac and antihypertensive medications as already ordered and reviewed, no changes at this time.  Continue statin as ordered and reviewed, no changes at this time  Nitrates PRN for chest pain    Cordella Shawl, MD  11/26/2023 3:59 PM

## 2023-11-27 ENCOUNTER — Ambulatory Visit (INDEPENDENT_AMBULATORY_CARE_PROVIDER_SITE_OTHER): Admitting: Vascular Surgery

## 2023-11-27 ENCOUNTER — Encounter (INDEPENDENT_AMBULATORY_CARE_PROVIDER_SITE_OTHER): Payer: Self-pay | Admitting: Vascular Surgery

## 2023-11-27 VITALS — BP 124/82 | HR 110 | Ht 66.0 in | Wt 134.2 lb

## 2023-11-27 DIAGNOSIS — I70223 Atherosclerosis of native arteries of extremities with rest pain, bilateral legs: Secondary | ICD-10-CM

## 2023-11-27 DIAGNOSIS — I6523 Occlusion and stenosis of bilateral carotid arteries: Secondary | ICD-10-CM

## 2023-11-27 DIAGNOSIS — I714 Abdominal aortic aneurysm, without rupture, unspecified: Secondary | ICD-10-CM | POA: Diagnosis not present

## 2023-11-27 DIAGNOSIS — I2510 Atherosclerotic heart disease of native coronary artery without angina pectoris: Secondary | ICD-10-CM | POA: Diagnosis not present

## 2023-11-27 DIAGNOSIS — I712 Thoracic aortic aneurysm, without rupture, unspecified: Secondary | ICD-10-CM

## 2023-12-02 ENCOUNTER — Encounter (INDEPENDENT_AMBULATORY_CARE_PROVIDER_SITE_OTHER): Payer: Self-pay | Admitting: Vascular Surgery

## 2023-12-14 ENCOUNTER — Telehealth: Payer: Self-pay

## 2023-12-14 NOTE — Telephone Encounter (Signed)
 Called pt and she stated that she went to bed early this am and would give us  a call back to get CPE scheduled.  Please schedule physical for pt when she calls back.

## 2023-12-27 DIAGNOSIS — H25041 Posterior subcapsular polar age-related cataract, right eye: Secondary | ICD-10-CM | POA: Diagnosis not present

## 2023-12-27 DIAGNOSIS — H0100A Unspecified blepharitis right eye, upper and lower eyelids: Secondary | ICD-10-CM | POA: Diagnosis not present

## 2023-12-27 DIAGNOSIS — H25042 Posterior subcapsular polar age-related cataract, left eye: Secondary | ICD-10-CM | POA: Diagnosis not present

## 2023-12-27 DIAGNOSIS — H2513 Age-related nuclear cataract, bilateral: Secondary | ICD-10-CM | POA: Diagnosis not present

## 2023-12-27 DIAGNOSIS — H25043 Posterior subcapsular polar age-related cataract, bilateral: Secondary | ICD-10-CM | POA: Diagnosis not present

## 2024-01-09 DIAGNOSIS — H2511 Age-related nuclear cataract, right eye: Secondary | ICD-10-CM | POA: Diagnosis not present

## 2024-01-09 DIAGNOSIS — H2512 Age-related nuclear cataract, left eye: Secondary | ICD-10-CM | POA: Diagnosis not present

## 2024-01-17 ENCOUNTER — Encounter: Payer: Self-pay | Admitting: Ophthalmology

## 2024-01-17 NOTE — Anesthesia Preprocedure Evaluation (Addendum)
 Anesthesia Evaluation  Patient identified by MRN, date of birth, ID band Patient awake    Reviewed: Allergy & Precautions, NPO status , Patient's Chart, lab work & pertinent test results  History of Anesthesia Complications Negative for: history of anesthetic complications  Airway Mallampati: I   Neck ROM: Full    Dental  (+) Missing   Pulmonary COPD, Current Smoker (1 ppd)Patient did not abstain from smoking.   Pulmonary exam normal breath sounds clear to auscultation       Cardiovascular hypertension, + Peripheral Vascular Disease (s/p R CEA; ascending thoracic aneurysm stable at 4.3 cm as of 08/30/23) and +CHF (preserved EF)  + dysrhythmias (a fib on Eliquis ; 2nd degree AVB) + Valvular Problems/Murmurs (moderate AS)  Rhythm:Regular Rate:Normal + Systolic murmurs    Neuro/Psych  PSYCHIATRIC DISORDERS Anxiety Depression    negative neurological ROS     GI/Hepatic ,GERD  ,,  Endo/Other  negative endocrine ROS    Renal/GU negative Renal ROS     Musculoskeletal   Abdominal   Peds  Hematology negative hematology ROS (+)   Anesthesia Other Findings   Reproductive/Obstetrics                              Anesthesia Physical Anesthesia Plan  ASA: 4  Anesthesia Plan: MAC   Post-op Pain Management:    Induction: Intravenous  PONV Risk Score and Plan: 1 and Treatment may vary due to age or medical condition, Midazolam  and TIVA  Airway Management Planned: Natural Airway and Nasal Cannula  Additional Equipment:   Intra-op Plan:   Post-operative Plan:   Informed Consent: I have reviewed the patients History and Physical, chart, labs and discussed the procedure including the risks, benefits and alternatives for the proposed anesthesia with the patient or authorized representative who has indicated his/her understanding and acceptance.   Patient has DNR.  Discussed DNR with patient and  Suspend DNR.   Dental advisory given  Plan Discussed with: CRNA  Anesthesia Plan Comments: (LMA/GETA backup discussed.  Patient consented for risks of anesthesia including but not limited to:  - adverse reactions to medications - damage to eyes, teeth, lips or other oral mucosa - nerve damage due to positioning  - sore throat or hoarseness - damage to heart, brain, nerves, lungs, other parts of body or loss of life  Informed patient about role of CRNA in peri- and intra-operative care.  Patient voiced understanding.)         Anesthesia Quick Evaluation

## 2024-01-22 ENCOUNTER — Other Ambulatory Visit: Payer: Self-pay | Admitting: Family

## 2024-01-22 DIAGNOSIS — J449 Chronic obstructive pulmonary disease, unspecified: Secondary | ICD-10-CM

## 2024-01-24 NOTE — Discharge Instructions (Signed)

## 2024-01-25 ENCOUNTER — Ambulatory Visit: Payer: Self-pay | Admitting: Anesthesiology

## 2024-01-25 ENCOUNTER — Encounter: Payer: Self-pay | Admitting: Family

## 2024-01-25 ENCOUNTER — Other Ambulatory Visit: Payer: Self-pay

## 2024-01-25 ENCOUNTER — Encounter
Admission: RE | Disposition: A | Discharge status: Home / Self Care | Payer: Self-pay | Source: Home / Self Care | Attending: Ophthalmology

## 2024-01-25 ENCOUNTER — Ambulatory Visit
Admission: RE | Admit: 2024-01-25 | Discharge: 2024-01-25 | Disposition: A | Discharge status: Home / Self Care | Attending: Ophthalmology | Admitting: Ophthalmology

## 2024-01-25 ENCOUNTER — Encounter: Payer: Self-pay | Admitting: Ophthalmology

## 2024-01-25 DIAGNOSIS — H25042 Posterior subcapsular polar age-related cataract, left eye: Secondary | ICD-10-CM | POA: Diagnosis present

## 2024-01-25 DIAGNOSIS — H2512 Age-related nuclear cataract, left eye: Secondary | ICD-10-CM | POA: Diagnosis not present

## 2024-01-25 DIAGNOSIS — F17200 Nicotine dependence, unspecified, uncomplicated: Secondary | ICD-10-CM | POA: Diagnosis not present

## 2024-01-25 DIAGNOSIS — I441 Atrioventricular block, second degree: Secondary | ICD-10-CM | POA: Insufficient documentation

## 2024-01-25 DIAGNOSIS — K219 Gastro-esophageal reflux disease without esophagitis: Secondary | ICD-10-CM | POA: Diagnosis not present

## 2024-01-25 DIAGNOSIS — I110 Hypertensive heart disease with heart failure: Secondary | ICD-10-CM | POA: Insufficient documentation

## 2024-01-25 DIAGNOSIS — I4891 Unspecified atrial fibrillation: Secondary | ICD-10-CM | POA: Diagnosis not present

## 2024-01-25 DIAGNOSIS — I4819 Other persistent atrial fibrillation: Secondary | ICD-10-CM | POA: Diagnosis not present

## 2024-01-25 DIAGNOSIS — J449 Chronic obstructive pulmonary disease, unspecified: Secondary | ICD-10-CM | POA: Diagnosis not present

## 2024-01-25 DIAGNOSIS — H25812 Combined forms of age-related cataract, left eye: Secondary | ICD-10-CM | POA: Diagnosis not present

## 2024-01-25 DIAGNOSIS — Z7901 Long term (current) use of anticoagulants: Secondary | ICD-10-CM | POA: Diagnosis not present

## 2024-01-25 DIAGNOSIS — Z7189 Other specified counseling: Secondary | ICD-10-CM

## 2024-01-25 DIAGNOSIS — I5032 Chronic diastolic (congestive) heart failure: Secondary | ICD-10-CM | POA: Diagnosis not present

## 2024-01-25 HISTORY — DX: Abdominal aortic aneurysm, without rupture, unspecified: I71.40

## 2024-01-25 HISTORY — DX: Other persistent atrial fibrillation: I48.19

## 2024-01-25 HISTORY — DX: Peripheral vascular disease, unspecified: I73.9

## 2024-01-25 HISTORY — DX: Atherosclerotic heart disease of native coronary artery without angina pectoris: I25.10

## 2024-01-25 HISTORY — DX: Supraventricular tachycardia, unspecified: I47.10

## 2024-01-25 HISTORY — DX: Occlusion and stenosis of bilateral carotid arteries: I65.23

## 2024-01-25 HISTORY — DX: Gastro-esophageal reflux disease without esophagitis: K21.9

## 2024-01-25 HISTORY — DX: Personal history of other diseases of the circulatory system: Z86.79

## 2024-01-25 HISTORY — DX: Unspecified atrial fibrillation: I48.91

## 2024-01-25 HISTORY — DX: Other specified postprocedural states: Z98.890

## 2024-01-25 HISTORY — DX: Chronic obstructive pulmonary disease, unspecified: J44.9

## 2024-01-25 HISTORY — DX: Thoracic aortic aneurysm, without rupture, unspecified: I71.20

## 2024-01-25 HISTORY — DX: Nonrheumatic aortic (valve) stenosis: I35.0

## 2024-01-25 HISTORY — DX: Calculus of gallbladder without cholecystitis without obstruction: K80.20

## 2024-01-25 HISTORY — DX: Atrioventricular block, second degree: I44.1

## 2024-01-25 HISTORY — DX: Chronic diastolic (congestive) heart failure: I50.32

## 2024-01-25 HISTORY — PX: CATARACT EXTRACTION W/PHACO: SHX586

## 2024-01-25 HISTORY — DX: Atherosclerosis of native arteries of extremities with rest pain, bilateral legs: I70.223

## 2024-01-25 SURGERY — PHACOEMULSIFICATION, CATARACT, WITH IOL INSERTION
Anesthesia: Monitor Anesthesia Care | Laterality: Left

## 2024-01-25 MED ORDER — FENTANYL CITRATE (PF) 100 MCG/2ML IJ SOLN
INTRAMUSCULAR | Status: DC | PRN
  Administered 2024-01-25: 50 ug via INTRAVENOUS

## 2024-01-25 MED ORDER — MIDAZOLAM HCL 2 MG/2ML IJ SOLN
INTRAMUSCULAR | Status: DC | PRN
  Administered 2024-01-25: 1 mg via INTRAVENOUS

## 2024-01-25 MED ORDER — TETRACAINE HCL 0.5 % OP SOLN
1.0000 [drp] | OPHTHALMIC | Status: DC | PRN
Start: 2024-01-25 — End: 2024-01-25
  Administered 2024-01-25 (×3): 1 [drp] via OPHTHALMIC

## 2024-01-25 MED ORDER — TETRACAINE HCL 0.5 % OP SOLN
OPHTHALMIC | Status: AC
  Filled 2024-01-25: qty 4

## 2024-01-25 MED ORDER — FENTANYL CITRATE (PF) 100 MCG/2ML IJ SOLN
INTRAMUSCULAR | Status: AC
  Filled 2024-01-25: qty 2

## 2024-01-25 MED ORDER — LIDOCAINE HCL (PF) 2 % IJ SOLN
INTRAOCULAR | Status: DC | PRN
  Administered 2024-01-25: 1 mL via INTRAOCULAR

## 2024-01-25 MED ORDER — SIGHTPATH DOSE#1 BSS IO SOLN
INTRAOCULAR | Status: DC | PRN
  Administered 2024-01-25: 15 mL via INTRAOCULAR

## 2024-01-25 MED ORDER — ARMC OPHTHALMIC DILATING DROPS
OPHTHALMIC | Status: AC
  Filled 2024-01-25: qty 0.5

## 2024-01-25 MED ORDER — LACTATED RINGERS IV SOLN
INTRAVENOUS | Status: DC
Start: 2024-01-25 — End: 2024-01-25

## 2024-01-25 MED ORDER — SIGHTPATH DOSE#1 NA HYALUR & NA CHOND-NA HYALUR IO KIT
PACK | INTRAOCULAR | Status: DC | PRN
  Administered 2024-01-25: 1 via OPHTHALMIC

## 2024-01-25 MED ORDER — ONDANSETRON HCL 4 MG/2ML IJ SOLN: 4.0000 mg | Freq: Once | INTRAMUSCULAR | Status: DC | PRN

## 2024-01-25 MED ORDER — ARMC OPHTHALMIC DILATING DROPS
1.0000 | OPHTHALMIC | Status: DC | PRN
Start: 2024-01-25 — End: 2024-01-25
  Administered 2024-01-25 (×3): 1 via OPHTHALMIC

## 2024-01-25 MED ORDER — BRIMONIDINE TARTRATE-TIMOLOL 0.2-0.5 % OP SOLN
OPHTHALMIC | Status: DC | PRN
  Administered 2024-01-25: 1 [drp] via OPHTHALMIC

## 2024-01-25 MED ORDER — MOXIFLOXACIN HCL 0.5 % OP SOLN
OPHTHALMIC | Status: DC | PRN
  Administered 2024-01-25: .2 mL via OPHTHALMIC

## 2024-01-25 MED ORDER — MIDAZOLAM HCL 2 MG/2ML IJ SOLN
INTRAMUSCULAR | Status: AC
  Filled 2024-01-25: qty 2

## 2024-01-25 MED ORDER — SIGHTPATH DOSE#1 BSS IO SOLN
INTRAOCULAR | Status: DC | PRN
  Administered 2024-01-25: 91 mL via OPHTHALMIC

## 2024-01-25 SURGICAL SUPPLY — 10 items
DISSECTOR HYDRO NUCLEUS 50X22 (MISCELLANEOUS) ×1 IMPLANT
DRSG TEGADERM 2-3/8X2-3/4 SM (GAUZE/BANDAGES/DRESSINGS) ×1 IMPLANT
FEE CATARACT SUITE SIGHTPATH (MISCELLANEOUS) ×1 IMPLANT
GLOVE BIOGEL PI IND STRL 8 (GLOVE) ×1 IMPLANT
GLOVE SURG LX STRL 7.5 STRW (GLOVE) ×1 IMPLANT
GLOVE SURG SYN 6.5 PF PI BL (GLOVE) ×1 IMPLANT
LENS IOL TECNIS MONO 18.0 (Intraocular Lens) IMPLANT
NDL FILTER BLUNT 18X1 1/2 (NEEDLE) ×1 IMPLANT
NEEDLE FILTER BLUNT 18X1 1/2 (NEEDLE) ×1 IMPLANT
SYR 3ML LL SCALE MARK (SYRINGE) ×1 IMPLANT

## 2024-01-25 NOTE — Anesthesia Postprocedure Evaluation (Signed)
 Anesthesia Post Note  Patient: Tracy Bell  Procedure(s) Performed: PHACOEMULSIFICATION, CATARACT, WITH IOL INSERTION 15.26, 01:37.9 (Left)  Patient location during evaluation: PACU Anesthesia Type: MAC Level of consciousness: awake and alert, oriented and patient cooperative Pain management: pain level controlled Vital Signs Assessment: post-procedure vital signs reviewed and stable Respiratory status: spontaneous breathing, nonlabored ventilation and respiratory function stable Cardiovascular status: blood pressure returned to baseline and stable Postop Assessment: adequate PO intake Anesthetic complications: no   No notable events documented.   Last Vitals:  Vitals:   01/25/24 0833 01/25/24 0836  BP:  128/82  Pulse: 80 82  Resp: 20 20  Temp:    SpO2: 95% 93%    Last Pain:  Vitals:   01/25/24 0836  TempSrc:   PainSc: 0-No pain                 Alfonso Ruths

## 2024-01-25 NOTE — Op Note (Signed)
 OPERATIVE NOTE  Tracy Bell [id number] 01/25/2024   PREOPERATIVE DIAGNOSIS: Nuclear sclerotic cataract left eye. H25.12   POSTOPERATIVE DIAGNOSIS: Nuclear sclerotic cataract left eye. H25.12   PROCEDURE:  Phacoemulsification with posterior chamber intraocular lens placement of the left eye  Ultrasound time: Procedure(s): PHACOEMULSIFICATION, CATARACT, WITH IOL INSERTION 15.26, 01:37.9 (Left)  LENS:   Implant Name Type Inv. Item Serial No. Manufacturer Lot No. LRB No. Used Action  LENS IOL TECNIS MONO 18.0 - [id number] Intraocular Lens LENS IOL TECNIS MONO 18.0 [id number] [employer]IGHTPATH  Left 1 Implanted      SURGEON:  Feliciano CHRISTELLA. [name], MD   ANESTHESIA:  Topical with tetracaine  drops, augmented with 1% preservative-free intracameral lidocaine .   COMPLICATIONS:  None.   DESCRIPTION OF PROCEDURE:  The patient was identified in the holding room and transported to the operating room and placed in the supine position under the operating microscope.  The left eye was identified as the operative eye, which was prepped and draped in the usual sterile ophthalmic fashion.   A 1 millimeter clear-corneal paracentesis was made inferotemporally. Preservative-free 1% lidocaine  mixed with 1:1,000 bisulfite-free aqueous solution of epinephrine  was injected into the anterior chamber. The anterior chamber was then filled with Viscoat viscoelastic. A 2.4 millimeter keratome was used to make a clear-corneal incision superotemporally. A curvilinear capsulorrhexis was made with a cystotome and capsulorrhexis forceps. Balanced salt solution was used to hydrodissect and hydrodelineate the nucleus. Phacoemulsification was then used to remove the lens nucleus and epinucleus. The remaining cortex was then removed using the irrigation and aspiration handpiece. Provisc was then placed into the capsular bag to distend it for lens placement. A +18.00 D [id number] intraocular lens was then injected into the capsular bag. The  remaining viscoelastic was aspirated.   Wounds were hydrated with balanced salt solution.  The anterior chamber was inflated to a physiologic pressure with balanced salt solution.  No wound leaks were noted. Moxifloxacin  was injected intracamerally.  Timolol  and Brimonidine  drops were applied to the eye.  The patient was taken to the recovery room in stable condition without complications of anesthesia or surgery.  Feliciano Hugger [place] 01/25/2024, 8:29 AM

## 2024-01-25 NOTE — H&P (Signed)
 [facility]   Primary Care Physician:  Dineen Rollene MATSU, FNP Ophthalmologist: Dr. Feliciano Ober  Pre-Procedure History & Physical: HPI:  Tracy Bell is a 73 y.o. female here for cataract surgery.   Past Medical History:  Diagnosis Date   AAA (abdominal aortic aneurysm)    yearly checkups   Afib (HCC)    Anxiety    Aortic valve stenosis, etiology of cardiac valve disease unspecified    Atherosclerosis of native artery of both lower extremities with rest pain (HCC)    Atherosclerosis of native coronary artery of native heart without angina pectoris    Carotid stenosis, bilateral    Chronic heart failure with preserved ejection fraction (HCC)    COPD (chronic obstructive pulmonary disease) (HCC)    Coronary artery calcification seen on CT scan    Cough    Depression    Dizziness    Dyspnea    Gall stone    GERD (gastroesophageal reflux disease)    Heart murmur    History of Mobitz type I block    History of right-sided carotid endarterectomy    March 2020   Hyperlipidemia    Hypertension    Lightheadedness    PAD (peripheral artery disease)    Persistent atrial fibrillation (HCC)    Personal history of tobacco use, presenting hazards to health 08/28/2015   Second degree AV block, Mobitz type I    Stage 2 moderate COPD by GOLD classification (HCC)    SVT (supraventricular tachycardia)    Thoracic aortic aneurysm without rupture    Thoracic ascending aortic aneurysm 08/2023   measured 4.3 cm    Past Surgical History:  Procedure Laterality Date   ABDOMINAL HYSTERECTOMY  1980   complete   BREAST BIOPSY  1970   normal   DILATION AND CURETTAGE OF UTERUS     ENDARTERECTOMY Right 07/12/2018   Procedure: ENDARTERECTOMY CAROTID;  Surgeon: Marea Selinda RAMAN, MD;  Location: [facility] ORS;  Service: Vascular;  Laterality: Right;   TUBAL LIGATION      Prior to Admission medications   Medication Sig Start Date End Date Taking? Authorizing Provider  albuterol  (VENTOLIN   HFA) 108 (90 Base) MCG/ACT inhaler Inhale 2 puffs into the lungs every 6 (six) hours as needed for wheezing or shortness of breath. 05/18/23  Yes Tamea Dedra CROME, MD  atorvastatin  (LIPITOR) 80 MG tablet Take 1 tablet (80 mg total) by mouth daily. 07/03/23  Yes Dineen Rollene MATSU, FNP  Biotin 5000 MCG CAPS Take by mouth.   Yes [provider]  cholecalciferol  (VITAMIN D3) 25 MCG (1000 UT) tablet Take 1,000 Units by mouth daily.   Yes [provider]  diltiazem  (CARDIZEM  CD) 360 MG 24 hr capsule TAKE 1 CAPSULE BY MOUTH EVERY DAY 06/08/23  Yes Dunn, Bernardino CHRISTELLA, PA-C  ELIQUIS  5 MG TABS tablet TAKE 1 TABLET BY MOUTH TWICE A DAY 07/28/23  Yes Arnett, Rollene MATSU, FNP  furosemide  (LASIX ) 20 MG tablet TAKE 1 TABLET (20 MG TOTAL) BY MOUTH DAILY AS NEEDED FOR EDEMA (TAKE AS NEEDED FOR SWELLING OR WEIGHT GAIN GREATER THAN 3 POUNDS OVERNIGHT). TAKE 1 TAB ONCE DAILY FOR 3 DAYS THEN AS NEEDED 10/10/23  Yes Dunn, Ryan M, PA-C  losartan  (COZAAR ) 50 MG tablet TAKE 1 TABLET BY MOUTH EVERY DAY 07/28/23  Yes Dineen Rollene MATSU, FNP  Multiple Vitamins-Minerals (CENTRUM ADULTS) TABS Take 1 tablet by mouth daily. 03/14/14  Yes Vannie Delon LABOR, MD  STIOLTO RESPIMAT  2.5-2.5 MCG/ACT AERS INHALE 2  PUFFS BY MOUTH DAILY 01/23/24   Tamea Dedra CROME, MD    Allergies as of 12/29/2023   (No Known Allergies)    Family History  Problem Relation Age of Onset   Cancer Mother        Pancreatic Cancer    Cancer Father        Stomach Cancer    Lung cancer Sister    Cancer Sister 75       ovarian   Lung cancer Sister     Social History   Socioeconomic History   Marital status: Widowed    Spouse name: Not on file   Number of children: Not on file   Years of education: Not on file   Highest education level: Not on file  Occupational History   Occupation: retired  Tobacco Use   Smoking status: Every Day    Current packs/day: 2.00    Average packs/day: 2.0 packs/day for 60.9 years (121.9 ttl pk-yrs)     Types: Cigarettes    Start date: 05/03/1963   Smokeless tobacco: Never   Tobacco comments:    1 PPD- khj 10/10/2023  Vaping Use   Vaping status: Never Used  Substance and Sexual Activity   Alcohol use: Yes    Alcohol/week: 0.0 standard drinks of alcohol    Comment: Verde Spumante occasionally   Drug use: No   Sexual activity: Not on file  Other Topics Concern   Not on file  Social History Narrative   Lives with husband. Has 11 pets in home. Has one son, lives locally.   Was born and raised in [place].   Husband is older than her and she provides care for him.      Work - retired. Invented water bottle for pets   Social Drivers of Health   Financial Resource Strain: Medium Risk (04/13/2021)   Overall Financial Resource Strain (CARDIA)    Difficulty of Paying Living Expenses: Somewhat hard  Food Insecurity: Not on file  Transportation Needs: Not on file  Physical Activity: Not on file  Stress: Not on file  Social Connections: Not on file  Intimate Partner Violence: Not on file    Review of Systems: See HPI, otherwise negative ROS  Physical Exam: Ht 5' 6 (1.676 m)   Wt 61.2 kg   BMI 21.79 kg/m  General:   Alert, cooperative in NAD Head:  Normocephalic and atraumatic. Respiratory:  Normal work of breathing. Cardiovascular:  RRR  Impression/Plan: Tracy Bell is here for cataract surgery.  Risks, benefits, limitations, and alternatives regarding cataract surgery have been reviewed with the patient.  Questions have been answered.  All parties agreeable.   Feliciano Bryan Ober, MD  01/25/2024, 7:15 AM

## 2024-01-25 NOTE — Transfer of Care (Signed)
 Immediate Anesthesia Transfer of Care Note  Patient: Tracy Bell  Procedure(s) Performed: PHACOEMULSIFICATION, CATARACT, WITH IOL INSERTION 15.26, 01:37.9 (Left)  Patient Location: PACU  Anesthesia Type: MAC  Level of Consciousness: awake, alert  and patient cooperative  Airway and Oxygen Therapy: Patient Spontanous Breathing and Patient connected to supplemental oxygen  Post-op Assessment: Post-op Vital signs reviewed, Patient's Cardiovascular Status Stable, Respiratory Function Stable, Patent Airway and No signs of Nausea or vomiting  Post-op Vital Signs: Reviewed and stable  Complications: No notable events documented.

## 2024-01-26 ENCOUNTER — Encounter: Payer: Self-pay | Admitting: Ophthalmology

## 2024-01-29 NOTE — Assessment & Plan Note (Signed)
 01/29/24 Patient is reconsidering DNR and speaking with her son. Informed her that I would REMOVE and DISCONTINUE DNR at this time.

## 2024-01-29 NOTE — Anesthesia Preprocedure Evaluation (Addendum)
 Anesthesia Evaluation  Patient identified by MRN, date of birth, ID band Patient awake    Reviewed: Allergy & Precautions, H&P , NPO status , Patient's Chart, lab work & pertinent test results  Airway Mallampati: I  TM Distance: >3 FB Neck ROM: Full    Dental no notable dental hx.    Pulmonary neg pulmonary ROS, shortness of breath, COPD, Current Smoker   Pulmonary exam normal breath sounds clear to auscultation       Cardiovascular hypertension, + CAD and + Peripheral Vascular Disease  negative cardio ROS Normal cardiovascular exam+ dysrhythmias + Valvular Problems/Murmurs  Rhythm:Regular Rate:Normal  05-16-23 echo 1. Left ventricular ejection fraction, by estimation, is 60 to 65%. The  left ventricle has normal function. The left ventricle has no regional  wall motion abnormalities. There is mild left ventricular hypertrophy.  Left ventricular diastolic parameters  are indeterminate.   2. Right ventricular systolic function is normal. The right ventricular  size is normal. There is mildly elevated pulmonary artery systolic  pressure. The estimated right ventricular systolic pressure is 41.1 mmHg.   3. Left atrial size was moderately dilated.   4. Right atrial size was moderately dilated.   5. The mitral valve is normal in structure. Mild to moderate mitral valve  regurgitation. No evidence of mitral stenosis.   6. Tricuspid valve regurgitation is mild to moderate.   7. The aortic valve is calcified. There is moderate calcification of the  aortic valve. Aortic valve regurgitation is mild. Moderate aortic valve  stenosis. Aortic valve area, by VTI measures 0.85 cm. Aortic valve mean  gradient measures 19.0 mmHg.   8. There is mild dilatation of the ascending aorta, measuring 40 mm.   9. The inferior vena cava is dilated in size with >50% respiratory  variability, suggesting right atrial pressure of 8 mmHg.    hypertension,  + Peripheral Vascular Disease (s/p R CEA; ascending thoracic aneurysm stable at 4.3 cm as of 08/30/23) and +CHF (preserved EF)  + dysrhythmias (a fib on Eliquis ; 2nd degree AVB) + Valvular Problems/Murmurs (moderate AS)  Rhythm:Regular Rate:Normal + Systolic murmurs    Neuro/Psych  PSYCHIATRIC DISORDERS Anxiety Depression    negative neurological ROS  negative psych ROS   GI/Hepatic negative GI ROS, Neg liver ROS,GERD  ,,  Endo/Other  negative endocrine ROSHypothyroidism    Renal/GU negative Renal ROS  negative genitourinary   Musculoskeletal negative musculoskeletal ROS (+)    Abdominal   Peds negative pediatric ROS (+)  Hematology negative hematology ROS (+)   Anesthesia Other Findings Previous cataract surgery 01-25-24 Dr. Shellie  Personal history of tobacco use, presenting hazards to health  Hypertension Hyperlipidemia  Heart murmur Dyspnea  COPD (chronic obstructive pulmonary disease) (HCC) Cough Dizziness Lightheadedness  Depression Anxiety  Afib (HCC) GERD (gastroesophageal reflux disease) Gall stone AAA (abdominal aortic aneurysm)  Stage 2 moderate COPD by GOLD classification (HCC) Thoracic aortic aneurysm without rupture Atherosclerosis of native artery of both lower extremities with rest pain (HCC) Atherosclerosis of native coronary artery of native heart without angina pectoris  Carotid stenosis, bilateral Persistent atrial fibrillation (HCC)  Chronic heart failure with preserved ejection fraction (HCC) Coronary artery calcification seen on CT scan Aortic valve stenosis, etiology of cardiac valve disease unspecified History of Mobitz type I block PAD (peripheral artery disease) SVT (supraventricular tachycardia)  History of right-sided carotid endarterectomy Second degree AV block, Mobitz type I Thoracic ascending aortic aneurysm    Reproductive/Obstetrics negative OB ROS  Anesthesia  Physical Anesthesia Plan  ASA: 4  Anesthesia Plan:    Post-op Pain Management:    Induction:   PONV Risk Score and Plan:   Airway Management Planned:   Additional Equipment:   Intra-op Plan:   Post-operative Plan:   Informed Consent:   Plan Discussed with:   Anesthesia Plan Comments:          Anesthesia Quick Evaluation

## 2024-02-06 NOTE — Discharge Instructions (Signed)

## 2024-02-08 ENCOUNTER — Encounter
Admission: RE | Disposition: A | Discharge status: Home / Self Care | Payer: Self-pay | Source: Home / Self Care | Attending: Ophthalmology

## 2024-02-08 ENCOUNTER — Other Ambulatory Visit: Payer: Self-pay

## 2024-02-08 ENCOUNTER — Ambulatory Visit: Payer: Self-pay | Admitting: Anesthesiology

## 2024-02-08 ENCOUNTER — Ambulatory Visit
Admission: RE | Admit: 2024-02-08 | Discharge: 2024-02-08 | Disposition: A | Discharge status: Home / Self Care | Attending: Ophthalmology | Admitting: Ophthalmology

## 2024-02-08 ENCOUNTER — Encounter: Payer: Self-pay | Admitting: Ophthalmology

## 2024-02-08 DIAGNOSIS — K219 Gastro-esophageal reflux disease without esophagitis: Secondary | ICD-10-CM | POA: Insufficient documentation

## 2024-02-08 DIAGNOSIS — Z79899 Other long term (current) drug therapy: Secondary | ICD-10-CM | POA: Diagnosis not present

## 2024-02-08 DIAGNOSIS — F419 Anxiety disorder, unspecified: Secondary | ICD-10-CM | POA: Insufficient documentation

## 2024-02-08 DIAGNOSIS — I110 Hypertensive heart disease with heart failure: Secondary | ICD-10-CM | POA: Diagnosis not present

## 2024-02-08 DIAGNOSIS — I4819 Other persistent atrial fibrillation: Secondary | ICD-10-CM | POA: Insufficient documentation

## 2024-02-08 DIAGNOSIS — I2510 Atherosclerotic heart disease of native coronary artery without angina pectoris: Secondary | ICD-10-CM | POA: Insufficient documentation

## 2024-02-08 DIAGNOSIS — H2511 Age-related nuclear cataract, right eye: Secondary | ICD-10-CM | POA: Insufficient documentation

## 2024-02-08 DIAGNOSIS — Z9842 Cataract extraction status, left eye: Secondary | ICD-10-CM | POA: Insufficient documentation

## 2024-02-08 DIAGNOSIS — I5032 Chronic diastolic (congestive) heart failure: Secondary | ICD-10-CM | POA: Insufficient documentation

## 2024-02-08 DIAGNOSIS — R0602 Shortness of breath: Secondary | ICD-10-CM | POA: Insufficient documentation

## 2024-02-08 DIAGNOSIS — I739 Peripheral vascular disease, unspecified: Secondary | ICD-10-CM | POA: Insufficient documentation

## 2024-02-08 DIAGNOSIS — Z961 Presence of intraocular lens: Secondary | ICD-10-CM | POA: Insufficient documentation

## 2024-02-08 DIAGNOSIS — F17210 Nicotine dependence, cigarettes, uncomplicated: Secondary | ICD-10-CM | POA: Insufficient documentation

## 2024-02-08 DIAGNOSIS — J449 Chronic obstructive pulmonary disease, unspecified: Secondary | ICD-10-CM | POA: Insufficient documentation

## 2024-02-08 DIAGNOSIS — E785 Hyperlipidemia, unspecified: Secondary | ICD-10-CM | POA: Diagnosis not present

## 2024-02-08 DIAGNOSIS — H25041 Posterior subcapsular polar age-related cataract, right eye: Secondary | ICD-10-CM | POA: Diagnosis not present

## 2024-02-08 DIAGNOSIS — Z7901 Long term (current) use of anticoagulants: Secondary | ICD-10-CM | POA: Diagnosis not present

## 2024-02-08 DIAGNOSIS — F32A Depression, unspecified: Secondary | ICD-10-CM | POA: Insufficient documentation

## 2024-02-08 DIAGNOSIS — I4891 Unspecified atrial fibrillation: Secondary | ICD-10-CM | POA: Diagnosis not present

## 2024-02-08 HISTORY — PX: CATARACT EXTRACTION W/PHACO: SHX586

## 2024-02-08 SURGERY — PHACOEMULSIFICATION, CATARACT, WITH IOL INSERTION
Anesthesia: Topical | Site: Eye | Laterality: Right

## 2024-02-08 MED ORDER — TETRACAINE HCL 0.5 % OP SOLN
1.0000 [drp] | OPHTHALMIC | Status: DC | PRN
  Administered 2024-02-08 (×3): 1 [drp] via OPHTHALMIC

## 2024-02-08 MED ORDER — TETRACAINE HCL 0.5 % OP SOLN
OPHTHALMIC | Status: AC
  Filled 2024-02-08: qty 4

## 2024-02-08 MED ORDER — FENTANYL CITRATE (PF) 100 MCG/2ML IJ SOLN
INTRAMUSCULAR | Status: AC
  Filled 2024-02-08: qty 2

## 2024-02-08 MED ORDER — ARMC OPHTHALMIC DILATING DROPS
OPHTHALMIC | Status: AC
  Filled 2024-02-08: qty 0.5

## 2024-02-08 MED ORDER — FENTANYL CITRATE (PF) 100 MCG/2ML IJ SOLN
INTRAMUSCULAR | Status: DC | PRN
  Administered 2024-02-08: 50 ug via INTRAVENOUS

## 2024-02-08 MED ORDER — SIGHTPATH DOSE#1 SODIUM HYALURONATE 10 MG/ML IO SOLUTION
PREFILLED_SYRINGE | INTRAOCULAR | Status: DC | PRN
Start: 2024-02-08 — End: 2024-02-08
  Administered 2024-02-08: .4 mL via INTRAOCULAR

## 2024-02-08 MED ORDER — MIDAZOLAM HCL 2 MG/2ML IJ SOLN
INTRAMUSCULAR | Status: AC
  Filled 2024-02-08: qty 2

## 2024-02-08 MED ORDER — SIGHTPATH DOSE#1 BSS IO SOLN
INTRAOCULAR | Status: DC | PRN
  Administered 2024-02-08: 121 mL via OPHTHALMIC

## 2024-02-08 MED ORDER — LIDOCAINE HCL (PF) 2 % IJ SOLN
INTRAOCULAR | Status: DC | PRN
  Administered 2024-02-08: 4 mL via INTRAOCULAR

## 2024-02-08 MED ORDER — MOXIFLOXACIN HCL 0.5 % OP SOLN
OPHTHALMIC | Status: DC | PRN
  Administered 2024-02-08: .2 mL via OPHTHALMIC

## 2024-02-08 MED ORDER — MIDAZOLAM HCL 2 MG/2ML IJ SOLN
INTRAMUSCULAR | Status: DC | PRN
Start: 2024-02-08 — End: 2024-02-08
  Administered 2024-02-08 (×2): 1 mg via INTRAVENOUS

## 2024-02-08 MED ORDER — LACTATED RINGERS IV SOLN: INTRAVENOUS | Status: DC

## 2024-02-08 MED ORDER — ARMC OPHTHALMIC DILATING DROPS
1.0000 | OPHTHALMIC | Status: DC | PRN
Start: 2024-02-08 — End: 2024-02-08
  Administered 2024-02-08 (×3): 1 via OPHTHALMIC

## 2024-02-08 MED ORDER — SIGHTPATH DOSE#1 NA HYALUR & NA CHOND-NA HYALUR IO KIT
PACK | INTRAOCULAR | Status: DC | PRN
  Administered 2024-02-08: 1 via OPHTHALMIC

## 2024-02-08 MED ORDER — SIGHTPATH DOSE#1 BSS IO SOLN
INTRAOCULAR | Status: DC | PRN
  Administered 2024-02-08: 15 mL via INTRAOCULAR

## 2024-02-08 MED ORDER — BRIMONIDINE TARTRATE-TIMOLOL 0.2-0.5 % OP SOLN
OPHTHALMIC | Status: DC | PRN
  Administered 2024-02-08: 1 [drp] via OPHTHALMIC

## 2024-02-08 SURGICAL SUPPLY — 11 items
DISSECTOR HYDRO NUCLEUS 50X22 (MISCELLANEOUS) ×1 IMPLANT
DRSG TEGADERM 2-3/8X2-3/4 SM (GAUZE/BANDAGES/DRESSINGS) ×1 IMPLANT
FEE CATARACT SUITE SIGHTPATH (MISCELLANEOUS) ×1 IMPLANT
GLOVE BIOGEL PI IND STRL 8 (GLOVE) ×1 IMPLANT
GLOVE SURG LX STRL 7.5 STRW (GLOVE) ×1 IMPLANT
GLOVE SURG SYN 6.5 PF PI BL (GLOVE) ×1 IMPLANT
LENS IOL ACRSF MP 17.5 (Intraocular Lens) IMPLANT
LENS IOL TECNIS MONO 18.5 (Intraocular Lens) IMPLANT
NDL FILTER BLUNT 18X1 1/2 (NEEDLE) ×1 IMPLANT
NEEDLE FILTER BLUNT 18X1 1/2 (NEEDLE) ×1 IMPLANT
SYR 3ML LL SCALE MARK (SYRINGE) ×1 IMPLANT

## 2024-02-08 NOTE — Transfer of Care (Signed)
 Immediate Anesthesia Transfer of Care Note  Patient: Tracy Bell  Procedure(s) Performed: PHACOEMULSIFICATION, CATARACT, WITH IOL INSERTION 19.60 01:48.1 (Right: Eye)  Patient Location: PACU  Anesthesia Type: No value filed.  Level of Consciousness: awake, alert  and patient cooperative  Airway and Oxygen Therapy: Patient Spontanous Breathing   Post-op Assessment: Post-op Vital signs reviewed, Patient's Cardiovascular Status Stable, Respiratory Function Stable, Patent Airway and No signs of Nausea or vomiting  Post-op Vital Signs: Reviewed and stable  Complications: No notable events documented.

## 2024-02-08 NOTE — Op Note (Signed)
 OPERATIVE NOTE  RAJVI ARMENTOR [id number] 02/08/2024   PREOPERATIVE DIAGNOSIS: Nuclear sclerotic cataract right eye. H25.11   POSTOPERATIVE DIAGNOSIS: Nuclear sclerotic cataract right eye. H25.11   PROCEDURE:  Phacoemulsification with posterior chamber intraocular lens placement of the right eye  Ultrasound time: Procedure(s): PHACOEMULSIFICATION, CATARACT, WITH IOL INSERTION 19.60 01:48.1 (Right)  LENS:   Implant Name Type Inv. Item Serial No. Manufacturer Lot No. LRB No. Used Action  LENS IOL TECNIS MONO 18.5 - [id number] Intraocular Lens LENS IOL TECNIS MONO 18.5 [id number] [employer]  Right 1 Wasted  LENS IOL ACRSF MP 17.5 - [id number] Intraocular Lens LENS IOL ACRSF MP 17.5 [id number] [employer]  Right 1 Implanted      SURGEON:  Feliciano CHRISTELLA. [name], MD   ANESTHESIA:  Topical with tetracaine  drops, augmented with 1% preservative-free intracameral lidocaine .   COMPLICATIONS:  None.   DESCRIPTION OF PROCEDURE:  The patient was identified in the holding room and transported to the operating room and placed in the supine position under the operating microscope.  The right eye was identified as the operative eye, which was prepped and draped in the usual sterile ophthalmic fashion.   A 1 millimeter clear-corneal paracentesis was made superotemporally. Preservative-free 1% lidocaine  mixed with 1:1,000 bisulfite-free aqueous solution of epinephrine  was injected into the anterior chamber. The anterior chamber was then filled with Viscoat viscoelastic. A 2.4 millimeter keratome was used to make a clear-corneal incision inferotemporally. A curvilinear capsulorrhexis was made with a cystotome and capsulorrhexis forceps. Balanced salt solution was used to hydrodissect and hydrodelineate the nucleus. Phacoemulsification was then used to remove the lens nucleus and epinucleus. The remaining cortex was then removed using the irrigation and aspiration handpiece. Significant zonulopathy was noted  superiorly, so it was decided to place a 3-piece IOL. Provisc was then placed into the capsular bag to distend it for lens  optic placement and also placed in the sulcus for lens haptic placement. The main incision was enlarged for 3 piece insertion.  A +17.50 D [id number] intraocular lens was then injected and optic captured with haptics 90 degrees away from the area of zonulopathy. The remaining viscoelastic was aspirated. No vitreous prolapse was appreciated.   Wounds were hydrated with balanced salt solution.  The anterior chamber was inflated to a physiologic pressure with balanced salt solution.  No wound leaks were noted. Moxifloxacin  was injected intracamerally.  Timolol  and Brimonidine  drops were applied to the eye.  The patient was taken to the recovery room in stable condition without complications of anesthesia or surgery.  [employer] 02/08/2024, 9:12 AM

## 2024-02-08 NOTE — Anesthesia Postprocedure Evaluation (Signed)
 Anesthesia Post Note  Patient: Samariyah M Garner  Procedure(s) Performed: PHACOEMULSIFICATION, CATARACT, WITH IOL INSERTION 19.60 01:48.1 (Right: Eye)  Patient location during evaluation: PACU Anesthesia Type: MAC Level of consciousness: awake and alert Pain management: pain level controlled Vital Signs Assessment: post-procedure vital signs reviewed and stable Respiratory status: spontaneous breathing, nonlabored ventilation, respiratory function stable and patient connected to nasal cannula oxygen Cardiovascular status: stable and blood pressure returned to baseline Postop Assessment: no apparent nausea or vomiting Anesthetic complications: no   No notable events documented.   Last Vitals:  Vitals:   02/08/24 0914 02/08/24 0919  BP: 110/82 130/86  Pulse: 80 86  Resp: 16 18  Temp: (!) 36.4 C (!) 36.4 C  SpO2: 99% 97%    Last Pain:  Vitals:   02/08/24 0919  TempSrc:   PainSc: 0-No pain                 Beronica Lansdale C Takerra Lupinacci

## 2024-02-08 NOTE — H&P (Signed)
 [facility]   Primary Care Physician:  Dineen Rollene MATSU, FNP Ophthalmologist: Dr. Feliciano Ober  Pre-Procedure History & Physical: HPI:  Tracy Bell is a 73 y.o. female here for cataract surgery.   Past Medical History:  Diagnosis Date   AAA (abdominal aortic aneurysm)    yearly checkups   Afib (HCC)    Anxiety    Aortic valve stenosis, etiology of cardiac valve disease unspecified    Atherosclerosis of native artery of both lower extremities with rest pain (HCC)    Atherosclerosis of native coronary artery of native heart without angina pectoris    Carotid stenosis, bilateral    Chronic heart failure with preserved ejection fraction (HCC)    COPD (chronic obstructive pulmonary disease) (HCC)    Coronary artery calcification seen on CT scan    Cough    Depression    Dizziness    Dyspnea    Gall stone    GERD (gastroesophageal reflux disease)    Heart murmur    History of Mobitz type I block    History of right-sided carotid endarterectomy    March 2020   Hyperlipidemia    Hypertension    Lightheadedness    PAD (peripheral artery disease)    Persistent atrial fibrillation (HCC)    Personal history of tobacco use, presenting hazards to health 08/28/2015   Second degree AV block, Mobitz type I    Stage 2 moderate COPD by GOLD classification (HCC)    SVT (supraventricular tachycardia)    Thoracic aortic aneurysm without rupture    Thoracic ascending aortic aneurysm 08/2023   measured 4.3 cm    Past Surgical History:  Procedure Laterality Date   ABDOMINAL HYSTERECTOMY  1980   complete   BREAST BIOPSY  1970   normal   CATARACT EXTRACTION W/PHACO Left 01/25/2024   Procedure: PHACOEMULSIFICATION, CATARACT, WITH IOL INSERTION 15.26, 01:37.9;  Surgeon: Ober Feliciano Hugger, MD;  Location: [facility];  Service: Ophthalmology;  Laterality: Left;   DILATION AND CURETTAGE OF UTERUS     ENDARTERECTOMY Right 07/12/2018   Procedure: ENDARTERECTOMY  CAROTID;  Surgeon: Marea Selinda RAMAN, MD;  Location: [facility];  Service: Vascular;  Laterality: Right;   TUBAL LIGATION      Prior to Admission medications   Medication Sig Start Date End Date Taking? Authorizing Provider  albuterol  (VENTOLIN  HFA) 108 (90 Base) MCG/ACT inhaler Inhale 2 puffs into the lungs every 6 (six) hours as needed for wheezing or shortness of breath. 05/18/23   Tamea Dedra CROME, MD  atorvastatin  (LIPITOR) 80 MG tablet Take 1 tablet (80 mg total) by mouth daily. 07/03/23   Dineen Rollene MATSU, FNP  Biotin 5000 MCG CAPS Take by mouth.    [provider]  cholecalciferol  (VITAMIN D3) 25 MCG (1000 UT) tablet Take 1,000 Units by mouth daily.    [provider]  diltiazem  (CARDIZEM  CD) 360 MG 24 hr capsule TAKE 1 CAPSULE BY MOUTH EVERY DAY 06/08/23   Abigail Bernardino CHRISTELLA, PA-C  ELIQUIS  5 MG TABS tablet TAKE 1 TABLET BY MOUTH TWICE A DAY 07/28/23   Dineen Rollene MATSU, FNP  furosemide  (LASIX ) 20 MG tablet TAKE 1 TABLET (20 MG TOTAL) BY MOUTH DAILY AS NEEDED FOR EDEMA (TAKE AS NEEDED FOR SWELLING OR WEIGHT GAIN GREATER THAN 3 POUNDS OVERNIGHT). TAKE 1 TAB ONCE DAILY FOR 3 DAYS THEN AS NEEDED 10/10/23   Abigail Bernardino CHRISTELLA, PA-C  losartan  (COZAAR ) 50 MG tablet TAKE 1 TABLET BY MOUTH EVERY DAY  07/28/23   Dineen Rollene MATSU, FNP  Multiple Vitamins-Minerals (CENTRUM ADULTS) TABS Take 1 tablet by mouth daily. 03/14/14   Vannie Delon LABOR, MD  STIOLTO RESPIMAT  2.5-2.5 MCG/ACT AERS INHALE 2 PUFFS BY MOUTH DAILY 01/23/24   Tamea Dedra CROME, MD    Allergies as of 12/29/2023   (No Known Allergies)    Family History  Problem Relation Age of Onset   Cancer Mother        Pancreatic Cancer    Cancer Father        Stomach Cancer    Lung cancer Sister    Cancer Sister 58       ovarian   Lung cancer Sister     Social History   Socioeconomic History   Marital status: Widowed    Spouse name: Not on file   Number of children: Not on file   Years of education: Not on file   Highest  education level: Not on file  Occupational History   Occupation: retired  Tobacco Use   Smoking status: Every Day    Current packs/day: 2.00    Average packs/day: 2.0 packs/day for 61.0 years (121.9 ttl pk-yrs)    Types: Cigarettes    Start date: 05/03/1963   Smokeless tobacco: Never   Tobacco comments:    1 PPD- khj 10/10/2023  Vaping Use   Vaping status: Never Used  Substance and Sexual Activity   Alcohol use: Yes    Alcohol/week: 0.0 standard drinks of alcohol    Comment: Verde Spumante occasionally   Drug use: No   Sexual activity: Not on file  Other Topics Concern   Not on file  Social History Narrative   Lives with husband. Has 11 pets in home. Has one son, lives locally.   Was born and raised in [place].   Husband is older than her and she provides care for him.      Work - retired. Invented water bottle for pets   Social Drivers of Health   Financial Resource Strain: Medium Risk (04/13/2021)   Overall Financial Resource Strain (CARDIA)    Difficulty of Paying Living Expenses: Somewhat hard  Food Insecurity: Not on file  Transportation Needs: Not on file  Physical Activity: Not on file  Stress: Not on file  Social Connections: Not on file  Intimate Partner Violence: Not on file    Review of Systems: See HPI, otherwise negative ROS  Physical Exam: There were no vitals taken for this visit. General:   Alert, cooperative in NAD Head:  Normocephalic and atraumatic. Respiratory:  Normal work of breathing. Cardiovascular:  RRR  Impression/Plan: Tracy Bell is here for cataract surgery.  Risks, benefits, limitations, and alternatives regarding cataract surgery have been reviewed with the patient.  Questions have been answered.  All parties agreeable.   Feliciano Bryan Ober, MD  02/08/2024, 7:10 AM

## 2024-03-04 ENCOUNTER — Ambulatory Visit: Admitting: Physician Assistant

## 2024-03-08 ENCOUNTER — Telehealth: Payer: Self-pay

## 2024-03-08 NOTE — Telephone Encounter (Signed)
 PAP: Patient assistance application for Stioloto through [employer]) has been mailed to pt's home address on file. Provider portion of application will be faxed to provider's office.

## 2024-03-11 ENCOUNTER — Telehealth: Payer: Self-pay

## 2024-03-11 NOTE — Telephone Encounter (Signed)
 Patient assistance form placed in provider folder

## 2024-03-12 NOTE — Telephone Encounter (Signed)
 Faxed 03/12/24

## 2024-03-14 ENCOUNTER — Telehealth: Payer: Self-pay

## 2024-03-14 NOTE — Telephone Encounter (Signed)
 Patient is overdue for an appointment. Spoke with patient who said she was not at calendar and would call us  back to schedule.

## 2024-03-15 NOTE — Telephone Encounter (Signed)
 Received provider portion PAP application (BI) stiolto

## 2024-03-21 NOTE — Telephone Encounter (Signed)
 PAP: Application for Stioloto has been submitted to [employer]), via fax

## 2024-04-02 NOTE — Telephone Encounter (Signed)
 Submitted POI with letter of missing information to [facility] for review for PAP application

## 2024-04-04 ENCOUNTER — Other Ambulatory Visit: Payer: Self-pay | Admitting: Family

## 2024-04-04 NOTE — Telephone Encounter (Signed)
 Called pt to get scheduled as she has not been seen since 03/2023 pt stated she will call back in an hour to schedule

## 2024-04-05 ENCOUNTER — Ambulatory Visit: Admitting: Pulmonary Disease

## 2024-04-05 NOTE — Telephone Encounter (Signed)
 PAP: Patient assistance application for [employer]iol[employer] has been approved by P[employer] Companies: [employer] from 05/02/2024 to 05/01/2025. Medication should be delivered to PAP Delivery: Home. For further shipping updates, please contact [employer] at [phone]. Patient ID is: [id number]

## 2024-04-08 ENCOUNTER — Encounter: Payer: Self-pay | Admitting: Family

## 2024-04-11 NOTE — Progress Notes (Unsigned)
 Cardiology Office Note    Date:  04/17/2024   ID:  Tracy Bell, Kaigler 1950-09-07, MRN [id number]  PCP:  Dineen Rollene MATSU, FNP  Cardiologist:  Deatrice Cage, MD  Electrophysiologist:  None   Chief Complaint: Follow up  History of Present Illness:   Tracy Bell is a 73 y.o. female with history of coronary artery calcification noted on CT imaging, persistent A-fib, atrial flutter diagnosed in 12/2019, SVT, second-degree AV block type I, mild to moderate aortic stenosis, ascending thoracic aortic aneurysm measuring 4.3 cm in 08/2023, AAA followed by vascular surgery, carotid artery disease status post right-sided CEA in 07/2018, PAD, COPD, HTN, HLD, pulmonary nodules, and ongoing tobacco use who presents for follow-up of coronary artery calcification, A-fib, and aortic stenosis.  She was previously followed by Dr. Ammon with CT of the lungs showing evidence of coronary calcification.  Stress echo in 12/2015 showed normal LV systolic function with mild aortic stenosis and no evidence of ischemia with stress.  She had palpitations in 2019 with a 72-hour monitor showing sinus rhythm with frequent PACs and occasional brief atrial runs lasting up to 7 beats.  Echo from 04/2018 showed normal LV systolic function with mild to moderate aortic stenosis with a mean gradient of 13 mmHg and a valve area of 1.16 cm.   She transitioned her care to Dr. Cage in 05/2019 for evaluation of tachypalpitations.  Zio patch at that time showed sinus rhythm with an average rate of 65 bpm, sinus bradycardia during the early morning hours with the lowest heart rate of 43 bpm, frequent episodes of SVT totaling 100 runs with the longest episode lasting 15 minutes and 54 seconds, and intermittent Mobitz type I second-degree AV block in the early morning hours.  Echo in 2021 showed an EF of 60 to 65%, borderline LVH, grade 2 diastolic dysfunction, no regional wall motion abnormalities, normal RV systolic function, mild  tricuspid regurgitation, mild to moderate aortic stenosis with a mean gradient of 14 mmHg and a valve area of 1.4 cm.  She was diagnosed with atrial flutter in 12/2019 and initiated on apixaban .  She was noted to have converted to sinus rhythm with titration of diltiazem  in follow-up in 01/2020.    She was evaluated by her PCP on 05/04/2022 for dizziness.  She was found to be in rate controlled A-fib at that time.  Given this finding, she followed up in our office on 05/05/2022 with continued dizziness that she indicated dated back to mid December 2023.  She reported sometimes forgetting to take her second dose of apixaban .  Given missed doses of apixaban , MRI of the brain was ordered and remains pending at this time.  To quantify A-fib burden, she underwent Zio patch which showed a 100% A-fib burden with an average ventricular rate of 91 bpm with a range of 42 to 189 bpm.  Echo from 05/2022 showed an EF of 60 to 65%, no regional wall motion abnormalities, mild LVH, normal RV systolic function and ventricular cavity size, mildly elevated PASP estimated at 37.7 mmHg, moderately dilated left atrium, mild mitral regurgitation, moderate aortic valve stenosis with a mean gradient of 18 mmHg and a valve area of 1.2 cm, and an estimated right atrial pressure of 3 mmHg.  She was seen in the office in 06/2022 noting some improvement in her dizziness.  She remained in rate controlled A-fib.  She did continue to miss evening doses of apixaban  and was not interested in transitioning to rivaroxaban.  She was seen in the office on 07/26/2022 and had been more compliant with her apixaban , though still reported missing one dose every 1 to 2 weeks.  She was felt to be volume up with a weight that was up 5 pounds when compared to prior visit.  BNP 498 at that time.  She was advised to take Lasix  20 mg daily for 3 days followed by as needed thereafter.  She was seen in the office in 08/2022 and continued to decline rhythm control strategy.   She preferred to not transition off diltiazem .  She continued to smoke and was not interested in quitting.  Carotid artery ultrasound from 10/2022 showed 1 to 39% bilateral ICA stenoses with bilateral antegrade flow of the vertebral arteries and normal flow hemodynamics in the bilateral subclavian arteries.  Most recent AAA ultrasound from 10/2022 showed mild dilatation of the distal aorta proximal aorta measuring up to 3.19 cm and not aneurysmal.  She was seen in 04/2023 and reported intermittent missing of her evening dose of apixaban  1-2 times per month and declined transition to alternative anticoagulation.  She again declined rhythm control strategy.  She continued to smoke and was not interested in quitting.  Her weight was down 20 pounds when compared to her visit in 08/2022.  She indicated this was in the setting of thrush with diminished oral intake.  She was seen in the office in 06/2023 and continued to miss doses of apixaban  intermittently.  She continued to smoke.  She was largely sedentary and had several noncardiac complaints with recommendation to follow-up with PCP for these issues.  To evaluate for PAD she underwent ABI in 08/2023, that showed mild bilateral lower extremity PAD with a right ABI of 0.82 and left ABI 0.83 (previously 1.14 bilaterally).  Lower extremity arterial ultrasound at that time showed heavy calcification throughout the right lower extremity without focal stenosis with left lower extremity showing 75 to 99% stenosis in the left SFA with heavy calcification throughout the left lower extremity.  Findings are also suggestive of bilateral inflow disease, and she is followed by vascular surgery.  Lung cancer screening CT performed in 08/2023, showed a largely stable ascending thoracic aortic aneurysm measuring 4.3 cm (previously 4.2 cm in 08/2022).  She was last seen in our office in 08/2023 and was doing reasonably well from a cardiac perspective.  She was taking furosemide  4 days/week  and was more adherent to apixaban .  She was not interested in pursuing rhythm control strategy for A-fib.  She continued to smoke and was not interested in quitting.  Longstanding intermittent dizziness was stable and noticed when rotating her head from left to right.  She declined EKG and labs.  She comes in today and is doing well from a cardiac perspective and remains without symptoms of angina or cardiac decompensation.  No lower extremity swelling or progressive orthopnea.  Feels like dyspnea may be a little more noticeable.  No palpitations, falls, hematochezia, or melena.  She does continue to intermittently miss a dose of apixaban , though is doing a better job and taking this twice daily.  She indicates today she was unaware there was a once daily option despite us  talking about this at each visit with a prior declination to transition to rivaroxaban or warfarin.  Continues to take furosemide  4 days/week.  She has several noncardiac concerns at this time including issues with falling asleep/staying asleep, nasal congestion with thick mucus upon waking up in the morning, and thickened toenails.  She again declines EKG and labs indicating these will be done at her PCP's office later this month, though subsequently states that she will be canceling that PCP visit.   Labs independently reviewed: 03/2023 - potassium 3.7, BUN 14, serum creatinine 1.2, Hgb 14.6, PLT 220, TSH normal, albumin 0.0, AST normal, ALT 40 06/2021 - TC 137, TG 92, HDL 44, LDL 74  08/2020 - A1c 5.8  Past Medical History:  Diagnosis Date   AAA (abdominal aortic aneurysm)    yearly checkups   Afib (HCC)    Anxiety    Aortic valve stenosis, etiology of cardiac valve disease unspecified    Atherosclerosis of native artery of both lower extremities with rest pain (HCC)    Atherosclerosis of native coronary artery of native heart without angina pectoris    Carotid stenosis, bilateral    Chronic heart failure with preserved  ejection fraction (HCC)    COPD (chronic obstructive pulmonary disease) (HCC)    Coronary artery calcification seen on CT scan    Cough    Depression    Dizziness    Dyspnea    Gall stone    GERD (gastroesophageal reflux disease)    Heart murmur    History of Mobitz type I block    History of right-sided carotid endarterectomy    March 2020   Hyperlipidemia    Hypertension    Lightheadedness    PAD (peripheral artery disease)    Persistent atrial fibrillation (HCC)    Personal history of tobacco use, presenting hazards to health 08/28/2015   Second degree AV block, Mobitz type I    Stage 2 moderate COPD by GOLD classification (HCC)    SVT (supraventricular tachycardia)    Thoracic aortic aneurysm without rupture    Thoracic ascending aortic aneurysm 08/2023   measured 4.3 cm    Past Surgical History:  Procedure Laterality Date   ABDOMINAL HYSTERECTOMY  1980   complete   BREAST BIOPSY  1970   normal   CATARACT EXTRACTION W/PHACO Left 01/25/2024   Procedure: PHACOEMULSIFICATION, CATARACT, WITH IOL INSERTION 15.26, 01:37.9;  Surgeon: [name] Feliciano Hugger, MD;  Location: [facility];  Service: Ophthalmology;  Laterality: Left;   CATARACT EXTRACTION W/PHACO Right 02/08/2024   Procedure: PHACOEMULSIFICATION, CATARACT, WITH IOL INSERTION 19.60 01:48.1;  Surgeon: [name] Feliciano Hugger, MD;  Location: [facility];  Service: Ophthalmology;  Laterality: Right;   DILATION AND CURETTAGE OF UTERUS     ENDARTERECTOMY Right 07/12/2018   Procedure: ENDARTERECTOMY CAROTID;  Surgeon: Marea Selinda RAMAN, MD;  Location: [facility];  Service: Vascular;  Laterality: Right;   TUBAL LIGATION      Current Medications: Active Medications[1]  Allergies:   Patient has no known allergies.   Social History   Socioeconomic History   Marital status: Widowed    Spouse name: Not on file   Number of children: Not on file   Years of education: Not on file   Highest education level: Not on  file  Occupational History   Occupation: retired  Tobacco Use   Smoking status: Every Day    Current packs/day: 2.00    Average packs/day: 2.0 packs/day for 61.2 years (122.3 ttl pk-yrs)    Types: Cigarettes    Start date: 05/03/1963   Smokeless tobacco: Never   Tobacco comments:    1 PPD- khj 10/10/2023  Vaping Use   Vaping status: Never Used  Substance and Sexual Activity   Alcohol use: Yes    Alcohol/week: 0.0 standard  drinks of alcohol    Comment: [place] occasionally   Drug use: No   Sexual activity: Not on file  Other Topics Concern   Not on file  Social History Narrative   Lives with husband. Has 11 pets in home. Has one son, lives locally.   Was born and raised in [place].   Husband is older than her and she provides care for him.      Work - retired. Invented water bottle for pets   Social Drivers of Health   Tobacco Use: High Risk (04/17/2024)   Patient History    Smoking Tobacco Use: Every Day    Smokeless Tobacco Use: Never    Passive Exposure: Not on file  Financial Resource Strain: Medium Risk (04/13/2021)   Overall Financial Resource Strain (CARDIA)    Difficulty of Paying Living Expenses: Somewhat hard  Food Insecurity: Not on file  Transportation Needs: Not on file  Physical Activity: Not on file  Stress: Not on file  Social Connections: Not on file  Depression (PHQ2-9): Low Risk (03/10/2023)   Depression (PHQ2-9)    PHQ-2 Score: 0  Alcohol Screen: Not on file  Housing: Not on file  Utilities: Not on file  Health Literacy: Not on file     Family History:  The patient's family history includes Cancer in her father and mother; Cancer (age of onset: 18) in her sister; Lung cancer in her sister and sister.  ROS:   12-point review of systems is negative unless otherwise noted in the HPI.   EKGs/Labs/Other Studies Reviewed:    Studies reviewed were summarized above. The additional studies were reviewed today:  LEA ultrasound  09/20/2023: Summary:  Right: Heavy calcification throughout right leg without focal stenosis.  Abnormal CFA waveforms suggest inflow disease.   Left: 75-99% stenosis noted in the superficial femoral artery. Heavy  calcificaton throughout the left leg with focal stenosis seen in the left  mid SFA. Abnormal CFA waveforms suggest inflow disease.  __________   ABI 09/20/2023: ABI/TBIToday's ABIToday's TBIPrevious ABIPrevious TBI  +-------+-----------+-----------+------------+------------+  Right 0.82       0.49       1.14        0.64          +-------+-----------+-----------+------------+------------+  Left  0.83       0.43       1.14        0.61          +-------+-----------+-----------+------------+------------+    Bilateral TBIs and ABIs appear decreased.   See Arterial Duplex study    Summary:  Right: Resting right ankle-brachial index indicates mild right lower  extremity arterial disease. The right toe-brachial index is abnormal.   Left: Resting left ankle-brachial index indicates mild left lower  extremity arterial disease. The left toe-brachial index is abnormal.  __________   2D echo 05/16/2023: 1. Left ventricular ejection fraction, by estimation, is 60 to 65%. The  left ventricle has normal function. The left ventricle has no regional  wall motion abnormalities. There is mild left ventricular hypertrophy.  Left ventricular diastolic parameters  are indeterminate.   2. Right ventricular systolic function is normal. The right ventricular  size is normal. There is mildly elevated pulmonary artery systolic  pressure. The estimated right ventricular systolic pressure is 41.1 mmHg.   3. Left atrial size was moderately dilated.   4. Right atrial size was moderately dilated.   5. The mitral valve is normal in structure. Mild to moderate mitral valve  regurgitation. No evidence of mitral stenosis.   6. Tricuspid valve regurgitation is mild to moderate.   7.  The aortic valve is calcified. There is moderate calcification of the  aortic valve. Aortic valve regurgitation is mild. Moderate aortic valve  stenosis. Aortic valve area, by VTI measures 0.85 cm. Aortic valve mean  gradient measures 19.0 mmHg.   8. There is mild dilatation of the ascending aorta, measuring 40 mm.   9. The inferior vena cava is dilated in size with >50% respiratory  variability, suggesting right atrial pressure of 8 mmHg.  __________   Zio patch 05/2022: Atrial Fibrillation occurred continuously (100% burden), ranging from 42-189 bpm (avg of 91 bpm).  Rare PVCs. __________   2D echo 05/13/2022: 1. Left ventricular ejection fraction, by estimation, is 60 to 65%. The  left ventricle has normal function. The left ventricle has no regional  wall motion abnormalities. There is mild left ventricular hypertrophy.  Left ventricular diastolic parameters  are indeterminate.   2. Right ventricular systolic function is normal. The right ventricular  size is normal. There is mildly elevated pulmonary artery systolic  pressure. The estimated right ventricular systolic pressure is 37.7 mmHg.   3. Left atrial size was moderately dilated.   4. The mitral valve is normal in structure. Mild mitral valve  regurgitation. No evidence of mitral stenosis.   5. The aortic valve is normal in structure. Aortic valve regurgitation is  mild. Moderate aortic valve stenosis. Aortic valve area, by VTI measures  1.20 cm. Aortic valve mean gradient measures 18.0 mmHg. Aortic valve Vmax  measures 2.68 m/s.   6. The inferior vena cava is normal in size with greater than 50%  respiratory variability, suggesting right atrial pressure of 3 mmHg.   Comparison(s): Previous Echo showed LV EF 60-65%, normal LV function, mild  to moderate AS, mean gradient 14 mmHg.  __________   2D echo 06/05/2019: 1. Left ventricular ejection fraction, by visual estimation, is 60 to  65%. The left ventricle has normal  function. There is borderline left  ventricular hypertrophy.   2. Left ventricular diastolic parameters are consistent with Grade II  diastolic dysfunction (pseudonormalization).   3. The left ventricle has no regional wall motion abnormalities.   4. Global right ventricle has normal systolic function.The right  ventricular size is normal. Right vetricular wall thickness was not  assessed.   5. Left atrial size was normal.   6. Right atrial size was normal.   7. The mitral valve is normal in structure. No evidence of mitral valve  regurgitation.   8. The tricuspid valve is normal in structure.   9. The tricuspid valve is normal in structure. Tricuspid valve  regurgitation is mild.  10. The aortic valve is abnormal. Aortic valve regurgitation is not  visualized. Mild to moderate aortic valve stenosis.  11. Peak AV gradient , MG , DVI 0.45, with LVOT diameter of  2cm, AVA 1.4cm2.  12. The pulmonic valve was not well visualized. Pulmonic valve  regurgitation is not visualized.  13. Mildly elevated pulmonary artery systolic pressure.  14. The inferior vena cava is normal in size with greater than 50%  respiratory variability, suggesting right atrial pressure of 3 mmHg.  __________   Zio patch 05/2019: Normal sinus rhythm with an average heart rate of 65 bpm. Sinus bradycardia during early morning hours with lowest heart rate of 43 bpm. Frequent episodes of SVT with a total of 100 runs.  The longest run lasted 50 minutes  and 54 seconds. Intermittent Mobitz 1 second-degree AV block and early morning hours. Most triggered events correlated with SVT.   EKG:  EKG is declined by the patient today.    Recent Labs: No results found for requested labs within last 365 days.  Recent Lipid Panel    Component Value Date/Time   CHOL 137 07/12/2021 1400   TRIG 92.0 07/12/2021 1400   HDL 44.70 07/12/2021 1400   CHOLHDL 3 07/12/2021 1400   VLDL 18.4 07/12/2021 1400   LDLCALC 74  07/12/2021 1400    PHYSICAL EXAM:    VS:  BP (!) 141/103   Pulse (!) 112   Ht 5' 6 (1.676 m)   Wt 137 lb 6.4 oz (62.3 kg)   SpO2 94%   BMI 22.18 kg/m   BMI: Body mass index is 22.18 kg/m.  Physical Exam Vitals reviewed.  Constitutional:      Appearance: She is well-developed.  HENT:     Head: Normocephalic and atraumatic.  Eyes:     General:        Right eye: No discharge.        Left eye: No discharge.  Cardiovascular:     Rate and Rhythm: Tachycardia present. Rhythm irregularly irregular.     Heart sounds: Normal heart sounds, S1 normal and S2 normal. Heart sounds not distant. No midsystolic click and no opening snap. No murmur heard.    No friction rub.  Pulmonary:     Effort: Pulmonary effort is normal. No respiratory distress.     Breath sounds: Normal breath sounds. No decreased breath sounds, wheezing, rhonchi or rales.  Musculoskeletal:     Cervical back: Normal range of motion.     Right lower leg: No edema.     Left lower leg: No edema.  Skin:    General: Skin is warm and dry.     Nails: There is no clubbing.  Neurological:     Mental Status: She is alert and oriented to person, place, and time.  Psychiatric:        Speech: Speech normal.        Behavior: Behavior normal.        Thought Content: Thought content normal.        Judgment: Judgment normal.     Wt Readings from Last 3 Encounters:  04/17/24 137 lb 6.4 oz (62.3 kg)  02/08/24 136 lb (61.7 kg)  01/25/24 167 lb (75.8 kg)     ASSESSMENT & PLAN:   Persistent A-fib/flutter: She remains in A-fib/flutter with elevated rates in the office today.  She declines EKG for further evaluation.  She remains on Cardizem  CD 360 mg daily.  Unable to escalate rate control therapy any further given refusal of EKG and labs.  Unable to pursue rhythm control strategy due to intermittent missed doses of apixaban .  I again discussed with her once daily options such as rivaroxaban or warfarin and she indicates that  she will reach out to her insurance company regarding coverage.  She has historically declined this.  CHA2DS2-VASc at least 6 (CHF, HTN, age x 2, vascular disease, sex category).  Remains on apixaban  5 mg twice daily and based off most recent available labs does not meet reduced dosing criteria.  No falls or symptoms concerning for bleeding.  See medication management below.  HFpEF: Euvolemic and well compensated.  Weight stable.  Remains on furosemide , currently taking 4 days/week.  Prefers to defer escalation of pharmacotherapy in an effort to minimize venipunctures  in office visit.  She declines labs today.  She has been made aware that we cannot evaluate if her current medications are still safe for her without updated labs.  Coronary artery calcification: No symptoms of chest pain.  Has declined ischemic testing in the past.  Declines EKG today.  Remains on apixaban  in place of aspirin .  Remains on atorvastatin .  Risk stratification is incomplete without updated labs or EKG.  Recommended risk factor modification including complete smoking cessation.  Aortic stenosis: Moderate aortic stenosis with a mean gradient of 19 mmHg and a valve area of 0.85 by echo in 05/2023.  Schedule echo for 05/2024.  History of second-degree AV block type I: Noted during nocturnal hours.  Has declined a sleep study.  No indication for PPM.  PAD with claudication: ABI in 08/2023 showed mild bilateral lower extremity arterial disease.  She is followed by vascular surgery with recommendation to follow-up as directed.  Remains on apixaban  in lieu of aspirin  as outlined above as well as atorvastatin .  HTN: Blood pressure is elevated in the office today.  Not currently checking blood pressure at home as she is frustrated by the length of her blood pressure cuff.  Unable to escalate pharmacotherapy without updated EKG or labs, which she declines.  HLD: LDL 74 in 06/2021.  Remains on atorvastatin  80 mg.  Updated labs  refused.  Ascending thoracic aortic aneurysm: Most recent CT of the chest, performed for lung cancer screening, 08/30/2023 demonstrated a largely stable ascending thoracic aorta measuring 4.3 cm (previously measuring 4.2 cm in 08/2022).  This was again discussed with the patient with recommendation for follow-up imaging in 07/2024 either with lung cancer screening CT, or if this is not performed, annual CTA/MRA of the aorta.  AAA: Abdominal ultrasound in 10/2023 showed the largest measurement of the abdominal aorta measuring 3.5 cm, previously 3.2 cm in 10/2022.  Recommend optimal blood pressure control.  Unable to escalate pharmacotherapy at this time without updated labs.  Ongoing management deferred to vascular surgery.  Carotid artery disease: Ultrasound in 10/2023 demonstrated 1 to 39% right ICA stenosis with 40 to 59% left ICA stenosis.  On apixaban  in place of aspirin  as well as atorvastatin  as outlined above.  Followed by vascular surgery.  COPD with ongoing tobacco use: Likely contributing to progressive shortness of breath.  We are updating an echo as outlined above.  Complete smoking cessation is recommended.  She has previously reported cigarettes will be with me until the day I die.  She is aware of the risks of continued smoking, up to and including death.  Medication management: She again declines EKG and labs.  Without updated EKG and labs we will be unable to continue to refill her medications.  She indicates that she will have labs and EKG done at her PCP's office, indicating these are free there.  Prior to continuing any refills moving forward, recommend clinical staff reviewed chart for updated EKG and labs.  Noncardiac concerns: I have recommended the patient discuss her thickened toenail with her PCP.  Does not appear to be cardiac in etiology.     Disposition: F/u with Dr. Darron or an APP in 6 months.   Medication Adjustments/Labs and Tests Ordered: Current medicines are  reviewed at length with the patient today.  Concerns regarding medicines are outlined above. Medication changes, Labs and Tests ordered today are summarized above and listed in the Patient Instructions accessible in Encounters.   Signed, Bernardino Bring, PA-C 04/17/2024 12:40 PM  [facility] [place] [address] [place], [place] [address] [phone]     [1]  Current Meds  Medication Sig   albuterol  (VENTOLIN  HFA) 108 (90 Base) MCG/ACT inhaler Inhale 2 puffs into the lungs every 6 (six) hours as needed for wheezing or shortness of breath.   atorvastatin  (LIPITOR) 80 MG tablet TAKE 1 TABLET BY MOUTH EVERY DAY   Biotin 5000 MCG CAPS Take by mouth.   cholecalciferol  (VITAMIN D3) 25 MCG (1000 UT) tablet Take 1,000 Units by mouth daily.   diltiazem  (CARDIZEM  CD) 360 MG 24 hr capsule TAKE 1 CAPSULE BY MOUTH EVERY DAY   ELIQUIS  5 MG TABS tablet TAKE 1 TABLET BY MOUTH TWICE A DAY   furosemide  (LASIX ) 20 MG tablet TAKE 1 TABLET (20 MG TOTAL) BY MOUTH DAILY AS NEEDED FOR EDEMA (TAKE AS NEEDED FOR SWELLING OR WEIGHT GAIN GREATER THAN 3 POUNDS OVERNIGHT). TAKE 1 TAB ONCE DAILY FOR 3 DAYS THEN AS NEEDED (Patient taking differently: Take 20 mg by mouth as needed. Normally takes 4 per week)   losartan  (COZAAR ) 50 MG tablet TAKE 1 TABLET BY MOUTH EVERY DAY   Multiple Vitamins-Minerals (CENTRUM ADULTS) TABS Take 1 tablet by mouth daily.   STIOLTO RESPIMAT  2.5-2.5 MCG/ACT AERS INHALE 2 PUFFS BY MOUTH DAILY

## 2024-04-17 ENCOUNTER — Ambulatory Visit: Attending: Physician Assistant | Admitting: Physician Assistant

## 2024-04-17 ENCOUNTER — Encounter: Payer: Self-pay | Admitting: Physician Assistant

## 2024-04-17 VITALS — BP 141/103 | HR 112 | Ht 66.0 in | Wt 137.4 lb

## 2024-04-17 DIAGNOSIS — J449 Chronic obstructive pulmonary disease, unspecified: Secondary | ICD-10-CM | POA: Diagnosis not present

## 2024-04-17 DIAGNOSIS — I739 Peripheral vascular disease, unspecified: Secondary | ICD-10-CM

## 2024-04-17 DIAGNOSIS — I2510 Atherosclerotic heart disease of native coronary artery without angina pectoris: Secondary | ICD-10-CM

## 2024-04-17 DIAGNOSIS — E785 Hyperlipidemia, unspecified: Secondary | ICD-10-CM | POA: Diagnosis not present

## 2024-04-17 DIAGNOSIS — I714 Abdominal aortic aneurysm, without rupture, unspecified: Secondary | ICD-10-CM

## 2024-04-17 DIAGNOSIS — I10 Essential (primary) hypertension: Secondary | ICD-10-CM

## 2024-04-17 DIAGNOSIS — I5032 Chronic diastolic (congestive) heart failure: Secondary | ICD-10-CM | POA: Diagnosis not present

## 2024-04-17 DIAGNOSIS — Z720 Tobacco use: Secondary | ICD-10-CM

## 2024-04-17 DIAGNOSIS — Z8679 Personal history of other diseases of the circulatory system: Secondary | ICD-10-CM | POA: Diagnosis not present

## 2024-04-17 DIAGNOSIS — I7121 Aneurysm of the ascending aorta, without rupture: Secondary | ICD-10-CM

## 2024-04-17 DIAGNOSIS — I6523 Occlusion and stenosis of bilateral carotid arteries: Secondary | ICD-10-CM

## 2024-04-17 DIAGNOSIS — Z79899 Other long term (current) drug therapy: Secondary | ICD-10-CM

## 2024-04-17 DIAGNOSIS — I350 Nonrheumatic aortic (valve) stenosis: Secondary | ICD-10-CM | POA: Diagnosis not present

## 2024-04-17 DIAGNOSIS — I4819 Other persistent atrial fibrillation: Secondary | ICD-10-CM | POA: Diagnosis not present

## 2024-04-17 NOTE — Patient Instructions (Signed)
 Medication Instructions:  Your physician recommends that you continue on your current medications as directed. Please refer to the Current Medication list given to you today.   *If you need a refill on your cardiac medications before your next appointment, please call your pharmacy*  Lab Work: None ordered at this time   Testing/Procedures: Your physician has requested that you have an echocardiogram. Echocardiography is a painless test that uses sound waves to create images of your heart. It provides your doctor with information about the size and shape of your heart and how well your hearts chambers and valves are working.   You may receive an ultrasound enhancing agent through an IV if needed to better visualize your heart during the echo. This procedure takes approximately one hour.  There are no restrictions for this procedure.  This will take place at [address] [facility] [address], [place] [address]  Please note: We ask at that you not bring children with you during ultrasound (echo/ vascular) testing. Due to room size and safety concerns, children are not allowed in the ultrasound rooms during exams. Our front office staff cannot provide observation of children in our lobby area while testing is being conducted. An adult accompanying a patient to their appointment will only be allowed in the ultrasound room at the discretion of the ultrasound technician under special circumstances. We apologize for any inconvenience.  Follow-Up: At [facility], you and your health needs are our priority.  As part of our continuing mission to provide you with exceptional heart care, our providers are all part of one team.  This team includes your primary Cardiologist (physician) and Advanced Practice Providers or APPs (Physician Assistants and Nurse Practitioners) who all work together to provide you with the care you need, when you need it.  Your next appointment:   6  month(s)  Provider:   You may see Deatrice Cage, MD or one of the following Advanced Practice Providers on your designated Care Team:   Lonni Meager, NP Lesley Maffucci, PA-C Bernardino Bring, PA-C Cadence Lovingston, PA-C Tylene Lunch, NP Barnie Hila, NP

## 2024-04-26 ENCOUNTER — Encounter: Payer: Self-pay | Admitting: Family

## 2024-04-26 DIAGNOSIS — R899 Unspecified abnormal finding in specimens from other organs, systems and tissues: Secondary | ICD-10-CM

## 2024-04-30 ENCOUNTER — Ambulatory Visit: Admitting: Family

## 2024-04-30 NOTE — Telephone Encounter (Signed)
 Pt has been scheduled for labs and EKG

## 2024-05-07 ENCOUNTER — Other Ambulatory Visit (INDEPENDENT_AMBULATORY_CARE_PROVIDER_SITE_OTHER)

## 2024-05-07 DIAGNOSIS — R899 Unspecified abnormal finding in specimens from other organs, systems and tissues: Secondary | ICD-10-CM | POA: Diagnosis not present

## 2024-05-07 LAB — CBC WITH DIFFERENTIAL/PLATELET
Basophils Absolute: 0 K/uL (ref 0.0–0.1)
Basophils Relative: 0.5 % (ref 0.0–3.0)
Eosinophils Absolute: 0.1 K/uL (ref 0.0–0.7)
Eosinophils Relative: 1.8 % (ref 0.0–5.0)
HCT: 47 % — ABNORMAL HIGH (ref 36.0–46.0)
Hemoglobin: 15.8 g/dL — ABNORMAL HIGH (ref 12.0–15.0)
Lymphocytes Relative: 24.2 % (ref 12.0–46.0)
Lymphs Abs: 1.9 K/uL (ref 0.7–4.0)
MCHC: 33.5 g/dL (ref 30.0–36.0)
MCV: 93.1 fl (ref 78.0–100.0)
Monocytes Absolute: 0.7 K/uL (ref 0.1–1.0)
Monocytes Relative: 8.7 % (ref 3.0–12.0)
Neutro Abs: 5 K/uL (ref 1.4–7.7)
Neutrophils Relative %: 64.8 % (ref 43.0–77.0)
Platelets: 207 K/uL (ref 150.0–400.0)
RBC: 5.05 Mil/uL (ref 3.87–5.11)
RDW: 14.8 % (ref 11.5–15.5)
WBC: 7.7 K/uL (ref 4.0–10.5)

## 2024-05-07 LAB — COMPREHENSIVE METABOLIC PANEL WITH GFR
ALT: 26 U/L (ref 3–35)
AST: 23 U/L (ref 5–37)
Albumin: 4.5 g/dL (ref 3.5–5.2)
Alkaline Phosphatase: 118 U/L — ABNORMAL HIGH (ref 39–117)
BUN: 20 mg/dL (ref 6–23)
CO2: 30 meq/L (ref 19–32)
Calcium: 9.3 mg/dL (ref 8.4–10.5)
Chloride: 102 meq/L (ref 96–112)
Creatinine, Ser: 1.18 mg/dL (ref 0.40–1.20)
GFR: 45.04 mL/min — ABNORMAL LOW
Glucose, Bld: 90 mg/dL (ref 70–99)
Potassium: 3.8 meq/L (ref 3.5–5.1)
Sodium: 141 meq/L (ref 135–145)
Total Bilirubin: 0.9 mg/dL (ref 0.2–1.2)
Total Protein: 6.6 g/dL (ref 6.0–8.3)

## 2024-05-07 LAB — LIPID PANEL
Cholesterol: 128 mg/dL (ref 28–200)
HDL: 45.5 mg/dL
LDL Cholesterol: 67 mg/dL (ref 10–99)
NonHDL: 82.11
Total CHOL/HDL Ratio: 3
Triglycerides: 74 mg/dL (ref 10.0–149.0)
VLDL: 14.8 mg/dL (ref 0.0–40.0)

## 2024-05-07 LAB — MAGNESIUM: Magnesium: 1.8 mg/dL (ref 1.5–2.5)

## 2024-05-07 LAB — TSH: TSH: 10.44 u[IU]/mL — ABNORMAL HIGH (ref 0.35–5.50)

## 2024-05-16 NOTE — Telephone Encounter (Signed)
 Opened in error

## 2024-05-20 ENCOUNTER — Ambulatory Visit

## 2024-05-20 ENCOUNTER — Ambulatory Visit: Payer: Self-pay | Admitting: Physician Assistant

## 2024-05-20 DIAGNOSIS — I350 Nonrheumatic aortic (valve) stenosis: Secondary | ICD-10-CM

## 2024-05-20 DIAGNOSIS — Z79899 Other long term (current) drug therapy: Secondary | ICD-10-CM

## 2024-05-20 LAB — ECHOCARDIOGRAM COMPLETE
AR max vel: 0.48 cm2
AV Area VTI: 0.45 cm2
AV Area mean vel: 0.46 cm2
AV Mean grad: 26 mmHg
AV Peak grad: 44.4 mmHg
Ao pk vel: 3.33 m/s
P 1/2 time: 97 ms

## 2024-05-21 ENCOUNTER — Ambulatory Visit
Admission: RE | Admit: 2024-05-21 | Discharge: 2024-05-21 | Disposition: A | Discharge status: Home / Self Care | Source: Ambulatory Visit | Attending: Family | Admitting: Family

## 2024-05-21 ENCOUNTER — Ambulatory Visit: Admitting: Family

## 2024-05-21 ENCOUNTER — Encounter: Payer: Self-pay | Admitting: Family

## 2024-05-21 ENCOUNTER — Ambulatory Visit: Payer: Self-pay | Admitting: Family

## 2024-05-21 VITALS — BP 122/74 | HR 87 | Temp 98.6°F | Ht 66.0 in | Wt 138.0 lb

## 2024-05-21 DIAGNOSIS — L608 Other nail disorders: Secondary | ICD-10-CM | POA: Diagnosis not present

## 2024-05-21 DIAGNOSIS — R109 Unspecified abdominal pain: Secondary | ICD-10-CM | POA: Insufficient documentation

## 2024-05-21 DIAGNOSIS — R1031 Right lower quadrant pain: Secondary | ICD-10-CM | POA: Insufficient documentation

## 2024-05-21 DIAGNOSIS — I4811 Longstanding persistent atrial fibrillation: Secondary | ICD-10-CM

## 2024-05-21 DIAGNOSIS — J449 Chronic obstructive pulmonary disease, unspecified: Secondary | ICD-10-CM

## 2024-05-21 LAB — URINALYSIS, ROUTINE W REFLEX MICROSCOPIC
Hgb urine dipstick: NEGATIVE
Leukocytes,Ua: NEGATIVE
Nitrite: NEGATIVE
RBC / HPF: NONE SEEN
Specific Gravity, Urine: 1.02 (ref 1.000–1.030)
Total Protein, Urine: 30 — AB
Urine Glucose: NEGATIVE
Urobilinogen, UA: 1 (ref 0.0–1.0)
pH: 6 (ref 5.0–8.0)

## 2024-05-21 MED ORDER — FUROSEMIDE 20 MG PO TABS: 20.0000 mg | ORAL_TABLET | Freq: Every day | ORAL | Status: DC

## 2024-05-21 MED ORDER — FUROSEMIDE 20 MG PO TABS: 20.0000 mg | ORAL_TABLET | Freq: Every day | ORAL | 3 refills | Status: AC

## 2024-05-21 MED ORDER — ALBUTEROL SULFATE HFA 108 (90 BASE) MCG/ACT IN AERS: 2.0000 | INHALATION_SPRAY | Freq: Four times a day (QID) | RESPIRATORY_TRACT | 2 refills | Status: AC | PRN

## 2024-05-21 MED ORDER — APIXABAN 5 MG PO TABS: 5.0000 mg | ORAL_TABLET | Freq: Two times a day (BID) | ORAL | 4 refills | Status: AC

## 2024-05-21 MED ORDER — ATORVASTATIN CALCIUM 80 MG PO TABS: 80.0000 mg | ORAL_TABLET | Freq: Every day | ORAL | 3 refills | Status: AC

## 2024-05-21 NOTE — Assessment & Plan Note (Addendum)
 EKG shows atrial fibrillation. HR 83.  No significant changes when compared to prior 04/07/2023 Continue apixaban  5 mg twice daily d/t  CHA2DS2-VASc at least 6 . Continue Cardizem  360mg  every day as managed by cardiology.

## 2024-05-21 NOTE — Progress Notes (Signed)
 "  Assessment & Plan:  Pain  Longstanding persistent atrial fibrillation [facility]) Assessment & Plan: EKG shows atrial fibrillation. HR 83.  No significant changes when compared to prior 04/07/2023 Continue apixaban  5 mg twice daily d/t  CHA2DS2-VASc at least 6 . Continue Cardizem  360mg  every day as managed by cardiology.  Orders: -     EKG 12-Lead -     Apixaban ; Take 1 tablet (5 mg total) by mouth 2 (two) times daily.  Dispense: 180 tablet; Refill: 4 -     Basic metabolic panel with GFR -     T3, free -     T4, free -     TSH -     Comprehensive metabolic panel with GFR; Future  Stage 2 moderate COPD by GOLD classification (HCC) -     Albuterol  Sulfate HFA; Inhale 2 puffs into the lungs every 6 (six) hours as needed for wheezing or shortness of breath.  Dispense: 8 g; Refill: 2  Right lower quadrant abdominal pain Assessment & Plan: Afebrile.  Nontoxic in appearance.  Right lower abdominal pain.  Somewhat reproducible on exam.  Appears to have improved over time.  Pending labs, urinalysis, CT abdomen and pelvis to evaluate for diverticulitis, renal stone.  Orders: -     Urinalysis, Routine w reflex microscopic -     CBC with Differential/Platelet -     CT ABDOMEN PELVIS WO CONTRAST; Future  Acquired dysmorphic toenail -     Ambulatory referral to Podiatry  Other orders -     Atorvastatin  Calcium ; Take 1 tablet (80 mg total) by mouth daily.  Dispense: 90 tablet; Refill: 3 -     Furosemide ; Take 1 tablet (20 mg total) by mouth daily.     Return precautions given.   Risks, benefits, and alternatives of the medications and treatment plan prescribed today were discussed, and patient expressed understanding.   Education regarding symptom management and diagnosis given to patient on AVS either electronically or printed.  Return in about 3 months (around 08/19/2024).  Rollene Northern, FNP  Subjective:    Patient ID: Luiz CHRISTELLA Hilda, female    DOB: Jul 07, 1950, 74 y.o.   MRN:  [id number]  CC: ORINE GOGA is a 74 y.o. female who presents today for follow up.   HPI:  She complains of right low back and right side abdominal pain x 10 days, waxes and waned. Pain improved today. She has taken advil with some relief.  It has eased off. Pain aggravated by pushing on the area, an occasional sneeze or cough No increased pain with eating.   No migratory pain, fever, rash, constipation, nausea or vomiting, gross hematuria, dysuria    Denies groin pain,  hip pain.   Denies bleeding , epistaxis.    Echocardiogram updated by cardiology.   normal pump function, normal wall motion, normal right-sided pump function, elevated pressure on the right lower chamber of the heart, dilated upper chambers of the heart, mildly to moderately leaky mitral valve moderate calcification, moderate to severely leaky tricuspid valve mildly leaky aortic valve moderately to severely narrowed aortic valve, and mild enlargement of the ascending aorta.  Advised to take lasix  20mg  daily and repeat bmp in 1 weeks Follow up Bernardino Bring 10/22/23  Previous cardiology 04/17/2024 1 persistent A-fib/flutter.  She declined EKG during office visit.  continued on Cardizem  360mg  every day.  Continued on apixaban  5 mg twice daily d/t  CHA2DS2-VASc at least 6   Smoker  Echocardiogram 05/20/24  normal pump function, normal wall motion, normal right-sided pump function, elevated pressure on the right lower chamber of the heart, dilated upper chambers of the heart, mildly to moderately leaky mitral valve moderate calcification, moderate to severely leaky tricuspid valve mildly leaky aortic valve moderately to severely narrowed aortic valve, and mild enlargement of the ascending aorta.    H/o hysterectomy.  No history of diverticulitis  She request a referral to podiatry for her toenails to be trimmed  Allergies: Patient has no known allergies. Medications Ordered Prior to Encounter[1]  Review of  Systems  Constitutional:  Negative for chills and fever.  Respiratory:  Negative for cough and shortness of breath.   Cardiovascular:  Negative for chest pain and palpitations.  Gastrointestinal:  Positive for abdominal pain (right side , upper). Negative for nausea and vomiting.  Genitourinary:  Negative for dysuria and hematuria.  Musculoskeletal:  Positive for back pain (right mid to low back).      Objective:    BP 122/74   Pulse 87   Temp 98.6 F (37 C) (Oral)   Ht 5' 6 (1.676 m)   Wt 138 lb (62.6 kg)   SpO2 95%   BMI 22.27 kg/m  BP Readings from Last 3 Encounters:  05/21/24 122/74  04/17/24 (!) 141/103  02/08/24 130/86   Wt Readings from Last 3 Encounters:  05/21/24 138 lb (62.6 kg)  04/17/24 137 lb 6.4 oz (62.3 kg)  02/08/24 136 lb (61.7 kg)    Physical Exam Vitals reviewed.  Constitutional:      Appearance: Normal appearance. She is well-developed.  Eyes:     Conjunctiva/sclera: Conjunctivae normal.  Cardiovascular:     Rate and Rhythm: Normal rate and regular rhythm.     Pulses: Normal pulses.     Heart sounds: Normal heart sounds.  Pulmonary:     Effort: Pulmonary effort is normal.     Breath sounds: Normal breath sounds. No wheezing, rhonchi or rales.  Abdominal:     General: Bowel sounds are normal. There is no distension.     Palpations: Abdomen is soft. Abdomen is not rigid. There is no fluid wave or mass.     Tenderness: There is abdominal tenderness in the right lower quadrant. There is no right CVA tenderness, left CVA tenderness, guarding or rebound. Negative signs include Murphy's sign and McBurney's sign.     Comments: Not exquisite discomfort right lower quadrant.  She is not guarding, no rebound. No abdominal pain with deep inspiration. No rash.    Skin:    General: Skin is warm and dry.  Neurological:     Mental Status: She is alert.  Psychiatric:        Speech: Speech normal.        Behavior: Behavior normal.        Thought Content:  Thought content normal.            [1]  Current Outpatient Medications on File Prior to Visit  Medication Sig Dispense Refill   Biotin 5000 MCG CAPS Take by mouth.     cholecalciferol  (VITAMIN D3) 25 MCG (1000 UT) tablet Take 1,000 Units by mouth daily.     diltiazem  (CARDIZEM  CD) 360 MG 24 hr capsule TAKE 1 CAPSULE BY MOUTH EVERY DAY 90 capsule 3   losartan  (COZAAR ) 50 MG tablet TAKE 1 TABLET BY MOUTH EVERY DAY 90 tablet 3   Multiple Vitamins-Minerals (CENTRUM ADULTS) TABS Take 1 tablet by mouth daily. 30 tablet 6   STIOLTO  RESPIMAT 2.5-2.5 MCG/ACT AERS INHALE 2 PUFFS BY MOUTH DAILY 12 each 2   No current facility-administered medications on file prior to visit.   "

## 2024-05-21 NOTE — Patient Instructions (Addendum)
 Start lasix  daily as recommended by cardiology You will need lab in 1 week to check potassium, kidney function.   Labs and urine today.   I have also ordered a CT abdomen.   Please let me know if symptoms were to change or you develop new symptoms.  During his workup,  I want to stay very vigilant  I did also place a referral to podiatry  Let us  know if you dont hear back within 2 weeks in regards to an appointment being scheduled.   So that you are aware, if you are [facility] MyChart user , please pay attention to your MyChart messages as you may receive a MyChart message with a phone number to call and schedule this test/appointment own your own from our referral coordinator. This is a new process so I do not want you to miss this message.  If you are not a MyChart user, you will receive a phone call.

## 2024-05-21 NOTE — Assessment & Plan Note (Addendum)
 Afebrile.  Nontoxic in appearance.  Right lower abdominal pain.  Somewhat reproducible on exam.  Appears to have improved over time.  Pending labs, urinalysis, CT abdomen and pelvis to evaluate for diverticulitis, renal stone.

## 2024-05-22 ENCOUNTER — Telehealth: Payer: Self-pay | Admitting: Family

## 2024-05-22 ENCOUNTER — Other Ambulatory Visit: Payer: Self-pay | Admitting: Physician Assistant

## 2024-05-22 ENCOUNTER — Encounter: Payer: Self-pay | Admitting: Family

## 2024-05-22 LAB — BASIC METABOLIC PANEL WITH GFR
BUN: 17 mg/dL (ref 6–23)
CO2: 29 meq/L (ref 19–32)
Calcium: 9.5 mg/dL (ref 8.4–10.5)
Chloride: 102 meq/L (ref 96–112)
Creatinine, Ser: 1.23 mg/dL — ABNORMAL HIGH (ref 0.40–1.20)
GFR: 42.84 mL/min — ABNORMAL LOW
Glucose, Bld: 84 mg/dL (ref 70–99)
Potassium: 4.1 meq/L (ref 3.5–5.1)
Sodium: 141 meq/L (ref 135–145)

## 2024-05-22 LAB — CBC WITH DIFFERENTIAL/PLATELET
Basophils Absolute: 0.2 K/uL — ABNORMAL HIGH (ref 0.0–0.1)
Basophils Relative: 2.1 % (ref 0.0–3.0)
Eosinophils Absolute: 0.1 K/uL (ref 0.0–0.7)
Eosinophils Relative: 1.8 % (ref 0.0–5.0)
HCT: 44.2 % (ref 36.0–46.0)
Hemoglobin: 15.2 g/dL — ABNORMAL HIGH (ref 12.0–15.0)
Lymphocytes Relative: 24.6 % (ref 12.0–46.0)
Lymphs Abs: 2 K/uL (ref 0.7–4.0)
MCHC: 34.3 g/dL (ref 30.0–36.0)
MCV: 92.7 fl (ref 78.0–100.0)
Monocytes Absolute: 0.6 K/uL (ref 0.1–1.0)
Monocytes Relative: 7.6 % (ref 3.0–12.0)
Neutro Abs: 5.2 K/uL (ref 1.4–7.7)
Neutrophils Relative %: 63.9 % (ref 43.0–77.0)
Platelets: 202 K/uL (ref 150.0–400.0)
RBC: 4.77 Mil/uL (ref 3.87–5.11)
RDW: 14.7 % (ref 11.5–15.5)
WBC: 8.1 K/uL (ref 4.0–10.5)

## 2024-05-22 LAB — T3, FREE: T3, Free: 2.9 pg/mL (ref 2.3–4.2)

## 2024-05-22 LAB — TSH: TSH: 5.59 u[IU]/mL — ABNORMAL HIGH (ref 0.35–5.50)

## 2024-05-22 LAB — T4, FREE: Free T4: 0.94 ng/dL (ref 0.60–1.60)

## 2024-05-22 NOTE — Telephone Encounter (Signed)
 Second call note 05/22/24  Please ensure pt has read TWO mychart messages; copied 2nd below.  Please let me know if she is okay with my ordering MRI abdomen liver protocol     I am sorry to bother you so much this morning.  Please respond to my first MyChart message.    I do want to ensure that you do not have any fall or trauma to the right side.  I was able to have a very helpful conversation with the radiologist this morning.  I  t looks like it is a vascular lesion close to the liver, possibly from an injury on your right side which would be the reason for your pain.  Radiology recommended an MRI with contrast, liver protocol.  Contrast with MRI does not affect  kidney function and is the safest course of action in the setting of chronic kidney disease.     Please confirm that you do not have any metal in your body, a pacemaker and I will arrange MRI abdomen.   My regards,  Rollene

## 2024-05-22 NOTE — Telephone Encounter (Signed)
" ° °  Call pt  CT a/p abdomen/pelvis showing a nonspecific area on the right-hand side of abdomen, correlating with pain.    Please call patient and ask if she has had any recent fall or trauma?    Radiology did not see any lower right rib fractures  or definite diverticulitis.   Calcified gallstone present however I do not think etiology for pain.   Appendix is also normal.  No pelvic mass  Evidence of severe vascular disease, aortic aneurysm.  Please ensure that you keep follow-up with vascular this month  Please let patient know that I reached out to gastroenterology for advice     I did not use IV contrast yesterday due to chronic kidney disease .  IV contrast would have been useful to delineate unfortunately.  We may have to proceed with MRI.  Please confirm NO pacemaker or metal in your body.  Please asked patient to stay incredibly vigilant and let me know if any new symptoms or signs until we have  more information.    "

## 2024-05-23 ENCOUNTER — Encounter (INDEPENDENT_AMBULATORY_CARE_PROVIDER_SITE_OTHER): Payer: Self-pay | Admitting: Vascular Surgery

## 2024-05-23 ENCOUNTER — Ambulatory Visit: Payer: Self-pay | Admitting: Family

## 2024-05-23 DIAGNOSIS — N189 Chronic kidney disease, unspecified: Secondary | ICD-10-CM

## 2024-05-23 DIAGNOSIS — E038 Other specified hypothyroidism: Secondary | ICD-10-CM

## 2024-05-24 NOTE — Telephone Encounter (Signed)
 Noted She declines MRI ab, liver protocol

## 2024-05-24 NOTE — Telephone Encounter (Signed)
 Spoke to pt she did not have a fall and does not need an MRI at this time

## 2024-05-27 ENCOUNTER — Encounter (INDEPENDENT_AMBULATORY_CARE_PROVIDER_SITE_OTHER)

## 2024-05-27 ENCOUNTER — Ambulatory Visit (INDEPENDENT_AMBULATORY_CARE_PROVIDER_SITE_OTHER): Admitting: Vascular Surgery

## 2024-06-03 ENCOUNTER — Encounter: Payer: Self-pay | Admitting: Cardiovascular Disease

## 2024-06-07 ENCOUNTER — Ambulatory Visit: Admitting: Physician Assistant

## 2024-08-20 ENCOUNTER — Other Ambulatory Visit

## 2024-08-27 ENCOUNTER — Ambulatory Visit: Admitting: Family

## 2024-10-07 ENCOUNTER — Ambulatory Visit (INDEPENDENT_AMBULATORY_CARE_PROVIDER_SITE_OTHER): Admitting: Nurse Practitioner

## 2024-10-07 ENCOUNTER — Other Ambulatory Visit (INDEPENDENT_AMBULATORY_CARE_PROVIDER_SITE_OTHER)

## 2024-10-07 ENCOUNTER — Encounter (INDEPENDENT_AMBULATORY_CARE_PROVIDER_SITE_OTHER)

## 2024-10-21 ENCOUNTER — Ambulatory Visit: Admitting: Physician Assistant
